# Patient Record
Sex: Female | Born: 1940 | Race: White | Hispanic: No | Smoking: Never smoker
Health system: Southern US, Community
[De-identification: ages and names within clinical notes are randomized; demographics above are authoritative.]

## PROBLEM LIST (undated history)

## (undated) DIAGNOSIS — K449 Diaphragmatic hernia without obstruction or gangrene: Secondary | ICD-10-CM

## (undated) DIAGNOSIS — I214 Non-ST elevation (NSTEMI) myocardial infarction: Secondary | ICD-10-CM

## (undated) DIAGNOSIS — M797 Fibromyalgia: Secondary | ICD-10-CM

## (undated) DIAGNOSIS — G4730 Sleep apnea, unspecified: Secondary | ICD-10-CM

## (undated) DIAGNOSIS — E669 Obesity, unspecified: Secondary | ICD-10-CM

## (undated) DIAGNOSIS — B029 Zoster without complications: Secondary | ICD-10-CM

## (undated) DIAGNOSIS — I509 Heart failure, unspecified: Secondary | ICD-10-CM

## (undated) DIAGNOSIS — D649 Anemia, unspecified: Secondary | ICD-10-CM

## (undated) DIAGNOSIS — J189 Pneumonia, unspecified organism: Secondary | ICD-10-CM

## (undated) DIAGNOSIS — I10 Essential (primary) hypertension: Secondary | ICD-10-CM

## (undated) DIAGNOSIS — I499 Cardiac arrhythmia, unspecified: Secondary | ICD-10-CM

## (undated) DIAGNOSIS — N289 Disorder of kidney and ureter, unspecified: Secondary | ICD-10-CM

## (undated) DIAGNOSIS — E785 Hyperlipidemia, unspecified: Secondary | ICD-10-CM

## (undated) DIAGNOSIS — F419 Anxiety disorder, unspecified: Secondary | ICD-10-CM

## (undated) DIAGNOSIS — K644 Residual hemorrhoidal skin tags: Secondary | ICD-10-CM

## (undated) DIAGNOSIS — G2581 Restless legs syndrome: Secondary | ICD-10-CM

## (undated) DIAGNOSIS — I2510 Atherosclerotic heart disease of native coronary artery without angina pectoris: Secondary | ICD-10-CM

## (undated) HISTORY — DX: Residual hemorrhoidal skin tags: K64.4

## (undated) HISTORY — PX: APPENDECTOMY: SHX54

## (undated) HISTORY — PX: FRACTURE SURGERY: SHX138

## (undated) HISTORY — DX: Cardiac arrhythmia, unspecified: I49.9

## (undated) HISTORY — PX: OTHER SURGICAL HISTORY: SHX169

---

## 1997-09-27 ENCOUNTER — Emergency Department (HOSPITAL_COMMUNITY): Admission: EM | Admit: 1997-09-27 | Discharge: 1997-09-27 | Payer: Self-pay | Admitting: Emergency Medicine

## 1998-04-28 ENCOUNTER — Emergency Department (HOSPITAL_COMMUNITY): Admission: EM | Admit: 1998-04-28 | Discharge: 1998-04-28 | Payer: Self-pay | Admitting: Emergency Medicine

## 1999-12-31 ENCOUNTER — Emergency Department (HOSPITAL_COMMUNITY): Admission: EM | Admit: 1999-12-31 | Discharge: 1999-12-31 | Payer: Self-pay | Admitting: Emergency Medicine

## 1999-12-31 ENCOUNTER — Encounter: Payer: Self-pay | Admitting: Emergency Medicine

## 2000-03-23 ENCOUNTER — Encounter: Payer: Self-pay | Admitting: Emergency Medicine

## 2000-03-23 ENCOUNTER — Emergency Department (HOSPITAL_COMMUNITY): Admission: EM | Admit: 2000-03-23 | Discharge: 2000-03-23 | Payer: Self-pay | Admitting: Emergency Medicine

## 2000-03-28 ENCOUNTER — Ambulatory Visit (HOSPITAL_BASED_OUTPATIENT_CLINIC_OR_DEPARTMENT_OTHER): Admission: RE | Admit: 2000-03-28 | Discharge: 2000-03-28 | Payer: Self-pay | Admitting: Orthopedic Surgery

## 2001-06-03 ENCOUNTER — Emergency Department (HOSPITAL_COMMUNITY): Admission: EM | Admit: 2001-06-03 | Discharge: 2001-06-03 | Payer: Self-pay | Admitting: Emergency Medicine

## 2002-02-05 ENCOUNTER — Inpatient Hospital Stay (HOSPITAL_COMMUNITY): Admission: EM | Admit: 2002-02-05 | Discharge: 2002-02-06 | Payer: Self-pay | Admitting: *Deleted

## 2002-02-06 ENCOUNTER — Encounter: Payer: Self-pay | Admitting: Cardiology

## 2002-04-27 ENCOUNTER — Emergency Department (HOSPITAL_COMMUNITY): Admission: EM | Admit: 2002-04-27 | Discharge: 2002-04-27 | Payer: Self-pay | Admitting: Emergency Medicine

## 2002-07-25 ENCOUNTER — Encounter: Payer: Self-pay | Admitting: Unknown Physician Specialty

## 2002-07-25 ENCOUNTER — Encounter: Payer: Self-pay | Admitting: Family Medicine

## 2002-07-25 ENCOUNTER — Ambulatory Visit (HOSPITAL_COMMUNITY): Admission: RE | Admit: 2002-07-25 | Discharge: 2002-07-25 | Payer: Self-pay | Admitting: Unknown Physician Specialty

## 2002-07-26 ENCOUNTER — Inpatient Hospital Stay (HOSPITAL_COMMUNITY): Admission: EM | Admit: 2002-07-26 | Discharge: 2002-07-28 | Payer: Self-pay | Admitting: Emergency Medicine

## 2002-07-26 ENCOUNTER — Encounter: Payer: Self-pay | Admitting: Emergency Medicine

## 2002-07-28 ENCOUNTER — Encounter: Payer: Self-pay | Admitting: Internal Medicine

## 2003-03-28 ENCOUNTER — Encounter: Payer: Self-pay | Admitting: Emergency Medicine

## 2003-03-28 ENCOUNTER — Emergency Department (HOSPITAL_COMMUNITY): Admission: EM | Admit: 2003-03-28 | Discharge: 2003-03-28 | Payer: Self-pay | Admitting: *Deleted

## 2004-05-03 ENCOUNTER — Ambulatory Visit: Payer: Self-pay | Admitting: Family Medicine

## 2004-06-01 ENCOUNTER — Ambulatory Visit: Payer: Self-pay | Admitting: Family Medicine

## 2004-07-12 ENCOUNTER — Ambulatory Visit: Payer: Self-pay | Admitting: Family Medicine

## 2004-08-04 ENCOUNTER — Ambulatory Visit: Payer: Self-pay | Admitting: Family Medicine

## 2004-10-17 ENCOUNTER — Ambulatory Visit: Payer: Self-pay | Admitting: Family Medicine

## 2005-01-19 ENCOUNTER — Ambulatory Visit: Payer: Self-pay | Admitting: Family Medicine

## 2005-03-28 ENCOUNTER — Ambulatory Visit: Payer: Self-pay | Admitting: Family Medicine

## 2005-04-11 ENCOUNTER — Ambulatory Visit: Payer: Self-pay | Admitting: Family Medicine

## 2005-06-08 ENCOUNTER — Ambulatory Visit: Payer: Self-pay | Admitting: Family Medicine

## 2005-07-04 ENCOUNTER — Ambulatory Visit: Payer: Self-pay | Admitting: Family Medicine

## 2005-08-23 ENCOUNTER — Ambulatory Visit: Payer: Self-pay | Admitting: Family Medicine

## 2005-09-18 ENCOUNTER — Emergency Department (HOSPITAL_COMMUNITY): Admission: EM | Admit: 2005-09-18 | Discharge: 2005-09-18 | Payer: Self-pay | Admitting: Emergency Medicine

## 2005-10-19 ENCOUNTER — Ambulatory Visit: Payer: Self-pay | Admitting: Family Medicine

## 2005-11-02 ENCOUNTER — Ambulatory Visit: Payer: Self-pay | Admitting: Family Medicine

## 2005-11-03 ENCOUNTER — Ambulatory Visit: Payer: Self-pay | Admitting: Family Medicine

## 2006-03-02 ENCOUNTER — Emergency Department (HOSPITAL_COMMUNITY): Admission: EM | Admit: 2006-03-02 | Discharge: 2006-03-02 | Payer: Self-pay | Admitting: Emergency Medicine

## 2006-03-05 ENCOUNTER — Ambulatory Visit: Payer: Self-pay | Admitting: Family Medicine

## 2006-04-04 ENCOUNTER — Ambulatory Visit: Payer: Self-pay | Admitting: Family Medicine

## 2006-04-17 ENCOUNTER — Ambulatory Visit: Payer: Self-pay | Admitting: Family Medicine

## 2006-07-04 ENCOUNTER — Ambulatory Visit: Payer: Self-pay | Admitting: Family Medicine

## 2006-07-10 ENCOUNTER — Ambulatory Visit: Payer: Self-pay | Admitting: Family Medicine

## 2006-10-08 ENCOUNTER — Ambulatory Visit: Payer: Self-pay | Admitting: Family Medicine

## 2006-10-16 ENCOUNTER — Ambulatory Visit: Payer: Self-pay | Admitting: Family Medicine

## 2007-09-09 ENCOUNTER — Ambulatory Visit: Payer: Self-pay | Admitting: Cardiology

## 2007-09-17 ENCOUNTER — Ambulatory Visit: Payer: Self-pay

## 2007-09-17 ENCOUNTER — Ambulatory Visit: Payer: Self-pay | Admitting: Cardiology

## 2007-09-19 ENCOUNTER — Ambulatory Visit: Payer: Self-pay

## 2007-10-23 ENCOUNTER — Ambulatory Visit: Payer: Self-pay | Admitting: Cardiology

## 2009-06-19 ENCOUNTER — Emergency Department (HOSPITAL_COMMUNITY): Admission: EM | Admit: 2009-06-19 | Discharge: 2009-06-19 | Payer: Self-pay | Admitting: Emergency Medicine

## 2010-08-21 LAB — GLUCOSE, CAPILLARY: Glucose-Capillary: 173 mg/dL — ABNORMAL HIGH (ref 70–99)

## 2010-10-18 NOTE — Assessment & Plan Note (Signed)
[facility]                          [facility] OFF[facility] NOTE   NAME:COBBShavy, Beachem                          MRN:          [id number]  DATE:09/09/2007                            DOB:          07-14-1940    PRIMARY CARE PHYSICIAN:  Dr. Ardeen Garland   REASON FOR CONSULTATION:  Evaluate patient with chest pain and an  abnormally EKG.   HISTORY OF PRESENT ILLNESS:  This patient is a pleasant 70 year old  white female without prior cardiac history.  She has significant  cardiovascular risk factors as described below.  She recently saw Dr.  Lysbeth Galas and was noted to have an EKG which was irregular.  There was  baseline artifact.  This makes it somewhat difficult to interpret.  The  computer interpretation was atrial fibrillation.  However, review of  this suggests that there may be some underlying P waves that are  somewhat difficult to see.   The patient says she feels palpitations occasionally, but she cannot be  very specific about this.  She does say that when she climbs up stairs  she will notice her heart racing.  This is not new.  She has no  presyncope or syncope.  She has had no past erythema history.   Of concern, the patient also describes chest and back discomfort.  She  says that this happens when she does activities such as vacuuming.  This  is the most exerting thing she does.  She will get severe discomfort in  her back.  This may last for an hour.  She has to go and sit down until  this goes away.  She may have some left shoulder discomfort.  She has  some fleeting sharp chest pain that is not reproducible with this  activity.  She gets short of breath with this activity.  She is morbidly  obese and has a relatively low activity tolerance.  She does not  describe any PND or orthopnea.  She does not have any associated nausea,  vomiting, or diaphoresis.   PAST MEDICAL HISTORY:  1. Non-insulin-dependent diabetes mellitus x20 years.  2.  Hypertension x20 years.  3. Hypothyroid.  4. Dyslipidemia.   PAST SURGICAL HISTORY:  Appendectomy.   ALLERGIES:  MOTRIN.   MEDICATIONS:  1. Glipizide 10 mg daily.  2. Amitriptyline 10 mg daily.  3. Levothyroxine 25 mcg daily.  4. Lasix 20 mg daily.  5. Januvia 100 mg daily.  6. Avandia 8 mg daily.  7. Lisinopril 20 mg daily.  8. Metformin 1000 mg q. a.m. and q. p.m.  9. Crestor 20 mg q. h.s.  10.Nasacort.  11.Aspirin 81 mg daily.   SOCIAL HISTORY:  The patient comes today with her son.  She has never  smoked cigarettes.  She does not drink alcohol.   FAMILY HISTORY:  Noncontributory for early coronary artery disease.  There is one sister with a skipping heartbeat.   REVIEW OF SYSTEMS:  As stated in the HPI and otherwise positive for  hiatal hernia.  Negative for all other review of systems.   PHYSICAL  EXAMINATION:  Patient is in no acute distress.  Blood pressure 144/82, heart rate 82 and regular, weight 243 pounds.  HEENT:  Eyes unremarkable.  Pupils are equal, round and reactive to  light.  Fundi not visualized.  Oral mucosa unremarkable.  NECK:  No jugular venous distention, 45 degrees.  Carotid upstroke brisk  and symmetrical.  No bruits, thyromegaly.  LYMPHATICS:  No cervical, axillary inguinal adenopathy.  LUNGS:  Clear to auscultation bilaterally.  BACK:  No costovertebral angle tenderness.  CHEST:  Unremarkable.  HEART:  PMI not displaced or sustained.  S1, S2 within normal limits.  No S3, no S4.  No clicks, no rubs, 2 out of 6 apical systolic murmur  slightly radiating at the aortic outflow tract and early peaking.  No  diastolic murmurs.  ABDOMEN:  Morbidly obese.  Positive bowel sounds.  Normal frequency,  pitch.  No bruits, no rebound, no guarding.  No midline pulsatile mass.  No hepatosplenomegaly, splenomegaly.  SKIN:  No rashes, no nodules.  EXTREMITIES:  Two plus pulses throughout.  No edema, no cyanosis, no  clubbing.  NEUROLOGIC:  Oriented to  person, place, time.  Cranial nerves II through  XII are grossly intact.  Motor grossly intact.   EKG:  Sinus rhythm with premature atrial contractions, right bundle-  branch block, no acute ST wave change.   ASSESSMENT AND PLAN:  1. Abnormal electrocardiogram.  The electrocardiogram sent by Dr.      Lysbeth Galas has an irregular rhythm.  However, I do believe there are P      waves and that this is not atrial fibrillation.  There is baseline      artifact which makes it a little bit to interpret.  It is clear on      today's electrocardiogram that she has irregular rhythm with      frequentpremature atrial contractions.  I am going to place a 48      hour Holter monitor to further evaluate for any dysrhythmia before      making any decision about further therapy such as rate control or      anticoagulation.  2. Back discomfort and arm discomfort.  This is quite concerning for      an anginal equivalent in this patient with longstanding      cardiovascular risk factors.  Therefore, she needs a stress test.      She would not be able to walk on a treadmill.  She will need an      adenosine Cardiolite.  Further evaluation based on these results.  3. Hypertension.  Blood pressure is well controlled.  She will      continue the medications as listed.  4. Obesity.  We will discuss further the need to lose weight with diet      and exercise.  5. Diabetes per Dr. Lysbeth Galas.  6. Dyslipidemia.  I think the goal should be an LDL less than 70 and      HDL greater than 50 in this female with significant cardiovascular      risk factors.  She was close to these goals at the last check by      Dr. Lysbeth Galas and is getting excellent primary prevention.  7. Followup.  I will see her back in my office in [place] to follow up      these results.     Rollene Rotunda, MD, [facility]  Electronically Signed    JH/MedQ  DD: 09/09/2007  DT: 09/09/2007  Job #: [id number]   cc:   Delaney Meigs, M.D.

## 2010-10-18 NOTE — Assessment & Plan Note (Signed)
[facility]                            CARDIOLOGY OFFICE NOTE   NAME:[name], [name]                          MRN:          [id number]  DATE:10/23/2007                            DOB:          24-Jun-1940    PRIMARY CARE PHYSICIAN:  Delaney Meigs, M.D.   REASON FOR PRESENTATION:  Evaluate patient with arrhythmia and  hypertension.   HISTORY OF PRESENT ILLNESS:  The patient is a 70 year old white female.  I saw her on September 09, 2007 for evaluation of chest discomfort and  palpitations.  She had a stress perfusion study on September 17, 2007 which  demonstrated no evidence of ischemia or scar with an EF of 76%.  She did  wear a Holter monitor.  This did demonstrate sinus rhythm predominately  with short paroxysms of atrial fibrillation.   The patient says she does still occasionally feel these palpitations,  though they are not sustained.  Of note on her monitor, she also has  frequent premature atrial ectopy and sinus tachycardia.  It is not  exactly clear what she is feeling.  She has had no presyncope or  syncope.  She continues to get this fleeting chest discomfort in her  left upper chest.  This happens sporadically.  It is not associated with  palpitations.  She also complains of some discomfort occasionally in her  left leg in her thigh above her knee.  This again is fleeting and not  associated with walking.  She does not describe any new shortness of  breath and has no PND or orthopnea.   PAST MEDICAL HISTORY:  1. Non-insulin-dependent diabetes mellitus x 20 years.  2. Hypertension x 20 years.  3. Hypothyroidism.  4. Dyslipidemia.  5. Appendectomy.   ALLERGIES/INTOLERANCES:  MOTRIN.   MEDICATIONS:  1. Glipizide 10 mg daily.  2. Amitriptyline 10 mg daily.  3. Levothyroxine 25 mcg daily.  4. Lasix 20 mg daily.  5. Januvia 100 mg daily.  6. Lisinopril 20 mg daily.  7. Metformin 1000 mg q.a.m. and q.p.m.  8. Crestor 20 mg q.h.s.  9.  Nasacort.  10.Aspirin 81 mg daily.  11.Avandia 3 mg daily.  12.Fiber therapy.   REVIEW OF SYSTEMS:  As stated in the HPI and otherwise positive for her  blood glucose dropping occasionally at night.  Negative for all other  systems.   PHYSICAL EXAMINATION:  GENERAL:  The patient is in no distress.  VITAL SIGNS:  Blood pressure 172/74, heart rate 80 and regular, weight  251 pounds, body mass index 45.  HEENT:  Eyelids are unremarkable.  Pupils are equal, round, reactive to  light.  Fundi not visualized.  Oral mucosa unremarkable, edentulous.  NECK:  No jugular venous distention at 45 degrees.  Carotid upstroke  brisk and symmetrical.  No bruits, no thyromegaly.  LYMPHATICS:  No cervical, axillary, inguinal adenopathy.  LUNGS:  Clear to auscultation bilaterally.  BACK:  No costovertebral angle tenderness.  CHEST:  Unremarkable.  HEART:  PMI not displaced or sustained, S1-S2 within normal limits.  No  S3-S4, no clicks, rubs,  2/6 apical systolic murmur slightly radiating  out the aortic outflow tract and early peaking, no diastolic murmurs.  ABDOMEN:  Morbidly obese, positive bowel sounds normal in frequency and  pitch, no bruits, rebound, guarding, no midline pulsatile mass.  No  hepatomegaly.  No splenomegaly.  SKIN:  No rashes, no nodules.  EXTREMITIES:  2+ pulses upper, 2+ femorals, 1+ dorsalis pedis and  posterior tibialis bilaterally, no cyanosis, no clubbing, no edema.  NEURO:  Oriented to person, place, time, cranial nerves II-XII are  grossly intact, motor grossly intact.   ASSESSMENT/PLAN:  1. Chest:  The patient's chest discomfort is somewhat atypical and she      has a negative stress perfusion study.  No further cardiovascular      testing is suggested at this point.  2. Arrhythmia:  The patient did have short runs of paroxysmal atrial      fibrillation.  Her [place] score is 2.  There would be no indication      for Coumadin at this point.  I have asked her to take a full  325 mg      aspirin.  3. Hypertension:  Her blood pressure is still not well controlled.      She needs weight loss and therapeutic lifestyle changes (TLC).  I      am going to increase her lisinopril to 40 mg daily.  She is given      written instructions to get a BMET in 2 weeks.  She does have      follow up with Dr. Lysbeth Galas for management of this.  4. Diabetes:  She is due to see him soon and will discuss the low      blood sugars at night.  5.  Dyslipidemia:  I think the goal should      be an LDL less than 100, HDL greater than 50.  I will defer      management to Dr. Lysbeth Galas.  5. Obesity:  The patient understands the need to lose weight with diet      and exercise.   FOLLOW UP:  I can see the patient back in 1 year or sooner if she has  any further dysrhythmias.  I will need to keep reassessing her for  Coumadin indications.     Rollene Rotunda, MD, [facility]  Electronically Signed    JH/MedQ  DD: 10/23/2007  DT: 10/23/2007  Job #: [id number]   cc:   Delaney Meigs, M.D.

## 2010-10-21 NOTE — Discharge Summary (Signed)
[name], [name]                             ACCOUNT NO.:  [id number]   MEDICAL RECORD NO.:  [id number]                   PATIENT TYPE:  INP   LOCATION:  5511                                 FACILITY:  [facility]   PHYSICIAN:  Margaretmary Bayley, M.D.                 DATE OF BIRTH:  24-Nov-1940   DATE OF ADMISSION:  07/26/2002  DATE OF DISCHARGE:  07/28/2002                                 DISCHARGE SUMMARY   DISCHARGE DIAGNOSES:  1. Insulin-requiring type 2 diabetes mellitus.  2. Hypoglycemia.   REASON FOR ADMISSION:  The patient is a 70 year old type 2 diabetic patient  of Dr. Dewaine Conger, who was seen in the emergency room after being transported by  ambulance because of several episodes of hypoglycemia within the last 24  hours.  The patient had been seen initially in the office for poorly-  controlled type 2 diabetes mellitus, and at that time Lantus insulin had  been added to her regimen of metformin and Glucotrol to improve her diabetic  control.  According to the staff in the emergency room, the patient despite  registering a blood sugar of less than 100 gave herself more Lantus insulin  in response to these findings, and it was felt that she needed to be  admitted for further observation and diabetic education.   PHYSICAL EXAMINATION:  VITAL SIGNS:  Her blood pressure was 142/64 with a  pulse rate of 76, respiratory rate of 20, and she was afebrile at 98.2.  HEENT:  Remarkable for the absence of any significant diabetic changes in  her retina.  The sclerae were anicteric.  No conjunctival pallor.  NECK:  Supple, no adenopathy or thyromegaly.  CHEST:  She had no splinting, tenderness, or deformities, and the lungs were  clear.  CARDIAC:  She did have a right carotid artery bruit.  EXTREMITIES:  There was an abrasion over the left knee anteriorly but no  joint effusions were noted.  NEUROLOGIC:  She was alert and oriented, cooperative, without any focal  deficits.   LABORATORY DATA:   Her EKG revealed a normal sinus rhythm, no deviation of  the electrical axis.  She had a right bundle branch block but no acute  changes.  Her admission chest x-ray revealed mild vascular calcifications in  the area of the coronary arteries and aorta, no focal infiltrates, and no  changes suggestive of pulmonary vascular congestion were noted.   White count was elevated at 10,800 with a hematocrit of 36.  Comprehensive  metabolic panel is normal except for a glucose that was low at 51.  Albumin  is slightly low at 3.1.  TSH is normal at 4.6.  Cortisol is normal at 10.1.  Urinalysis is completely unremarkable except for a glucose that was greater  than 1000 mg%.  A urine culture revealed no significant findings.   HOSPITAL COURSE:  The patient after  being treated with IV D50 en route and  after being seen in the emergency room, she was admitted to a telemetry bed  and started back on her outpatient medications, which included Ecotrin 325  mg per day, Monopril 10 mg per day, Protonix 40 mg per day.  She was not  started back on any oral agents, and CBGs were performed before meals and at  bedtime.  She had no further complications of hypoglycemia.  Her highest  blood sugar was 172 pre-lunch with a fasting of 99.  Because of an abnormal  abdominal ultrasound performed and requested by Dr. Dewaine Conger prior to her  admission, which revealed a question of dilated common ducts, a CT scan of  the abdomen was performed but revealed no abnormalities and specifically  there was no dilated common bile duct.  No masses were noted.  The patient  had no further problems or complications in the hospital and was discharged  after a two-day stay.   DISPOSITION:  She was discharged without any Lantus insulin.  She was  instructed on the use of Glucotrol XL 2.5 mg daily along with metformin 500  mg b.i.d.  She is scheduled to follow up with Dr. Dewaine Conger in one week, and  she is to be seen by me in two weeks.   She is instructed on the importance  of checking her fasting blood sugars daily and to notify either me or Dr.  Dewaine Conger if her blood sugars were less than 80 fasting.  She is discharged on  a 1500 calorie high-fiber complex carbohydrate diet with no added and no  concentrated sweets, and she is to be ambulatory as tolerated.   PROGNOSIS:  Considered to be fair.                                               Margaretmary Bayley, M.D.    PC/MEDQ  D:  09/04/2002  T:  09/05/2002  Job:  [id number]

## 2010-10-21 NOTE — Discharge Summary (Signed)
[name], [name] NO.:  [id number]   MEDICAL RECORD NO.:  [id number]                   PATIENT TYPE:  INP   LOCATION:  2008                                 FACILITY:  [facility]   PHYSICIAN:  [name], P.A. [facility]             DATE OF BIRTH:  [birth year]   DATE OF ADMISSION:  02/05/2002  DATE OF DISCHARGE:  02/06/2002                                 DISCHARGE SUMMARY   REASON. FOR ADMISSION:  Chest pain.   DISCHARGE DIAGNOSES:  1. Chest pain, etiology unclear.  Evaluated this admission with serial     enzymes which were negative and EKG which was not diagnostic for     ischemia.  Adenosine Cardiolite conducted; normal ejection fraction, no     ischemia seen.  2. Diabetes mellitus, marginal control, hemoglobin A1c is 8.9.  3. Cardiac risk factor modification, fasting lipids pending at time of     discharge.  4. Right bundle branch block on EKG for several years.  5. Thyroid dysfunction.  6. Hypertension.  7. Chronic lower back pain.   HISTORY OF PRESENT ILLNESS:  This delightful 70 year old lady was sent to  the [name] by Dr. Dewaine Weeks.  She had been having spells of  chest pain for more than a month.  The spells happen three times a day, are  unrelated to exertion, and also occur while driving.  The pain is felt in  her left precordial area with occasional radiation to the left subscapular  area and into the left shoulder.  Spells last 5-10 minutes and resolve  spontaneously without associated symptoms such as dyspnea, nausea, vomiting,  or diaphoresis.  According to her history, she saw Dr. 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 Dewaine Weeks for a routine  visit and mentioned the spells.  He obtained an EKG which showed inferior ST  changes and instructed her to proceed to the ED.  On arrival there, she was  pain free and  her first set of cardiac enzymes was negative.  Given her risk  factors, we admitted her for further evaluation.   Weeks COURSE:  The patient was admitted and continued on most of her home  medicines.  Aspirin and beta-blocker were added before MI was ruled out.  Her oral hypoglycemics were held in anticipation of a catheterization if  necessary.   The patient had a peaceful night and was bothered only by some fleeting  right shoulder pain.  The following morning, she was taken to adenosine  Cardiolite, which she tolerated very well.  Results of procedure noted  above.   Other labs were drawn and hemogram and chemistries were within normal  limits.  A chest x-ray showed no acute findings; however, the hemoglobin A1c  at 8.9  indicates marginal control of her daily blood sugars.  She does  provide a chart with several weeks worth of these and they do appear to wax  and wane but generally they remain high of target.  A D-dimer was obtained  and this was elevated at 0.93 but given this scenario we did not pursue  workup for pulmonary embolism.  On the day of discharge, Dr. Juanda Weeks saw the  patient and felt that she was appropriate for followup with Dr. Dewaine Weeks.  We  will keep her on aspirin and low-dose pressor.  The patient is also given a  prescription for a supply of Nystatin cream for what appears to be a rash  under bilateral breasts.   PHYSICAL EXAMINATION:  VITAL SIGNS:  On day of discharge, the patient  offered no complaints of chest pain, dyspnea, or palpitations.  Telemetry  reveals normal sinus rhythm; 130/50,59, 28, afebrile.  GENERAL:  NAD.  CARDIOVASCULAR:  Regular rate and rhythm.  S1, S2.  A 1/6 soft systolic  ejection murmur heard best at the right sternal border with some radiation  to the right neck.  LUNGS:  Clear to auscultation.  EXTREMITIES:  No cyanosis, clubbing, or edema.   LABORATORY DATA:  Discharge hemogram:  WBC 6.3, hemoglobin 10.9, hematocrit  32.4,  platelets 218.  Discharge chemistries:  Sodium 141, potassium 3.7, BUN  11, creatinine 0.8, glucose 76.  TSH was 1.7.  Hemoglobin A1c 8.9.  D-dimer  0.93.  Lipids pending at time of discharge.   Chest x-ray:  No acute findings.   DISPOSITION:  The patient is discharged to home in the care of her neighbor  on the following medications.   DISCHARGE MEDICATIONS:  1. Aspirin 325 one q.d.  2. Lopressor 25 mg 1/2 tablet b.i.d.  3. Avandamet 1/500 one q.d.  4. Monopril 10 mg 1 q.d.  5. Nexium 40 one q.d.  6. Unithroid 50 mcg 1 q.d.  7. Sliding scale insulin as before.  The patient is encouraged to consult     with Dr. Dewaine Weeks about better glycemic control.  8. Nystatin cream b.i.d. to rash under breasts.   ACTIVITY:  The patient is urged to exercise for 30 minutes every day.   DIET:  Diet recommended is a diabetic heart healthy diet.   FOLLOW UP:  The patient will follow up with Dr. Dewaine Weeks and with Dr. Tiburcio Weeks  within the next two weeks for her other health problems.  Given her strong  risk factors for coronary artery disease, especially poorly controlled  diabetes, the patient is at high risk for cardiac injury, though no evidence  is found during this admission.  The lipids remain pending and there may be  Weeks for a statin or fiber therapy and weight reduction would certainly be a  plus.  The patient is to call us with any problems, questions, or concerns  and not to hesitate to come back to the emergency department if there is a  recurrence of symptoms.                                               [name], P.A. [facility]    LK/MEDQ  D:  02/06/2002  T:  02/06/2002  Job:  [id number]   cc:   [name]   Dr. Tiburcio Weeks

## 2010-10-21 NOTE — Op Note (Signed)
[place]  Patient:    [name], [name]                            MRN: [id number] Proc. Date: 03/28/00 Adm. Date:  [id number] Attending:  Ronne Binning                           Operative Report  PREOPERATIVE DIAGNOSIS:  Fracture of triquetrum left wrist.  POSTOPERATIVE DIAGNOSIS:  Fracture of triquetrum left wrist.  PROCEDURE:  Arthroscopy with debridement.  Noted scapholunate tear on the carpal abutment.  Triquetral fracture with open repair triquetral fracture.  SURGEON:  Nicki Reaper, M.D.  ASSISTANT:  Joaquin Courts, R.N.  ANESTHESIA: Axillary block.  ANESTHESIOLOGIST:  Guadalupe Maple, M.D.  HISTORY:  The patient is a 70 year old female with a history of a fall, a fracture of her triquetrum.  DESCRIPTION OF PROCEDURE:  The patient was brought to the operating room where an axillary block was carried out without difficulty.  She was prepped and draped using Betadine scrub and solution with the left arm free.  The limb was placed in the arthroscopy tower, 10 pounds of traction applied.  Joint inflated through 3-4 portal.  The hemostat was used to deepen the port. Atransverse incision was made through the skin only.  Blunt trocar was used to enter the joint.  Joint was inspected.  A complete tear of the scapholunate ligament was identified.  This did not appear to be new.  The ulnar aspect of the wrist was inspected.  The triangular fibrocartilage showed a central tear. There was a small tear of the lunar triquetral joint with a large hematoma dorsally.  The volar ulnar and volar radial wrist ligaments were intact.  The articular surfaces looked intact.  The carpal joint was then inspected.  The area avulsion from the triquetrum repaired from the carpal 3-4 portal.  The complete scapholunate ligament tear was immediately apparent.  This was not addressed further.  The scope was introduced in the distal 4-5 portal after a 4-5  portal wasestablished ulnarly for inspection of the ulnar proximal joint.  In the lunate triquetral joint there appeared to be moderate stability to the lunar triquetral joint but instability from the mid carpal joint was noted.  The arthroscopy was aborted at this point in time.  The limb was exsanguinated with an Esmarch bandage.  The tourniquet placed high and the arm was inflated to 250 mmHg.  A hockey stick incision was made in the ulnar aspect and carried down through subcutaneous tissue.  The joint was entered through the 4-5 dorsal retinaculum with sharp dissection.  The area of avulsion of the triquetrum where the dorsal wrist ligament was identified.  The area was debrided.  The bone removed from the ligamentous attachment.  Two, 2-5 statak anchors were placed into the triquetrum and the dorsal radiocarpal ligament was then reinserted into the triquetrum firmly fixing it into position on the two anchors.  X-rays taken.  The AP lateral oblique direction revealed the anchors in good position.  The joint was stable.  The wound was irrigated. The retinaculum closed with figure-of-eight 4-0 Vicryl.  The subcutaneous tissue with 4-0 Vicryl and the skin with interrupted 5-0 nylon suture. Sterile compressive dressing and splint was applied.  The patient tolerated the procedure well and was taken to the recovery room for observation in satisfactory condition.  She  is discharged home to return to [facility] [place] in 1 week on Vicodin and Keflix. DD:  03/28/00 TD:  03/28/00 Job: [id number]

## 2010-10-21 NOTE — H&P (Signed)
NAMEKRESTA, TEMPLEMAN                             ACCOUNT NO.:  [id number]   MEDICAL RECORD NO.:  [id number]                   PATIENT TYPE:  INP   LOCATION:  1828                                 FACILITY:  [facility]   PHYSICIAN:  Everardo Beals. Juanda Chance, M.D. [facility]           DATE OF BIRTH:  1940-10-25   DATE OF ADMISSION:  02/05/2002  DATE OF DISCHARGE:                                HISTORY & PHYSICAL   CHIEF COMPLAINT:  Chest pain.   CLINICAL HISTORY:  The patient is a 70 year old insulin-dependent diabetic  with no prior history of known heart disease.  She has been having chest  pain off and on for the past month, which has become worse in the last three  days.  She describes the pain as substernal pain radiating to the back.  Much of the pain she has is sharp and very fleeting but some of the pain she  has is pressure-like and persistent.  The duller and most persistent pain is  sometimes related to activity.  She saw Dr. Colon Flattery yesterday and her  EKG showed a new right bundle branch block.  He was concerned about her  symptoms and arranged for her to come to the emergency room for evaluation.   She has had no shortness of breath, nausea or diaphoresis.   PAST MEDICAL HISTORY:  Her past medical history is significant for insulin-  dependent diabetes and hypertension.  She also has chronic back pain.   CURRENT MEDICATIONS:  1. Avandia/metformin 1/500 mg b.i.d.  2. Monopril 10 mg q.d.  3. Nexium 40 mg q.d.  4. Unithroid 50 mcg q.d.  5. Avandia, questionable dose.  6. Metamucil b.i.d.  7. Insulin.   SOCIAL HISTORY:  She is on disability for her back and lives in [place] and  has three children.  She does not smoke.   For details of social history, family history and review of systems, please  see Pennelope Bracken, P.A.'s complete note.   PHYSICAL EXAMINATION:  VITAL SIGNS: On examination, the blood pressure is  130/76 and the pulse 69 and regular.  NECK:  There was no venous  distention.  Carotid pulses were full.  There was  a right carotid bruit.  The thyroid was not enlarged.  CHEST:  The chest was clear without rales or rhonchi.  CARDIAC:  Cardiac rhythm was regular.  The first and second heart sounds  were normal.  There are no murmurs, no gallops.  ABDOMEN:  The abdomen was soft with normal bowel sounds.  There was no  hepatosplenomegaly.  EXTREMITIES:  The peripheral pulses were full and there was no peripheral  edema.  MUSCULOSKELETAL:  Musculoskeletal system showed no deformities.  NEUROLOGIC:  Examination showed no focal signs.  Pupils were equal and round  and extraocular movements were full.  Skin was warm and there were no  rashes.   LABORATORY AND ACCESSORY  CLINICAL DATA:  An EKG showed right bundle branch  block.   Her initial CKs and troponins were negative.   IMPRESSION:  1. Chest pain with some atypical and some more typical features of ischemia,     rule out acute coronary syndrome.  2. Insulin-dependent diabetes.  3. Hypertension.   RECOMMENDATIONS:  We will plan to admit the patient for monitoring and  further evaluation.  We will get serial enzymes and electrocardiograms.  If  her cardiograms are negative and she had no recurrent pain, then we will  plan to arrange a rest stress adenosine Cardiolite for tomorrow.  If her  enzymes are positive or if she has recurrent symptoms, we will proceed with  cardiac catheterization.                                                Bruce Elvera Lennox Juanda Chance, M.D. [facility]    BRB/MEDQ  D:  02/05/2002  T:  02/06/2002  Job:  [id number]   cc:   Altamese Dilling, [place]   Cardiopulmonary Lab

## 2011-02-09 ENCOUNTER — Emergency Department (HOSPITAL_COMMUNITY): Payer: Medicare Other

## 2011-02-09 ENCOUNTER — Emergency Department (HOSPITAL_COMMUNITY)
Admission: EM | Admit: 2011-02-09 | Discharge: 2011-02-09 | Disposition: A | Discharge status: Home / Self Care | Payer: Medicare Other | Attending: Emergency Medicine | Admitting: Emergency Medicine

## 2011-02-09 DIAGNOSIS — E039 Hypothyroidism, unspecified: Secondary | ICD-10-CM | POA: Insufficient documentation

## 2011-02-09 DIAGNOSIS — I10 Essential (primary) hypertension: Secondary | ICD-10-CM | POA: Insufficient documentation

## 2011-02-09 DIAGNOSIS — S8010XA Contusion of unspecified lower leg, initial encounter: Secondary | ICD-10-CM | POA: Insufficient documentation

## 2011-02-09 DIAGNOSIS — E119 Type 2 diabetes mellitus without complications: Secondary | ICD-10-CM | POA: Insufficient documentation

## 2011-02-09 DIAGNOSIS — W010XXA Fall on same level from slipping, tripping and stumbling without subsequent striking against object, initial encounter: Secondary | ICD-10-CM | POA: Insufficient documentation

## 2011-04-26 ENCOUNTER — Other Ambulatory Visit: Payer: Self-pay

## 2011-04-26 ENCOUNTER — Inpatient Hospital Stay (HOSPITAL_COMMUNITY)
Admission: EM | Admit: 2011-04-26 | Discharge: 2011-05-06 | DRG: 281 | Disposition: A | Discharge status: Home Health | Payer: Medicare Other | Attending: Cardiology | Admitting: Cardiology

## 2011-04-26 ENCOUNTER — Encounter: Payer: Self-pay | Admitting: Emergency Medicine

## 2011-04-26 ENCOUNTER — Emergency Department (HOSPITAL_COMMUNITY): Payer: Medicare Other

## 2011-04-26 DIAGNOSIS — N289 Disorder of kidney and ureter, unspecified: Secondary | ICD-10-CM

## 2011-04-26 DIAGNOSIS — E119 Type 2 diabetes mellitus without complications: Secondary | ICD-10-CM | POA: Diagnosis present

## 2011-04-26 DIAGNOSIS — R05 Cough: Secondary | ICD-10-CM | POA: Diagnosis present

## 2011-04-26 DIAGNOSIS — I1310 Hypertensive heart and chronic kidney disease without heart failure, with stage 1 through stage 4 chronic kidney disease, or unspecified chronic kidney disease: Secondary | ICD-10-CM | POA: Diagnosis present

## 2011-04-26 DIAGNOSIS — R079 Chest pain, unspecified: Secondary | ICD-10-CM

## 2011-04-26 DIAGNOSIS — I2510 Atherosclerotic heart disease of native coronary artery without angina pectoris: Secondary | ICD-10-CM

## 2011-04-26 DIAGNOSIS — E785 Hyperlipidemia, unspecified: Secondary | ICD-10-CM | POA: Diagnosis present

## 2011-04-26 DIAGNOSIS — I4510 Unspecified right bundle-branch block: Secondary | ICD-10-CM | POA: Diagnosis present

## 2011-04-26 DIAGNOSIS — I214 Non-ST elevation (NSTEMI) myocardial infarction: Secondary | ICD-10-CM | POA: Diagnosis present

## 2011-04-26 DIAGNOSIS — D649 Anemia, unspecified: Secondary | ICD-10-CM | POA: Diagnosis present

## 2011-04-26 DIAGNOSIS — D509 Iron deficiency anemia, unspecified: Secondary | ICD-10-CM | POA: Diagnosis present

## 2011-04-26 DIAGNOSIS — E871 Hypo-osmolality and hyponatremia: Secondary | ICD-10-CM | POA: Diagnosis not present

## 2011-04-26 DIAGNOSIS — I119 Hypertensive heart disease without heart failure: Secondary | ICD-10-CM | POA: Diagnosis present

## 2011-04-26 DIAGNOSIS — N183 Chronic kidney disease, stage 3 unspecified: Secondary | ICD-10-CM | POA: Diagnosis present

## 2011-04-26 HISTORY — DX: Non-ST elevation (NSTEMI) myocardial infarction: I21.4

## 2011-04-26 HISTORY — DX: Restless legs syndrome: G25.81

## 2011-04-26 HISTORY — DX: Fibromyalgia: M79.7

## 2011-04-26 HISTORY — DX: Diaphragmatic hernia without obstruction or gangrene: K44.9

## 2011-04-26 HISTORY — DX: Obesity, unspecified: E66.9

## 2011-04-26 HISTORY — DX: Anxiety disorder, unspecified: F41.9

## 2011-04-26 HISTORY — DX: Atherosclerotic heart disease of native coronary artery without angina pectoris: I25.10

## 2011-04-26 HISTORY — DX: Disorder of kidney and ureter, unspecified: N28.9

## 2011-04-26 HISTORY — DX: Sleep apnea, unspecified: G47.30

## 2011-04-26 HISTORY — DX: Anemia, unspecified: D64.9

## 2011-04-26 HISTORY — DX: Hyperlipidemia, unspecified: E78.5

## 2011-04-26 HISTORY — DX: Essential (primary) hypertension: I10

## 2011-04-26 LAB — HEPATIC FUNCTION PANEL
AST: 23 U/L (ref 0–37)
Albumin: 3.2 g/dL — ABNORMAL LOW (ref 3.5–5.2)
Alkaline Phosphatase: 56 U/L (ref 39–117)
Total Protein: 7.8 g/dL (ref 6.0–8.3)

## 2011-04-26 LAB — POCT I-STAT, CHEM 8
Calcium, Ion: 1.17 mmol/L (ref 1.12–1.32)
Creatinine, Ser: 1.1 mg/dL (ref 0.50–1.10)
Glucose, Bld: 338 mg/dL — ABNORMAL HIGH (ref 70–99)
Glucose, Bld: 326 mg/dL — ABNORMAL HIGH (ref 70–99)
HCT: 33 % — ABNORMAL LOW (ref 36.0–46.0)
HCT: 32 % — ABNORMAL LOW (ref 36.0–46.0)
Hemoglobin: 11.2 g/dL — ABNORMAL LOW (ref 12.0–15.0)
Hemoglobin: 10.9 g/dL — ABNORMAL LOW (ref 12.0–15.0)
TCO2: 22 mmol/L (ref 0–100)
TCO2: 22 mmol/L (ref 0–100)

## 2011-04-26 LAB — CBC
HCT: 30.1 % — ABNORMAL LOW (ref 36.0–46.0)
Hemoglobin: 9.5 g/dL — ABNORMAL LOW (ref 12.0–15.0)
MCV: 87.8 fL (ref 78.0–100.0)
RBC: 3.43 MIL/uL — ABNORMAL LOW (ref 3.87–5.11)
RDW: 14.1 % (ref 11.5–15.5)
WBC: 8.9 10*3/uL (ref 4.0–10.5)

## 2011-04-26 LAB — URINALYSIS, ROUTINE W REFLEX MICROSCOPIC
Glucose, UA: >1000 mg/dL — AB
Ketones, ur: 15 mg/dL — AB
Leukocytes, UA: NEGATIVE
pH: 5 (ref 5.0–8.0)

## 2011-04-26 LAB — URINE MICROSCOPIC-ADD ON

## 2011-04-26 MED ORDER — ACETAMINOPHEN 325 MG PO TABS
650.0000 mg | ORAL_TABLET | Freq: Once | ORAL | Status: AC
  Administered 2011-04-26: 650 mg via ORAL
  Filled 2011-04-26: qty 2

## 2011-04-26 MED ORDER — FENTANYL CITRATE 0.05 MG/ML IJ SOLN
50.0000 ug | Freq: Once | INTRAMUSCULAR | Status: AC
  Administered 2011-04-26: 50 ug via INTRAVENOUS
  Filled 2011-04-26: qty 2

## 2011-04-26 MED ORDER — ACETAMINOPHEN 500 MG PO TABS: 1000.0000 mg | ORAL_TABLET | Freq: Once | ORAL | Status: DC

## 2011-04-26 MED ORDER — ASPIRIN 81 MG PO CHEW: 324.0000 mg | CHEWABLE_TABLET | ORAL | Status: AC

## 2011-04-26 MED ORDER — GLIMEPIRIDE 4 MG PO TABS
4.0000 mg | ORAL_TABLET | Freq: Two times a day (BID) | ORAL | Status: DC
  Administered 2011-04-27 – 2011-05-06 (×17): 4 mg via ORAL
  Filled 2011-04-26 (×21): qty 1

## 2011-04-26 MED ORDER — ACETAMINOPHEN 325 MG PO TABS
ORAL_TABLET | ORAL | Status: AC
  Administered 2011-04-26: 975 mg
  Filled 2011-04-26: qty 3

## 2011-04-26 MED ORDER — ROSUVASTATIN CALCIUM 20 MG PO TABS
20.0000 mg | ORAL_TABLET | Freq: Every day | ORAL | Status: DC
  Administered 2011-04-27: 20 mg via ORAL
  Filled 2011-04-26 (×3): qty 1

## 2011-04-26 MED ORDER — ASPIRIN 300 MG RE SUPP: 300.0000 mg | RECTAL | Status: DC

## 2011-04-26 MED ORDER — INSULIN ASPART 100 UNIT/ML ~~LOC~~ SOLN
0.0000 [IU] | Freq: Every day | SUBCUTANEOUS | Status: DC
  Filled 2011-04-26 (×6): qty 3

## 2011-04-26 MED ORDER — ACETAMINOPHEN 325 MG PO TABS: 650.0000 mg | ORAL_TABLET | Freq: Four times a day (QID) | ORAL | Status: DC | PRN

## 2011-04-26 MED ORDER — SODIUM CHLORIDE 0.9 % IV SOLN
Freq: Once | INTRAVENOUS | Status: AC
  Administered 2011-04-26: 16:00:00 via INTRAVENOUS

## 2011-04-26 MED ORDER — INSULIN ASPART 100 UNIT/ML ~~LOC~~ SOLN
10.0000 [IU] | Freq: Once | SUBCUTANEOUS | Status: AC
  Administered 2011-04-26: 10 [IU] via SUBCUTANEOUS

## 2011-04-26 MED ORDER — PREGABALIN 50 MG PO CAPS
50.0000 mg | ORAL_CAPSULE | Freq: Every day | ORAL | Status: DC
  Administered 2011-04-26 – 2011-05-05 (×10): 50 mg via ORAL
  Filled 2011-04-26: qty 2
  Filled 2011-04-26 (×5): qty 1
  Filled 2011-04-26: qty 2
  Filled 2011-04-26 (×2): qty 1

## 2011-04-26 MED ORDER — ACETAMINOPHEN 325 MG PO TABS
650.0000 mg | ORAL_TABLET | ORAL | Status: DC | PRN
  Administered 2011-04-27 (×2): 650 mg via ORAL
  Administered 2011-04-27 – 2011-04-28 (×2): 325 mg via ORAL
  Administered 2011-04-28 – 2011-05-02 (×10): 650 mg via ORAL
  Filled 2011-04-26 (×13): qty 2

## 2011-04-26 MED ORDER — LINAGLIPTIN 5 MG PO TABS
5.0000 mg | ORAL_TABLET | Freq: Every day | ORAL | Status: DC
  Administered 2011-04-27 – 2011-05-06 (×9): 5 mg via ORAL
  Filled 2011-04-26 (×11): qty 1

## 2011-04-26 MED ORDER — LISINOPRIL 5 MG PO TABS
5.0000 mg | ORAL_TABLET | Freq: Every day | ORAL | Status: DC
  Administered 2011-04-27 – 2011-05-06 (×10): 5 mg via ORAL
  Filled 2011-04-26 (×11): qty 1

## 2011-04-26 MED ORDER — INSULIN ASPART 100 UNIT/ML ~~LOC~~ SOLN
0.0000 [IU] | Freq: Three times a day (TID) | SUBCUTANEOUS | Status: DC
  Administered 2011-04-27: 8 [IU] via SUBCUTANEOUS
  Administered 2011-04-27: 3 [IU] via SUBCUTANEOUS
  Administered 2011-04-27: 8 [IU] via SUBCUTANEOUS
  Administered 2011-04-28: 3 [IU] via SUBCUTANEOUS
  Administered 2011-04-29: 2 [IU] via SUBCUTANEOUS
  Administered 2011-04-30: 5 [IU] via SUBCUTANEOUS
  Administered 2011-05-01: 2 [IU] via SUBCUTANEOUS
  Administered 2011-05-02: 3 [IU] via SUBCUTANEOUS
  Administered 2011-05-02: 5 [IU] via SUBCUTANEOUS
  Administered 2011-05-03 (×2): 2 [IU] via SUBCUTANEOUS
  Administered 2011-05-04 (×2): 3 [IU] via SUBCUTANEOUS
  Administered 2011-05-04: 2 [IU] via SUBCUTANEOUS
  Administered 2011-05-05 – 2011-05-06 (×3): 3 [IU] via SUBCUTANEOUS
  Filled 2011-04-26 (×2): qty 3
  Filled 2011-04-26: qty 1
  Filled 2011-04-26 (×2): qty 3

## 2011-04-26 MED ORDER — ASPIRIN EC 81 MG PO TBEC
81.0000 mg | DELAYED_RELEASE_TABLET | Freq: Every day | ORAL | Status: DC
  Administered 2011-04-27: 81 mg via ORAL
  Filled 2011-04-26 (×2): qty 1

## 2011-04-26 MED ORDER — DEXTROMETHORPHAN-GUAIFENESIN 10-200 MG/5ML PO LIQD: 5.0000 mL | ORAL | Status: DC | PRN

## 2011-04-26 NOTE — H&P (Signed)
H&P  Patient ID: [name] MRN: [id number] DOB/AGE: 12-22-1940 70 y.o.  Admit date: 04/26/2011 Referring Physician   April Smitty Cords, MD Primary Physician   [facility]  Primary Cardiologist   [place] Reason for Consultation   Chest Pain  WUJ:WJXB D Toothaker is a 70 y.o. female with no history of CAD.   She has been having chest pain off and on for a long time. The pain is with and without exertion and each episode is of unknown duration. She has SOB with exertion and may have some with the chest pain. The pain goes through to her back and reaches a 5/10. There is no N&V or diaphoresis. Her family brought her in tonight for assessment. She has also been weak. She has multiple MS aches/pains. She has been constipated recently but not any more. She has been coughing, but it is non-productive. She has had no fevers/chills. She denies reflux or melena. She is unsure how many episodes she has had. She told her physicians about it over a month ago, so it has been going on a long time.     Past Medical History  Diagnosis Date  . Diabetes mellitus   . Hypertension   . Hyperlipemia   . Fibromyalgia   . Obesity     stress perfusion study on September 17, 2007 which   demonstrated no evidence of ischemia or scar with an EF of 76%.       Holter monitor.  This did demonstrate sinus rhythm predominately   with short paroxysms of atrial fibrillation.     Hypothyroid.      Surgical history: Appendectomy, Left wrist repair.   Allergies  Allergen Reactions  . Codeine Other (See Comments)    Childhood allergy   I have reviewed the patient's current medications. Prior to Admission:  Current facility-administered medications:0.9 %  sodium chloride infusion, , Intravenous, Once, April K Palumbo-Rasch, MD, Last Rate: 50 mL/hr at 04/26/11 1549;  acetaminophen (TYLENOL) 325 MG tablet, , , , , 975 mg at 04/26/11 1618;  fentaNYL (SUBLIMAZE) injection 50 mcg, 50 mcg, Intravenous, Once, April K Palumbo-Rasch, MD,  50 mcg at 04/26/11 1548;  DISCONTD: acetaminophen (TYLENOL) tablet 1,000 mg, 1,000 mg, Oral, Once, April K Palumbo-Rasch, MD  Current outpatient prescriptions: aspirin EC 81 MG tablet,   Dextromethorphan-Guaifenesin (DIABETIC TUSSIN MAX ST) 10-200 MG/5ML LIQD,  ;  furosemide (LASIX) 20 MG tablet, Take 20 mg by mouth daily.    glimepiride (AMARYL) 4 MG tablet, Take 4 mg by mouth 2 (two) times daily.   lisinopril (PRINIVIL,ZESTRIL) 5 MG tablet, Take 5 mg by mouth daily.    metFORMIN (GLUCOPHAGE) 1000 MG tablet, Take 1,000 mg by mouth 2 (two) times daily with a meal.  pregabalin (LYRICA) 50 MG capsule, Take 50 mg by mouth at bedtime.   promethazine-dextromethorphan (PROMETHAZINE-DM) 6.25-15 MG/5ML syrup, Take 10 mLs by mouth 4 (four) times daily as needed. For cough , rosuvastatin (CRESTOR) 20 MG tablet, Take 20 mg by mouth at bedtime.   sitaGLIPtin (JANUVIA) 100 MG tablet, Take 100 mg by mouth daily.    History   Social History  . Marital Status: Divorced    Spouse Name: N/A    Number of Children: N/A  . Years of Education: N/A   Occupational History  . disabled   Social History Main Topics  . Smoking status: Never Smoker   . Smokeless tobacco: Never Used  . Alcohol Use: No  . Drug Use: No  . Sexually Active:  Other Topics Concern  . Not on file   Social History Narrative  . No narrative on file    History reviewed. No pertinent family history.   Full 14 point review of systems complete and found to be negative unless listed  above  Physical Exam: Blood pressure 155/65, pulse 80, temperature 99.1 F (37.3 C), temperature source Oral, resp. rate 20, SpO2 99.00%.   General: Well developed, obese white female, in no acute distress Head: Eyes PERRLA, No xanthomas.   Normocephalic and atraumatic, oropharynx without edema or exudate, dentition is poor Lungs: Clear bilaterally to auscultation and percussion. Poor inspiratory effort because she says it hurts to breathe  deeply. Heart: HRRR S1 S2, without M/R/G.  Pulses are 2+  all 4 extrem, slightly decreased right radial.   Neck:  No carotid bruit. No JVD. No lymphadenopathy Abdomen: Bowel sounds present, abdomen soft and non-tender without masses or hernias noted. Msk:  No spine or cva tenderness. no joint deformities or effusions. Extremities: No clubbing, cyanosis or edema.  Neuro: Alert and oriented X 3. No focal deficits noted. Psych: responds appropriately, seems slightly anxious Skin :  No rashes or lesions noted.  Labs: Sodium  Date/Time Value Range Status  04/26/2011  5:11 PM 136  135-145 (mEq/L) Final     Potassium  Date/Time Value Range Status  04/26/2011  5:11 PM 4.1  3.5-5.1 (mEq/L) Final     Chloride  Date/Time Value Range Status  04/26/2011  5:11 PM 104  96-112 (mEq/L) Final     Glucose, Bld  Date/Time Value Range Status  04/26/2011  5:11 PM 326* 70-99 (mg/dL) Final     BUN  Date/Time Value Range Status  04/26/2011  5:11 PM 23  6-23 (mg/dL) Final     Creatinine, Ser  Date/Time Value Range Status  04/26/2011  5:11 PM 1.30* 0.50-1.10 (mg/dL) Final   AST  Date/Time Value Range Status  04/26/2011  3:15 PM 23  0-37 (U/L) Final     ALT  Date/Time Value Range Status  04/26/2011  3:15 PM 16  0-35 (U/L) Final     Alkaline Phosphatase  Date/Time Value Range Status  04/26/2011  3:15 PM 56  39-117 (U/L) Final     Total Bilirubin  Date/Time Value Range Status  04/26/2011  3:15 PM 0.3  0.3-1.2 (mg/dL) Final     Total Protein  Date/Time Value Range Status  04/26/2011  3:15 PM 7.8  6.0-8.3 (g/dL) Final     Albumin  Date/Time Value Range Status  04/26/2011  3:15 PM 3.2* 3.5-5.2 (g/dL) Final   Lipase  Date/Time Value Range Status  04/26/2011  3:15 PM 33  11-59 (U/L) Final   WBC  Date/Time Value Range Status  04/26/2011  3:15 PM 8.9  4.0-10.5 (K/uL) Final     Hemoglobin  Date/Time Value Range Status  04/26/2011  5:11 PM 10.9* 12.0-15.0 (g/dL) Final      HCT  Date/Time Value Range Status  04/26/2011  5:11 PM 32.0* 36.0-46.0 (%) Final     MCV  Date/Time Value Range Status  04/26/2011  3:15 PM 87.8  78.0-100.0 (fL) Final     Platelets  Date/Time Value Range Status  04/26/2011  3:15 PM 140* 150-400 (K/uL) Final   No results found for this basename: cktotal, CKMB, ckmbindex, troponini    No results found for this basename: tsh, t4total, freet3, t8free, thyroidab   No results found for this basename: VitaminB12, Folate, Ferritin, TIBC, Iron, reticctpct     Radiology:  2  view CXR IMPRESSION: Cardiomegaly and pulmonary vascular congestion.   EKG:  SR, RBBB is old  ASSESSMENT AND PLAN:   The patient was seen today by Brenda Flurry, MD, the patient evaluated and the data reviewed. She has atypical pain but multiple cardiac risk factors and an elevated troponin. We will admit, r/o MI, add heparin, continue home Rx but no metformin, check CRFs. Consider cath to r/o CAD as cause of Sx.  Signed: Theodore Weeks 04/26/2011, 7:36 PM Co-Sign MD  Attending Note: I examined patient and took her history.  Agree with assessment of [name].  Patient history is not classic for angina but she has multiple risk factors including poorly controlled diabetes, dyslipidemia and hypertension. She came to ER today because of left-sided chest pain with ? Radiation to her back, associated with marked weakness.  Initial POC troponin is slightly elevated.  Chest xray shows cardiomegaly and mild pulmonary vascular congestion. Examination reveals clear lungs without wheezes or rales.  Heart reveals no gallop. Our plan will be to admit to monitored bed, obtain serial enzymes and EKGs, continue statin and aspirin and add low dose coreg for beta blocker. IV heparin. Probable cath on Friday.  Metformin on hold.  Follow BMET.  Obtain anemia panel--patient denies prior knowledge of anemia.  [name]

## 2011-04-26 NOTE — ED Notes (Signed)
Pt presents from home. She lives alone. She reported to ems that she has been having abdominal pain with fever and generalized malaise. ems reported that she had a fever of 101 upon arrival. No meds pta. Alert and oriented.

## 2011-04-26 NOTE — ED Notes (Signed)
C/o lower abd pain few days. Denies n/v/d, last BM Sunday. Also c/o generalized malaise, weakness, fever, chills. Tender to palpation. NAD

## 2011-04-26 NOTE — ED Notes (Signed)
[facility] card called with tropinin and CBG results [facility]er for 10 units novolog sub q obtained heparin to be started on floor

## 2011-04-26 NOTE — ED Notes (Signed)
Assisted to bathroom via wheelchair for urine sample

## 2011-04-26 NOTE — ED Provider Notes (Signed)
History     CSN: [id number] Arrival date & time: 04/26/2011  1:51 PM   First MD Initiated Contact with Patient 04/26/11 1459      Chief Complaint  Patient presents with  . Abdominal Pain  . Fever    (Consider location/radiation/quality/duration/timing/severity/associated sxs/prior treatment) Patient is a 70 y.o. female presenting with abdominal pain and fever. The history is provided by the patient. No language interpreter was used.  Abdominal Pain The primary symptoms of the illness include abdominal pain and shortness of breath. The primary symptoms of the illness do not include nausea, vomiting, diarrhea, hematochezia or dysuria. The current episode started more than 2 days ago. The onset of the illness was gradual. The problem has not changed since onset. The abdominal pain is located in the periumbilical region. The abdominal pain does not radiate. The severity of the abdominal pain is 8/10. The abdominal pain is relieved by nothing. Exacerbated by: nothing.  The shortness of breath began more than 2 days ago. The shortness of breath developed gradually. The shortness of breath is moderate. The patient's medical history is significant for CHF.  Associated with: nothing. The patient has not had a change in bowel habit. Risk factors for an acute abdominal problem include being elderly. Symptoms associated with the illness do not include chills, anorexia, diaphoresis, heartburn, constipation, urgency, hematuria or frequency. Significant associated medical issues include diabetes. Significant associated medical issues do not include inflammatory bowel disease or HIV.  Fever Primary symptoms of the febrile illness include cough, wheezing, shortness of breath and abdominal pain. Primary symptoms do not include nausea, vomiting, diarrhea, dysuria or arthralgias.  The patient's medical history is significant for CHF.    Past Medical History  Diagnosis Date  . Diabetes mellitus   .  Hypertension   . Hyperlipemia   . Fibromyalgia   . Obesity     No past surgical history on file.  No family history on file.  History  Substance Use Topics  . Smoking status: Never Smoker   . Smokeless tobacco: Never Used  . Alcohol Use: No    OB History    Grav Para Term Preterm Abortions TAB SAB Ect Mult Living                  Review of Systems  Constitutional: Negative for chills and diaphoresis.  HENT: Negative for facial swelling.   Eyes: Negative for discharge.  Respiratory: Positive for cough, shortness of breath and wheezing.   Cardiovascular: Negative for chest pain and leg swelling.  Gastrointestinal: Positive for abdominal pain. Negative for heartburn, nausea, vomiting, diarrhea, constipation, hematochezia, abdominal distention and anorexia.  Genitourinary: Negative for dysuria, urgency, frequency and hematuria.  Musculoskeletal: Negative for arthralgias.  Skin: Negative.   Neurological: Negative for seizures, speech difficulty, light-headedness and numbness.  Hematological: Negative.   Psychiatric/Behavioral: Negative.     Allergies  Codeine  Home Medications   Current Outpatient Rx  Name Route Sig Dispense Refill  . ASPIRIN EC 81 MG PO TBEC Oral Take 81 mg by mouth daily.      Marland Kitchen DEXTROMETHORPHAN-GUAIFENESIN 10-200 MG/5ML PO LIQD Oral Take 5 mLs by mouth every 2 (two) hours as needed. For cough     . FUROSEMIDE 20 MG PO TABS Oral Take 20 mg by mouth daily.      Marland Kitchen GLIMEPIRIDE 4 MG PO TABS Oral Take 4 mg by mouth 2 (two) times daily.      Marland Kitchen LISINOPRIL 5 MG PO TABS Oral Take  5 mg by mouth daily.      Marland Kitchen METFORMIN HCL 1000 MG PO TABS Oral Take 1,000 mg by mouth 2 (two) times daily with a meal.      . PREGABALIN 50 MG PO CAPS Oral Take 50 mg by mouth at bedtime.      Marland Kitchen PROMETHAZINE-DM 6.25-15 MG/5ML PO SYRP Oral Take 10 mLs by mouth 4 (four) times daily as needed. For cough     . ROSUVASTATIN CALCIUM 20 MG PO TABS Oral Take 20 mg by mouth at bedtime.        Marland Kitchen SITAGLIPTIN PHOSPHATE 100 MG PO TABS Oral Take 100 mg by mouth daily.        BP 138/73  Pulse 92  Temp(Src) 97.5 F (36.4 C) (Oral)  Resp 24  SpO2 93%  Physical Exam  Constitutional: She is oriented to person, place, and time. She appears well-developed and well-nourished. No distress.  HENT:  Head: Normocephalic and atraumatic.  Right Ear: Tympanic membrane normal. Tympanic membrane is not injected.  Left Ear: Tympanic membrane normal. Tympanic membrane is not injected.  Mouth/Throat: Oropharynx is clear and moist.  Eyes: Conjunctivae are normal. Pupils are equal, round, and reactive to light.  Neck: Normal range of motion. Neck supple. No JVD present.  Cardiovascular: Normal rate.  Exam reveals no friction rub.   Pulmonary/Chest: She has wheezes. She has no rales.  Abdominal: Soft. Bowel sounds are normal. She exhibits no mass. There is no rebound and no guarding.  Musculoskeletal: Normal range of motion.  Lymphadenopathy:    She has no cervical adenopathy.  Neurological: She is alert and oriented to person, place, and time.  Skin: Skin is warm and dry.  Psychiatric: Thought content normal.    ED Course  Procedures (including critical care time)   Labs Reviewed  CBC  I-STAT TROPONIN I  I-STAT TROPONIN I  LIPASE, BLOOD  HEPATIC FUNCTION PANEL  URINALYSIS, ROUTINE W REFLEX MICROSCOPIC  URINE CULTURE   No results found.   No diagnosis found.    MDM   Date: 04/26/2011  Rate: 93  Rhythm: normal sinus rhythm  QRS Axis: normal  Intervals: normal  ST/T Wave abnormalities: normal  Conduction Disutrbances:right bundle branch block  Narrative Interpretation:   Old EKG Reviewed: none available          Khiana Camino K Damany Eastman-Rasch, MD 04/26/11 Ernestina Columbia

## 2011-04-26 NOTE — ED Notes (Signed)
Patient transported to X-ray 

## 2011-04-27 ENCOUNTER — Inpatient Hospital Stay (HOSPITAL_COMMUNITY): Payer: Medicare Other

## 2011-04-27 ENCOUNTER — Encounter (HOSPITAL_COMMUNITY): Payer: Self-pay

## 2011-04-27 ENCOUNTER — Other Ambulatory Visit: Payer: Self-pay

## 2011-04-27 DIAGNOSIS — R05 Cough: Secondary | ICD-10-CM | POA: Diagnosis present

## 2011-04-27 DIAGNOSIS — E785 Hyperlipidemia, unspecified: Secondary | ICD-10-CM | POA: Diagnosis present

## 2011-04-27 DIAGNOSIS — I214 Non-ST elevation (NSTEMI) myocardial infarction: Secondary | ICD-10-CM

## 2011-04-27 DIAGNOSIS — E119 Type 2 diabetes mellitus without complications: Secondary | ICD-10-CM | POA: Diagnosis present

## 2011-04-27 DIAGNOSIS — D649 Anemia, unspecified: Secondary | ICD-10-CM | POA: Diagnosis present

## 2011-04-27 DIAGNOSIS — I119 Hypertensive heart disease without heart failure: Secondary | ICD-10-CM | POA: Diagnosis present

## 2011-04-27 HISTORY — DX: Non-ST elevation (NSTEMI) myocardial infarction: I21.4

## 2011-04-27 HISTORY — DX: Anemia, unspecified: D64.9

## 2011-04-27 LAB — BASIC METABOLIC PANEL
BUN: 23 mg/dL (ref 6–23)
BUN: 26 mg/dL — ABNORMAL HIGH (ref 6–23)
CO2: 22 mEq/L (ref 19–32)
Calcium: 8.7 mg/dL (ref 8.4–10.5)
Chloride: 100 mEq/L (ref 96–112)
Chloride: 98 mEq/L (ref 96–112)
Creatinine, Ser: 1.14 mg/dL — ABNORMAL HIGH (ref 0.50–1.10)
Creatinine, Ser: 1.35 mg/dL — ABNORMAL HIGH (ref 0.50–1.10)
GFR calc Af Amer: 56 mL/min — ABNORMAL LOW (ref >90)
GFR calc non Af Amer: 48 mL/min — ABNORMAL LOW (ref >90)
Glucose, Bld: 193 mg/dL — ABNORMAL HIGH (ref 70–99)
Potassium: 3.9 mEq/L (ref 3.5–5.1)

## 2011-04-27 LAB — IRON AND TIBC: Iron: <10 ug/dL — ABNORMAL LOW (ref 42–135)

## 2011-04-27 LAB — URINE CULTURE
Colony Count: NO GROWTH
Culture  Setup Time: 201211212106
Culture: NO GROWTH

## 2011-04-27 LAB — CBC
HCT: 27.3 % — ABNORMAL LOW (ref 36.0–46.0)
MCH: 28 pg (ref 26.0–34.0)
MCV: 86.9 fL (ref 78.0–100.0)
RBC: 3.14 MIL/uL — ABNORMAL LOW (ref 3.87–5.11)
WBC: 8.1 10*3/uL (ref 4.0–10.5)

## 2011-04-27 LAB — MAGNESIUM: Magnesium: 1.8 mg/dL (ref 1.5–2.5)

## 2011-04-27 LAB — CARDIAC PANEL(CRET KIN+CKTOT+MB+TROPI)
CK, MB: 7.9 ng/mL (ref 0.3–4.0)
Relative Index: 0.6 (ref 0.0–2.5)
Relative Index: 0.3 (ref 0.0–2.5)
Total CK: 1284 U/L — ABNORMAL HIGH (ref 7–177)
Total CK: 1458 U/L — ABNORMAL HIGH (ref 7–177)

## 2011-04-27 LAB — HEMOGLOBIN A1C: Hgb A1c MFr Bld: 6.8 % — ABNORMAL HIGH (ref <5.7)

## 2011-04-27 LAB — GLUCOSE, CAPILLARY
Glucose-Capillary: 108 mg/dL — ABNORMAL HIGH (ref 70–99)
Glucose-Capillary: 193 mg/dL — ABNORMAL HIGH (ref 70–99)

## 2011-04-27 LAB — APTT: aPTT: 30 seconds (ref 24–37)

## 2011-04-27 LAB — FERRITIN: Ferritin: 144 ng/mL (ref 10–291)

## 2011-04-27 LAB — PROTIME-INR
INR: 1.26 (ref 0.00–1.49)
Prothrombin Time: 16.1 seconds — ABNORMAL HIGH (ref 11.6–15.2)

## 2011-04-27 LAB — HEPARIN LEVEL (UNFRACTIONATED): Heparin Unfractionated: 0.12 IU/mL — ABNORMAL LOW (ref 0.30–0.70)

## 2011-04-27 LAB — TSH: TSH: 0.892 u[IU]/mL (ref 0.350–4.500)

## 2011-04-27 LAB — LIPID PANEL
HDL: 62 mg/dL (ref >39)
Total CHOL/HDL Ratio: 1.8 RATIO

## 2011-04-27 MED ORDER — SODIUM CHLORIDE 0.9 % IJ SOLN
3.0000 mL | Freq: Two times a day (BID) | INTRAMUSCULAR | Status: DC
  Administered 2011-04-27 – 2011-04-28 (×3): 3 mL via INTRAVENOUS

## 2011-04-27 MED ORDER — BENZONATATE 100 MG PO CAPS
100.0000 mg | ORAL_CAPSULE | Freq: Once | ORAL | Status: AC
  Administered 2011-04-27: 100 mg via ORAL
  Filled 2011-04-27: qty 1

## 2011-04-27 MED ORDER — ACETAMINOPHEN 325 MG PO TABS: 650.0000 mg | ORAL_TABLET | ORAL | Status: DC | PRN

## 2011-04-27 MED ORDER — ASPIRIN EC 81 MG PO TBEC: 81.0000 mg | DELAYED_RELEASE_TABLET | Freq: Every day | ORAL | Status: DC

## 2011-04-27 MED ORDER — ZOLPIDEM TARTRATE 5 MG PO TABS
5.0000 mg | ORAL_TABLET | Freq: Every evening | ORAL | Status: DC | PRN
  Administered 2011-05-04: 5 mg via ORAL
  Filled 2011-04-27: qty 1

## 2011-04-27 MED ORDER — HEPARIN (PORCINE) IN NACL 100-0.45 UNIT/ML-% IJ SOLN
1400.0000 [IU]/h | INTRAMUSCULAR | Status: DC
  Administered 2011-04-27: 1300 [IU]/h via INTRAVENOUS
  Administered 2011-04-27: 1050 [IU]/h via INTRAVENOUS
  Administered 2011-04-27: 1300 [IU]/h via INTRAVENOUS
  Filled 2011-04-27 (×4): qty 250

## 2011-04-27 MED ORDER — SODIUM CHLORIDE 0.9 % IV SOLN
250.0000 mL | INTRAVENOUS | Status: DC
  Administered 2011-04-27: 250 mL via INTRAVENOUS

## 2011-04-27 MED ORDER — SODIUM CHLORIDE 0.9 % IJ SOLN: 3.0000 mL | INTRAMUSCULAR | Status: DC | PRN

## 2011-04-27 MED ORDER — HEPARIN BOLUS VIA INFUSION
2000.0000 [IU] | Freq: Once | INTRAVENOUS | Status: AC
  Administered 2011-04-27: 2000 [IU] via INTRAVENOUS
  Filled 2011-04-27: qty 2000

## 2011-04-27 MED ORDER — DEXTROMETHORPHAN-GUAIFENESIN 10-200 MG/5ML PO LIQD: 5.0000 mL | ORAL | Status: DC | PRN

## 2011-04-27 MED ORDER — EPTIFIBATIDE BOLUS VIA INFUSION
180.0000 ug/kg | Freq: Once | INTRAVENOUS | Status: AC
  Administered 2011-04-27: 19000 ug via INTRAVENOUS
  Filled 2011-04-27: qty 26

## 2011-04-27 MED ORDER — SODIUM CHLORIDE 0.9 % IV SOLN
INTRAVENOUS | Status: DC
  Administered 2011-04-28 (×2): via INTRAVENOUS

## 2011-04-27 MED ORDER — ONDANSETRON HCL 4 MG/2ML IJ SOLN
4.0000 mg | Freq: Four times a day (QID) | INTRAMUSCULAR | Status: DC | PRN
  Filled 2011-04-27: qty 2

## 2011-04-27 MED ORDER — AZITHROMYCIN 500 MG PO TABS
500.0000 mg | ORAL_TABLET | Freq: Every day | ORAL | Status: AC
  Administered 2011-04-27: 500 mg via ORAL
  Filled 2011-04-27: qty 1

## 2011-04-27 MED ORDER — GUAIFENESIN ER 600 MG PO TB12
600.0000 mg | ORAL_TABLET | Freq: Two times a day (BID) | ORAL | Status: DC
  Administered 2011-04-27 – 2011-05-06 (×19): 600 mg via ORAL
  Filled 2011-04-27 (×21): qty 1

## 2011-04-27 MED ORDER — DIAZEPAM 5 MG PO TABS: 5.0000 mg | ORAL_TABLET | ORAL | Status: AC

## 2011-04-27 MED ORDER — NITROGLYCERIN IN D5W 200-5 MCG/ML-% IV SOLN
2.0000 ug/min | INTRAVENOUS | Status: DC
  Administered 2011-04-27: 5 ug/min via INTRAVENOUS
  Filled 2011-04-27: qty 250

## 2011-04-27 MED ORDER — HEPARIN BOLUS VIA INFUSION
4000.0000 [IU] | Freq: Once | INTRAVENOUS | Status: AC
  Administered 2011-04-27: 4000 [IU] via INTRAVENOUS
  Filled 2011-04-27: qty 4000

## 2011-04-27 MED ORDER — EPTIFIBATIDE 75 MG/100ML IV SOLN
2.0000 ug/kg/min | INTRAVENOUS | Status: DC
  Administered 2011-04-27 – 2011-04-28 (×5): 2 ug/kg/min via INTRAVENOUS
  Filled 2011-04-27 (×10): qty 100

## 2011-04-27 MED ORDER — ASPIRIN EC 81 MG PO TBEC
81.0000 mg | DELAYED_RELEASE_TABLET | Freq: Every day | ORAL | Status: DC
  Administered 2011-04-29 – 2011-05-06 (×8): 81 mg via ORAL
  Filled 2011-04-27 (×8): qty 1

## 2011-04-27 MED ORDER — CARVEDILOL 3.125 MG PO TABS
3.1250 mg | ORAL_TABLET | Freq: Two times a day (BID) | ORAL | Status: DC
  Filled 2011-04-27 (×3): qty 1

## 2011-04-27 MED ORDER — CARVEDILOL 6.25 MG PO TABS
6.2500 mg | ORAL_TABLET | Freq: Two times a day (BID) | ORAL | Status: DC
  Administered 2011-04-27 – 2011-05-06 (×18): 6.25 mg via ORAL
  Filled 2011-04-27 (×22): qty 1

## 2011-04-27 MED ORDER — ASPIRIN 81 MG PO CHEW
324.0000 mg | CHEWABLE_TABLET | ORAL | Status: AC
  Administered 2011-04-28: 324 mg via ORAL
  Filled 2011-04-27: qty 1

## 2011-04-27 MED ORDER — ONDANSETRON HCL 4 MG/2ML IJ SOLN: 4.0000 mg | Freq: Four times a day (QID) | INTRAMUSCULAR | Status: DC | PRN

## 2011-04-27 MED ORDER — NITROGLYCERIN 0.4 MG SL SUBL
0.4000 mg | SUBLINGUAL_TABLET | SUBLINGUAL | Status: DC | PRN
  Filled 2011-04-27: qty 25

## 2011-04-27 MED ORDER — ALPRAZOLAM 0.25 MG PO TABS
0.2500 mg | ORAL_TABLET | Freq: Two times a day (BID) | ORAL | Status: DC | PRN
  Administered 2011-05-01 – 2011-05-04 (×2): 0.25 mg via ORAL
  Filled 2011-04-27 (×2): qty 1

## 2011-04-27 MED ORDER — AZITHROMYCIN 250 MG PO TABS
250.0000 mg | ORAL_TABLET | Freq: Every day | ORAL | Status: AC
  Administered 2011-04-28 – 2011-05-01 (×4): 250 mg via ORAL
  Filled 2011-04-27 (×4): qty 1

## 2011-04-27 MED ORDER — GUAIFENESIN-DM 100-10 MG/5ML PO SYRP
5.0000 mL | ORAL_SOLUTION | ORAL | Status: DC | PRN
  Administered 2011-04-27 – 2011-05-05 (×6): 5 mL via ORAL
  Filled 2011-04-27 (×7): qty 5

## 2011-04-27 NOTE — Progress Notes (Signed)
0515  VS 208/70 HR 82 96% 3L oxygen. Pt chest pain is now 8/10. Non radiating and patient is not diaphoretic.  CKMB 7.9 Trop 2.9.  2nd SL Ntg administered. Bradley Ferris Annette   0520 VS 175/69 HR 83 96% 3 L oxygen. Chest pain is now 5/10, non radiating.  Pt states that it's the same pain that she had upon admission.  3rd SL Ntg administered and MD on call paged.   Drake Leach   1191  Pt continues to have chest pain 2/10. VS 190/67 HR 89 98% 3L oxygen.  MD (Dr. Margo Aye) made aware of events and new orders received.   Bradley Ferris [place]

## 2011-04-27 NOTE — Progress Notes (Signed)
Subjective:  The patient had more chest pain last pm and was started on integrilin and IV NTG.  Not having any chest pain this am. Repeat EKG not yet done. Troponins show 2.99 and CKMB 7.9 c/w NSTEMI  Objective:  Vital Signs in the last 24 hours: Temp:  [97.1 F (36.2 C)-99.5 F (37.5 C)] 97.8 F (36.6 C) (11/22 0450) Pulse Rate:  [70-92] 86  (11/22 0525) Resp:  [17-24] 20  (11/22 0525) BP: (134-214)/(60-80) 190/67 mmHg (11/22 0525) SpO2:  [93 %-99 %] 95 % (11/22 0520) FiO2 (%):  [2 %] 2 % (11/21 1700) Weight:  [232 lb 11.2 oz (105.552 kg)] 232 lb 11.2 oz (105.552 kg) (11/21 2350)  Intake/Output from previous day:   Intake/Output from this shift:       . sodium chloride   Intravenous Once  . acetaminophen      . acetaminophen  650 mg Oral Once  . aspirin  324 mg Oral NOW  . aspirin EC  81 mg Oral Daily  . carvedilol  3.125 mg Oral BID WC  . eptifibatide  180 mcg/kg Intravenous Once  . fentaNYL  50 mcg Intravenous Once  . glimepiride  4 mg Oral BID  . heparin  4,000 Units Intravenous Once  . insulin aspart  0-15 Units Subcutaneous TID WC  . insulin aspart  0-5 Units Subcutaneous QHS  . insulin aspart  10 Units Subcutaneous Once  . linagliptin  5 mg Oral Daily  . lisinopril  5 mg Oral Daily  . pregabalin  50 mg Oral QHS  . rosuvastatin  20 mg Oral QHS  . sodium chloride  3 mL Intravenous Q12H  . DISCONTD: acetaminophen  1,000 mg Oral Once  . DISCONTD: aspirin EC  81 mg Oral Daily  . DISCONTD: aspirin  300 mg Rectal NOW      . sodium chloride 250 mL (04/27/11 0354)  . eptifibatide 2 mcg/kg/min (04/27/11 0656)  . heparin 1,050 Units/hr (04/27/11 0249)  . nitroGLYCERIN 5 mcg/min (04/27/11 0636)    Physical Exam: The patient appears to be in no distress.  Skin feels hot.  Head and neck exam reveals that the pupils are equal and reactive.  The extraocular movements are full.  There is no scleral icterus.  Mouth and pharynx are benign.  No lymphadenopathy.  No carotid  bruits.  The jugular venous pressure is normal.  Thyroid is not enlarged or tender.  Chest reveals coarse rhonchi bilat.  Heart reveals no abnormal lift or heave.  First and second heart sounds are normal.  There is no murmur gallop rub or click.  The abdomen is soft and nontender.  Bowel sounds are normoactive.  There is no hepatosplenomegaly or mass.  There are no abdominal bruits.  Extremities reveal no phlebitis or edema.  Pedal pulses are good.  There is no cyanosis or clubbing. Right radial pulse weaker than left.  Neurologic exam is normal strength and no lateralizing weakness.  No sensory deficits.  Integument reveals no rash  Lab Results:  Basename 04/26/11 1711 04/26/11 1555 04/26/11 1515  WBC -- -- 8.9  HGB 10.9* 11.2* --  PLT -- -- 140*    Basename 04/26/11 1711 04/26/11 1555  NA 136 136  K 4.1 4.2  CL 104 104  CO2 -- --  GLUCOSE 326* 338*  BUN 23 23  CREATININE 1.30* 1.10    Basename 04/27/11 0149 04/26/11 2045  TROPONINI 2.99* 2.11*   Hepatic Function Panel  Basename 04/26/11 1515  PROT 7.8  ALBUMIN 3.2*  AST 23  ALT 16  ALKPHOS 56  BILITOT 0.3  BILIDIR 0.1  IBILI 0.2*   No results found for this basename: CHOL in the last 72 hours No results found for this basename: PROTIME in the last 72 hours  Imaging: Imaging results have been reviewed  Cardiac Studies:  Assessment/Plan:  Patient Active Problem List  Diagnoses  . NSTEMI (non-ST elevated myocardial infarction)        Enzymes elevated.  EKG just done shows no acute changes.      Plan: Cardiac cath Friday am.Continue integrilin, heparin, IV NTG        . Diabetes mellitus      On SSI.  BS better.  A1C pending  . Anemia      Anemia panel pending  . Dyslipidemia      On Crestor   . Nonproductive cough      Get repeat xray. Add Mucinex and Zpak Essential hypertension      Increase carvedilol    LOS: 1 day    [name] 04/27/2011, 7:53 AM

## 2011-04-27 NOTE — Plan of Care (Signed)
Problem: Phase II Progression Outcomes Goal: Cath/PCI Day Path if indicated Outcome: Completed/Met Date Met:  04/27/11 Patient scheduled for cath 04/28/11

## 2011-04-27 NOTE — Progress Notes (Signed)
Increased Heparin to 13.0 ml/hr from 10.5 and gave 2000 unit Bolus per order. Bernita Raisin, RN

## 2011-04-27 NOTE — Progress Notes (Signed)
ANTICOAGULATION CONSULT NOTE - Initial Consult  Pharmacy Consult for Heparin with addition of integrilin  Indication: chest pain/ACS  Allergies  Allergen Reactions  . Codeine Other (See Comments)    Childhood allergy    Patient Measurements: Weight: 232 lb 11.2 oz (105.552 kg) Stated wt 62 inches Heparin dosing weight: 76 kg  Vital Signs: Temp: 97.8 F (36.6 C) (11/22 0450) Temp src: Oral (11/22 0450) BP: 214/66 mmHg (11/22 0450) Pulse Rate: 79  (11/22 0450)  Labs:  Basename 04/27/11 0150 04/27/11 0149 04/26/11 2045 04/26/11 1711 04/26/11 1555 04/26/11 1515  HGB -- -- -- 10.9* 11.2* --  HCT -- -- -- 32.0* 33.0* 30.1*  PLT -- -- -- -- -- 140*  APTT 30 -- -- -- -- --  LABPROT 16.1* -- -- -- -- --  INR 1.26 -- -- -- -- --  HEPARINUNFRC -- -- -- -- -- --  CREATININE -- -- -- 1.30* 1.10 --  CKTOTAL -- 1284* -- -- -- --  CKMB -- 7.9* -- -- -- --  TROPONINI -- 2.99* 2.11* -- -- --   Creat Clearance ~55 ml/min Medical History: Past Medical History  Diagnosis Date  . Diabetes mellitus   . Hypertension   . Hyperlipemia   . Fibromyalgia   . Obesity   . Angina   . Sleep apnea   . Anxiety   . Hiatal hernia     Medications:  Prescriptions prior to admission  Medication Sig Dispense Refill  . aspirin EC 81 MG tablet Take 81 mg by mouth daily.        Marland Kitchen Dextromethorphan-Guaifenesin (DIABETIC TUSSIN MAX ST) 10-200 MG/5ML LIQD Take 5 mLs by mouth every 2 (two) hours as needed. For cough       . furosemide (LASIX) 20 MG tablet Take 20 mg by mouth daily.        Marland Kitchen glimepiride (AMARYL) 4 MG tablet Take 4 mg by mouth 2 (two) times daily.        Marland Kitchen lisinopril (PRINIVIL,ZESTRIL) 5 MG tablet Take 5 mg by mouth daily.        . metFORMIN (GLUCOPHAGE) 1000 MG tablet Take 1,000 mg by mouth 2 (two) times daily with a meal.        . pregabalin (LYRICA) 50 MG capsule Take 50 mg by mouth at bedtime.        . promethazine-dextromethorphan (PROMETHAZINE-DM) 6.25-15 MG/5ML syrup Take 10 mLs  by mouth 4 (four) times daily as needed. For cough       . rosuvastatin (CRESTOR) 20 MG tablet Take 20 mg by mouth at bedtime.        . sitaGLIPtin (JANUVIA) 100 MG tablet Take 100 mg by mouth daily.          Assessment: 70 y.o. Female presents with chest pain with positive troponins. Heparin infusing at 1050/hr Plan for cath on Friday. Now to begin integrelin   Goal of Therapy:  Renally adjusted integrelin for nstemi   Decrease heparin goal to 0.3-0.5     Plan: 1) 174mcg/kg bolus followed by 57mcg/kg/min infusion.  2) 6 hour plt ct  3)  f/u srcr to insure crcl >72ml min if less decrease to 1mcg/kg/min  Janice Coffin 04/27/2011,5:48 AM

## 2011-04-27 NOTE — Progress Notes (Signed)
ANTICOAGULATION CONSULT NOTE - Initial Consult  Pharmacy Consult for Heparin Indication: chest pain/ACS  Allergies  Allergen Reactions  . Codeine Other (See Comments)    Childhood allergy    Patient Measurements: Weight: 232 lb 11.2 oz (105.552 kg) Stated wt 62 inches Heparin dosing weight: 76 kg  Vital Signs: Temp: 97.2 F (36.2 C) (11/21 2350) Temp src: Oral (11/21 2350) BP: 188/70 mmHg (11/21 2350) Pulse Rate: 70  (11/21 2350)  Labs:  Basename 04/26/11 2045 04/26/11 1711 04/26/11 1555 04/26/11 1515  HGB -- 10.9* 11.2* --  HCT -- 32.0* 33.0* 30.1*  PLT -- -- -- 140*  APTT -- -- -- --  LABPROT -- -- -- --  INR -- -- -- --  HEPARINUNFRC -- -- -- --  CREATININE -- 1.30* 1.10 --  CKTOTAL -- -- -- --  CKMB -- -- -- --  TROPONINI 2.11* -- -- --   Creat Clearance ~55 ml/min Medical History: Past Medical History  Diagnosis Date  . Diabetes mellitus   . Hypertension   . Hyperlipemia   . Fibromyalgia   . Obesity     Medications:  Prescriptions prior to admission  Medication Sig Dispense Refill  . aspirin EC 81 MG tablet Take 81 mg by mouth daily.        Marland Kitchen Dextromethorphan-Guaifenesin (DIABETIC TUSSIN MAX ST) 10-200 MG/5ML LIQD Take 5 mLs by mouth every 2 (two) hours as needed. For cough       . furosemide (LASIX) 20 MG tablet Take 20 mg by mouth daily.        Marland Kitchen glimepiride (AMARYL) 4 MG tablet Take 4 mg by mouth 2 (two) times daily.        Marland Kitchen lisinopril (PRINIVIL,ZESTRIL) 5 MG tablet Take 5 mg by mouth daily.        . metFORMIN (GLUCOPHAGE) 1000 MG tablet Take 1,000 mg by mouth 2 (two) times daily with a meal.        . pregabalin (LYRICA) 50 MG capsule Take 50 mg by mouth at bedtime.        . promethazine-dextromethorphan (PROMETHAZINE-DM) 6.25-15 MG/5ML syrup Take 10 mLs by mouth 4 (four) times daily as needed. For cough       . rosuvastatin (CRESTOR) 20 MG tablet Take 20 mg by mouth at bedtime.        . sitaGLIPtin (JANUVIA) 100 MG tablet Take 100 mg by mouth  daily.          Assessment: 70 y.o. Female presents with chest pain. Now with positive troponins. To begin heparin level. Plan for cath on Friday  Goal of Therapy:  Heparin level 0.3-0.7 units/ml   Plan:  1. Heparin bolus 4000 units IV  2. Heparin gtt at 1050 units/hr 3. Will check 6 hr heparin level and CBC 4. Daily heparin level and CBC beginning 11/22  Lavonia Dana 04/27/2011,1:46 AM

## 2011-04-27 NOTE — Progress Notes (Addendum)
ANTICOAGULATION CONSULT NOTE - Follow Up Consult  Pharmacy Consult for heparin and integrilin Indication: NSTEMI  Allergies  Allergen Reactions  . Codeine Other (See Comments)    Childhood allergy    Patient Measurements: Height: 5\' 2"  (157.5 cm) Weight: 237 lb 10.5 oz (107.8 kg) IBW/kg (Calculated) : 50.1    Vital Signs: Temp: 98.4 F (36.9 C) (11/22 1423) Temp src: Oral (11/22 1423) BP: 134/62 mmHg (11/22 1423) Pulse Rate: 87  (11/22 1423)  Labs:  Basename 04/27/11 1741 04/27/11 1248 04/27/11 0920 04/27/11 0842 04/27/11 0150 04/27/11 0149 04/26/11 1711 04/26/11 1555 04/26/11 1515  HGB 8.8* -- -- -- -- -- 10.9* -- --  HCT 27.3* -- -- -- -- -- 32.0* 33.0* --  PLT 131* -- -- -- -- -- -- -- 140*  APTT -- -- -- -- 30 -- -- -- --  LABPROT 16.4* -- -- -- 16.1* -- -- -- --  INR 1.30 -- -- -- 1.26 -- -- -- --  HEPARINUNFRC 0.24* -- 0.12* -- -- -- -- -- --  CREATININE 1.35* -- 1.14* -- -- -- 1.30* -- --  CKTOTAL -- 1458* -- QUESTIONABLE IDENTIFICATION / INCORRECTLY LABELED SPECIMEN -- 1284* -- -- --  CKMB -- 3.7 -- QUESTIONABLE IDENTIFICATION / INCORRECTLY LABELED SPECIMEN -- 7.9* -- -- --  TROPONINI -- 1.70* -- QUESTIONABLE IDENTIFICATION / INCORRECTLY LABELED SPECIMEN -- 2.99* -- -- --   Estimated Creatinine Clearance: 45.4 ml/min (by C-G formula based on Cr of 1.35).   Medications:  Scheduled:    . acetaminophen  650 mg Oral Once  . aspirin  324 mg Oral NOW  . aspirin  324 mg Oral Pre-Cath  . aspirin EC  81 mg Oral Daily  . azithromycin  500 mg Oral Daily   Followed by  . azithromycin  250 mg Oral Daily  . carvedilol  6.25 mg Oral BID WC  . diazepam  5 mg Oral On Call  . eptifibatide  180 mcg/kg Intravenous Once  . glimepiride  4 mg Oral BID  . guaiFENesin  600 mg Oral BID  . heparin  2,000 Units Intravenous Once  . heparin  4,000 Units Intravenous Once  . insulin aspart  0-15 Units Subcutaneous TID WC  . insulin aspart  0-5 Units Subcutaneous QHS  . insulin  aspart  10 Units Subcutaneous Once  . linagliptin  5 mg Oral Daily  . lisinopril  5 mg Oral Daily  . pregabalin  50 mg Oral QHS  . rosuvastatin  20 mg Oral QHS  . sodium chloride  3 mL Intravenous Q12H  . DISCONTD: aspirin EC  81 mg Oral Daily  . DISCONTD: aspirin EC  81 mg Oral Daily  . DISCONTD: aspirin  300 mg Rectal NOW  . DISCONTD: carvedilol  3.125 mg Oral BID WC    Assessment: 70 yo F with heparin level slightly less than goal.  Hgb decreased, platelets stable.  No complications noted.  Note cath planned for am.  Goal of Therapy:  Heparin level 0.3-0.5    Plan:  1.  Increase heparin to infusion rate 1400 units/hr. 2.  Check heparin level and CBC in 8 hours. 3.  Continue integrilin at 2 mcg/kg/min  Jill Side L. Illene Bolus, PharmD, BCPS Clinical Pharmacist Pager: [phone] 04/27/2011 7:25 PM

## 2011-04-27 NOTE — Progress Notes (Signed)
0510 Nsg.  Pt c/o chest pain 5/10. BP 198/80 HR 86.  Oxygen 97% on 3L nasal canula. EKG completed and 1 SL ntg given. Bradley Ferris [place]

## 2011-04-27 NOTE — Progress Notes (Signed)
ANTICOAGULATION CONSULT NOTE - Follow Up Consult  Pharmacy Consult for Heparin and Integrilin Indication: NSTEMI  Allergies  Allergen Reactions  . Codeine Other (See Comments)    Childhood allergy    Patient Measurements: Weight: 232 lb 11.2 oz (105.552 kg) Adjusted Body Weight: 72.3 kg  Vital Signs: Temp: 98.2 F (36.8 C) (11/22 0900) Temp src: Oral (11/22 0900) BP: 205/67 mmHg (11/22 0900) Pulse Rate: 100  (11/22 0900)  Labs:  Basename 04/27/11 0920 04/27/11 0150 04/27/11 0149 04/26/11 2045 04/26/11 1711 04/26/11 1555 04/26/11 1515  HGB -- -- -- -- 10.9* 11.2* --  HCT -- -- -- -- 32.0* 33.0* 30.1*  PLT -- -- -- -- -- -- 140*  APTT -- 30 -- -- -- -- --  LABPROT -- 16.1* -- -- -- -- --  INR -- 1.26 -- -- -- -- --  HEPARINUNFRC 0.12* -- -- -- -- -- --  CREATININE 1.14* -- -- -- 1.30* 1.10 --  CKTOTAL -- -- 1284* -- -- -- --  CKMB -- -- 7.9* -- -- -- --  TROPONINI -- -- 2.99* 2.11* -- -- --   CrCl is unknown because there is no height on file for the current visit.   Medications:  Scheduled:    . sodium chloride   Intravenous Once  . acetaminophen      . acetaminophen  650 mg Oral Once  . aspirin  324 mg Oral NOW  . aspirin EC  81 mg Oral Daily  . azithromycin  500 mg Oral Daily   Followed by  . azithromycin  250 mg Oral Daily  . carvedilol  6.25 mg Oral BID WC  . eptifibatide  180 mcg/kg Intravenous Once  . fentaNYL  50 mcg Intravenous Once  . glimepiride  4 mg Oral BID  . guaiFENesin  600 mg Oral BID  . heparin  2,000 Units Intravenous Once  . heparin  4,000 Units Intravenous Once  . insulin aspart  0-15 Units Subcutaneous TID WC  . insulin aspart  0-5 Units Subcutaneous QHS  . insulin aspart  10 Units Subcutaneous Once  . linagliptin  5 mg Oral Daily  . lisinopril  5 mg Oral Daily  . pregabalin  50 mg Oral QHS  . rosuvastatin  20 mg Oral QHS  . sodium chloride  3 mL Intravenous Q12H  . DISCONTD: acetaminophen  1,000 mg Oral Once  . DISCONTD: aspirin  EC  81 mg Oral Daily  . DISCONTD: aspirin  300 mg Rectal NOW  . DISCONTD: carvedilol  3.125 mg Oral BID WC    Assessment: 70 yo F on heparin and Integrilin for NSTEMI. Heparin level is below goal. No bleeding noted. Spoke with RN and no IV site issues. Plan for cath on Friday.  Goal of Therapy:  heparin level 0.3 -0.5   Plan:  1. Heparin 2000 units IV bolus then increase rate to 1300 units/hr 2. Continue Integrilin at 2 mcg/kg/min 2. Heparin level and CBC 6 hrs after rate change  Loura Back Danielle 04/27/2011,10:09 AM

## 2011-04-28 ENCOUNTER — Other Ambulatory Visit: Payer: Self-pay

## 2011-04-28 ENCOUNTER — Encounter (HOSPITAL_COMMUNITY)
Admission: EM | Disposition: A | Discharge status: Home Health | Payer: Self-pay | Source: Home / Self Care | Attending: Cardiology

## 2011-04-28 ENCOUNTER — Encounter (HOSPITAL_COMMUNITY): Payer: Self-pay | Admitting: Physician Assistant

## 2011-04-28 DIAGNOSIS — D509 Iron deficiency anemia, unspecified: Secondary | ICD-10-CM

## 2011-04-28 DIAGNOSIS — D689 Coagulation defect, unspecified: Secondary | ICD-10-CM

## 2011-04-28 DIAGNOSIS — K625 Hemorrhage of anus and rectum: Secondary | ICD-10-CM

## 2011-04-28 LAB — BASIC METABOLIC PANEL
CO2: 25 mEq/L (ref 19–32)
Chloride: 98 mEq/L (ref 96–112)
Creatinine, Ser: 1.65 mg/dL — ABNORMAL HIGH (ref 0.50–1.10)
GFR calc Af Amer: 36 mL/min — ABNORMAL LOW (ref >90)
Potassium: 3.6 mEq/L (ref 3.5–5.1)
Sodium: 131 mEq/L — ABNORMAL LOW (ref 135–145)

## 2011-04-28 LAB — CBC
HCT: 25.9 % — ABNORMAL LOW (ref 36.0–46.0)
Hemoglobin: 8.5 g/dL — ABNORMAL LOW (ref 12.0–15.0)
MCV: 87.2 fL (ref 78.0–100.0)
RBC: 2.97 MIL/uL — ABNORMAL LOW (ref 3.87–5.11)
RDW: 14.3 % (ref 11.5–15.5)
WBC: 6.2 10*3/uL (ref 4.0–10.5)

## 2011-04-28 LAB — TYPE AND SCREEN: Antibody Screen: NEGATIVE

## 2011-04-28 LAB — GLUCOSE, CAPILLARY
Glucose-Capillary: 54 mg/dL — ABNORMAL LOW (ref 70–99)
Glucose-Capillary: 75 mg/dL (ref 70–99)
Glucose-Capillary: 188 mg/dL — ABNORMAL HIGH (ref 70–99)
Glucose-Capillary: 158 mg/dL — ABNORMAL HIGH (ref 70–99)

## 2011-04-28 LAB — CARDIAC PANEL(CRET KIN+CKTOT+MB+TROPI): Troponin I: 1.01 ng/mL (ref <0.30)

## 2011-04-28 LAB — ABO/RH: ABO/RH(D): A NEG

## 2011-04-28 LAB — HEPARIN LEVEL (UNFRACTIONATED): Heparin Unfractionated: 0.35 IU/mL (ref 0.30–0.70)

## 2011-04-28 SURGERY — LEFT HEART CATHETERIZATION WITH CORONARY ANGIOGRAM
Anesthesia: LOCAL

## 2011-04-28 MED ORDER — PANTOPRAZOLE SODIUM 40 MG PO TBEC
40.0000 mg | DELAYED_RELEASE_TABLET | Freq: Every day | ORAL | Status: DC
  Administered 2011-04-28 – 2011-05-06 (×9): 40 mg via ORAL
  Filled 2011-04-28 (×9): qty 1

## 2011-04-28 MED ORDER — FUROSEMIDE 10 MG/ML IJ SOLN
20.0000 mg | Freq: Once | INTRAMUSCULAR | Status: AC
  Administered 2011-04-28: 20 mg via INTRAVENOUS

## 2011-04-28 MED ORDER — FUROSEMIDE 10 MG/ML IJ SOLN
INTRAMUSCULAR | Status: AC
  Administered 2011-04-28: 20 mg via INTRAVENOUS
  Filled 2011-04-28: qty 4

## 2011-04-28 MED ORDER — BENZONATATE 100 MG PO CAPS
200.0000 mg | ORAL_CAPSULE | Freq: Three times a day (TID) | ORAL | Status: DC | PRN
  Administered 2011-04-29 – 2011-05-02 (×3): 200 mg via ORAL
  Filled 2011-04-28 (×4): qty 2

## 2011-04-28 MED ORDER — GLUCOSE 40 % PO GEL
ORAL | Status: AC
  Filled 2011-04-28: qty 1

## 2011-04-28 NOTE — Consult Note (Signed)
[facility] Consult: 2:41 PM 04/28/2011   Referring Provider: Riley Kill  Primary Care Physician: [place]  Primary Gastroenterologist: none, unassigned  Reason for Consultation:  Anemia, FOB negative stools.  HPI: [name] is a morbidly obese, diabetic 70 y.o. white female. Admitted 2 days ago with c/o left sided chest pain, non exertional.  No nausea, diaphoresis. Long standing weakness with falls at home.  Long standing hx of episodic chest pain.  Ruled in for NSTEMI.  Cardiac cath cancelled because of worsening anemia. Hgb on day of admit varied from initial value: 9.5 up to 11.2. Day 2 it was 8.8, day 3 (today) it is 8.5.  Iron level is less than 10.  Treated with Integrillin, IV heparin drip, now discontinued.  She is FOB negative.  Gives hx of rare scant blood per rectum when she wipes.  Family saw a dark stool in ED 2 days ago. No prior colonoscopy.  Has known hiatal hernia based on EGD done more than 10 years ago.  Takes 81mg  ASA at home but no NSAIDS despite issues of chronic musc/skeletal pain. No PPI meds PTA or currently.  GI ROS positive for occasional solid dysphagia when she is unable to masticate food well (she is edentulous and unable to chew effectively).  It is a stable problem for many years.  Says her appetite is diminished generally. Weighed 236 # at MD office in early October 2012 (I called Dr Deitra Mayo office to verify this), currently tips the scales at 238# . Has daily brown BMs.   Past Medical History  Diagnosis Date  . Diabetes mellitus   . Hypertension   . Hyperlipemia   . Fibromyalgia   . Obesity   . Angina   . Sleep apnea   . Anxiety   . Hiatal hernia   . NSTEMI (non-ST elevated myocardial infarction) 04/27/2011  . Anemia 04/27/2011  . Restless leg syndrome     Past Surgical History  Procedure Date  . Appendectomy     Prior to Admission medications   Medication Sig Start Date End Date Taking? Authorizing  Provider  aspirin EC 81 MG tablet Take 81 mg by mouth daily.     Yes Historical Provider, MD  Dextromethorphan-Guaifenesin (DIABETIC TUSSIN MAX ST) 10-200 MG/5ML LIQD Take 5 mLs by mouth every 2 (two) hours as needed. For cough    Yes Historical Provider, MD  furosemide (LASIX) 20 MG tablet Take 20 mg by mouth daily.     Yes Historical Provider, MD  glimepiride (AMARYL) 4 MG tablet Take 4 mg by mouth 2 (two) times daily.     Yes Historical Provider, MD  lisinopril (PRINIVIL,ZESTRIL) 5 MG tablet Take 5 mg by mouth daily.     Yes Historical Provider, MD  metFORMIN (GLUCOPHAGE) 1000 MG tablet Take 1,000 mg by mouth 2 (two) times daily with a meal.     Yes Historical Provider, MD  pregabalin (LYRICA) 50 MG capsule Take 50 mg by mouth at bedtime.     Yes Historical Provider, MD  promethazine-dextromethorphan (PROMETHAZINE-DM) 6.25-15 MG/5ML syrup Take 10 mLs by mouth 4 (four) times daily as needed. For cough    Yes Historical Provider, MD  rosuvastatin (CRESTOR) 20 MG tablet Take 20 mg by mouth at bedtime.     Yes Historical Provider, MD  sitaGLIPtin (JANUVIA) 100 MG tablet Take 100 mg by mouth daily.     Yes Historical Provider, MD    Scheduled Meds:    . aspirin  324 mg Oral  NOW  . aspirin  324 mg Oral Pre-Cath  . aspirin EC  81 mg Oral Daily  . azithromycin  250 mg Oral Daily  . benzonatate  100 mg Oral Once  . carvedilol  6.25 mg Oral BID WC  . diazepam  5 mg Oral On Call  . glimepiride  4 mg Oral BID  . guaiFENesin  600 mg Oral BID  . insulin aspart  0-15 Units Subcutaneous TID WC  . insulin aspart  0-5 Units Subcutaneous QHS  . linagliptin  5 mg Oral Daily  . lisinopril  5 mg Oral Daily  . pregabalin  50 mg Oral QHS                     Infusions:    . sodium chloride 50 mL/hr at 04/28/11 0940  . nitroGLYCERIN 5 mcg/min (04/27/11 0636)   PRN Meds: acetaminophen, ALPRAZolam, guaiFENesin-dextromethorphan, nitroGLYCERIN, ondansetron (ZOFRAN) IV, sodium chloride, zolpidem,     Allergies as of 04/26/2011 - Review Complete 04/26/2011  Allergen Reaction Noted  . Codeine Other (See Comments) 04/26/2011    History reviewed. No pertinent family history.  History   Social History  . Marital Status: Divorced    Spouse Name: N/A    Number of Children: N/A  . Years of Education: N/A   Occupational History  . Not on file.   Social History Main Topics  . Smoking status: Never Smoker   . Smokeless tobacco: Never Used  . Alcohol Use: No  . Drug Use: No  . Sexually Active: No   Other Topics Concern  . Not on file   Social History Narrative  . No narrative on file    REVIEW OF SYSTEMS: Constitutional:  Generally weak with hx of falls ENT:  No epistaxis, no nasal discharge Pulm:  Chronic DOE, 2 pillow orthopnea.  No prior sleep studies.  Non productive cough. CV:  No palpitations.  Chronic LE edema GU:  Difficulty voiding at times, no hematuria. GI:  As above Heme:  Bruises easily.    Transfusions:  None ever Neuro:  Restless leg at night that causes LE pain Derm:  No itching.  Rash in skin folds. Endocrine:  Sugars into 300's at times Immunization:  Flu, zoster and pneumovax up to date.  PHYSICAL EXAM: Vital signs in last 24 hours: Temp:  [98.1 F (36.7 C)-98.7 F (37.1 C)] 98.7 F (37.1 C) (11/23 1416) Pulse Rate:  [59-74] 71  (11/23 1416) Resp:  [17-22] 19  (11/23 1416) BP: (106-140)/(63-74) 127/63 mmHg (11/23 1416) SpO2:  [92 %-99 %] 99 % (11/23 1416) Weight:  [106.8 kg (235 lb 7.2 oz)-108.1 kg (238 lb 5.1 oz)] 238 lb 5.1 oz (108.1 kg) (11/23 0522)  Last BM Date: 04/28/11  General: Morbidly obese w.f. Who looks old for age and chronically unwell Head:  Atraumatic  Eyes:  No conjunctival pallor.  EOMI. Ears:  Slightly HOH  Nose:  No discharge Mouth:  Edentulous.  Pink, moist MM. Neck:  Obese, no masses Lungs:  Clear B, but BS diminished. Heart: RRR, no MRG.  s1 s2 audible. Abdomen:  Soft, NT, no masses, no HSM.  Active BS.  No  bruits.   Rectal: No masses internally, brown FOB- stool.  Small external hemorrhoids not thrombosed or erythematous.   Musc/Skeltl: no gross joint deformities Extremities:  Non pitting edems of feet  Neurologic:  A & O x three.  Moves all 4's.  Grossly has no deficits Skin:  No rash  or lesions Tattoos:  none Heme: Purpura on UE   Psych:  Pleasant, seems depressed.    Intake/Output from previous day: 11/22 0701 - 11/23 0700 In: 120 [P.O.:120] Out: 650 [Urine:650] Intake/Output this shift: Total I/O In: -  Out: 1 [Stool:1]  LAB RESULTS:  Basename 04/28/11 0832 04/27/11 1741 04/26/11 1711 04/26/11 1515  WBC 6.2 8.1 -- 8.9  HGB 8.5* 8.8* 10.9* --  HCT 25.9* 27.3* 32.0* --  PLT 133* 131* -- 140*    Ref. Range 04/27/2011 09:20  Iron Latest Range: 42-135 ug/dL <40 (L)  UIBC Latest Range: 125-400 ug/dL 981  TIBC Latest Range: 250-470 ug/dL Not calculated due to Iron <10.  Saturation Ratios Latest Range: 20-55 % Not calculated due to Iron <10.  Ferritin Latest Range: 10-291 ng/mL 144  Folate No range found 16.9   Lab Results  Component Value Date   NA 131* 04/28/2011   NA 130* 04/27/2011   NA 132* 04/27/2011   K 3.6 04/28/2011   K 3.9 04/27/2011   K 4.2 04/27/2011   CL 98 04/28/2011   CL 98 04/27/2011   CL 100 04/27/2011   CO2 25 04/28/2011   CO2 22 04/27/2011   CO2 22 04/27/2011   GLUCOSE 70 04/28/2011   GLUCOSE 193* 04/27/2011   GLUCOSE 294* 04/27/2011   BUN 33* 04/28/2011   BUN 26* 04/27/2011   BUN 23 04/27/2011   CREATININE 1.65* 04/28/2011   CREATININE 1.35* 04/27/2011   CREATININE 1.14* 04/27/2011   CALCIUM 8.5 04/28/2011   CALCIUM 8.5 04/27/2011   CALCIUM 8.7 04/27/2011   Lab Results  Component Value Date   PROT 7.8 04/26/2011   ALBUMIN 3.2* 04/26/2011   AST 23 04/26/2011   ALT 16 04/26/2011   ALKPHOS 56 04/26/2011   BILITOT 0.3 04/26/2011   BILIDIR 0.1 04/26/2011   IBILI 0.2* 04/26/2011   PT/INR Lab Results  Component Value Date   INR 1.30  04/27/2011   INR 1.26 04/27/2011   TSH  0.892   Apr 27, 2011   RADIOLOGY STUDIES:  Dg Chest Port 1 View  04/27/2011  *RADIOLOGY REPORT*  Clinical Data: Cough and dyspnea  PORTABLE CHEST - 1 VIEW  Comparison: 04/26/2011  Findings: Mild cardiomegaly.  Vascular congestion has increased. Mild interstitial edema has developed.  No pneumothorax.  IMPRESSION: Mild CHF.  Original Report Authenticated By: Donavan Burnet, M.D.    ENDOSCOPIC STUDIES: None found but she recalls a remote EGD  IMPRESSION: 1.  Iron deficiency anemia.  FOB negative on 2 of 2 stools today.  This is chronic based on the low Iron level. 2.  NSTEMI.  Chest pain resolved. 3.  DM2, now on insulin 4.  CHF by chest xray.  No echo done yet. Note that Dr. Patty Sermons started Zithromax, started today. Does not clearly have PNA on her xray. 5.  Hiatal hernia by pt report. 6.  Renal insufficiency.       7.  Morbid obesity. 8. Cardiomegaly.  Given body habitus,  nocturnal breathing issues: any intentions of pursuing studies for sleep apnea?    PLAN: 1.  Add empiric protonix, I ordered. 2. Will eventually need colonoscopy/endoscopy.  Will need to work out timing of this in light of NSTEMI and pending cardiac cath.  LOS: 2 days   Jennye Moccasin  04/28/2011, 2:41 PM

## 2011-04-28 NOTE — Progress Notes (Signed)
Bedside guiac done and was negative (minimal sample). GI to see, called.

## 2011-04-28 NOTE — Progress Notes (Signed)
Utilization review completed. Emmabelle Fear, RN, BSN. 04/28/11 

## 2011-04-28 NOTE — Progress Notes (Signed)
Subjective:  She is doing well, but hemoglobin is dropping, and Cr elevated, so cath is cancelled.  Family says she had some blood in stool in ER  (dark).  No recurrent chest pain.  Objective:  Vital Signs in the last 24 hours: Temp:  [98.1 F (36.7 C)-98.4 F (36.9 C)] 98.1 F (36.7 C) (11/23 0450) Pulse Rate:  [59-87] 63  (11/23 0810) Resp:  [17-22] 22  (11/23 0450) BP: (106-140)/(62-74) 140/64 mmHg (11/23 1052) SpO2:  [92 %-98 %] 96 % (11/23 0450) Weight:  [106.8 kg (235 lb 7.2 oz)-108.1 kg (238 lb 5.1 oz)] 238 lb 5.1 oz (108.1 kg) (11/23 0522)  Intake/Output from previous day: 11/22 0701 - 11/23 0700 In: 120 [P.O.:120] Out: 650 [Urine:650]   Physical Exam: General: Well developed, well nourished, in no acute distress. Head:  Normocephalic and atraumatic. Lungs: Clear to auscultation and percussion. Heart: Normal S1 and S2.  No murmur, rubs or gallops. Abdomen is soft, rectal not yet done.  Pulses: Pulses normal in all 4 extremities. Extremities: No clubbing or cyanosis. No edema. Neurologic: Alert and oriented x 3.    Lab Results:  [facility] 04/28/11 0832 04/27/11 1741  WBC 6.2 8.1  HGB 8.5* 8.8*  PLT 133* 131*    Basename 04/28/11 0832 04/27/11 1741  NA 131* 130*  K 3.6 3.9  CL 98 98  CO2 25 22  GLUCOSE 70 193*  BUN 33* 26*  CREATININE 1.65* 1.35*    Basename 04/27/11 1248 04/27/11 0842  TROPONINI 1.70* QUESTIONABLE IDENTIFICATION / INCORRECTLY LABELED SPECIMEN   Hepatic Function Panel  Basename 04/26/11 1515  PROT 7.8  ALBUMIN 3.2*  AST 23  ALT 16  ALKPHOS 56  BILITOT 0.3  BILIDIR 0.1  IBILI 0.2*    Basename 04/27/11 0920  CHOL 113   No results found for this basename: PROTIME in the last 72 hours  Imaging: Dg Chest 2 View  04/26/2011  *RADIOLOGY REPORT*  Clinical Data: Wheezing, chest pain and shortness of breath.  CHEST - 2 VIEW  Comparison: Single view of the chest 06/19/2009.  Findings: There is cardiomegaly and vascular congestion.   No frank pulmonary edema.  No focal airspace disease or effusion.  No pneumothorax.  IMPRESSION: Cardiomegaly and pulmonary vascular congestion.  Original Report Authenticated By: Bernadene Bell. Maricela Curet, M.D.   Dg Chest Port 1 View  04/27/2011  *RADIOLOGY REPORT*  Clinical Data: Cough and dyspnea  PORTABLE CHEST - 1 VIEW  Comparison: 04/26/2011  Findings: Mild cardiomegaly.  Vascular congestion has increased. Mild interstitial edema has developed.  No pneumothorax.  IMPRESSION: Mild CHF.  Original Report Authenticated By: Donavan Burnet, M.D.    EKG:  NSR.  Left axis deviation  Cardiac Studies:  None  Assessment/Plan:  Patient Active Hospital Problem List: NSTEMI (non-ST elevated myocardial infarction) (04/27/2011)   Assessment: Trop and MB up, but total CK is high.  Not clear as to source.  However, dropping HGb and rising Cr so cath cancelled today as she is clearly not an interventional candidate   Plan: Recheck labs, recheck cardiac panel Diabetes mellitus (04/27/2011)   Assessment: See above   Plan: Monitor Anemia (04/27/2011)   Assessment: Worsening   Plan: Hemoccult and GI consult if pos.  Type and screen and dc iv heparin, and eptifibatide. Renal insufficiency:  Monitor and continue fluids.  Recheck in am.       Brenda Pons, MD, [facility], [facility] 04/28/2011, 11:06 AM

## 2011-04-28 NOTE — Consult Note (Signed)
I have reviewed pt's chart, examined the pt  And spoke to Dr Riley Kill about managements. The concern is GI bleeding causing  Significant blood count  drop . It appears from our rectal  exam today that she is heme negative today. Her blood count decrease  is likely multifactorial. Question of whether we ought to endoscope  pt ( at least upper endo) prior to Cardiac cath to make sure no actively bleeding lesion present prior to anticipated anticoagulation. I think  There is no physical evidence of acute GI blood loss  At the moment and I would advice to go ahead with cardiology evaluation. We will follow pt with You and check her stool for occult blood frequently to monitor for bleeding.Marland Kitchen

## 2011-04-28 NOTE — Progress Notes (Signed)
Patient ID: [name], female   DOB: 05/13/1941, 70 y.o.   MRN: [id number] Case reviewed with Dr. Juanda Chance.  Stools time 2 are negative for blood.  Her exam from this standpoint is negative.  Will continue to monitor both Hgb as well as Cr, before cath.  If Hbg is stable, will add back low dose enox in am.  Plan radial cath on Monday from LRA if she remains stable and Hgb remains stable.  Type and screen has been obtained.  She is slightly wheezy, with minimal crackles on exam.  Therefore, will change to saline lock, and give a very low dose of furosemide.  Recheck Cr in am.    Shawnie Pons 04/28/2011 5:58 PM

## 2011-04-29 ENCOUNTER — Other Ambulatory Visit: Payer: Self-pay

## 2011-04-29 DIAGNOSIS — D689 Coagulation defect, unspecified: Secondary | ICD-10-CM

## 2011-04-29 DIAGNOSIS — K625 Hemorrhage of anus and rectum: Secondary | ICD-10-CM

## 2011-04-29 DIAGNOSIS — D509 Iron deficiency anemia, unspecified: Secondary | ICD-10-CM

## 2011-04-29 DIAGNOSIS — I214 Non-ST elevation (NSTEMI) myocardial infarction: Secondary | ICD-10-CM

## 2011-04-29 LAB — BASIC METABOLIC PANEL
Calcium: 8.6 mg/dL (ref 8.4–10.5)
Creatinine, Ser: 1.44 mg/dL — ABNORMAL HIGH (ref 0.50–1.10)
GFR calc Af Amer: 42 mL/min — ABNORMAL LOW (ref >90)

## 2011-04-29 LAB — CBC
Platelets: 139 10*3/uL — ABNORMAL LOW (ref 150–400)
RDW: 14 % (ref 11.5–15.5)
WBC: 6.2 10*3/uL (ref 4.0–10.5)

## 2011-04-29 LAB — GLUCOSE, CAPILLARY: Glucose-Capillary: 148 mg/dL — ABNORMAL HIGH (ref 70–99)

## 2011-04-29 MED ORDER — FUROSEMIDE 20 MG PO TABS
20.0000 mg | ORAL_TABLET | Freq: Every day | ORAL | Status: DC
  Administered 2011-04-29 – 2011-04-30 (×2): 20 mg via ORAL
  Filled 2011-04-29 (×3): qty 1

## 2011-04-29 MED ORDER — FERROUS SULFATE 325 (65 FE) MG PO TABS
325.0000 mg | ORAL_TABLET | Freq: Two times a day (BID) | ORAL | Status: DC
  Administered 2011-04-29 – 2011-05-06 (×14): 325 mg via ORAL
  Filled 2011-04-29 (×18): qty 1

## 2011-04-29 MED ORDER — SODIUM CHLORIDE 0.9 % IV SOLN
INTRAVENOUS | Status: DC
  Administered 2011-04-29: 20 mL/h via INTRAVENOUS
  Administered 2011-05-02: 17:00:00 via INTRAVENOUS

## 2011-04-29 NOTE — Progress Notes (Signed)
     [facility] Daily Rounding Note 04/29/2011, 8:40 AM  SUBJECTIVE: 2 to 3 BMs yest, one today.  C/o thoracic region back pain (chronic issue). Breathing better after getting Lasix overnight.  No belly pain OBJECTIVE: Chronically ill looking.    Vital signs in last 24 hours: Temp:  [97.6 F (36.4 C)-98.7 F (37.1 C)] 97.6 F (36.4 C) (11/24 0522) Pulse Rate:  [57-71] 57  (11/24 0634) Resp:  [18-20] 20  (11/24 0522) BP: (127-166)/(63-80) 166/80 mmHg (11/24 0522) SpO2:  [99 %] 99 % (11/24 0522) Weight:  [110.632 kg (243 lb 14.4 oz)] 243 lb 14.4 oz (110.632 kg) (11/24 0537) Last BM Date: 04/29/11  Heart: RRR Chest: Fine rales at bases. Abdomen: Soft, NT, obese, Active BS  Extremities: no edema Neuro/Psych:  Pleasant, cooperative.  Intake/Output from previous day: 11/23 0701 - 11/24 0700 In: 480 [P.O.:480] Out: 1051 [Urine:1050; Stool:1]    Lab Results:  [facility] 04/29/11 0530 04/28/11 0832 04/27/11 1741  WBC 6.2 6.2 8.1  HGB 8.6* 8.5* 8.8*  HCT 25.9* 25.9* 27.3*  PLT 139* 133* 131*   BMET  Basename 04/29/11 0530 04/28/11 0832 04/27/11 1741  NA 131* 131* 130*  K 4.2 3.6 3.9  CL 98 98 98  CO2 24 25 22   GLUCOSE 105* 70 193*  BUN 36* 33* 26*  CREATININE 1.44* 1.65* 1.35*  CALCIUM 8.6 8.5 8.5   LFT  Basename 04/26/11 1515  PROT 7.8  ALBUMIN 3.2*  AST 23  ALT 16  ALKPHOS 56  BILITOT 0.3  BILIDIR 0.1  IBILI 0.2*   PT/INR  Basename 04/27/11 1741 04/27/11 0150  LABPROT 16.4* 16.1*  INR 1.30 1.26   ASSESMENT: 1. Iron deficiency anemia. FOB negative on 2 of 2 stools. This is chronic based on the low Iron level.  Hgb stable.     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  Off blood thinners. 2. NSTEMI. Chest pain resolved. Cardiac cath set for Monday  3. DM2, now on insulin  4. CHF by chest xray. No echo done yet. Note that Dr. Patty Sermons started Zithromax, started today. Does not clearly have     PNA on her xray. Breathing better after Lasix. 5.  Hiatal hernia by pt report.  6. Renal insufficiency.  7. Morbid obesity.  8. Cardiomegaly. Given body habitus, nocturnal breathing issues: any intentions of pursuing studies for sleep apnea?   PLAN: 1.  At present no plans for EGD.  Colonoscopy would need doing 6 weeks post MI at the earliest. 2.  Stool guaics ordered.   LOS: 3 days   Jennye Moccasin  04/29/2011, 8:40 AM Pager: [phone]  I have personally reviewed the above note and agree with the plan of care. Will be available if any bleeding after cardiac cath [name], MD

## 2011-04-29 NOTE — Progress Notes (Signed)
Subjective:  Patient is still having some mild chest pain.  On IV NTG.  EKG shows NSR with PACs.  No acute changes. Cough and dyspnea responding to Lasix.  Objective:  Vital Signs in the last 24 hours: Temp:  [97.6 F (36.4 C)-98.7 F (37.1 C)] 97.6 F (36.4 C) (11/24 0522) Pulse Rate:  [57-71] 57  (11/24 0634) Resp:  [18-20] 20  (11/24 0522) BP: (127-166)/(63-80) 166/80 mmHg (11/24 0522) SpO2:  [99 %] 99 % (11/24 0522) Weight:  [243 lb 14.4 oz (110.632 kg)] 243 lb 14.4 oz (110.632 kg) (11/24 0537)  Intake/Output from previous day: 11/23 0701 - 11/24 0700 In: 480 [P.O.:480] Out: 1051 [Urine:1050; Stool:1] Intake/Output from this shift: Total I/O In: 240 [P.O.:240] Out: -      . aspirin EC  81 mg Oral Daily  . azithromycin  250 mg Oral Daily  . carvedilol  6.25 mg Oral BID WC  . diazepam  5 mg Oral On Call  . furosemide  20 mg Intravenous Once  . glimepiride  4 mg Oral BID  . guaiFENesin  600 mg Oral BID  . insulin aspart  0-15 Units Subcutaneous TID WC  . insulin aspart  0-5 Units Subcutaneous QHS  . linagliptin  5 mg Oral Daily  . lisinopril  5 mg Oral Daily  . pantoprazole  40 mg Oral Q0600  . pregabalin  50 mg Oral QHS  . DISCONTD: rosuvastatin  20 mg Oral QHS  . DISCONTD: sodium chloride  3 mL Intravenous Q12H      . nitroGLYCERIN 5 mcg/min (04/27/11 0636)  . DISCONTD: sodium chloride 250 mL (04/27/11 0354)  . DISCONTD: sodium chloride 50 mL/hr at 04/28/11 0940  . DISCONTD: eptifibatide 2 mcg/kg/min (04/28/11 0830)  . DISCONTD: heparin 1,400 Units/hr (04/27/11 1938)    Physical Exam: The patient appears to be in no distress.  Head and neck exam reveals that the pupils are equal and reactive.  The extraocular movements are full.  There is no scleral icterus.  Mouth and pharynx are benign.  No lymphadenopathy.  No carotid bruits.  The jugular venous pressure is normal.  Thyroid is not enlarged or tender.  Chest is clear to percussion and auscultation. Mild  wheezes.  Expansion of the chest is symmetrical.  Heart reveals no abnormal lift or heave.  First and second heart sounds are normal.  There is no murmur gallop rub or click.  The abdomen is soft and nontender.  Bowel sounds are normoactive.  There is no hepatosplenomegaly or mass.  There are no abdominal bruits.  Extremities reveal no phlebitis or edema.  Pedal pulses are good.  There is no cyanosis or clubbing.  Neurologic exam is normal strength and no lateralizing weakness.  No sensory deficits.  Integument reveals no rash  Lab Results:  Basename 04/29/11 0530 04/28/11 0832  WBC 6.2 6.2  HGB 8.6* 8.5*  PLT 139* 133*    Basename 04/29/11 0530 04/28/11 0832  NA 131* 131*  K 4.2 3.6  CL 98 98  CO2 24 25  GLUCOSE 105* 70  BUN 36* 33*  CREATININE 1.44* 1.65*    Basename 04/28/11 1149 04/27/11 1248  TROPONINI 1.01* 1.70*   Hepatic Function Panel  Basename 04/26/11 1515  PROT 7.8  ALBUMIN 3.2*  AST 23  ALT 16  ALKPHOS 56  BILITOT 0.3  BILIDIR 0.1  IBILI 0.2*    Basename 04/27/11 0920  CHOL 113   No results found for this basename: PROTIME in the  last 72 hours  Imaging: Imaging results have been reviewed  Cardiac Studies:  Assessment/Plan:  Patient Active Problem List  Diagnoses  . NSTEMI (non-ST elevated myocardial infarction)       Plan cath Monday if renal function and Hgb stable  . Diabetes mellitus  . Anemia       Serum iron less than 10. Family reports hx of diverticulosis.       Start FeSo4    . Dyslipidemia  . Nonproductive cough      Continue low dose Lasix  . Benign hypertensive heart disease     LOS: 3 days    Cassell Clement 04/29/2011, 9:52 AM

## 2011-04-30 LAB — BASIC METABOLIC PANEL
CO2: 21 mEq/L (ref 19–32)
GFR calc non Af Amer: 42 mL/min — ABNORMAL LOW (ref >90)
Glucose, Bld: 200 mg/dL — ABNORMAL HIGH (ref 70–99)
Potassium: 4.3 mEq/L (ref 3.5–5.1)
Sodium: 126 mEq/L — ABNORMAL LOW (ref 135–145)

## 2011-04-30 LAB — GLUCOSE, CAPILLARY
Glucose-Capillary: 72 mg/dL (ref 70–99)
Glucose-Capillary: 101 mg/dL — ABNORMAL HIGH (ref 70–99)
Glucose-Capillary: 124 mg/dL — ABNORMAL HIGH (ref 70–99)
Glucose-Capillary: 124 mg/dL — ABNORMAL HIGH (ref 70–99)

## 2011-04-30 NOTE — Progress Notes (Signed)
Subjective:  Patient complains of feeling tired today. She continues to have some vague atypical chest pain more on the left side than the right. Complains of mild shortness of breath. Still has some mild diarrhea. No plans as yet for colonoscopy. Vital signs stable over the past 24 hours.  Objective:  Vital Signs in the last 24 hours: BP 152/46  Pulse 57  Temp(Src) 98.3 F (36.8 C) (Oral)  Resp 18  Ht 5\' 2"  (1.575 m)  Wt 110.269 kg (243 lb 1.6 oz)  BMI 44.46 kg/m2  SpO2 99%  Physical Exam: Morbidly obese, elderly female in no acute distress lying in bed Lungs:  Clear to A&P Cardiac: Irregular rhythm  normal S1 and S2, no S3 Extremities:  No edema present  Intake/Output from previous day: 11/24 0701 - 11/25 0700 In: 1027.1 [P.O.:480; I.V.:547.1] Out: 3002 [Urine:3000; Stool:2]  Lab Results: Basic Metabolic Panel:  Basename 04/30/11 0530 04/29/11 0530  NA 126* 131*  K 4.3 4.2  CL 95* 98  CO2 21 24  GLUCOSE 200* 105*  BUN 35* 36*  CREATININE 1.27* 1.44*   CBC:  Basename 04/29/11 0530 04/28/11 0832  WBC 6.2 6.2  NEUTROABS -- --  HGB 8.6* 8.5*  HCT 25.9* 25.9*  MCV 85.5 87.2  PLT 139* 133*   Cardiac Enzymes:  Basename 04/28/11 1149 04/27/11 1248   CKTOTAL 1425* 1458*   CKMB 4.7* 3.7   CKMBINDEX -- --   TROPONINI 1.01* 1.70*    Telemetry: Reviewed: Sinus rhythm with occasional PACs.   Assessment/Plan:  1. Atypical chest pain with minor nonspecific ST changes noted on EKG. Troponins are mildly abnormal 2. Anemia iron deficient with hemoglobin low but recently stable 3. Hyponatremia questionable cause 4. Morbid obesity 5. Chronic kidney disease stage III with some improvement  RECOMMENDATIONS:  Tentatively plan cardiac catheterization tomorrow. We'll keep n.p.o. after midnight area she continues to have fairly significant anemia and also has hyponatremia. Repeat lab in the morning with catheterization pending the results.  Darden Palmer.  MD  [facility] 04/30/2011, 7:56 AM

## 2011-05-01 ENCOUNTER — Encounter (HOSPITAL_COMMUNITY)
Admission: EM | Disposition: A | Discharge status: Home Health | Payer: Self-pay | Source: Home / Self Care | Attending: Cardiology

## 2011-05-01 DIAGNOSIS — I2510 Atherosclerotic heart disease of native coronary artery without angina pectoris: Secondary | ICD-10-CM

## 2011-05-01 HISTORY — PX: LEFT HEART CATHETERIZATION WITH CORONARY ANGIOGRAM: SHX5451

## 2011-05-01 LAB — CK ISOENZYMES: CK-MM: 100 % (ref 95–100)

## 2011-05-01 LAB — CBC
MCH: 28.1 pg (ref 26.0–34.0)
Platelets: 160 10*3/uL (ref 150–400)
RBC: 2.92 MIL/uL — ABNORMAL LOW (ref 3.87–5.11)
RDW: 14.2 % (ref 11.5–15.5)

## 2011-05-01 LAB — BASIC METABOLIC PANEL
Calcium: 8.6 mg/dL (ref 8.4–10.5)
Creatinine, Ser: 1.19 mg/dL — ABNORMAL HIGH (ref 0.50–1.10)
GFR calc non Af Amer: 45 mL/min — ABNORMAL LOW (ref >90)
Glucose, Bld: 104 mg/dL — ABNORMAL HIGH (ref 70–99)
Sodium: 136 mEq/L (ref 135–145)

## 2011-05-01 LAB — GLUCOSE, CAPILLARY
Glucose-Capillary: 96 mg/dL (ref 70–99)
Glucose-Capillary: 107 mg/dL — ABNORMAL HIGH (ref 70–99)
Glucose-Capillary: 79 mg/dL (ref 70–99)

## 2011-05-01 SURGERY — LEFT HEART CATHETERIZATION WITH CORONARY ANGIOGRAM
Anesthesia: LOCAL

## 2011-05-01 MED ORDER — VERAPAMIL HCL 2.5 MG/ML IV SOLN
INTRAVENOUS | Status: AC
  Filled 2011-05-01: qty 2

## 2011-05-01 MED ORDER — SODIUM CHLORIDE 0.9 % IJ SOLN: 3.0000 mL | INTRAMUSCULAR | Status: DC | PRN

## 2011-05-01 MED ORDER — TRAMADOL HCL 50 MG PO TABS
50.0000 mg | ORAL_TABLET | Freq: Four times a day (QID) | ORAL | Status: DC | PRN
  Administered 2011-05-02 – 2011-05-05 (×2): 50 mg via ORAL
  Filled 2011-05-01 (×2): qty 1

## 2011-05-01 MED ORDER — SODIUM CHLORIDE 0.9 % IV SOLN: INTRAVENOUS | Status: AC

## 2011-05-01 MED ORDER — HEPARIN (PORCINE) IN NACL 2-0.9 UNIT/ML-% IJ SOLN
INTRAMUSCULAR | Status: AC
  Filled 2011-05-01: qty 2000

## 2011-05-01 MED ORDER — MIDAZOLAM HCL 2 MG/2ML IJ SOLN
INTRAMUSCULAR | Status: AC
  Filled 2011-05-01: qty 2

## 2011-05-01 MED ORDER — SODIUM CHLORIDE 0.9 % IJ SOLN: 3.0000 mL | Freq: Two times a day (BID) | INTRAMUSCULAR | Status: DC

## 2011-05-01 MED ORDER — ASPIRIN 81 MG PO CHEW: 324.0000 mg | CHEWABLE_TABLET | ORAL | Status: DC

## 2011-05-01 MED ORDER — DIAZEPAM 5 MG PO TABS
5.0000 mg | ORAL_TABLET | ORAL | Status: AC
  Administered 2011-05-01: 5 mg via ORAL
  Filled 2011-05-01: qty 1

## 2011-05-01 MED ORDER — SODIUM CHLORIDE 0.9 % IV SOLN
250.0000 mL | INTRAVENOUS | Status: DC
  Administered 2011-05-01: 250 mL via INTRAVENOUS

## 2011-05-01 MED ORDER — HEPARIN SODIUM (PORCINE) 1000 UNIT/ML IJ SOLN
INTRAMUSCULAR | Status: AC
  Filled 2011-05-01: qty 1

## 2011-05-01 MED ORDER — LIDOCAINE HCL (PF) 1 % IJ SOLN
INTRAMUSCULAR | Status: AC
  Filled 2011-05-01: qty 30

## 2011-05-01 MED ORDER — NITROGLYCERIN 0.2 MG/ML ON CALL CATH LAB
INTRAVENOUS | Status: AC
  Filled 2011-05-01: qty 1

## 2011-05-01 NOTE — Op Note (Signed)
Cardiac Catheterization Operative Report  [name] [id number] 11/26/20123:06 PM No primary provider on file.  Procedure Performed:  1. Left Heart Catheterization 2. Selective Coronary Angiography  Operator: Verne Carrow, MD  Indication: NSTEMI with iron deficiency anemia, no evidence of active bleeding. Atypical upper back pain. Diagnostic cath cancelled last week per Dr. Riley Kill for GI workup. Stools have been heme negative.                                       Procedure Details: The risks, benefits, complications, treatment options, and expected outcomes were discussed with the patient. The patient and/or family concurred with the proposed plan, giving informed consent. She has stable iron deficiency anemia with no source of bleeding. GI consult recommended EGD and colonoscopy in 6 weeks and to proceed with cardiac cath now.  The patient was brought to the cath lab after IV hydration was begun and oral premedication was given. The patient was further sedated with Versed. An Allens test was performed on the left wrist and was positive. The left wrist was prepped and draped in the usual manner. 1% Lidocaine was used for local anesthesia. Using the modified Seldinger access technique, a 5 French sheath was placed in the left radial artery. Verapamil 3 mg was given IV x 1 through the sheath. 4500 units IV heparin was given through the peripheral IV. Standard diagnostic catheters were used to perform selective coronary angiography. The JL 3.5 catheter selectively engaged the Circumflex. I then used a TIG 4.0 catheter to better image the LAD. A JR 4 catheter was used to engage and inject the RCA.  A pigtail catheter was used to cross the aortic valve. No left ventricular angiogram was performed. There were no immediate complications. The sheath was removed from the left radial artery and a Terumo hemostasis band was applied over the arteriotomy site. The patient was taken to the recovery area  in stable condition.   Coronary Interventions: None  Hemodynamic Findings: Central aortic pressure:183/52 Left ventricular pressure: 183/11/19  Angiographic Findings:  Left main:  Short segment, no disease.   Left Anterior Descending Artery: Diffuse calcification proximally with mild obstructive disease. The vessel quickly tapers to a small caliber vessel in the mid segment beyond the small diagonal branch. There is a 70% stenosis in the mid vessel just beyond the diagonal branch. The vessel is approximately 1.75 mm in the mid segment and tapers even further in the distal vessel.   Circumflex Artery: Large dominant vessel with 30% proximal stenosis. Large trifurcating marginal with mild plaque disease.  Right Coronary Artery: Small, non-dominant vessel with diffuse 95% stenosis in the mid segment. (1.25 mm vessel).   Left Ventricular Angiogram:  Not performed.    Impression: 1. Moderately severe stenosis in the small caliber mid LAD, too small for PCI. 2. Severe stenosis in small caliber non-dominant RCA, likely culprit. Too small for PCI.  3. Mild plaque Circumflex.    Recommendations:  Medical management at this time. Any intervention on the LAD would require a very small stent and possibly a rotablator for atherectomy. A drug eluting stent would be preferable but in this patient with anemia, a drug eluting stent would not be the best option. I am hopeful that we can control her symptoms with medications alone. Her RCA is too small for PCI.        Complications:  None; patient tolerated the  procedure well.

## 2011-05-01 NOTE — Progress Notes (Signed)
   SUBJECTIVE:  Pain behind the left shoulder.  No chest pain   PHYSICAL EXAM Filed Vitals:   04/30/11 0431 04/30/11 1429 04/30/11 2045 05/01/11 0437  BP: 152/46 95/53 90/49  161/57  Pulse: 57 90 60 56  Temp: 98.3 F (36.8 C) 97.2 F (36.2 C) 97.6 F (36.4 C) 97.8 F (36.6 C)  TempSrc: Oral Oral Oral Oral  Resp: 18 20 19 18   Height:      Weight: 243 lb 1.6 oz (110.269 kg)   240 lb 1.6 oz (108.909 kg)  SpO2: 99% 98% 99% 99%   General:  No distress Lungs:  Few wheezes Heart:  RRR, no murmur Abdomen:  Positive bowel sounds, no rebound no guarding Extremities:  No edema Neuro:  Intact HEENT:  PERRL  LABS: Lab Results  Component Value Date   CKTOTAL 1425* 04/28/2011   CKMB 4.7* 04/28/2011   TROPONINI 1.01* 04/28/2011   Results for orders placed during the hospital encounter of 04/26/11 (from the past 24 hour(s))  GLUCOSE, CAPILLARY     Status: Abnormal   Collection Time   04/30/11 11:38 AM      Component Value Range   Glucose-Capillary 101 (*) 70 - 99 (mg/dL)   Comment 1 Documented in Chart     Comment 2 Notify RN    GLUCOSE, CAPILLARY     Status: Abnormal   Collection Time   04/30/11  4:43 PM      Component Value Range   Glucose-Capillary 124 (*) 70 - 99 (mg/dL)   Comment 1 Documented in Chart     Comment 2 Notify RN    GLUCOSE, CAPILLARY     Status: Abnormal   Collection Time   04/30/11  9:31 PM      Component Value Range   Glucose-Capillary 124 (*) 70 - 99 (mg/dL)   Comment 1 Documented in Chart     Comment 2 Notify RN    GLUCOSE, CAPILLARY     Status: Normal   Collection Time   05/01/11  6:10 AM      Component Value Range   Glucose-Capillary 96  70 - 99 (mg/dL)   Comment 1 Documented in Chart     Comment 2 Notify RN      Intake/Output Summary (Last 24 hours) at 05/01/11 0800 Last data filed at 05/01/11 0740  Gross per 24 hour  Intake 1377.5 ml  Output   2400 ml  Net -1022.5 ml   ASSESSMENT AND PLAN:  Principal Problem:  *NSTEMI (non-ST  elevated myocardial infarction):  Cath today pending the BMET.   Active Problems:  Diabetes mellitus:  Continue current meds, holding Metformin  Anemia:  Follow post procedure CBC  Dyslipidemia:  LDL was 38 this admit.  No change in therapy.  Nonproductive cough:  Completed Zithromax.   Benign hypertensive heart disease without heart failure  Hyponatremia:  Hold Lasix.  BMET pending    Rollene Rotunda 05/01/2011 8:00 AM

## 2011-05-01 NOTE — H&P (View-Only) (Signed)
   SUBJECTIVE:  Pain behind the left shoulder.  No chest pain   PHYSICAL EXAM Filed Vitals:   04/30/11 0431 04/30/11 1429 04/30/11 2045 05/01/11 0437  BP: 152/46 95/53 90/49 161/57  Pulse: 57 90 60 56  Temp: 98.3 F (36.8 C) 97.2 F (36.2 C) 97.6 F (36.4 C) 97.8 F (36.6 C)  TempSrc: Oral Oral Oral Oral  Resp: 18 20 19 18  Height:      Weight: 243 lb 1.6 oz (110.269 kg)   240 lb 1.6 oz (108.909 kg)  SpO2: 99% 98% 99% 99%   General:  No distress Lungs:  Few wheezes Heart:  RRR, no murmur Abdomen:  Positive bowel sounds, no rebound no guarding Extremities:  No edema Neuro:  Intact HEENT:  PERRL  LABS: Lab Results  Component Value Date   CKTOTAL 1425* 04/28/2011   CKMB 4.7* 04/28/2011   TROPONINI 1.01* 04/28/2011   Results for orders placed during the hospital encounter of 04/26/11 (from the past 24 hour(s))  GLUCOSE, CAPILLARY     Status: Abnormal   Collection Time   04/30/11 11:38 AM      Component Value Range   Glucose-Capillary 101 (*) 70 - 99 (mg/dL)   Comment 1 Documented in Chart     Comment 2 Notify RN    GLUCOSE, CAPILLARY     Status: Abnormal   Collection Time   04/30/11  4:43 PM      Component Value Range   Glucose-Capillary 124 (*) 70 - 99 (mg/dL)   Comment 1 Documented in Chart     Comment 2 Notify RN    GLUCOSE, CAPILLARY     Status: Abnormal   Collection Time   04/30/11  9:31 PM      Component Value Range   Glucose-Capillary 124 (*) 70 - 99 (mg/dL)   Comment 1 Documented in Chart     Comment 2 Notify RN    GLUCOSE, CAPILLARY     Status: Normal   Collection Time   05/01/11  6:10 AM      Component Value Range   Glucose-Capillary 96  70 - 99 (mg/dL)   Comment 1 Documented in Chart     Comment 2 Notify RN      Intake/Output Summary (Last 24 hours) at 05/01/11 0800 Last data filed at 05/01/11 0740  Gross per 24 hour  Intake 1377.5 ml  Output   2400 ml  Net -1022.5 ml   ASSESSMENT AND PLAN:  Principal Problem:  *NSTEMI (non-ST  elevated myocardial infarction):  Cath today pending the BMET.   Active Problems:  Diabetes mellitus:  Continue current meds, holding Metformin  Anemia:  Follow post procedure CBC  Dyslipidemia:  LDL was 38 this admit.  No change in therapy.  Nonproductive cough:  Completed Zithromax.   Benign hypertensive heart disease without heart failure  Hyponatremia:  Hold Lasix.  BMET pending    [name] 05/01/2011 8:00 AM   

## 2011-05-01 NOTE — Interval H&P Note (Signed)
History and Physical Interval Note:   05/01/2011   2:06 PM   The patient is here today for a left heart catheterization with possible percutaneous coronary intervention. I have personally reviewed the labs and examined the patient. The History and Physical has been reviewed and there are no changes. The risks and benefits of the procedure have been reviewed with the patient. Informed consent has been signed by the patient and is present in the chart. The patient agrees to proceed with the procedure. I spent 15 minutes discussing the case with the patient and her daughter. She is anemic. Her Hgb has been stable at 8.5 to 8.2 over the last three days. She was evaluated this am by Dr. Antoine Poche and the decision was made to proceed with the cardiac cath. I have discussed potential for bleeding during the cath, especially if anticoagulation and antiplatelet therapy is required. She wishes to proceed. She has been seen by GI for her iron deficiency anemia and cardiology has been told to proceed with the cath and PCI if necessary.   [name] 2:10 PM   Brenda Mcglinn  MD

## 2011-05-01 NOTE — Clinical Documentation Improvement (Signed)
CHF DOCUMENTATION CLARIFICATION QUERY  Please update your documentation within the medical record to reflect your response to this query.                                                                                     05/01/11  Dear Dr.Shawnita K[facility]/ [facility],  In a better effort to capture your patient's severity of illness, reflect appropriate length of stay and utilization of resources, a review of the patient medical record has revealed the following indicators the diagnosis of Heart Failure.    Based on your clinical judgment, please clarify and document in a progress note and/or discharge summary the clinical condition associated with the following supporting information:  In responding to this query please exercise your independent judgment.  The fact that a query is asked, does not imply that any particular answer is desired or expected.  Possible Clinical Conditions?  Chronic Systolic Congestive Heart Failure Chronic Diastolic Congestive Heart Failure Chronic Systolic & Diastolic Congestive Heart Failure Acute Systolic Congestive Heart Failure Acute Diastolic Congestive Heart Failure Acute Systolic & Diastolic Congestive Heart Failure Acute on Chronic Systolic Congestive Heart Failure Acute on Chronic Diastolic Congestive Heart Failure Acute on Chronic Systolic & Diastolic  Congestive Heart Failure Other Condition________________________________________ Cannot Clinically Determine  Supporting Information:  Risk Factors:  Mild CHF, mild interstitial edema noted per 11/23 progress note. CXR results 11/22: vascular congestion has increased, increased interstitial edema.    Reviewed:  no additional documentation provided  Thank You,  Sincerely, Marciano Sequin,  Clinical Documentation Specialist: Pager: [phone]  Health Information Management [employer]  Unable to determine at this point.  Need results of cardiac catheterization or echo to determine  left ventricular ejection fraction

## 2011-05-02 ENCOUNTER — Encounter (HOSPITAL_COMMUNITY): Payer: Self-pay | Admitting: General Practice

## 2011-05-02 ENCOUNTER — Encounter (HOSPITAL_COMMUNITY): Payer: Self-pay

## 2011-05-02 LAB — CBC
HCT: 27.1 % — ABNORMAL LOW (ref 36.0–46.0)
Hemoglobin: 8.8 g/dL — ABNORMAL LOW (ref 12.0–15.0)
MCH: 28.4 pg (ref 26.0–34.0)
MCHC: 32.5 g/dL (ref 30.0–36.0)
MCV: 87.4 fL (ref 78.0–100.0)

## 2011-05-02 LAB — BASIC METABOLIC PANEL
BUN: 30 mg/dL — ABNORMAL HIGH (ref 6–23)
Chloride: 107 mEq/L (ref 96–112)
GFR calc non Af Amer: 50 mL/min — ABNORMAL LOW (ref >90)
Glucose, Bld: 101 mg/dL — ABNORMAL HIGH (ref 70–99)
Potassium: 4.7 mEq/L (ref 3.5–5.1)

## 2011-05-02 LAB — GLUCOSE, CAPILLARY
Glucose-Capillary: 106 mg/dL — ABNORMAL HIGH (ref 70–99)
Glucose-Capillary: 250 mg/dL — ABNORMAL HIGH (ref 70–99)
Glucose-Capillary: 154 mg/dL — ABNORMAL HIGH (ref 70–99)
Glucose-Capillary: 187 mg/dL — ABNORMAL HIGH (ref 70–99)

## 2011-05-02 NOTE — Progress Notes (Signed)
Clinical Social Worker completed a psychosocial assessment, which can be found in the shadow chart. Patient appeared to have hesitations about receiving therapy at a ST-SNF due to her concerns about paying for the rehab. 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 I don't want them to take my money." CSW answered patient's questions and encouraged the patient to try calling her insurance to inquire about her financial responsibilities at a ST-SNF. Patient attempted to call multiple times and became emotional after she went through the automated responses, because it "kept hanging up on me." CSW provided emotional support. CSW encouraged the patient to try one more time with the CSW dialing for her and CSW helped the patient connect to a live representative. Patient gave permission for CSW to begin a SNF search and CSW will follow up with patient to discuss bed offers as they become available.  Rozetta Nunnery, MSW, [employer] [phone]

## 2011-05-02 NOTE — Plan of Care (Signed)
Problem: Discharge Progression Outcomes Goal: Discharge plan in place and appropriate Outcome: Progressing OT recommending ST-SNF at D/C and will follow acutely. Will follow acutely. Thanks-  Cassandria Anger, OTR/L Pager: [phone] 05/02/2011 .

## 2011-05-02 NOTE — Progress Notes (Signed)
Physical Therapy Evaluation Patient Details Name: [name] MRN: [id number] DOB: Jul 02, 1940 Today's Date: 05/02/2011  Problem List:  Patient Active Problem List  Diagnoses  . NSTEMI (non-ST elevated myocardial infarction)  . Diabetes mellitus  . Anemia  . Dyslipidemia  . Nonproductive cough  . Benign hypertensive heart disease without heart failure    Past Medical History:  Past Medical History  Diagnosis Date  . Diabetes mellitus   . Hypertension   . Hyperlipemia   . Fibromyalgia   . Obesity   . Angina   . Sleep apnea   . Anxiety   . Hiatal hernia   . NSTEMI (non-ST elevated myocardial infarction) 04/27/2011  . Anemia 04/27/2011  . Restless leg syndrome    Past Surgical History:  Past Surgical History  Procedure Date  . Appendectomy     PT Assessment/Plan/Recommendation PT Assessment Clinical Impression Statement: pt is a 70 y/o female adm with weakness and SOB, determined to have suffered a NSTEMI and suggested medical management per CATH. pt can benefit from acute Pt and Snf with rehab to address weakness decr balance and decr activity tolerance PT Recommendation/Assessment: Patient will need skilled PT in the acute care venue PT Problem List: Decreased strength;Decreased activity tolerance;Decreased balance;Decreased mobility;Decreased knowledge of use of DME;Cardiopulmonary status limiting activity Barriers to Discharge: Decreased caregiver support PT Therapy Diagnosis : Generalized weakness;Difficulty walking PT Plan PT Frequency: Min 3X/week PT Treatment/Interventions: Gait training;Stair training;Therapeutic exercise;Balance training;Patient/family education;Functional mobility training PT Recommendation Follow Up Recommendations: Skilled nursing facility Equipment Recommended: Defer to next venue PT Goals  Acute Rehab PT Goals PT Goal Formulation: With patient Time For Goal Achievement: 7 days;2 weeks Pt will go Supine/Side to Sit: with  supervision PT Goal: Supine/Side to Sit - Progress: Other (comment) Pt will Transfer Sit to Stand/Stand to Sit: with supervision PT Transfer Goal: Sit to Stand/Stand to Sit - Progress: Other (comment) Pt will Transfer Bed to Chair/Chair to Bed: with supervision PT Transfer Goal: Bed to Chair/Chair to Bed - Progress: Other (comment) Pt will Ambulate: >150 feet;with supervision;with least restrictive assistive device PT Goal: Ambulate - Progress: Other (comment) Pt will Go Up / Down Stairs: 1-2 stairs;with supervision;with rail(s) PT Goal: Up/Down Stairs - Progress: Other (comment)  PT Evaluation Precautions/Restrictions  Precautions Precautions: Fall Restrictions Weight Bearing Restrictions: No Prior Functioning  Home Living Lives With: Other (Comment);Son (three boys) Receives Help From: Family Type of Home: House Home Layout: One level Home Access: Stairs to enter Entrance Stairs-Rails: Can reach both Entrance Stairs-Number of Steps: 2 Bathroom Shower/Tub: Forensic scientist: Standard Bathroom Accessibility: Yes How Accessible: Accessible via walker Home Adaptive Equipment: Crutches Prior Function Level of Independence: Independent with basic ADLs;Independent with homemaking with ambulation;Independent with gait;Independent with transfers Able to Take Stairs?: Yes Driving: Yes Vocation: Retired Producer, television/film/video: Awake/alert Overall Cognitive Status: Appears within functional limits for tasks assessed Orientation Level: Oriented X4 Sensation/Coordination Coordination Gross Motor Movements are Fluid and Coordinated: Yes Extremity Assessment RUE Assessment RUE Assessment: Within Functional Limits LUE Assessment LUE Assessment: Within Functional Limits RLE Assessment RLE Assessment: Within Functional Limits LLE Assessment LLE Assessment: Within Functional Limits (LLE was weaker than R; both were weak at 3+to 4-/5) Mobility  (including Balance) Bed Mobility Bed Mobility: No Transfers Transfers: Yes Sit to Stand: 4: Min assist;With armrests Sit to Stand Details (indicate cue type and reason): vc's for hand placement Stand to Sit: 4: Min assist;To chair/3-in-1;With armrests Stand to Sit Details: vc's for hand placement Ambulation/Gait Ambulation/Gait: Yes Ambulation/Gait  Assistance: Other (comment) (min guard A) Ambulation Distance (Feet): 80 Feet Assistive device: Rolling walker Gait Pattern: Within Functional Limits Stairs: No  Balance Balance Assessed:  (generally unsteady with RW) Exercise    End of Session PT - End of Session Activity Tolerance: Patient limited by fatigue Patient left: in chair Nurse Communication: Mobility status for ambulation General Behavior During Session: [facility] for tasks performed Cognition: [facility] for tasks performed  Kekai Geter, Eliseo Gum 05/02/2011, 12:54 PM  05/02/2011  Fulda Bing, PT [phone] (pager)

## 2011-05-02 NOTE — Progress Notes (Signed)
SUBJECTIVE:  She denies any ongoing chest pain.  She reports that the pain in her left shoulder is gone.  She denies any SOB or weakness.   PHYSICAL EXAM Filed Vitals:   05/02/11 1125 05/02/11 1150 05/02/11 1200 05/02/11 1626  BP: 120/88 100/46  95/44  Pulse:   48 54  Temp:    97.9 F (36.6 C)  TempSrc:    Oral  Resp:    20  Height:      Weight:      SpO2:    99%   General:  No distress Lungs:  Clear Heart:  RRR, no murmur Abdomen:  Obese Extremities:  No edema.  Left wrist without hematoma or echymosis  LABS: Lab Results  Component Value Date   CKTOTAL 1425* 04/28/2011   CKMB 4.7* 04/28/2011   TROPONINI 1.01* 04/28/2011   Results for orders placed during the hospital encounter of 04/26/11 (from the past 24 hour(s))  GLUCOSE, CAPILLARY     Status: Abnormal   Collection Time   05/01/11  5:31 PM      Component Value Range   Glucose-Capillary 150 (*) 70 - 99 (mg/dL)  GLUCOSE, CAPILLARY     Status: Abnormal   Collection Time   05/01/11  9:44 PM      Component Value Range   Glucose-Capillary 109 (*) 70 - 99 (mg/dL)   Comment 1 Documented in Chart     Comment 2 Notify RN    BASIC METABOLIC PANEL     Status: Abnormal   Collection Time   05/02/11  5:15 AM      Component Value Range   Sodium 138  135 - 145 (mEq/L)   Potassium 4.7  3.5 - 5.1 (mEq/L)   Chloride 107  96 - 112 (mEq/L)   CO2 23  19 - 32 (mEq/L)   Glucose, Bld 101 (*) 70 - 99 (mg/dL)   BUN 30 (*) 6 - 23 (mg/dL)   Creatinine, Ser 1.61  0.50 - 1.10 (mg/dL)   Calcium 8.7  8.4 - 09.6 (mg/dL)   GFR calc non Af Amer 50 (*) >90 (mL/min)   GFR calc Af Amer 58 (*) >90 (mL/min)  CBC     Status: Abnormal   Collection Time   05/02/11  5:15 AM      Component Value Range   WBC 6.1  4.0 - 10.5 (K/uL)   RBC 3.10 (*) 3.87 - 5.11 (MIL/uL)   Hemoglobin 8.8 (*) 12.0 - 15.0 (g/dL)   HCT 04.5 (*) 40.9 - 46.0 (%)   MCV 87.4  78.0 - 100.0 (fL)   MCH 28.4  26.0 - 34.0 (pg)   MCHC 32.5  30.0 - 36.0 (g/dL)   RDW 81.1   91.4 - 78.2 (%)   Platelets 182  150 - 400 (K/uL)  GLUCOSE, CAPILLARY     Status: Abnormal   Collection Time   05/02/11  7:56 AM      Component Value Range   Glucose-Capillary 106 (*) 70 - 99 (mg/dL)  GLUCOSE, CAPILLARY     Status: Abnormal   Collection Time   05/02/11  3:03 PM      Component Value Range   Glucose-Capillary 250 (*) 70 - 99 (mg/dL)    Intake/Output Summary (Last 24 hours) at 05/02/11 1641 Last data filed at 05/02/11 1500  Gross per 24 hour  Intake   1178 ml  Output   2125 ml  Net   -947 ml    ASSESSMENT AND  PLAN:  Principal Problem:  *NSTEMI (non-ST elevated myocardial infarction):   Medical management as per the cath note Active Problems:  Diabetes mellitus:  Labile blood sugars.  Continue current therapy  Anemia:  Repeat labs in AM, check stool guaiac.  I will consult GI in the AM.    Dyslipidemia  Nonproductive cough  Benign hypertensive heart disease without heart failure  Hyponatremia:  Repeat BMET in am.  Free water restrict.  Back pain.  Unclear etiology.  Resolved.   Fayrene Fearing [facility] 05/02/2011 4:41 PM

## 2011-05-02 NOTE — Progress Notes (Signed)
CARDIAC REHAB PHASE I   PRE:  Rate/Rhythm: 56 SB with PACs    BP: lying 116/73   SaO2: 100 RA  MODE:  Ambulation: 100 ft   POST:  Rate/Rhythm: 64 SR    BP: sitting 97/60     SaO2: 100  RA  Wrote order for MI. Tolerated fair, assist x1 with RW.  Pt DOE with x1 rest stop. Very deconditioned. Also had one episode of dizziness that resolved quickly. Denied CP or extreme SOB. Tired after walk, to recliner.  BP decreased after walk. MI ed completed. Pt needs RW for home. PT to see later. 0850-1000  Harriet Masson CES, ACSM

## 2011-05-02 NOTE — Progress Notes (Signed)
NTG drip stopped d/t low BP. Stopped at 2015.

## 2011-05-02 NOTE — Progress Notes (Signed)
Pt admitted with sob and cp-nstemi. Pt post cath. She has family support of sisters and sons. CM spoke to pt and sister and they are agreeable to SNF for short term rehab.CM will consult CSW. CM will continue to f/u for additional d/c needs. CM did place a referral with leslie Judkins CSW. CM spoke to Pt and sister and they are both agreeable to facility. Trying to decide if pt will go to [place] or [place] area. Gala Lewandowsky, RN,BSN, [place] 9147 05-02-11

## 2011-05-02 NOTE — Progress Notes (Signed)
Occupational Therapy Evaluation Patient Details Name: FREDRICK DRAY MRN: [id number] DOB: 1940/12/30 Today's Date: 05/02/2011  Problem List:  Patient Active Problem List  Diagnoses  . NSTEMI (non-ST elevated myocardial infarction)  . Diabetes mellitus  . Anemia  . Dyslipidemia  . Nonproductive cough  . Benign hypertensive heart disease without heart failure    Past Medical History:  Past Medical History  Diagnosis Date  . Diabetes mellitus   . Hypertension   . Hyperlipemia   . Fibromyalgia   . Obesity   . Angina   . Sleep apnea   . Anxiety   . Hiatal hernia   . NSTEMI (non-ST elevated myocardial infarction) 04/27/2011  . Anemia 04/27/2011  . Restless leg syndrome    Past Surgical History:  Past Surgical History  Procedure Date  . Appendectomy     OT Assessment/Plan/Recommendation OT Assessment Clinical Impression Statement: Pt. will benefit from OT to increase independence with ADLs and decrease burden of care at next venue of care OT Recommendation/Assessment: Patient will need skilled OT in the acute care venue OT Problem List: Decreased strength;Decreased activity tolerance;Impaired balance (sitting and/or standing);Decreased safety awareness Barriers to Discharge: Decreased caregiver support OT Therapy Diagnosis : Generalized weakness OT Plan OT Frequency: Min 1X/week OT Treatment/Interventions: Self-care/ADL training;DME and/or AE instruction;Therapeutic activities;Patient/family education;Balance training OT Recommendation Follow Up Recommendations: Skilled nursing facility Equipment Recommended: Defer to next venue Individuals Consulted Consulted and Agree with Results and Recommendations: Patient OT Goals Acute Rehab OT Goals OT Goal Formulation: With patient Time For Goal Achievement: 2 weeks ADL Goals Pt Will Perform Grooming: with set-up;with supervision;Standing at sink ADL Goal: Grooming - Progress: Progressing toward goals Pt Will Perform Upper  Body Bathing: with set-up;Sitting, edge of bed ADL Goal: Upper Body Bathing - Progress: Progressing toward goals Pt Will Perform Upper Body Dressing: with set-up;with supervision;Sitting, bed ADL Goal: Upper Body Dressing - Progress: Progressing toward goals Pt Will Perform Lower Body Dressing: with set-up;with supervision;with adaptive equipment;Sit to stand from bed ADL Goal: Lower Body Dressing - Progress: Progressing toward goals Pt Will Transfer to Toilet: with set-up;with supervision;Stand pivot transfer ADL Goal: Toilet Transfer - Progress: Progressing toward goals Pt Will Perform Toileting - Hygiene: with set-up;with adaptive equipment;Sit to stand from 3-in-1/toilet ADL Goal: Toileting - Hygiene - Progress: Not addressed  OT Evaluation Precautions/Restrictions  Precautions Precautions: Fall Restrictions Weight Bearing Restrictions: No Prior Functioning Home Living Lives With: Other (Comment);Son (three boys) Receives Help From: Family Type of Home: House Home Layout: One level Home Access: Stairs to enter Entrance Stairs-Rails: Can reach both Entrance Stairs-Number of Steps: 2 Bathroom Shower/Tub: Forensic scientist: Standard Bathroom Accessibility: Yes How Accessible: Accessible via walker Home Adaptive Equipment: Crutches Prior Function Level of Independence: Independent with basic ADLs;Independent with homemaking with ambulation;Independent with gait;Independent with transfers Able to Take Stairs?: Yes Driving: Yes Vocation: Retired ADL ADL Eating/Feeding: Simulated;Set up Where Assessed - Eating/Feeding: Chair Grooming: Simulated;Wash/dry hands;Set up;Minimal assistance Grooming Details (indicate cue type and reason): Pt. simulated standing at the sink Where Assessed - Grooming: Standing at sink Upper Body Bathing: Not assessed Lower Body Bathing: Not assessed Upper Body Dressing: Performed;Set up Upper Body Dressing Details (indicate  cue type and reason): Donning gown Where Assessed - Upper Body Dressing: Sitting, chair Lower Body Dressing: Not assessed Toilet Transfer: Performed;Minimal assistance Toilet Transfer Details (indicate cue type and reason): Min verbal cues for hand placement on arm rests Toilet Transfer Method: Stand pivot Toilet Transfer Equipment: Bedside commode Toileting - Clothing Manipulation: Simulated;Minimal  assistance Where Assessed - Toileting Clothing Manipulation: Sit to stand from 3-in-1 or toilet Toileting - Hygiene: Simulated;+1 Total assistance Where Assessed - Toileting Hygiene: Sit to stand from 3-in-1 or toilet Tub/Shower Transfer: Not assessed Tub/Shower Transfer Method: Not assessed Equipment Used: Rolling walker ADL Comments: Pt. completed ambulation in hallway and room with min assist with min verbal cues for manuvering RW Vision/Perception  Vision - History Baseline Vision: Wears glasses all the time Patient Visual Report: No change from baseline Vision - Assessment Eye Alignment: Within Functional Limits Vision Assessment: Vision not tested Cognition Cognition Arousal/Alertness: Awake/alert Overall Cognitive Status: Appears within functional limits for tasks assessed Orientation Level: Oriented X4 Sensation/Coordination Coordination Gross Motor Movements are Fluid and Coordinated: Yes Extremity Assessment RUE Assessment RUE Assessment: Within Functional Limits LUE Assessment LUE Assessment: Within Functional Limits Mobility  Bed Mobility Bed Mobility: No Transfers Transfers: Yes Sit to Stand: 4: Min assist;With armrests Sit to Stand Details (indicate cue type and reason): vc's for hand placement Stand to Sit: 4: Min assist;To chair/3-in-1;With armrests Stand to Sit Details: vc's for hand placement    End of Session OT - End of Session Equipment Utilized During Treatment: Gait belt Activity Tolerance: Patient tolerated treatment well Patient left: in  chair;with call bell in reach Nurse Communication: Mobility status for transfers General Behavior During Session: [facility] for tasks performed Cognition: [facility] for tasks performed  Co-evaluation with Flora Lipps, OTR/L Pager [phone] 05/02/2011, 12:49 PM

## 2011-05-03 ENCOUNTER — Inpatient Hospital Stay (HOSPITAL_COMMUNITY): Payer: Medicare Other

## 2011-05-03 DIAGNOSIS — I2510 Atherosclerotic heart disease of native coronary artery without angina pectoris: Secondary | ICD-10-CM

## 2011-05-03 DIAGNOSIS — R1310 Dysphagia, unspecified: Secondary | ICD-10-CM

## 2011-05-03 DIAGNOSIS — D509 Iron deficiency anemia, unspecified: Secondary | ICD-10-CM

## 2011-05-03 LAB — BASIC METABOLIC PANEL
CO2: 23 mEq/L (ref 19–32)
GFR calc non Af Amer: 47 mL/min — ABNORMAL LOW (ref >90)
Glucose, Bld: 138 mg/dL — ABNORMAL HIGH (ref 70–99)
Potassium: 4.8 mEq/L (ref 3.5–5.1)
Sodium: 133 mEq/L — ABNORMAL LOW (ref 135–145)

## 2011-05-03 LAB — CBC
Hemoglobin: 8.3 g/dL — ABNORMAL LOW (ref 12.0–15.0)
RBC: 3 MIL/uL — ABNORMAL LOW (ref 3.87–5.11)

## 2011-05-03 LAB — GLUCOSE, CAPILLARY
Glucose-Capillary: 130 mg/dL — ABNORMAL HIGH (ref 70–99)
Glucose-Capillary: 143 mg/dL — ABNORMAL HIGH (ref 70–99)
Glucose-Capillary: 154 mg/dL — ABNORMAL HIGH (ref 70–99)

## 2011-05-03 MED ORDER — ISOSORBIDE MONONITRATE ER 30 MG PO TB24
30.0000 mg | ORAL_TABLET | Freq: Every day | ORAL | Status: DC
  Administered 2011-05-03 – 2011-05-06 (×4): 30 mg via ORAL
  Filled 2011-05-03 (×4): qty 1

## 2011-05-03 NOTE — Progress Notes (Signed)
CARDIAC REHAB PHASE I   PRE:  Rate/Rhythm: 57SBPACs  BP:  Supine:   Sitting: 118/29  Standing:    SaO2: 95%RA  MODE:  Ambulation: 88 ft   POST:  Rate/Rhythem: 78SR  BP:  Supine:   Sitting: 113/62  Standing:    SaO2: 100%RA  Pt walked 45ft on RA with rolling walker and asst x l. Tired easily and had to rest frequently with standing rest breaks. C/o back of legs weak.  Pt would  Like a rolling walker for home use. To chair after walk. [id number]  [name]

## 2011-05-03 NOTE — Progress Notes (Signed)
SUBJECTIVE:  Some back pain.  Mild non productive cough   PHYSICAL EXAM Filed Vitals:   05/03/11 0040 05/03/11 0130 05/03/11 0230 05/03/11 0500  BP: 88/44 94/41 135/67   Pulse: 87 78 42   Temp:      TempSrc:      Resp:      Height:      Weight:    107.8 kg (237 lb 10.5 oz)  SpO2: 100% 96% 97%    General:  No distress Lungs:  Clear Heart:  RRR Abdomen:  Positive bowel sounds, no rebound no guarding Extremities:  No edema Neuro:  Non focal HEENT:  PERRL  LABS: Lab Results  Component Value Date   CKTOTAL 1425* 04/28/2011   CKMB 4.7* 04/28/2011   TROPONINI 1.01* 04/28/2011   Results for orders placed during the hospital encounter of 04/26/11 (from the past 24 hour(s))  GLUCOSE, CAPILLARY     Status: Abnormal   Collection Time   05/02/11  7:56 AM      Component Value Range   Glucose-Capillary 106 (*) 70 - 99 (mg/dL)  GLUCOSE, CAPILLARY     Status: Abnormal   Collection Time   05/02/11  3:03 PM      Component Value Range   Glucose-Capillary 250 (*) 70 - 99 (mg/dL)  GLUCOSE, CAPILLARY     Status: Abnormal   Collection Time   05/02/11  5:39 PM      Component Value Range   Glucose-Capillary 154 (*) 70 - 99 (mg/dL)  GLUCOSE, CAPILLARY     Status: Abnormal   Collection Time   05/02/11  9:51 PM      Component Value Range   Glucose-Capillary 187 (*) 70 - 99 (mg/dL)   Comment 1 Documented in Chart     Comment 2 Notify RN    CBC     Status: Abnormal   Collection Time   05/03/11  4:35 AM      Component Value Range   WBC 7.0  4.0 - 10.5 (K/uL)   RBC 3.00 (*) 3.87 - 5.11 (MIL/uL)   Hemoglobin 8.3 (*) 12.0 - 15.0 (g/dL)   HCT 14.7 (*) 82.9 - 46.0 (%)   MCV 87.0  78.0 - 100.0 (fL)   MCH 27.7  26.0 - 34.0 (pg)   MCHC 31.8  30.0 - 36.0 (g/dL)   RDW 56.2  13.0 - 86.5 (%)   Platelets 185  150 - 400 (K/uL)  BASIC METABOLIC PANEL     Status: Abnormal   Collection Time   05/03/11  4:35 AM      Component Value Range   Sodium 133 (*) 135 - 145 (mEq/L)   Potassium 4.8   3.5 - 5.1 (mEq/L)   Chloride 103  96 - 112 (mEq/L)   CO2 23  19 - 32 (mEq/L)   Glucose, Bld 138 (*) 70 - 99 (mg/dL)   BUN 30 (*) 6 - 23 (mg/dL)   Creatinine, Ser 7.84 (*) 0.50 - 1.10 (mg/dL)   Calcium 8.8  8.4 - 69.6 (mg/dL)   GFR calc non Af Amer 47 (*) >90 (mL/min)   GFR calc Af Amer 55 (*) >90 (mL/min)    Intake/Output Summary (Last 24 hours) at 05/03/11 2952 Last data filed at 05/03/11 0646  Gross per 24 hour  Intake 1124.67 ml  Output   2375 ml  Net -1250.33 ml    ASSESSMENT AND PLAN:  Principal Problem: 1)  *NSTEMI (non-ST elevated myocardial infarction):  Mildly elevated Troponin.  Cath as reported.  Medical management.  Stop IV nitro and start Imdur.  Continue other meds as listed. Active Problems: 2) Diabetes mellitus:  Glucose has been somewhat labile but A1c was reasonable on admission.  This will be followed by her primary doctor. 3) Anemia:  Iron deficiency.  Hgb has fallen during admission.  With this and the pain I will consult GI.  Guaiac has been ordered.   4) Dyslipidemia:  Continue current therapy. 5) Back pain:  Returned slightly.  Evaluate as above. 6) Hyponatremia:  Na improved 7) Cough:  Chest CXR  Rollene Rotunda 05/03/2011 7:30 AM

## 2011-05-03 NOTE — Progress Notes (Addendum)
Clinical Social Worker completed the [facility] and placement note, which can be found in the shadow chart. MD will need to sign the FL2. CSW will update patient with bed offers when they become available for the ST-SNF.   CSW updated patient with the bed offers received so far in [facility]. and patient gave CSW permission to expand the search to [employer].  Rozetta Nunnery, MSW, [employer] [phone]

## 2011-05-03 NOTE — Progress Notes (Signed)
NTG drip turned back on at 0400 for BP of 161/33.  Set for 34mcg/min.

## 2011-05-03 NOTE — Clinical Documentation Improvement (Signed)
CHF DOCUMENTATION CLARIFICATION QUERY  THIS DOCUMENT IS NOT A PERMANENT PART OF THE MEDICAL RECORD  Please update your documentation within the medical record to reflect your response to this query. Instructions for responding to this Query are found below. Thank you.                                                                                     05/03/11  Dear Dr.Hochrein / Associates,  In a better effort to capture your patient's severity of illness, reflect appropriate length of stay and utilization of resources, a review of the patient medical record has revealed the following indicators the diagnosis of Heart Failure.    In responding to this query please exercise your independent judgment.  The fact that a query is asked, does not imply that any particular answer is desired or expected.  Possible Clinical Conditions?  Acute Systolic Congestive Heart Failure Acute Diastolic Congestive Heart Failure Acute Systolic & Diastolic Congestive Heart Failure Acute on Chronic Systolic Congestive Heart Failure Acute on Chronic Diastolic Congestive Heart Failure Acute on Chronic Systolic & Diastolic  Congestive Heart Failure Other Condition________________________________________ Cannot Clinically Determine  Supporting Information:  Risk Factors: Mild CHF, mild interstitial edema noted per 11/23 progress note. CXR results 11/22: vascular congestion has increased, increased interstitial edema, 11/28 Mild pulmonary congestion.   Reviewed:  No specificity regarding CHF documented.   Thank You,  Sincerely, Beverley Fiedler RN   Clinical Documentation Specialist: Cell:  [phone]  Health Information Management [place]  TO RESPOND TO THE THIS QUERY, FOLLOW THE INSTRUCTIONS BELOW:  1. If needed, update documentation for the patient's encounter via the notes activity.  2. Access this query again and click edit on the [employer].  3. After updating, or not, click F2 to  complete all highlighted (required) fields concerning your review. Select "additional documentation in the medical record" OR "no additional documentation provided".  4. Click Sign note button.  5. The deficiency will fall out of your InBasket *Please let us know if you are not able to compete this workflow by phone or e-mail (listed below).

## 2011-05-03 NOTE — Progress Notes (Signed)
[facility] Daily Rounding Note 05/03/2011, 9:52 AM  SUBJECTIVE: Dr Antoine Poche asking GI to resee this pt with heme negative, iron deficiency anemia.  Followed by Dr. Juanda Chance 11/23 - 11/24. Suffered NSTEMI, Cardiac cath on 11/26: stenosis at mid LAD and RCA both too small for PCI.  Medical management in place including 81mg  ASA. No repeat stool guaics but was heme negative twice last week, prior to cath, in setting of Integrillin and Heparin therapies. At home has infrequent scant blood per rectum when she wipes.  No melena, hematochezia.  Family reported a dark stool around 11/21.  Also has occasional solid dysphagia at least partially related to edentulous state.  No belly pain or significant reflux sxs.  We did start empiric Protonix last week Iron level less than 10.0.   Hgb fluctuates by up to 0.5 grams but essentially stable at about 8.5 for last 5 days. Has not received blood transfusion. Is receiving twice daily PO Iron. Dr. Juanda Chance had chosen not to pursue EGD, Colon until after cardiac cath and wished to delay colonoscopy until 6 weeks post MI, which represents early January. She is having daily BMs. Recalls remote EGD but no colonoscopy. She lives in [place], [place].    OBJECTIVE : Obese, moderately well appearing elderly w.f.  Comfortable, NAD  Vital signs in last 24 hours: Temp:  [97.5 F (36.4 C)-97.9 F (36.6 C)] 97.9 F (36.6 C) (11/28 0822) Pulse Rate:  [42-114] 53  (11/28 0822) Resp:  [18-20] 18  (11/28 0822) BP: (77-135)/(39-88) 113/62 mmHg (11/28 0822) SpO2:  [84 %-100 %] 84 % (11/28 0822) Weight:  [107.8 kg (237 lb 10.5 oz)] 237 lb 10.5 oz (107.8 kg) (11/28 0500) Last BM Date: 05/02/11  Heart: Irreg, PVCs on monitor.  Rate controlled. Chest: Clear, BS decreased. Abdomen: Obese, soft, NT, ND.  Active BS Extremities: Pedal edema, non-pitting Neuro/Psych:  Pleasant, cooperative, not confused   Current Meds: 05/03/11 0716   isosorbide mononitrate (IMDUR) 24 hr  tablet 30 mg 30 mg, PO, Daily   05/03/11 1000 -- 04/29/11 1004   ferrous sulfate tablet 325 mg 325 mg, PO, 2 times daily with meals   04/29/11 1630 -- 04/27/11 1546   aspirin EC tablet 81 mg 81 mg, PO, Daily   04/29/11 1000 -- 04/28/11 1531   pantoprazole (PROTONIX) EC tablet 40 mg 40 mg, PO, Daily   04/28/11 1545 -- 04/27/11 1541   diazepam (VALIUM) tablet 5 mg 5 mg, PO, On call   04/28/11 0000 04/29/11 0000 04/27/11 0810   carvedilol (COREG) tablet 6.25 mg 6.25 mg, PO, 2 times daily with meals   04/27/11 1630 -- 04/26/11 2046   glimepiride (AMARYL) tablet 4 mg 4 mg, PO, 2 times daily   04/27/11 1000 -- 04/27/11 0807   guaiFENesin (MUCINEX) 12 hr tablet 600 mg 600 mg, PO, 2 times daily   04/27/11 1000 -- 04/26/11 2047   insulin aspart (novoLOG) injection 0-15 Units 0-15 Units, [place], 3 times daily with meals   04/27/11 0800 -- 04/26/11 2046   linagliptin (TRADJENTA) tablet 5 mg 5 mg, PO, Daily   04/27/11 0030 -- 04/26/11 2046   pregabalin (LYRICA) capsule 50 mg 50 mg, PO, Daily at bedtime   04/26/11 2200 -- 04/26/11 2012  insulin aspart (novoLOG) injection 0-5 Units 0-5 Units, [place], Daily at bedtime   04/26/11 2200 -- 04/26/11 2046   lisinopril (PRINIVIL,ZESTRIL) tablet 5 mg 5 mg, PO, Daily   04/26/11 2100 -- 04/26/11 2049  Basename 05/03/11 0435 05/02/11 0515 05/01/11 0716  WBC 7.0 6.1 4.7  HGB 8.3* 8.8* 8.2*  HCT 26.1* 27.1* 25.1*  PLT 185 182 160   BMET  Basename 05/03/11 0435 05/02/11 0515 05/01/11 0716  NA 133* 138 136  K 4.8 4.7 4.6  CL 103 107 105  CO2 23 23 25   GLUCOSE 138* 101* 104*  BUN 30* 30* 34*  CREATININE 1.15* 1.10 1.19*  CALCIUM 8.8 8.7 8.6   TSH:  0.892   04/27/2011  Studies/Results: Dg Chest 2 View  05/03/2011  *RADIOLOGY REPORT*  Clinical Data: CHF and dyspnea  CHEST - 2 VIEW  Comparison: Chest radiograph 04/27/2011, 04/26/2011 and 06/19/2009  Findings: Stable cardiomegaly.  Mild central pulmonary vascular congestion is present.  No definite  pulmonary edema.  Negative for pleural effusion or focal airspace disease.  No acute bony abnormality.  IMPRESSION: Mild cardiomegaly and pulmonary vascular congestion.  Original Report Authenticated By: Britta Mccreedy, M.D.    ASSESMENT: 1.  Iron deficiency anemia.  Thus far FOB- stools.  Does have hx intermittent scant BPR.  Will need future colonoscopy.  In addition has solid dysphagia which may be consequence of dysfunctional mastication as she has no dentures, but can not rule out dysmotility or esophageal stricture. Has begun PO Iron therapy and empiric Protonix. 2.  NSTEMI.  2 vessel stenosis on cardiac cath, not amenable to PCI/stent.  Medical mgt in place. Not having CP. 3.  NIDDM. 4.  Mild renal insufficiency. 5.  Mild hyponatremia. 6.  Cardiomegaly, mild pulmonary congestion.  Do not see an Echo report nor EF score in cath op note. PLAN: 1.  Per Dr Marina Goodell.   LOS: 7 days   Jennye Moccasin  05/03/2011, 9:52 AM Pager: [phone]    GI ATTENDING RE-CONSULT FOR CHRONIC NORMOCYTIC ANEMIA WITH NORMAL FERRITIN AND LOW IRON (?FE DEFICIENCY). STOOLS HEME NEGATIVE. Review of pertinent gastrointestinal problems IS NEGATIVE EXCEPT FOR MILD DYSPHAGIA. SIGNIFICANT CARDIAC HX AS WELL DOCUMENTED. ON PPI, IRON, AND ASA. AGREE WITH ASSESSMENT AND PLAN, PER DR BRODIE, FOR OUT PATIENT GI EVALUATIONS. WE WILL ARRANGE HER APPOINTMENT. DISCUSSED WITH PATIENT. Roxy Cedar, MD

## 2011-05-04 ENCOUNTER — Other Ambulatory Visit: Payer: Self-pay

## 2011-05-04 DIAGNOSIS — I517 Cardiomegaly: Secondary | ICD-10-CM

## 2011-05-04 LAB — GLUCOSE, CAPILLARY
Glucose-Capillary: 132 mg/dL — ABNORMAL HIGH (ref 70–99)
Glucose-Capillary: 156 mg/dL — ABNORMAL HIGH (ref 70–99)
Glucose-Capillary: 128 mg/dL — ABNORMAL HIGH (ref 70–99)

## 2011-05-04 MED ORDER — FUROSEMIDE 10 MG/ML IJ SOLN
40.0000 mg | Freq: Once | INTRAMUSCULAR | Status: AC
  Administered 2011-05-04: 40 mg via INTRAVENOUS
  Filled 2011-05-04: qty 4

## 2011-05-04 NOTE — Progress Notes (Signed)
SUBJECTIVE: Back pain is better. No chest pain. Still has mild cough.   BP 138/55  Pulse 57  Temp(Src) 97.3 F (36.3 C) (Oral)  Resp 19  Ht 5\' 2"  (1.575 m)  Wt 233 lb 14.5 oz (106.1 kg)  BMI 42.78 kg/m2  SpO2 93%  Intake/Output Summary (Last 24 hours) at 05/04/11 0725 Last data filed at 05/04/11 0600  Gross per 24 hour  Intake    240 ml  Output   2751 ml  Net  -2511 ml    PHYSICAL EXAM General: Well developed, well nourished, in no acute distress. Alert and oriented x 3.  Psych:  Good affect, responds appropriately Neck: No JVD. No masses noted.  Lungs: Mild crackles. No wheezes or rhonci noted.  Heart: RRR with no murmurs noted. Abdomen: Bowel sounds are present. Soft, non-tender.  Extremities: No lower extremity edema.   LABS: Basic Metabolic Panel:  Basename 05/03/11 0435 05/02/11 0515  NA 133* 138  K 4.8 4.7  CL 103 107  CO2 23 23  GLUCOSE 138* 101*  BUN 30* 30*  CREATININE 1.15* 1.10  CALCIUM 8.8 8.7  MG -- --  PHOS -- --   CBC:  Basename 05/03/11 0435 05/02/11 0515  WBC 7.0 6.1  NEUTROABS -- --  HGB 8.3* 8.8*  HCT 26.1* 27.1*  MCV 87.0 87.4  PLT 185 182   Current Meds:    . aspirin EC  81 mg Oral Daily  . carvedilol  6.25 mg Oral BID WC  . ferrous sulfate  325 mg Oral BID WC  . glimepiride  4 mg Oral BID  . guaiFENesin  600 mg Oral BID  . insulin aspart  0-15 Units Subcutaneous TID WC  . insulin aspart  0-5 Units Subcutaneous QHS  . isosorbide mononitrate  30 mg Oral Daily  . linagliptin  5 mg Oral Daily  . lisinopril  5 mg Oral Daily  . pantoprazole  40 mg Oral Q0600  . pregabalin  50 mg Oral QHS     ASSESSMENT AND PLAN:  1) NSTEMI (non-ST elevated myocardial infarction):  Mildly elevated Troponin. 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 No good options for PCI.  Medical management. She is now on Imdur. Continue ASA, beta blocker, Ace-inhibitor. No chest pain.   2) Diabetes mellitus: Glucose has been  somewhat labile but A1c was reasonable on admission. This will be followed by her primary doctor.   3) Anemia: Iron deficiency She has been seen earlier in the hospitalization by GI and was seen again yesterday by GI. Plans for outpatient GI workup only. CBC today is pending.    4) Dyslipidemia: Continue current therapy.   5) Cough: Chest x-ray yesterday with mild vascular congestion. Will give one dose IV Lasix today. Will get echo today to assess LV function. ? If this could be an Ace-inhibitor related cough. If no resolution with mild diuresis, will consider stopping Lisinopril.    MCALHANY,CHRISTOPHER  11/29/20127:25 AM

## 2011-05-04 NOTE — Progress Notes (Signed)
CARDIAC REHAB PHASE I   PRE:  Rate/Rhythm: 54 sb with pac  BP:  Supine:   Sitting: 128/57  Standing:    SaO2:  99% RA   MODE:  Ambulation: 200 ft   POST:  Rate/Rhythem: 57  BP:  Supine:   Sitting: 149/59  Standing:    SaO2: 98% RA  0953 - 1025 Pt ambulated with walker fairly steady. Stopped x 2 to rest. Tolerated well. Back to chair with feet up and call bell in reach.  Rosalie Doctor

## 2011-05-04 NOTE — Progress Notes (Signed)
Physical Therapy Treatment Patient Details Name: [name] MRN: [id number] DOB: Oct 19, 1940 Today's Date: 05/04/2011  PT Assessment/Plan  PT - Assessment/Plan Comments on Treatment Session: Pt doing much better with mobility today.  Pt feels she can return home but sister present and states pt has fallen multiple times at home.  I suggested looking in to [facility] when pt returns home. PT Plan: Discharge plan remains appropriate Follow Up Recommendations: Skilled nursing facility Equipment Recommended: Defer to next venue PT Goals  Acute Rehab PT Goals PT Transfer Goal: Sit to Stand/Stand to Sit - Progress: Met PT Transfer Goal: Bed to Chair/Chair to Bed - Progress: Met Pt will Ambulate: with modified independence;with least restrictive assistive device;51 - 150 feet PT Goal: Ambulate - Progress: Revised (modified due to lack of progress/goal met)  PT Treatment Precautions/Restrictions  Precautions Precautions: Fall Restrictions Weight Bearing Restrictions: No Mobility (including Balance) Bed Mobility Bed Mobility: Yes Sit to Supine - Right: 7: Independent Transfers Sit to Stand: 6: Modified independent (Device/Increase time);With upper extremity assist;With armrests;From chair/3-in-1 Sit to Stand Details (indicate cue type and reason): No cues needed. Stand to Sit: 6: Modified independent (Device/Increase time);With upper extremity assist;With armrests;To chair/3-in-1;To bed Stand to Sit Details: No cues needed. Ambulation/Gait Ambulation/Gait Assistance: 5: Supervision Ambulation Distance (Feet): 150 Feet Assistive device: Rolling walker Gait Pattern: Within Functional Limits  Static Standing Balance Static Standing - Level of Assistance: 7: Independent Exercise    End of Session PT - End of Session Activity Tolerance: Patient limited by fatigue (pt had already amb twice) Patient left: in bed;with call bell in reach;with family/visitor present Nurse Communication:  Mobility status for ambulation General Behavior During Session: [facility] for tasks performed Cognition: [facility] for tasks performed  [facility] 05/04/2011, 2:12 PM [employer] PT [phone]

## 2011-05-04 NOTE — Progress Notes (Signed)
*  PRELIMINARY RESULTS* Echocardiogram 2D Echocardiogram has been performed.  Clide Deutscher RDCS 05/04/2011, 2:51 PM

## 2011-05-04 NOTE — Progress Notes (Signed)
Clinical Social Worker updated patient with the recent bed offers within [facility]. Patient prefers [facility] to receive short term rehab. CSW will follow up with the SNF.   Rozetta Nunnery MSW, [employer] [phone]

## 2011-05-05 LAB — CBC
HCT: 26.7 % — ABNORMAL LOW (ref 36.0–46.0)
Hemoglobin: 8.6 g/dL — ABNORMAL LOW (ref 12.0–15.0)
MCH: 28.3 pg (ref 26.0–34.0)
MCHC: 32.2 g/dL (ref 30.0–36.0)

## 2011-05-05 LAB — BASIC METABOLIC PANEL
BUN: 27 mg/dL — ABNORMAL HIGH (ref 6–23)
Creatinine, Ser: 1.23 mg/dL — ABNORMAL HIGH (ref 0.50–1.10)
GFR calc non Af Amer: 44 mL/min — ABNORMAL LOW (ref >90)
Glucose, Bld: 101 mg/dL — ABNORMAL HIGH (ref 70–99)
Potassium: 4.5 mEq/L (ref 3.5–5.1)

## 2011-05-05 LAB — GLUCOSE, CAPILLARY
Glucose-Capillary: 106 mg/dL — ABNORMAL HIGH (ref 70–99)
Glucose-Capillary: 173 mg/dL — ABNORMAL HIGH (ref 70–99)

## 2011-05-05 NOTE — Progress Notes (Signed)
   CARE MANAGEMENT NOTE 05/05/2011  Patient:  [name], [name]   Account Number:  [id number]  Date Initiated:  05/02/2011  Documentation initiated by:  GRAVES-BIGELOW,BRENDA  Subjective/Objective Assessment:   Pt admitted with sob and cp-nstemi. Pt post cath. She has family support of sisters and sons.     Action/Plan:   PT NOW REFUSING SNF; WANTS TO GO HOME WITH HOME HEALTH CARE.   Anticipated DC Date:  05/06/2011   Anticipated DC Plan:  HOME W HOME HEALTH SERVICES  In-house referral  Clinical Social Worker      DC Planning Services  CM consult      [facility] Choice  HOME HEALTH   Choice offered to / List presented to:  C-1 Patient   DME arranged  [facility]  WALKER - ROLLING  BEDSIDE COMMODE      DME agency  [facility]     HH arranged  HH-1 RN  HH-2 PT  HH-3 OT  HH-4 NURSE'S AIDE      HH agency  [facility]   Status of service:  Completed, signed off Medicare Important Message given?   (If response is "NO", the following Medicare IM given date fields will be blank) Date Medicare IM given:   Date Additional Medicare IM given:    Discharge Disposition:  HOME W HOME HEALTH SERVICES  Per UR Regulation:    Comments:  05/05/11 Custer Pimenta,RN,BSN 1120 PT NOW REFUSING SNF PLACEMENT; WANTS TO GO HOME WITH HOME HEALTH CARE.  PTA, PT LIVES WITH HER 2 SONS, WHO WORK DURING THE DAY,  BUT ARE THERE AT NIGHT.  SHE STATES SHE WILL HAVE THE HELP OF A FRIEND'S DAUGHTER, WHO WILL STAY WITH HER SOME DURING THE DAY.  WILL [facility] SERVICES, AND DME AS NEEDED.  REFERRAL TO [facility] WITH [facility] FOR HH/DME FOLLOW UP.  LIKELY DC IN AM.  START OF CARE 24-48H POST DC DATE. Phone #[phone]  05-02-11 [address] [place] [phone] CM will continue to f/u for additional d/c needs. CM did place a referral with leslie Judkins CSW. CM spoke to Pt and sister and they are both agreeable to facility. Trying to decide ifpt will go to  [place]  or [place] area.

## 2011-05-05 NOTE — Progress Notes (Signed)
SUBJECTIVE: cough better. No chest pain. No SOB. Feeling stronger.   BP 166/60  Pulse 54  Temp(Src) 98.7 F (37.1 C) (Oral)  Resp 20  Ht 5\' 2"  (1.575 m)  Wt 234 lb 9.1 oz (106.4 kg)  BMI 42.90 kg/m2  SpO2 98%  Intake/Output Summary (Last 24 hours) at 05/05/11 0720 Last data filed at 05/04/11 1200  Gross per 24 hour  Intake    960 ml  Output   2152 ml  Net  -1192 ml    PHYSICAL EXAM General: Well developed, well nourished, in no acute distress. Alert and oriented x 3.  Psych:  Good affect, responds appropriately Neck: No JVD. No masses noted.  Lungs: Clear bilaterally with no wheezes or rhonci noted.  Heart: RRR with no murmurs noted. Abdomen: Bowel sounds are present. Soft, non-tender.  Extremities: No lower extremity edema.   LABS: Basic Metabolic Panel:  Basename 05/05/11 0520 05/03/11 0435  NA 140 133*  K 4.5 4.8  CL 105 103  CO2 23 23  GLUCOSE 101* 138*  BUN 27* 30*  CREATININE 1.23* 1.15*  CALCIUM 8.8 8.8  MG -- --  PHOS -- --   CBC:  Basename 05/05/11 0520 05/03/11 0435  WBC 6.4 7.0  NEUTROABS -- --  HGB 8.6* 8.3*  HCT 26.7* 26.1*  MCV 87.8 87.0  PLT 222 185    Current Meds:    . aspirin EC  81 mg Oral Daily  . carvedilol  6.25 mg Oral BID WC  . ferrous sulfate  325 mg Oral BID WC  . furosemide  40 mg Intravenous Once  . glimepiride  4 mg Oral BID  . guaiFENesin  600 mg Oral BID  . insulin aspart  0-15 Units Subcutaneous TID WC  . insulin aspart  0-5 Units Subcutaneous QHS  . isosorbide mononitrate  30 mg Oral Daily  . linagliptin  5 mg Oral Daily  . lisinopril  5 mg Oral Daily  . pantoprazole  40 mg Oral Q0600  . pregabalin  50 mg Oral QHS    Echo: 05/04/11:  Left ventricle: The cavity size was normal. Wall thickness was increased in a pattern of mild LVH. Systolic function was normal. The estimated ejection fraction was in the range of 60% to 65%. Wall motion was normal; there were no regional wall motion abnormalities. Features  are consistent with a pseudonormal left ventricular filling pattern, with concomitant abnormal relaxation and increased filling pressure (grade 2 diastolic dysfunction). E/medial e' > 15 suggests LV end diastolic pressure at least 20 mmHg. - Aortic valve: There was no stenosis. - Mitral valve: Moderately calcified annulus. - Left atrium: The atrium was mildly dilated. - Right ventricle: The cavity size was normal. Systolic function was normal. - Tricuspid valve: Peak RV-RA gradient: 23mm Hg (S). - Pulmonary arteries: PA systoilc pressure 34-38 mmHg. - Impressions: IVC measured 2.4 cm with < 50% respirophasic variation, suggesting RA pressure 11-15 mmHg. Impressions:  - Normal LV size with mild LV hypertrophy. EF 60-65%. Moderate diastolic dysfunction with evidence for elevated LV filling pressure. Normal RV size and systolic function. Dilated IVC suggestive of elevated RV filling pressure. IVC measured 2.4 cm with < 50% respirophasic variation, suggesting RA pressure 11-15 mmHg.    ASSESSMENT AND PLAN:  1) NSTEMI (non-ST elevated myocardial infarction): Mildly elevated Troponin. 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 No good options for PCI. Medical management. She is now on Imdur. Continue ASA, beta blocker, Ace-inhibitor. No chest pain  over last three days.   2) Diabetes mellitus: Glucose has been somewhat labile but A1c was reasonable on admission. This will be followed by her primary doctor.   3) Anemia: Iron deficiency She has been seen earlier in the hospitalization by GI and was seen again  by GI on 05/03/11. Plans for outpatient GI workup only. H/H stable.   4) Dyslipidemia: Continue current therapy.   5) Cough: Much better. Chest x-ray mild vascular congestion. One dose IV Lasix yesterday. Good diuresis. Echo with normal LV function as above.    6) Disposition: The patient is nearly ready for d/c. Still with some weakness.She does not  want placement for rehab. I spoke to her daughter on the phone this am. 24 more hours hospitalization with walking. If ok in am, d/c home. Follow up with Dr. Antoine Poche in 2 weeks.       [name]  11/30/20127:20 AM

## 2011-05-05 NOTE — Progress Notes (Signed)
Clinical Social Worker reported that she does not want to continue with SNF option for rehab and would like to go home to continue therapy. CSW transferred discharge planning to CM, who will set patient up with Home Health Services for continuation of care. CSW will sign off.   Rozetta Nunnery MSW, [employer] [phone]

## 2011-05-05 NOTE — Progress Notes (Signed)
Utilization review completed. Orvile Corona, RN, BSN. 05/05/11  

## 2011-05-06 ENCOUNTER — Other Ambulatory Visit: Payer: Self-pay

## 2011-05-06 ENCOUNTER — Encounter (HOSPITAL_COMMUNITY): Payer: Self-pay | Admitting: Physician Assistant

## 2011-05-06 DIAGNOSIS — I2510 Atherosclerotic heart disease of native coronary artery without angina pectoris: Secondary | ICD-10-CM

## 2011-05-06 DIAGNOSIS — N289 Disorder of kidney and ureter, unspecified: Secondary | ICD-10-CM

## 2011-05-06 HISTORY — DX: Disorder of kidney and ureter, unspecified: N28.9

## 2011-05-06 LAB — CBC
HCT: 28.5 % — ABNORMAL LOW (ref 36.0–46.0)
MCHC: 31.9 g/dL (ref 30.0–36.0)
MCV: 88.8 fL (ref 78.0–100.0)
RDW: 14.7 % (ref 11.5–15.5)

## 2011-05-06 LAB — GLUCOSE, CAPILLARY: Glucose-Capillary: 182 mg/dL — ABNORMAL HIGH (ref 70–99)

## 2011-05-06 LAB — BASIC METABOLIC PANEL
BUN: 35 mg/dL — ABNORMAL HIGH (ref 6–23)
CO2: 25 mEq/L (ref 19–32)
Chloride: 104 mEq/L (ref 96–112)
Creatinine, Ser: 1.31 mg/dL — ABNORMAL HIGH (ref 0.50–1.10)

## 2011-05-06 MED ORDER — NITROGLYCERIN 0.4 MG SL SUBL: 0.4000 mg | SUBLINGUAL_TABLET | SUBLINGUAL | Status: DC | PRN

## 2011-05-06 MED ORDER — FERROUS SULFATE 325 (65 FE) MG PO TABS: 325.0000 mg | ORAL_TABLET | Freq: Two times a day (BID) | ORAL | Status: DC

## 2011-05-06 MED ORDER — ISOSORBIDE MONONITRATE ER 30 MG PO TB24: 30.0000 mg | ORAL_TABLET | Freq: Every day | ORAL | Status: DC

## 2011-05-06 MED ORDER — PANTOPRAZOLE SODIUM 40 MG PO TBEC: 40.0000 mg | DELAYED_RELEASE_TABLET | Freq: Every day | ORAL | Status: DC

## 2011-05-06 MED ORDER — CARVEDILOL 6.25 MG PO TABS: 6.2500 mg | ORAL_TABLET | Freq: Two times a day (BID) | ORAL | Status: DC

## 2011-05-06 NOTE — Progress Notes (Signed)
Pt given discharge instructions, follow-up appts and scripts.  Pt and son verbalizes understanding of meds and care.  Pt verbalizes understanding of s/sx of infection and knows when to call 911.  Thomas Hoff

## 2011-05-06 NOTE — Discharge Planning (Signed)
Discharge Summary   Patient ID: [name],  MRN: [id number], DOB/AGE: [birth year] 70 y.o.  Admit date: 04/26/2011 Discharge date: 05/06/2011  Discharge Diagnoses Principal Problem:  *NSTEMI (non-ST elevated myocardial infarction) Active Problems:  Diabetes mellitus  Anemia  Dyslipidemia  Nonproductive cough  Benign hypertensive heart disease without heart failure   Allergies Allergies  Allergen Reactions  . Codeine Other (See Comments)    Childhood allergy    Procedures  2D echocardiogram without contrast  Study Conclusions  - Left ventricle: The cavity size was normal. Wall thickness was increased in a pattern of mild LVH. Systolic function was normal. The estimated ejection fraction was in the range of 60% to 65%. Wall motion was normal; there were no regional wall motion abnormalities. Features are consistent with a pseudonormal left ventricular filling pattern, with concomitant abnormal relaxation and increased filling pressure (grade 2 diastolic dysfunction). E/medial e' > 15 suggests LV end diastolic pressure at least 20 mmHg. - Aortic valve: There was no stenosis. - Mitral valve: Moderately calcified annulus. - Left atrium: The atrium was mildly dilated. - Right ventricle: The cavity size was normal. Systolic function was normal. - Tricuspid valve: Peak RV-RA gradient: 23mm Hg (S). - Pulmonary arteries: PA systoilc pressure 34-38 mmHg. - Impressions: IVC measured 2.4 cm with < 50% respirophasic variation, suggesting RA pressure 11-15 mmHg.  Impressions:  - Normal LV size with mild LV hypertrophy. EF 60-65%. Moderate diastolic dysfunction with evidence for elevated LV filling pressure. Normal RV size and systolic function. Dilated IVC suggestive of elevated RV filling pressure. IVC measured 2.4 cm with < 50% respirophasic variation, suggesting RA pressure 11-15 mmHg.  Left heart catheterization, selective coronary angiography Coronary Interventions: None    Hemodynamic Findings:  Central aortic pressure:183/52  Left ventricular pressure: 183/11/19  Angiographic Findings:  Left main: Short segment, no disease.  Left Anterior Descending Artery: Diffuse calcification proximally with mild obstructive disease. The vessel quickly tapers to a small caliber vessel in the mid segment beyond the small diagonal branch. There is a 70% stenosis in the mid vessel just beyond the diagonal branch. The vessel is approximately 1.75 mm in the mid segment and tapers even further in the distal vessel.  Circumflex Artery: Large dominant vessel with 30% proximal stenosis. Large trifurcating marginal with mild plaque disease.  Right Coronary Artery: Small, non-dominant vessel with diffuse 95% stenosis in the mid segment. (1.25 mm vessel).  Left Ventricular Angiogram: Not performed.  Impression:  1. Moderately severe stenosis in the small caliber mid LAD, too small for PCI.  2. Severe stenosis in small caliber non-dominant RCA, likely culprit. Too small for PCI.  3. Mild plaque Circumflex.   History of Present Illness  Ms. Lamke is a 70yo Caucasian female with PMHx significant for poorly controlled type 2 DM, obestiy, HL, HTN and anemia admitted to [facility] on 11/21 with NSTEMI.  Hospital Course   Upon presentation to the ED, initial POC TnI was mildly elevated. CXR revealed cardiomegaly and mild pulmonary vascular congestion complaining of a nonproductive cough. She was admitted with a diagnosis of NSTEMI, started on integrilin, heparin and IV NTG and scheduled for cardiac cath 11/23. She was also given Mucinex and azithromycin for cough and suspected bronchitis on CXR.    She improved symptomatically; however, Hgb decreased and Cr increased with family stating she had dark stools in the ED. Cath was deferred, hemoccult ordered was neg., T&S ordered, heparin and integrilin discontinued, and GI consult diagnosed her with iron deficiency  anemia. Renal function was  reassessed and because of wheezing and rales on exam, given saline lock with low-dose Lasix. This improved her breathing. GI deferred plans for EGD, recommended colonoscopy 6 weeks post-cath at the earliest and cleared for cath.  Lasix was held and BMET ordered just prior to the procedure was stable. She agreed and consented. Catheterization done by Dr. Clifton James revealed moderately severe stenosis mid-LAD and severe stenosis in RCA, both too small for PCI, in addition to mild plaque in LCx.  A recommendation of medical management was made based on the lesions being too small for PCI (consistent with diabetic vasculopathy) and optimal treatment with DES not supported due to her history of anemia. She tolerated the procedure well.   She recovered well during the remainder of the hospital stay. Her symptoms improved and she underwent cardiac rehab. Developed a mild hyponatremia which resolved with free water restriction. IV NTG was d/ced due to hypotension, replaced with Imdur. Gastroenterology consult post-cath recommended Iron PO and Protonix with aforementioned recommendation for colonoscopy.   She is currently stable, in good condition and ready for discharge. OT/PT recommendation for SNF at discharge was made. Pt expressed preference of discharge to home with home health. This was made by case management.   Discharge Vitals:  Blood pressure 142/54, pulse 52, temperature 97.7 F (36.5 C), temperature source Oral, resp. rate 20, height 5\' 2"  (1.575 m), weight 107.276 kg (236 lb 8 oz), SpO2 95.00%.   Weight change: 0.876 kg (1 lb 14.9 oz) I/O: -11.299.7  Labs:   Lab Results  Component Value Date   WBC 7.2 05/06/2011   HGB 9.1* 05/06/2011   HCT 28.5* 05/06/2011   MCV 88.8 05/06/2011   PLT 242 05/06/2011    Lab 05/06/11 0500  NA 137  K 5.2*  CL 104  CO2 25  BUN 35*  CREATININE 1.31*  CALCIUM 8.9  PROT --  BILITOT --  ALKPHOS --  ALT --  AST --  GLUCOSE 112*    Results for Brenda, Weeks (MRN [id number]) as of 05/06/2011 11:16  Ref. Range 04/26/2011 20:45 04/27/2011 01:49 04/27/2011 08:42 04/27/2011 12:48 04/28/2011 11:49  CK-BB Latest Range: 0 % 0.0      CK-MB Latest Range: <5 % 0.0      CK, MB Latest Range: 0.3-4.0 ng/mL  7.9 (HH) QUESTIONABLE IDENTIFICATION / INCORRECTLY LABELED SPECIMEN 3.7 4.7 (H)  CK-MM Latest Range: 95-100 % 100.0      CK Total Latest Range: 7-177 U/L 745 (H) 1284 (H) QUESTIONABLE IDENTIFICATION / INCORRECTLY LABELED SPECIMEN 1458 (H) 1425 (H)  Troponin I Latest Range: <0.30 ng/mL 2.11 (HH) 2.99 (HH) QUESTIONABLE IDENTIFICATION / INCORRECTLY LABELED SPECIMEN 1.70 (HH) 1.01 (HH)    Lab Results  Component Value Date   CHOL 113 04/27/2011   Lab Results  Component Value Date   HDL 62 04/27/2011   Lab Results  Component Value Date   LDLCALC 38 04/27/2011   Lab Results  Component Value Date   TRIG 64 04/27/2011   Lab Results  Component Value Date   CHOLHDL 1.8 04/27/2011    Disposition:  Discharge Orders    Future Appointments: Provider: Department: Dept Phone: Center:   05/31/2011 11:00 AM Hart Carwin, MD [facility] Office [phone] [facility]     Follow-up Information    Follow up with [name], MD on 05/31/2011. (11 AM)    Contact information:   [address] [place] [address] [place]  [address] [phone]       Follow up with [facility]. (Will call Ms. Sidell with appointment date/time. To see Dr. Antoine Poche for this hospitalization. )    Contact information:   [address] [place] [address]          Discharge Medications:  Current Discharge Medication List    START taking these medications   Details  carvedilol (COREG) 6.25 MG tablet Take 1 tablet (6.25 mg total) by mouth 2 (two) times daily with a meal. Qty: 60 tablet, Refills: 3    ferrous sulfate 325 (65 FE) MG tablet Take 1 tablet (325 mg total) by mouth 2 (two) times daily with a meal. Qty: 60  tablet, Refills: 3   Comments: Iron can be constipating. Please take a daily stool softener with this.    isosorbide mononitrate (IMDUR) 30 MG 24 hr tablet Take 1 tablet (30 mg total) by mouth daily. Qty: 30 tablet, Refills: 3    nitroGLYCERIN (NITROSTAT) 0.4 MG SL tablet Place 1 tablet (0.4 mg total) under the tongue every 5 (five) minutes as needed for chest pain (Take as needed for chest pain not relieved on regularly scheduled Imdur. ). Qty: 25 tablet, Refills: 3   Comments: Take 1 tablet, wait 5 minutes, if pain unrelieved, take 1 more tablet, if pain persists, call 911.    pantoprazole (PROTONIX) 40 MG tablet Take 1 tablet (40 mg total) by mouth daily at 6 (six) AM. Qty: 30 tablet, Refills: 3      CONTINUE these medications which have NOT CHANGED   Details  aspirin EC 81 MG tablet Take 81 mg by mouth daily.     Dextromethorphan-Guaifenesin (DIABETIC TUSSIN MAX ST) 10-200 MG/5ML LIQD Take 5 mLs by mouth every 2 (two) hours as needed. For cough     furosemide (LASIX) 20 MG tablet Take 20 mg by mouth daily.      glimepiride (AMARYL) 4 MG tablet Take 4 mg by mouth 2 (two) times daily.     lisinopril (PRINIVIL,ZESTRIL) 5 MG tablet Take 5 mg by mouth daily.      metFORMIN (GLUCOPHAGE) 1000 MG tablet Take 1,000 mg by mouth 2 (two) times daily with a meal.      pregabalin (LYRICA) 50 MG capsule Take 50 mg by mouth at bedtime.      promethazine-dextromethorphan (PROMETHAZINE-DM) 6.25-15 MG/5ML syrup Take 10 mLs by mouth 4 (four) times daily as needed. For cough     rosuvastatin (CRESTOR) 20 MG tablet Take 20 mg by mouth at bedtime.      sitaGLIPtin (JANUVIA) 100 MG tablet Take 100 mg by mouth daily.           Outstanding Labs/Studies: Potassium elevated at 5.2 today, upon calling the lab, they report mild hemolysis to the sample. Pt's BUN/Cr mildly elevated today. Will schedule BMET soon after discharge.   Duration of Discharge Encounter: 40 minutes including physician  time.  Signed, Hurman Horn, PA-C 05/06/2011, 11:40 AM

## 2011-05-06 NOTE — Progress Notes (Signed)
Patient ID: [name], female   DOB: [birth year], 70 y.o.   MRN: [id number] SUBJECTIVE:   Patient is feeling better.  She is stable and ready to go home today.  Creatinine is slightly elevated and potassium is slightly increased.  She is not receiving any potassium.  She has been on an ACE inhibitor before.  Filed Vitals:   05/05/11 1717 05/05/11 2128 05/06/11 0215 05/06/11 0300  BP: 123/60 143/59  142/54  Pulse:  57  52  Temp:  97 F (36.1 C)  97.7 F (36.5 C)  TempSrc:  Oral  Oral  Resp:  20  20  Height:      Weight:   236 lb 8 oz (107.276 kg)   SpO2:  95%  95%    Intake/Output Summary (Last 24 hours) at 05/06/11 1158 Last data filed at 05/06/11 0900  Gross per 24 hour  Intake    700 ml  Output   1901 ml  Net  -1201 ml    LABS: Basic Metabolic Panel:  Basename 05/06/11 0500 05/05/11 0520  NA 137 140  K 5.2* 4.5  CL 104 105  CO2 25 23  GLUCOSE 112* 101*  BUN 35* 27*  CREATININE 1.31* 1.23*  CALCIUM 8.9 8.8  MG -- --  PHOS -- --   Liver Function Tests: No results found for this basename: AST:2,ALT:2,ALKPHOS:2,BILITOT:2,PROT:2,ALBUMIN:2 in the last 72 hours No results found for this basename: LIPASE:2,AMYLASE:2 in the last 72 hours CBC:  Basename 05/06/11 0500 05/05/11 0520  WBC 7.2 6.4  NEUTROABS -- --  HGB 9.1* 8.6*  HCT 28.5* 26.7*  MCV 88.8 87.8  PLT 242 222   Cardiac Enzymes: No results found for this basename: CKTOTAL:3,CKMB:3,CKMBINDEX:3,TROPONINI:3 in the last 72 hours BNP: No results found for this basename: POCBNP:3 in the last 72 hours D-Dimer: No results found for this basename: DDIMER:2 in the last 72 hours Hemoglobin A1C: No results found for this basename: HGBA1C in the last 72 hours Fasting Lipid Panel: No results found for this basename: CHOL,HDL,LDLCALC,TRIG,CHOLHDL,LDLDIRECT in the last 72 hours Thyroid Function Tests: No results found for this basename: TSH,T4TOTAL,FREET3,T3FREE,THYROIDAB in the last 72 hours  RADIOLOGY: Dg Chest 2  View  05/03/2011  *RADIOLOGY REPORT*  Clinical Data: CHF and dyspnea  CHEST - 2 VIEW  Comparison: Chest radiograph 04/27/2011, 04/26/2011 and 06/19/2009  Findings: Stable cardiomegaly.  Mild central pulmonary vascular congestion is present.  No definite pulmonary edema.  Negative for pleural effusion or focal airspace disease.  No acute bony abnormality.  IMPRESSION: Mild cardiomegaly and pulmonary vascular congestion.  Original Report Authenticated By: Britta Mccreedy, M.D.   Dg Chest 2 View  04/26/2011  *RADIOLOGY REPORT*  Clinical Data: Wheezing, chest pain and shortness of breath.  CHEST - 2 VIEW  Comparison: Single view of the chest 06/19/2009.  Findings: There is cardiomegaly and vascular congestion.  No frank pulmonary edema.  No focal airspace disease or effusion.  No pneumothorax.  IMPRESSION: Cardiomegaly and pulmonary vascular congestion.  Original Report Authenticated By: Bernadene Bell. Maricela Curet, M.D.   Dg Chest Port 1 View  04/27/2011  *RADIOLOGY REPORT*  Clinical Data: Cough and dyspnea  PORTABLE CHEST - 1 VIEW  Comparison: 04/26/2011  Findings: Mild cardiomegaly.  Vascular congestion has increased. Mild interstitial edema has developed.  No pneumothorax.  IMPRESSION: Mild CHF.  Original Report Authenticated By: Donavan Burnet, M.D.    PHYSICAL EXAM    Patient is stable.  She is oriented to person time and place.  Affect  is normal.  Lungs reveal some scattered rhonchi.  Cardiac exam reveals an S1-S2.  There no clicks or significant murmurs.  TELEMETRY:        I have personally reviewed to telemetry.  There is normal sinus rhythm.  ASSESSMENT AND PLAN:  Principal Problem:  *NSTEMI (non-ST elevated myocardial infarction)   Patient is stable.  She is ready to go home. Active Problems:  Diabetes mellitus  Anemia  Dyslipidemia  Nonproductive cough  Benign hypertensive heart disease without heart failure  CAD (coronary artery disease)   Renal insufficiency     There is slight rise  in her creatinine today.  She is stable.  She will go home on her prior dose of diuretic.  She needs chemistry checked in one week.  See the note from Dr. Clifton James about other post hospital plans.  He wrote the note yesterday.   [name] 05/06/2011 11:58 AM

## 2011-05-08 ENCOUNTER — Telehealth: Payer: Self-pay | Admitting: *Deleted

## 2011-05-09 ENCOUNTER — Ambulatory Visit (INDEPENDENT_AMBULATORY_CARE_PROVIDER_SITE_OTHER): Payer: Medicare Other | Admitting: *Deleted

## 2011-05-09 DIAGNOSIS — I2510 Atherosclerotic heart disease of native coronary artery without angina pectoris: Secondary | ICD-10-CM

## 2011-05-09 LAB — BASIC METABOLIC PANEL WITH GFR
BUN: 30 mg/dL — ABNORMAL HIGH (ref 6–23)
CO2: 25 meq/L (ref 19–32)
Calcium: 8.9 mg/dL (ref 8.4–10.5)
Chloride: 109 meq/L (ref 96–112)
Creatinine, Ser: 1.3 mg/dL — ABNORMAL HIGH (ref 0.4–1.2)
GFR: 42.71 mL/min — ABNORMAL LOW
Glucose, Bld: 129 mg/dL — ABNORMAL HIGH (ref 70–99)
Potassium: 5.1 meq/L (ref 3.5–5.1)
Sodium: 141 meq/L (ref 135–145)

## 2011-05-17 ENCOUNTER — Encounter: Payer: Self-pay | Admitting: Cardiology

## 2011-05-17 ENCOUNTER — Ambulatory Visit (INDEPENDENT_AMBULATORY_CARE_PROVIDER_SITE_OTHER): Payer: Medicare Other | Admitting: Cardiology

## 2011-05-17 DIAGNOSIS — E785 Hyperlipidemia, unspecified: Secondary | ICD-10-CM

## 2011-05-17 DIAGNOSIS — I2510 Atherosclerotic heart disease of native coronary artery without angina pectoris: Secondary | ICD-10-CM

## 2011-05-17 DIAGNOSIS — N289 Disorder of kidney and ureter, unspecified: Secondary | ICD-10-CM

## 2011-05-17 DIAGNOSIS — E119 Type 2 diabetes mellitus without complications: Secondary | ICD-10-CM

## 2011-05-17 NOTE — Assessment & Plan Note (Signed)
Lab Results  Component Value Date   HGBA1C 6.8* 04/26/2011   This will continue to be followed by Josue Hector, MD

## 2011-05-17 NOTE — Progress Notes (Signed)
The patient presents for followup after recent hospitalization for chest pain and non-Q-wave microinfarction. She was found to have small dominant right coronary artery with diffuse 95% stenosis. The LAD had 70% stenosis in the mid vessel beyond the diagonal branch but was a small vessel. Circumflex had proximal 30% stenosis. She was managed medically. She did have some renal insufficiency that was mild and followed.  She also had back pain and anemia.  She seen by GI and follow up is scheduled with a probable colonoscopy on 05/21/11.  I reviewed this with the patient.  Since discharge she has had no new complaints. She continues to be limited by joint pains. She has intermittent back discomfort. She has no chest pressure though. She's not having any new shortness of breath, PND or orthopnea. She's had no palpitations, presyncope or syncope. She's had no dark black stools or red blood.  Allergies  Allergen Reactions  . Codeine Other (See Comments)    Childhood allergy    Current Outpatient Prescriptions  Medication Sig Dispense Refill  . aspirin EC 81 MG tablet Take 81 mg by mouth daily.       . carvedilol (COREG) 6.25 MG tablet Take 1 tablet (6.25 mg total) by mouth 2 (two) times daily with a meal.  60 tablet  3  . Dextromethorphan-Guaifenesin (DIABETIC TUSSIN MAX ST) 10-200 MG/5ML LIQD Take 5 mLs by mouth every 2 (two) hours as needed. For cough       . ferrous sulfate 325 (65 FE) MG tablet Take 1 tablet (325 mg total) by mouth 2 (two) times daily with a meal.  60 tablet  3  . furosemide (LASIX) 20 MG tablet Take 20 mg by mouth daily.        Marland Kitchen glimepiride (AMARYL) 4 MG tablet Take 4 mg by mouth 2 (two) times daily.       . isosorbide mononitrate (IMDUR) 30 MG 24 hr tablet Take 1 tablet (30 mg total) by mouth daily.  30 tablet  3  . lisinopril (PRINIVIL,ZESTRIL) 5 MG tablet Take 5 mg by mouth daily.        . metFORMIN (GLUCOPHAGE) 1000 MG tablet Take 1,000 mg by mouth 2 (two) times daily  with a meal.        . nitroGLYCERIN (NITROSTAT) 0.4 MG SL tablet Place 1 tablet (0.4 mg total) under the tongue every 5 (five) minutes as needed for chest pain (Take as needed for chest pain not relieved on regularly scheduled Imdur. ).  25 tablet  3  . pantoprazole (PROTONIX) 40 MG tablet Take 1 tablet (40 mg total) by mouth daily at 6 (six) AM.  30 tablet  3  . pregabalin (LYRICA) 50 MG capsule Take 50 mg by mouth at bedtime.        . promethazine-dextromethorphan (PROMETHAZINE-DM) 6.25-15 MG/5ML syrup Take 10 mLs by mouth 4 (four) times daily as needed. For cough       . rosuvastatin (CRESTOR) 20 MG tablet Take 20 mg by mouth at bedtime.        . sitaGLIPtin (JANUVIA) 100 MG tablet Take 100 mg by mouth daily.          Past Medical History  Diagnosis Date  . Diabetes mellitus   . Hypertension   . Hyperlipemia   . Fibromyalgia   . Obesity   . Angina   . Sleep apnea   . Anxiety   . Hiatal hernia   . NSTEMI (non-ST elevated myocardial  infarction) 04/27/2011  . Anemia 04/27/2011  . Restless leg syndrome   . CAD (coronary artery disease)   . Renal insufficiency 05/06/2011    Past Surgical History  Procedure Date  . Appendectomy   . Fracture surgery     left humerous    ROS:  As stated in the HPI and negative for all other systems.  PHYSICAL EXAM BP 126/89  Pulse 60  Resp 16  Ht 5\' 1"  (1.549 m)  Wt 228 lb (103.42 kg)  BMI 43.08 kg/m2 GENERAL:  Chronically ill appearing HEENT:  Pupils equal round and reactive, fundi not visualized, oral mucosa unremarkable, edentulous NECK:  No jugular venous distention, waveform within normal limits, carotid upstroke brisk and symmetric, no bruits, no thyromegaly LYMPHATICS:  No cervical, inguinal adenopathy LUNGS:  Clear to auscultation bilaterally BACK:  No CVA tenderness CHEST:  Unremarkable HEART:  PMI not displaced or sustained,S1 and S2 within normal limits, no S3, no S4, no clicks, no rubs, no murmurs ABD:  Flat, positive bowel  sounds normal in frequency in pitch, no bruits, no rebound, no guarding, no midline pulsatile mass, no hepatomegaly, no splenomegaly EXT:  2 plus pulses throughout, trace edema, no cyanosis no clubbing SKIN:  No rashes no nodules NEURO:  Cranial nerves II through XII grossly intact, motor grossly intact throughout PSYCH:  Cognitively intact, oriented to person place and time  ASSESSMENT AND PLAN

## 2011-05-17 NOTE — Assessment & Plan Note (Signed)
Lab Results  Component Value Date   CHOL 113 04/27/2011   HDL 62 04/27/2011   LDLCALC 38 04/27/2011   TRIG 64 04/27/2011   CHOLHDL 1.8 04/27/2011    She will remain on the meds as listed

## 2011-05-17 NOTE — Assessment & Plan Note (Signed)
Her anatomy is as described. She will continue with risk adduction. No change in therapy is indicated.

## 2011-05-17 NOTE — Patient Instructions (Signed)
The current medical regimen is effective;  continue present plan and medications.  Follow up with Dr Hochrein in 4 months. 

## 2011-05-17 NOTE — Assessment & Plan Note (Signed)
She had followup labs in our office on the fourth. Her creatinine was stable. Her potassium was upper limits of normal. No change in therapy is indicated.

## 2011-05-23 ENCOUNTER — Encounter: Payer: Self-pay | Admitting: *Deleted

## 2011-05-26 ENCOUNTER — Ambulatory Visit (INDEPENDENT_AMBULATORY_CARE_PROVIDER_SITE_OTHER): Payer: Medicare Other | Admitting: Cardiology

## 2011-05-26 DIAGNOSIS — R0989 Other specified symptoms and signs involving the circulatory and respiratory systems: Secondary | ICD-10-CM

## 2011-05-31 ENCOUNTER — Telehealth: Payer: Self-pay | Admitting: Internal Medicine

## 2011-05-31 ENCOUNTER — Ambulatory Visit: Payer: Medicare Other | Admitting: Internal Medicine

## 2011-05-31 NOTE — Telephone Encounter (Signed)
OK, don't charge

## 2011-05-31 NOTE — Telephone Encounter (Signed)
Dr Juanda Chance- Patient called at 8:30 am stating "I had already taken a bath and everything and my sister was on her way to pick me up for my appointment but the belt came off her car on her way to get me." Her appointment was at 11 am today. Do you want to charge late cancellation fee?

## 2011-05-31 NOTE — Telephone Encounter (Signed)
No charge. 

## 2011-06-15 ENCOUNTER — Encounter: Payer: Self-pay | Admitting: *Deleted

## 2011-06-20 NOTE — Progress Notes (Signed)
  Subjective:    Patient ID: [name], female    DOB: 06/27/1940, 71 y.o.   MRN: [id number]  HPI    Review of Systems     Objective:   Physical Exam        Assessment & Plan:

## 2011-06-23 ENCOUNTER — Ambulatory Visit: Payer: Medicare Other | Admitting: Internal Medicine

## 2011-07-18 ENCOUNTER — Encounter: Payer: Self-pay | Admitting: Internal Medicine

## 2011-07-18 ENCOUNTER — Ambulatory Visit (INDEPENDENT_AMBULATORY_CARE_PROVIDER_SITE_OTHER): Payer: Medicare Other | Admitting: Internal Medicine

## 2011-07-18 VITALS — BP 120/90 | HR 84 | Ht 63.0 in | Wt 241.8 lb

## 2011-07-18 DIAGNOSIS — K222 Esophageal obstruction: Secondary | ICD-10-CM

## 2011-07-18 DIAGNOSIS — D509 Iron deficiency anemia, unspecified: Secondary | ICD-10-CM

## 2011-07-18 DIAGNOSIS — R195 Other fecal abnormalities: Secondary | ICD-10-CM

## 2011-07-18 MED ORDER — PEG-KCL-NACL-NASULF-NA ASC-C 100 G PO SOLR: 1.0000 | Freq: Once | ORAL | Status: DC

## 2011-07-18 NOTE — Progress Notes (Signed)
Addended by: Richardson Chiquito on: 07/18/2011 04:54 PM   Modules accepted: Orders

## 2011-07-18 NOTE — Progress Notes (Signed)
[name] 1941-04-10 MRN [id number]  History of Present Illness:  This is a 71 year old white female who is status post hospitalization for a non-Q wave myocardial infarction 3 months ago. She was found to be anemic with a hemoglobin of 8.2. It was 9.1 on discharge with an MCV of 88. She has renal insufficiency. Her MI has been managed medically. She canceled her appointment on December 20th 2012. She has a positive family history of colon cancer in her mother at the age of 93. We have seen her in the past for esophageal stricture requiring endoscopy and dilatation in 1984 and most recently in 1993. She is complaining of solid food dysphagia and regurgitation. She has been on Protonix 40 mg a day. She says that she had a colonoscopy in the past but our records do not indicate it.   Past Medical History  Diagnosis Date  . Diabetes mellitus   . Hypertension   . Hyperlipemia   . Fibromyalgia   . Obesity   . Angina   . Sleep apnea   . Anxiety   . Hiatal hernia   . NSTEMI (non-ST elevated myocardial infarction) 04/27/2011  . Anemia 04/27/2011  . Restless leg syndrome   . CAD (coronary artery disease)   . Renal insufficiency 05/06/2011  . External hemorrhoids    Past Surgical History  Procedure Date  . Appendectomy   . Fracture surgery     left humerous  . Ganglion cyst removal     reports that she has never smoked. She has never used smokeless tobacco. She reports that she does not drink alcohol or use illicit drugs. family history includes Heart disease in her maternal grandfather and maternal grandmother and Stomach cancer in her mother. Allergies  Allergen Reactions  . Codeine Other (See Comments)    Childhood allergy        Review of Systems: Positive. Rectal dysphagia. Positive for shortness of breath. Occasional chest pain relieved with nitroglycerin. Regular bowel habits with mild constipation  The remainder of the 10 point ROS is negative except as outlined in  H&P   Physical Exam: General appearance  Well developed, in no distress. Eyes- non icteric. HEENT nontraumatic, normocephalic. Mouth no lesions, tongue papillated, no cheilosis. Neck supple without adenopathy, thyroid not enlarged, no carotid bruits, no JVD. Lungs Clear to auscultation bilaterally. Cor normal S1, normal S2, regular rhythm, no murmur,  quiet precordium. Abdomen: Obese soft nontender with normoactive bowel sounds. No fluid wave.  Rectal: Soft trace Hemoccult-positive stool Extremities no pedal edema. Skin no lesions. Neurological alert and oriented x 3. Psychological normal mood and affect.  Assessment and Plan:  Problem #1 Anemia and heme positive stool in the setting of an esophageal stricture. We need to rule out Barrett's esophagus, colon polyps or colon cancer. She does not have any specific symptoms pertaining to her colon but she is having symptoms consistent with a recurrent esophageal stricture. We will proceed with an upper endoscopy and dilatation as well as a screening colonoscopy. Because of the family history of colon cancer in her mother at the age of 48, she is a good candidate for a screening colonoscopy. Her procedures will be scheduled with Moviprep. Her blood sugars and diabetic medications will be adjusted prior to the procedure.   07/18/2011 [name]

## 2011-07-18 NOTE — Patient Instructions (Signed)
You have been scheduled for an endoscopy and colonoscopy with propofol. Please follow the written instructions given to you at your visit today. Please pick up yourprep at the pharmacy within the next 2-3 days. CC: Dr Lysbeth Galas

## 2011-07-19 ENCOUNTER — Ambulatory Visit (AMBULATORY_SURGERY_CENTER): Payer: Medicare Other | Admitting: Internal Medicine

## 2011-07-19 ENCOUNTER — Encounter: Payer: Self-pay | Admitting: Internal Medicine

## 2011-07-19 DIAGNOSIS — I119 Hypertensive heart disease without heart failure: Secondary | ICD-10-CM

## 2011-07-19 DIAGNOSIS — D649 Anemia, unspecified: Secondary | ICD-10-CM

## 2011-07-19 DIAGNOSIS — D509 Iron deficiency anemia, unspecified: Secondary | ICD-10-CM

## 2011-07-19 DIAGNOSIS — D126 Benign neoplasm of colon, unspecified: Secondary | ICD-10-CM

## 2011-07-19 DIAGNOSIS — R195 Other fecal abnormalities: Secondary | ICD-10-CM

## 2011-07-19 DIAGNOSIS — R1319 Other dysphagia: Secondary | ICD-10-CM

## 2011-07-19 DIAGNOSIS — D129 Benign neoplasm of anus and anal canal: Secondary | ICD-10-CM

## 2011-07-19 DIAGNOSIS — D128 Benign neoplasm of rectum: Secondary | ICD-10-CM

## 2011-07-19 DIAGNOSIS — K222 Esophageal obstruction: Secondary | ICD-10-CM

## 2011-07-19 MED ORDER — SODIUM CHLORIDE 0.9 % IV SOLN: 500.0000 mL | INTRAVENOUS | Status: DC

## 2011-07-19 MED ORDER — HYDROCORTISONE ACETATE 25 MG RE SUPP: 25.0000 mg | Freq: Two times a day (BID) | RECTAL | Status: DC

## 2011-07-19 NOTE — Op Note (Signed)
[facility] [address] [place], [place]  [address]  ENDOSCOPY PROCEDURE REPORT  PATIENT:  [name], [name]  MR#:  [id number] BIRTHDATE:  1941/05/10, 70 yrs. old  GENDER:  female  ENDOSCOPIST:  Hedwig Morton. Juanda Chance, MD Referred by:  Joette Catching, M.D.  PROCEDURE DATE:  07/19/2011 PROCEDURE:  EGD with biopsy, 43239, EGD with Savory dilation over a guidewire ASA CLASS:  Class III INDICATIONS:  dysphagia hx of es. dilation in 1984 and in 1993, recurrent symptoms  MEDICATIONS:   MAC sedation, administered by CRNA, propofol (Diprivan) 100 mg TOPICAL ANESTHETIC:  none  DESCRIPTION OF PROCEDURE:   After the risks benefits and alternatives of the procedure were thoroughly explained, informed consent was obtained.  The LB [id number] [id number] endoscope was introduced through the mouth and advanced to the second portion of the duodenum, without limitations.  The instrument was slowly withdrawn as the mucosa was fully examined. <<PROCEDUREIMAGES>>  The upper, middle, and distal third of the esophagus were carefully inspected and no abnormalities were noted. The z-line was well seen at the GEJ. The endoscope was pushed into the fundus which was normal including a retroflexed view. The antrum,gastric body, first and second part of the duodenum were unremarkable. no endoscopically significant stricture or HH, With standard forceps, a biopsy was obtained and sent to pathology (see image1, image2, and image3). Savary dilation over a guidewire 17 mm dilator passed without difficulty small bowl biopsy to r/o sprue    Retroflexed views revealed no abnormalities.    The scope was then withdrawn from the patient and the procedure completed.  COMPLICATIONS:  None  ENDOSCOPIC IMPRESSION: 1) Normal EGD s/a small bowl biopsy and passage of 17 mm dilator, no apparent stricture RECOMMENDATIONS: 1) Anti-reflux regimen to be follow colonoscopy, nothing to explain Hgb 8.2  REPEAT EXAM:  In 0  year(s) for.  ______________________________ Hedwig Morton. Juanda Chance, MD  CC:  n. eSIGNED:   Hedwig Morton. Mattisyn Cardona at 07/19/2011 01:56 PM  Adrienne Mocha, [phone]

## 2011-07-19 NOTE — Op Note (Signed)
[facility] [address] [place], [place]  [address]  COLONOSCOPY PROCEDURE REPORT  PATIENT:  Brenda, Weeks  MR#:  [id number] BIRTHDATE:  1940-10-24, 70 yrs. old  GENDER:  female ENDOSCOPIST:  Hedwig Morton. Juanda Chance, MD REF. BY:  Joette Catching, M.D. PROCEDURE DATE:  07/19/2011 PROCEDURE:  Colonoscopy with biopsy ASA CLASS:  Class III INDICATIONS:  anemia. Hgb 8.2, heme positive stool, negative EGD, no anticoagulants MEDICATIONS:   MAC sedation, administered by CRNA, propofol (Diprivan) 30 mg  DESCRIPTION OF PROCEDURE:   After the risks and benefits and of the procedure were explained, informed consent was obtained. Digital rectal exam was performed and revealed no rectal masses. The LB 180AL [id number] endoscope was introduced through the anus and advanced to the cecum, which was identified by both the appendix and ileocecal valve.  The quality of the prep was good, using MoviPrep.  The instrument was then slowly withdrawn as the colon was fully examined. <<PROCEDUREIMAGES>>  FINDINGS:  Scattered diverticula were found (see image4 and image3).  A diminutive polyp was found. 1-2 mm polyp at 80 cm The polyp was removed using cold biopsy forceps (see image2).  This was otherwise a normal examination of the colon (see image8, image7, image1, and image2).  Internal Hemorrhoids were found (see image9 and image10).   Retroflexed views in the rectum revealed no abnormalities.    The scope was then withdrawn from the patient and the procedure completed.  COMPLICATIONS:  None ENDOSCOPIC IMPRESSION: 1) Diverticula, scattered 2) Diminutive polyp 3) Otherwise normal examination 4) Internal hemorrhoids first degree hemorrhoids, likely source of heme positive stool RECOMMENDATIONS: anusol HC supp, 1 hs,#12  REPEAT EXAM:  In 10 year(s) for.  ______________________________ Hedwig Morton. Juanda Chance, MD  CC:  n. eSIGNED:   Hedwig Morton. Jalesa Thien at 07/19/2011 02:02 PM  Adrienne Mocha, [phone]

## 2011-07-19 NOTE — Progress Notes (Signed)
Patient did not experience any of the following events: a burn prior to discharge; a fall within the facility; wrong site/side/patient/procedure/implant event; or a hospital transfer or hospital admission upon discharge from the facility. (G8907) Patient did not have preoperative order for IV antibiotic SSI prophylaxis. (G8918)  

## 2011-07-19 NOTE — Patient Instructions (Signed)
Please review discharge instructions (blue and green sheets)  FOLLOW DILATION DIET TODAY!!!  Resume your normal medications  Await pathology

## 2011-07-20 ENCOUNTER — Telehealth: Payer: Self-pay

## 2011-07-20 NOTE — Telephone Encounter (Signed)
  Follow up Call-  Call back number 07/19/2011  Post procedure Call Back phone  # [phone]  Permission to leave phone message Yes     Patient questions:  Do you have a fever, pain , or abdominal swelling? no Pain Score  0 *  Have you tolerated food without any problems? yes  Have you been able to return to your normal activities? yes  Do you have any questions about your discharge instructions: Diet   no Medications  no Follow up visit  no  Do you have questions or concerns about your Care? no  Actions: * If pain score is 4 or above: No action needed, pain <4.  Per the pt she did fine. Maw

## 2011-07-24 ENCOUNTER — Encounter: Payer: Self-pay | Admitting: Internal Medicine

## 2011-08-30 ENCOUNTER — Ambulatory Visit: Payer: Medicare Other | Admitting: Cardiology

## 2011-09-20 ENCOUNTER — Other Ambulatory Visit: Payer: Self-pay | Admitting: Physician Assistant

## 2011-10-11 ENCOUNTER — Encounter: Payer: Self-pay | Admitting: Cardiology

## 2011-10-11 ENCOUNTER — Ambulatory Visit (INDEPENDENT_AMBULATORY_CARE_PROVIDER_SITE_OTHER): Payer: Medicare Other | Admitting: Cardiology

## 2011-10-11 VITALS — BP 160/80 | HR 60 | Ht 62.0 in | Wt 241.0 lb

## 2011-10-11 DIAGNOSIS — R002 Palpitations: Secondary | ICD-10-CM

## 2011-10-11 DIAGNOSIS — I2510 Atherosclerotic heart disease of native coronary artery without angina pectoris: Secondary | ICD-10-CM

## 2011-10-11 DIAGNOSIS — I499 Cardiac arrhythmia, unspecified: Secondary | ICD-10-CM

## 2011-10-11 HISTORY — DX: Cardiac arrhythmia, unspecified: I49.9

## 2011-10-11 NOTE — Progress Notes (Signed)
The patient presents for followup if CAD  Since I last saw her she has had no new complaints. She continues to be limited by joint pains.  She broke a toe and had a fall injuring her left arm.   She has intermittent shooting chest discomfort. She has no chest pressure though. She's not having any new shortness of breath, PND or orthopnea. She's had no presyncope or syncope. She does feel occasional palpitations.  Allergies  Allergen Reactions  . Codeine Other (See Comments)    Childhood allergy    Current Outpatient Prescriptions  Medication Sig Dispense Refill  . aspirin EC 81 MG tablet Take 81 mg by mouth daily.       . carvedilol (COREG) 6.25 MG tablet TAKE ONE TABLET BY MOUTH TWICE DAILY WITH  A  MEAL  60 tablet  4  . docusate sodium (COLACE) 100 MG capsule Take 100 mg by mouth daily.      . ferrous sulfate 325 (65 FE) MG tablet Take 1 tablet (325 mg total) by mouth 2 (two) times daily with a meal.  60 tablet  3  . fluocinonide cream (LIDEX) 0.05 % Apply 1 application topically 2 (two) times daily.      . Furosemide (LASIX PO) Take 20 mg by mouth daily.      Marland Kitchen glimepiride (AMARYL) 4 MG tablet Take 4 mg by mouth 2 (two) times daily. 1/2 tab am and 1/2 tab pm      . isosorbide mononitrate (IMDUR) 30 MG 24 hr tablet TAKE ONE TABLET BY MOUTH EVERY DAY  30 tablet  4  . lisinopril (PRINIVIL,ZESTRIL) 5 MG tablet Take 5 mg by mouth daily.        . metFORMIN (GLUCOPHAGE) 1000 MG tablet Take 1,000 mg by mouth 2 (two) times daily with a meal.        . nitroGLYCERIN (NITROSTAT) 0.4 MG SL tablet Place 1 tablet (0.4 mg total) under the tongue every 5 (five) minutes as needed for chest pain (Take as needed for chest pain not relieved on regularly scheduled Imdur. ).  25 tablet  3  . pantoprazole (PROTONIX) 40 MG tablet TAKE ONE TABLET BY MOUTH EVERY DAY AT  6  AM  30 tablet  4  . pregabalin (LYRICA) 50 MG capsule Take 50 mg by mouth at bedtime.        . rosuvastatin (CRESTOR) 20 MG tablet Take 20 mg  by mouth at bedtime.        . sitaGLIPtin (JANUVIA) 100 MG tablet Take 100 mg by mouth daily.          Past Medical History  Diagnosis Date  . Diabetes mellitus   . Hypertension   . Hyperlipemia   . Fibromyalgia   . Obesity   . Sleep apnea     No CPAP  . Anxiety   . Hiatal hernia   . NSTEMI (non-ST elevated myocardial infarction) 04/27/2011  . Anemia 04/27/2011  . Restless leg syndrome   . CAD (coronary artery disease)     small dominant right coronary artery with diffuse 95% stenosis. The LAD had 70% stenosis in the mid vessel beyond the diagonal branch but was a small vessel. Circumflex had proximal 30% stenosis.   . Renal insufficiency 05/06/2011  . External hemorrhoids     Past Surgical History  Procedure Date  . Appendectomy   . Fracture surgery     left humerous  . Ganglion cyst removal  ROS:  As stated in the HPI and negative for all other systems.  PHYSICAL EXAM BP 160/80  Pulse 60  Ht 5\' 2"  (1.575 m)  Wt 241 lb (109.317 kg)  BMI 44.08 kg/m2 GENERAL:  Chronically ill appearing HEENT:  Pupils equal round and reactive, fundi not visualized, oral mucosa unremarkable, edentulous NECK:  No jugular venous distention, waveform within normal limits, carotid upstroke brisk and symmetric, no bruits, no thyromegaly LYMPHATICS:  No cervical, inguinal adenopathy LUNGS:  Clear to auscultation bilaterally BACK:  No CVA tenderness CHEST:  Unremarkable HEART:  PMI not displaced or sustained,S1 and S2 within normal limits, no S3, no S4, no clicks, no rubs, no murmurs ABD:  Flat, positive bowel sounds normal in frequency in pitch, no bruits, no rebound, no guarding, no midline pulsatile mass, no hepatomegaly, no splenomegaly, obese EXT:  2 plus pulses throughout, trace edema, no cyanosis no clubbing SKIN:  No rashes no nodules NEURO:  Cranial nerves II through XII grossly intact, motor grossly intact throughout PSYCH:  Cognitively intact, oriented to person place and  time  EKG:  Regular rhythm with premature atrial contractions, possible junctional, right bundle branch block. 10/11/2011   ASSESSMENT AND PLAN is to have a half

## 2011-10-11 NOTE — Patient Instructions (Signed)
The current medical regimen is effective;  continue present plan and medications.  Your physician has recommended that you wear a holter monitor. Holter monitors are medical devices that record the heart's electrical activity. Doctors most often use these monitors to diagnose arrhythmias. Arrhythmias are problems with the speed or rhythm of the heartbeat. The monitor is a small, portable device. You can wear one while you do your normal daily activities. This is usually used to diagnose what is causing palpitations/syncope (passing out).  Follow up in 2 months with Dr Antoine Poche

## 2011-10-11 NOTE — Assessment & Plan Note (Signed)
She is a regular rhythm with no discernible P waves. I will place a 24-hour monitor.

## 2011-10-11 NOTE — Assessment & Plan Note (Signed)
She has no new symptoms and we will follow this clinically.

## 2011-10-20 ENCOUNTER — Encounter (INDEPENDENT_AMBULATORY_CARE_PROVIDER_SITE_OTHER): Payer: Medicare Other

## 2011-10-20 DIAGNOSIS — R002 Palpitations: Secondary | ICD-10-CM

## 2011-11-02 ENCOUNTER — Telehealth: Payer: Self-pay | Admitting: Cardiology

## 2011-11-02 NOTE — Telephone Encounter (Signed)
Reviewed results of monitor with pt

## 2011-11-02 NOTE — Telephone Encounter (Signed)
New msg Pt wants to know monitor results please call

## 2011-11-03 ENCOUNTER — Other Ambulatory Visit (HOSPITAL_COMMUNITY): Payer: Self-pay | Admitting: Family Medicine

## 2011-11-03 ENCOUNTER — Ambulatory Visit (HOSPITAL_COMMUNITY)
Admission: RE | Admit: 2011-11-03 | Discharge: 2011-11-03 | Disposition: A | Discharge status: Home / Self Care | Payer: Medicare Other | Source: Ambulatory Visit | Attending: Family Medicine | Admitting: Family Medicine

## 2011-11-03 DIAGNOSIS — R52 Pain, unspecified: Secondary | ICD-10-CM

## 2011-11-03 DIAGNOSIS — Z9181 History of falling: Secondary | ICD-10-CM | POA: Insufficient documentation

## 2011-11-03 DIAGNOSIS — M19019 Primary osteoarthritis, unspecified shoulder: Secondary | ICD-10-CM | POA: Insufficient documentation

## 2011-11-03 DIAGNOSIS — M25519 Pain in unspecified shoulder: Secondary | ICD-10-CM | POA: Insufficient documentation

## 2011-12-20 ENCOUNTER — Ambulatory Visit: Payer: Medicare Other | Admitting: Cardiology

## 2012-01-11 ENCOUNTER — Emergency Department (HOSPITAL_COMMUNITY): Payer: Medicare Other

## 2012-01-11 ENCOUNTER — Encounter (HOSPITAL_COMMUNITY): Payer: Self-pay

## 2012-01-11 ENCOUNTER — Emergency Department (HOSPITAL_COMMUNITY)
Admission: EM | Admit: 2012-01-11 | Discharge: 2012-01-11 | Disposition: A | Discharge status: Home / Self Care | Payer: Medicare Other | Attending: Emergency Medicine | Admitting: Emergency Medicine

## 2012-01-11 DIAGNOSIS — Z79899 Other long term (current) drug therapy: Secondary | ICD-10-CM | POA: Insufficient documentation

## 2012-01-11 DIAGNOSIS — I252 Old myocardial infarction: Secondary | ICD-10-CM | POA: Insufficient documentation

## 2012-01-11 DIAGNOSIS — I2510 Atherosclerotic heart disease of native coronary artery without angina pectoris: Secondary | ICD-10-CM | POA: Insufficient documentation

## 2012-01-11 DIAGNOSIS — T07XXXA Unspecified multiple injuries, initial encounter: Secondary | ICD-10-CM | POA: Insufficient documentation

## 2012-01-11 DIAGNOSIS — R079 Chest pain, unspecified: Secondary | ICD-10-CM | POA: Insufficient documentation

## 2012-01-11 DIAGNOSIS — R0602 Shortness of breath: Secondary | ICD-10-CM | POA: Insufficient documentation

## 2012-01-11 DIAGNOSIS — R5381 Other malaise: Secondary | ICD-10-CM | POA: Insufficient documentation

## 2012-01-11 DIAGNOSIS — I10 Essential (primary) hypertension: Secondary | ICD-10-CM | POA: Insufficient documentation

## 2012-01-11 DIAGNOSIS — W19XXXA Unspecified fall, initial encounter: Secondary | ICD-10-CM | POA: Insufficient documentation

## 2012-01-11 DIAGNOSIS — E119 Type 2 diabetes mellitus without complications: Secondary | ICD-10-CM | POA: Insufficient documentation

## 2012-01-11 DIAGNOSIS — R531 Weakness: Secondary | ICD-10-CM

## 2012-01-11 DIAGNOSIS — E785 Hyperlipidemia, unspecified: Secondary | ICD-10-CM | POA: Insufficient documentation

## 2012-01-11 DIAGNOSIS — R269 Unspecified abnormalities of gait and mobility: Secondary | ICD-10-CM

## 2012-01-11 LAB — CBC WITH DIFFERENTIAL/PLATELET
Basophils Absolute: 0 10*3/uL (ref 0.0–0.1)
Basophils Relative: 0 % (ref 0–1)
Basophils Relative: 1 % (ref 0–1)
Eosinophils Absolute: 0.3 10*3/uL (ref 0.0–0.7)
Eosinophils Relative: 4 % (ref 0–5)
HCT: 33 % — ABNORMAL LOW (ref 36.0–46.0)
Hemoglobin: 11.2 g/dL — ABNORMAL LOW (ref 12.0–15.0)
MCH: 29.1 pg (ref 26.0–34.0)
MCHC: 32.7 g/dL (ref 30.0–36.0)
MCHC: 33.9 g/dL (ref 30.0–36.0)
MCV: 88.8 fL (ref 78.0–100.0)
Monocytes Absolute: 0.7 10*3/uL (ref 0.1–1.0)
Monocytes Relative: 8 % (ref 3–12)
Neutro Abs: 3.2 10*3/uL (ref 1.7–7.7)
Platelets: 206 10*3/uL (ref 150–400)
RDW: 15.8 % — ABNORMAL HIGH (ref 11.5–15.5)

## 2012-01-11 LAB — COMPREHENSIVE METABOLIC PANEL
ALT: 17 U/L (ref 0–35)
AST: 30 U/L (ref 0–37)
Albumin: 3.5 g/dL (ref 3.5–5.2)
Calcium: 9.9 mg/dL (ref 8.4–10.5)
GFR calc Af Amer: 46 mL/min — ABNORMAL LOW (ref >90)
Glucose, Bld: 101 mg/dL — ABNORMAL HIGH (ref 70–99)
Sodium: 139 mEq/L (ref 135–145)
Total Protein: 7.4 g/dL (ref 6.0–8.3)

## 2012-01-11 LAB — URINALYSIS, ROUTINE W REFLEX MICROSCOPIC
Bilirubin Urine: NEGATIVE
Glucose, UA: NEGATIVE mg/dL
Ketones, ur: NEGATIVE mg/dL
pH: 5.5 (ref 5.0–8.0)

## 2012-01-11 LAB — BASIC METABOLIC PANEL
BUN: 28 mg/dL — ABNORMAL HIGH (ref 6–23)
CO2: 20 mEq/L (ref 19–32)
Chloride: 108 mEq/L (ref 96–112)
Creatinine, Ser: 1.29 mg/dL — ABNORMAL HIGH (ref 0.50–1.10)

## 2012-01-11 LAB — URINE MICROSCOPIC-ADD ON

## 2012-01-11 MED ORDER — SODIUM CHLORIDE 0.9 % IV SOLN
INTRAVENOUS | Status: DC
  Administered 2012-01-11: 125 mL/h via INTRAVENOUS

## 2012-01-11 NOTE — ED Notes (Signed)
Reports she was taking a bath and fell this am while getting out of the tub, pt reports she was unable to get her self up, family assisted pt to a standing position. Pt denies hitting her head or LOC. Pt reports she is having all over body pain. Pt reports she was unable to get her knee up and fell over into the floor.

## 2012-01-11 NOTE — ED Notes (Signed)
Patient was able to ambulate with her walker with stand by assist. Patient also began to feel extremely dizzy and winded.

## 2012-01-11 NOTE — ED Notes (Signed)
Pt reports (L) side chest pain on Monday and took nitro x1 w/relief, pt denies active chest pain, ekg ordered and completed

## 2012-01-11 NOTE — ED Provider Notes (Cosign Needed Addendum)
History     CSN: [id number]  Arrival date & time 01/11/12  5621   First MD Initiated Contact with Patient 01/11/12 1341      Chief Complaint  Patient presents with  . Fall    (Consider location/radiation/quality/duration/timing/severity/associated sxs/prior treatment) HPI Comments: [name] is a 71 y.o. female who has fallen several times this week, injuring her knees each time. Since last fall, yesterday she's had more trouble walking, than usual.  She feels overall weak. She denies head injury, neck pain, back pain, nausea, vomiting, chest pain, cough, change in bowel or urinary habits. No other aggravating symptoms. There are no palliative factors.  Patient is a 71 y.o. female presenting with fall. The history is provided by the patient.  Fall    Past Medical History  Diagnosis Date  . Diabetes mellitus   . Hypertension   . Hyperlipemia   . Fibromyalgia   . Obesity   . Sleep apnea     No CPAP  . Anxiety   . Hiatal hernia   . NSTEMI (non-ST elevated myocardial infarction) 04/27/2011  . Anemia 04/27/2011  . Restless leg syndrome   . CAD (coronary artery disease)     small dominant right coronary artery with diffuse 95% stenosis. The LAD had 70% stenosis in the mid vessel beyond the diagonal branch but was a small vessel. Circumflex had proximal 30% stenosis.   . Renal insufficiency 05/06/2011  . External hemorrhoids   . Arrhythmia 10/11/2011    Past Surgical History  Procedure Date  . Appendectomy   . Fracture surgery     left humerous  . Ganglion cyst removal     Family History  Problem Relation Age of Onset  . Stomach cancer Mother   . Heart disease Maternal Grandmother   . Heart disease Maternal Grandfather     History  Substance Use Topics  . Smoking status: Never Smoker   . Smokeless tobacco: Never Used  . Alcohol Use: No    OB History    Grav Para Term Preterm Abortions TAB SAB Ect Mult Living                  Review of Systems  All other  systems reviewed and are negative.    Allergies  Codeine  Home Medications   Current Outpatient Rx  Name Route Sig Dispense Refill  . ACETAMINOPHEN ER 650 MG PO TBCR Oral Take 1,300 mg by mouth 3 (three) times daily.    . ASPIRIN EC 81 MG PO TBEC Oral Take 81 mg by mouth daily as needed. For pain    . CARVEDILOL 6.25 MG PO TABS Oral Take 6.25 mg by mouth 2 (two) times daily with a meal.    . FERROUS SULFATE 325 (65 FE) MG PO TABS Oral Take 325 mg by mouth daily with breakfast.    . LASIX PO Oral Take 20 mg by mouth daily.    Marland Kitchen GLIMEPIRIDE 4 MG PO TABS Oral Take 4 mg by mouth 2 (two) times daily.     . ISOSORBIDE MONONITRATE ER 30 MG PO TB24 Oral Take 30 mg by mouth daily.    Marland Kitchen LISINOPRIL 5 MG PO TABS Oral Take 5 mg by mouth daily.      Marland Kitchen METFORMIN HCL 1000 MG PO TABS Oral Take 2,000 mg by mouth 2 (two) times daily with a meal.     . NITROGLYCERIN 0.4 MG SL SUBL Sublingual Place 1 tablet (0.4 mg total) under  the tongue every 5 (five) minutes as needed for chest pain (Take as needed for chest pain not relieved on regularly scheduled Imdur. ). 25 tablet 3    Take 1 tablet, wait 5 minutes, if pain unrelieved, ...  . PANTOPRAZOLE SODIUM 40 MG PO TBEC Oral Take 40 mg by mouth daily.    Marland Kitchen PREGABALIN 50 MG PO CAPS Oral Take 50 mg by mouth at bedtime.      Marland Kitchen ROSUVASTATIN CALCIUM 20 MG PO TABS Oral Take 20 mg by mouth at bedtime.      Marland Kitchen SITAGLIPTIN PHOSPHATE 100 MG PO TABS Oral Take 100 mg by mouth daily.      Marland Kitchen VITAMIN B-12 1000 MCG PO TABS Oral Take 1,000 mcg by mouth daily.      BP 137/87  Pulse 79  Temp 97.5 F (36.4 C) (Oral)  Resp 20  SpO2 100%  Physical Exam  Nursing note and vitals reviewed. Constitutional: She is oriented to person, place, and time. She appears well-developed and well-nourished.       She is obese  HENT:  Head: Normocephalic and atraumatic.  Eyes: Conjunctivae and EOM are normal. Pupils are equal, round, and reactive to light.  Neck: Normal range of motion  and phonation normal. Neck supple.  Cardiovascular: Normal rate, regular rhythm and intact distal pulses.   Pulmonary/Chest: Effort normal and breath sounds normal. She exhibits no tenderness.  Abdominal: Soft. She exhibits no distension. There is no tenderness. There is no guarding.  Musculoskeletal: Normal range of motion.       Mild bruising. Both anterior knees, no deformity or instability. She has good active range of motion of the legs while supine in bed.  Neurological: She is alert and oriented to person, place, and time. She has normal strength. No cranial nerve deficit. She exhibits normal muscle tone. Coordination normal.  Skin: Skin is warm and dry.  Psychiatric: She has a normal mood and affect. Her behavior is normal. Judgment and thought content normal.    ED Course  Procedures (including critical care time)      Date: 02/19/2012  Rate: 77  Rhythm: normal sinus rhythm  QRS Axis: left  Intervals: normal  ST/T Wave abnormalities: normal  Conduction Disutrbances:right bundle branch block  Narrative Interpretation: P waves more visible now.  Old EKG Reviewed: changes noted     Labs Reviewed  CBC WITH DIFFERENTIAL - Abnormal; Notable for the following:    RBC 3.75 (*)     Hemoglobin 10.9 (*)     HCT 33.3 (*)     RDW 15.8 (*)     All other components within normal limits  COMPREHENSIVE METABOLIC PANEL - Abnormal; Notable for the following:    Glucose, Bld 101 (*)     BUN 29 (*)     Creatinine, Ser 1.32 (*)     GFR calc non Af Amer 40 (*)     GFR calc Af Amer 46 (*)     All other components within normal limits  CBC WITH DIFFERENTIAL - Abnormal; Notable for the following:    RBC 3.70 (*)     Hemoglobin 11.2 (*)     HCT 33.0 (*)     RDW 15.9 (*)     Neutrophils Relative 39 (*)     Lymphocytes Relative 48 (*)     All other components within normal limits  BASIC METABOLIC PANEL - Abnormal; Notable for the following:    Glucose, Bld 107 (*)  BUN 28 (*)       Creatinine, Ser 1.29 (*)     GFR calc non Af Amer 41 (*)     GFR calc Af Amer 47 (*)     All other components within normal limits  URINALYSIS, ROUTINE W REFLEX MICROSCOPIC - Abnormal; Notable for the following:    APPearance CLOUDY (*)     Hgb urine dipstick MODERATE (*)     Protein, ur >300 (*)     All other components within normal limits  URINE MICROSCOPIC-ADD ON - Abnormal; Notable for the following:    Casts GRANULAR CAST (*)  HYALINE CASTS   All other components within normal limits  URINE CULTURE   Dg Chest 2 View  01/11/2012  *RADIOLOGY REPORT*  Clinical Data: Chest pain, shortness of breath.  CHEST - 2 VIEW  Comparison: 05/03/2011  Findings: Mild cardiomegaly.  Lungs are clear.  No effusions or edema.  No acute bony abnormality.  Mild degenerative changes in the lower thoracic spine.  IMPRESSION: Cardiomegaly.  No active disease.  Original Report Authenticated By: Cyndie Chime, M.D.   Ct Head Wo Contrast  01/11/2012  *RADIOLOGY REPORT*  Clinical Data: Fall.  CT HEAD WITHOUT CONTRAST  Technique:  Contiguous axial images were obtained from the base of the skull through the vertex without contrast.  Comparison: None.  Findings: Moderate chronic small vessel disease changes throughout the deep white matter. No acute intracranial abnormality. Specifically, no hemorrhage, hydrocephalus, mass lesion, acute infarction, or significant intracranial injury.  No acute calvarial abnormality. Visualized paranasal sinuses and mastoids clear. Orbital soft tissues unremarkable.  IMPRESSION: Chronic small vessel disease. No acute intracranial abnormality.  Original Report Authenticated By: Cyndie Chime, M.D.   Dg Knee Complete 4 Views Left  01/11/2012  *RADIOLOGY REPORT*  Clinical Data: Fall, knee pain.  LEFT KNEE - COMPLETE 4+ VIEW  Comparison: Right knee performed today.  Findings: Mild joint space narrowing within the medial compartment. Chondrocalcinosis present. No acute bony abnormality.  Specifically, no fracture, subluxation, or dislocation.  Soft tissues are intact.  No joint effusion.  IMPRESSION: No acute bony abnormality.  Original Report Authenticated By: Cyndie Chime, M.D.   Dg Knee Complete 4 Views Right  01/11/2012  *RADIOLOGY REPORT*  Clinical Data: Fall, knee pain.  RIGHT KNEE - COMPLETE 4+ VIEW  Comparison: 02/09/2011  Findings: Mild joint space narrowing within the medial compartment. Chondrocalcinosis present. No acute bony abnormality. Specifically, no fracture, subluxation, or dislocation.  Soft tissues are intact.  No joint effusion.  IMPRESSION: No acute bony abnormality.  Original Report Authenticated By: Cyndie Chime, M.D.     1. Contusion, multiple sites   2. Weakness   3. Gait disorder       MDM  Multiple falls, with nonspecific weakness, no serious injuries. Patient is ambulatory, and can be discharged. Home health evaluation for physical therapy is arranged.Doubt metabolic instability, serious bacterial infection or impending vascular collapse; the patient is stable for discharge.   Plan: Home Medications- usual; Home Treatments- rest, fluids; Recommended follow up- PCP 1 week        Flint Melter, MD 01/12/12 1914  Flint Melter, MD 02/19/12 [phone]

## 2012-01-12 LAB — URINE CULTURE: Colony Count: NO GROWTH

## 2012-01-31 ENCOUNTER — Encounter: Payer: Self-pay | Admitting: Cardiology

## 2012-01-31 ENCOUNTER — Ambulatory Visit (INDEPENDENT_AMBULATORY_CARE_PROVIDER_SITE_OTHER): Payer: Medicare Other | Admitting: Cardiology

## 2012-01-31 VITALS — BP 190/70 | HR 71 | Ht 62.0 in | Wt 236.0 lb

## 2012-01-31 DIAGNOSIS — I119 Hypertensive heart disease without heart failure: Secondary | ICD-10-CM

## 2012-01-31 DIAGNOSIS — I2510 Atherosclerotic heart disease of native coronary artery without angina pectoris: Secondary | ICD-10-CM

## 2012-01-31 DIAGNOSIS — I499 Cardiac arrhythmia, unspecified: Secondary | ICD-10-CM

## 2012-01-31 DIAGNOSIS — I214 Non-ST elevation (NSTEMI) myocardial infarction: Secondary | ICD-10-CM

## 2012-01-31 MED ORDER — LISINOPRIL 10 MG PO TABS: 10.0000 mg | ORAL_TABLET | Freq: Every day | ORAL | Status: DC

## 2012-01-31 NOTE — Patient Instructions (Addendum)
Please increase your Lisinopril to 10 mg a day  Continue all other medications as listed.  Please keep a blood pressure dairy.  Follow up with Dr Lysbeth Galas for low blood sugars.  Follow up with Dr Antoine Poche in 4 months

## 2012-01-31 NOTE — Progress Notes (Signed)
The patient presents for followup if CAD.  Since I last saw her she has done well.  The patient denies any new symptoms such as chest discomfort, neck or arm discomfort. There has been no new shortness of breath, PND or orthopnea. There have been no reported palpitations, presyncope or syncope.  She did have a fall getting out of the bathtub. She's not supposed to use the bathtub. She was seen in the emergency room and I did review these records. There was no syncope.  She's also had problems with low blood sugars at times with one sugar reported as 38. I reviewed these records and she does get symptomatic with this. At the last visit she had a possible junctional rhythm. I did put a Holter on which demonst sinus with atrial tachycardia but no sustained dysrhythmias or bradycardia arrhythmias.  She's had no symptomatic tachypalpitations since that time.   She's had no new chest pressure, neck or arm discomfort. She's had no new shortness of breath, PND or orthopnea.   Allergies  Allergen Reactions  . Codeine Other (See Comments)    Childhood allergy    Current Outpatient Prescriptions  Medication Sig Dispense Refill  . acetaminophen (TYLENOL) 650 MG CR tablet Take 1,300 mg by mouth 3 (three) times daily.      Marland Kitchen aspirin EC 81 MG tablet Take 81 mg by mouth daily as needed. For pain      . carvedilol (COREG) 6.25 MG tablet Take 6.25 mg by mouth 2 (two) times daily with a meal.      . ferrous sulfate 325 (65 FE) MG tablet Take 325 mg by mouth daily with breakfast.      . Furosemide (LASIX PO) Take 20 mg by mouth daily.      Marland Kitchen glimepiride (AMARYL) 4 MG tablet Take 4 mg by mouth 2 (two) times daily.       . isosorbide mononitrate (IMDUR) 30 MG 24 hr tablet Take 30 mg by mouth daily.      Marland Kitchen lisinopril (PRINIVIL,ZESTRIL) 5 MG tablet Take 5 mg by mouth daily.        . metFORMIN (GLUCOPHAGE) 1000 MG tablet Take 2,000 mg by mouth 2 (two) times daily with a meal.       . nitroGLYCERIN (NITROSTAT) 0.4 MG  SL tablet Place 1 tablet (0.4 mg total) under the tongue every 5 (five) minutes as needed for chest pain (Take as needed for chest pain not relieved on regularly scheduled Imdur. ).  25 tablet  3  . pantoprazole (PROTONIX) 40 MG tablet Take 40 mg by mouth daily.      . pregabalin (LYRICA) 50 MG capsule Take 50 mg by mouth at bedtime.        . rosuvastatin (CRESTOR) 20 MG tablet Take 20 mg by mouth at bedtime.        . sitaGLIPtin (JANUVIA) 100 MG tablet Take 100 mg by mouth daily.        . vitamin B-12 (CYANOCOBALAMIN) 1000 MCG tablet Take 1,000 mcg by mouth daily.        Past Medical History  Diagnosis Date  . Diabetes mellitus   . Hypertension   . Hyperlipemia   . Fibromyalgia   . Obesity   . Sleep apnea     No CPAP  . Anxiety   . Hiatal hernia   . NSTEMI (non-ST elevated myocardial infarction) 04/27/2011  . Anemia 04/27/2011  . Restless leg syndrome   . CAD (coronary artery  disease)     small dominant right coronary artery with diffuse 95% stenosis. The LAD had 70% stenosis in the mid vessel beyond the diagonal branch but was a small vessel. Circumflex had proximal 30% stenosis.   . Renal insufficiency 05/06/2011  . External hemorrhoids   . Arrhythmia 10/11/2011    Past Surgical History  Procedure Date  . Appendectomy   . Fracture surgery     left humerous  . Ganglion cyst removal     ROS:  As stated in the HPI and negative for all other systems.  PHYSICAL EXAM BP 190/70  Pulse 71  Ht 5\' 2"  (1.575 m)  Wt 236 lb (107.049 kg)  BMI 43.17 kg/m2 GENERAL:  Chronically ill appearing HEENT:  Pupils equal round and reactive, fundi not visualized, oral mucosa unremarkable, edentulous NECK:  No jugular venous distention, waveform within normal limits, carotid upstroke brisk and symmetric, no bruits, no thyromegaly LYMPHATICS:  No cervical, inguinal adenopathy LUNGS:  Clear to auscultation bilaterally BACK:  No CVA tenderness CHEST:  Unremarkable HEART:  PMI not displaced or  sustained,S1 and S2 within normal limits, no S3, no S4, no clicks, no rubs, no murmurs ABD:  Flat, positive bowel sounds normal in frequency in pitch, no bruits, no rebound, no guarding, no midline pulsatile mass, no hepatomegaly, no splenomegaly, obese EXT:  2 plus pulses throughout, trace edema, no cyanosis no clubbing SKIN:  No rashes no nodules NEURO:  Cranial nerves II through XII grossly intact, motor grossly intact throughout PSYCH:  Cognitively intact, oriented to person place and time   ASSESSMENT AND PLAN   CAD (coronary artery disease) -  She has no new symptoms and we will follow this clinically.  No further cardiovascular testing is suggested.   Arrhythmia -  She doesn't have evidence of atrial fibrillation, sustained symptomatic tachyarrhythmias or bradycardia arrhythmias. No change in therapy is suggested.   Diabetes -  She has had some recent hypoglycemic episodes and I told her she needs to drop this list all for Josue Hector, MD.  She should schedule followup in that office.    Hypertension -  I increased her lisinopril to 10 mg daily. I reviewed recent blood pressures and they have been typically been this elevated. She's going to keep a blood pressure diary at home.

## 2012-04-06 ENCOUNTER — Other Ambulatory Visit: Payer: Self-pay | Admitting: Cardiology

## 2012-04-08 ENCOUNTER — Other Ambulatory Visit: Payer: Self-pay | Admitting: Cardiology

## 2012-04-08 NOTE — Telephone Encounter (Signed)
..   Requested Prescriptions   Pending Prescriptions Disp Refills  . pantoprazole (PROTONIX) 40 MG tablet [Pharmacy Med Name: PANTOPRAZOLE 40MG    TAB] 30 tablet 3    Sig: TAKE ONE TABLET BY MOUTH EVERY DAY AT  6  AM  . isosorbide mononitrate (IMDUR) 30 MG 24 hr tablet [Pharmacy Med Name: ISOSORB MONO 30MG  ERTAB] 30 tablet 6    Sig: TAKE ONE TABLET BY MOUTH EVERY DAY

## 2012-04-17 ENCOUNTER — Other Ambulatory Visit: Payer: Self-pay | Admitting: Cardiology

## 2012-05-08 ENCOUNTER — Ambulatory Visit (INDEPENDENT_AMBULATORY_CARE_PROVIDER_SITE_OTHER): Payer: Medicare Other | Admitting: Cardiology

## 2012-05-08 DIAGNOSIS — R0989 Other specified symptoms and signs involving the circulatory and respiratory systems: Secondary | ICD-10-CM

## 2012-05-14 ENCOUNTER — Encounter: Payer: Self-pay | Admitting: Cardiology

## 2012-06-24 ENCOUNTER — Encounter (HOSPITAL_COMMUNITY): Payer: Self-pay | Admitting: Family Medicine

## 2012-06-24 ENCOUNTER — Emergency Department (HOSPITAL_COMMUNITY): Payer: Medicare Other

## 2012-06-24 ENCOUNTER — Other Ambulatory Visit: Payer: Self-pay

## 2012-06-24 ENCOUNTER — Inpatient Hospital Stay (HOSPITAL_COMMUNITY)
Admission: EM | Admit: 2012-06-24 | Discharge: 2012-06-29 | DRG: 194 | Disposition: A | Discharge status: Home / Self Care | Payer: Medicare Other | Attending: Internal Medicine | Admitting: Internal Medicine

## 2012-06-24 DIAGNOSIS — D649 Anemia, unspecified: Secondary | ICD-10-CM

## 2012-06-24 DIAGNOSIS — N189 Chronic kidney disease, unspecified: Secondary | ICD-10-CM | POA: Diagnosis present

## 2012-06-24 DIAGNOSIS — I129 Hypertensive chronic kidney disease with stage 1 through stage 4 chronic kidney disease, or unspecified chronic kidney disease: Secondary | ICD-10-CM | POA: Diagnosis present

## 2012-06-24 DIAGNOSIS — E119 Type 2 diabetes mellitus without complications: Secondary | ICD-10-CM | POA: Diagnosis present

## 2012-06-24 DIAGNOSIS — J189 Pneumonia, unspecified organism: Principal | ICD-10-CM

## 2012-06-24 DIAGNOSIS — I2510 Atherosclerotic heart disease of native coronary artery without angina pectoris: Secondary | ICD-10-CM | POA: Diagnosis present

## 2012-06-24 DIAGNOSIS — N179 Acute kidney failure, unspecified: Secondary | ICD-10-CM | POA: Diagnosis present

## 2012-06-24 DIAGNOSIS — D631 Anemia in chronic kidney disease: Secondary | ICD-10-CM | POA: Diagnosis present

## 2012-06-24 DIAGNOSIS — G2581 Restless legs syndrome: Secondary | ICD-10-CM | POA: Diagnosis present

## 2012-06-24 DIAGNOSIS — I252 Old myocardial infarction: Secondary | ICD-10-CM

## 2012-06-24 DIAGNOSIS — E1142 Type 2 diabetes mellitus with diabetic polyneuropathy: Secondary | ICD-10-CM | POA: Diagnosis present

## 2012-06-24 DIAGNOSIS — F411 Generalized anxiety disorder: Secondary | ICD-10-CM | POA: Diagnosis present

## 2012-06-24 DIAGNOSIS — K449 Diaphragmatic hernia without obstruction or gangrene: Secondary | ICD-10-CM | POA: Diagnosis present

## 2012-06-24 DIAGNOSIS — IMO0001 Reserved for inherently not codable concepts without codable children: Secondary | ICD-10-CM | POA: Diagnosis present

## 2012-06-24 DIAGNOSIS — E669 Obesity, unspecified: Secondary | ICD-10-CM | POA: Diagnosis present

## 2012-06-24 DIAGNOSIS — R197 Diarrhea, unspecified: Secondary | ICD-10-CM | POA: Diagnosis present

## 2012-06-24 DIAGNOSIS — K644 Residual hemorrhoidal skin tags: Secondary | ICD-10-CM | POA: Diagnosis present

## 2012-06-24 DIAGNOSIS — E785 Hyperlipidemia, unspecified: Secondary | ICD-10-CM | POA: Diagnosis present

## 2012-06-24 HISTORY — DX: Pneumonia, unspecified organism: J18.9

## 2012-06-24 LAB — CBC
MCH: 29.4 pg (ref 26.0–34.0)
MCV: 90.6 fL (ref 78.0–100.0)
Platelets: 137 10*3/uL — ABNORMAL LOW (ref 150–400)
RBC: 3.2 MIL/uL — ABNORMAL LOW (ref 3.87–5.11)
RDW: 13.4 % (ref 11.5–15.5)
WBC: 8.4 10*3/uL (ref 4.0–10.5)

## 2012-06-24 LAB — CBC WITH DIFFERENTIAL/PLATELET
Basophils Absolute: 0 10*3/uL (ref 0.0–0.1)
Basophils Relative: 0 % (ref 0–1)
Eosinophils Absolute: 0 10*3/uL (ref 0.0–0.7)
Eosinophils Relative: 0 % (ref 0–5)
HCT: 29.7 % — ABNORMAL LOW (ref 36.0–46.0)
Hemoglobin: 9.6 g/dL — ABNORMAL LOW (ref 12.0–15.0)
Lymphocytes Relative: 19 % (ref 12–46)
Lymphs Abs: 1.6 10*3/uL (ref 0.7–4.0)
MCH: 29.3 pg (ref 26.0–34.0)
MCHC: 32.3 g/dL (ref 30.0–36.0)
MCV: 90.5 fL (ref 78.0–100.0)
Monocytes Absolute: 0.5 10*3/uL (ref 0.1–1.0)
Monocytes Relative: 6 % (ref 3–12)
Neutro Abs: 6.6 10*3/uL (ref 1.7–7.7)
Neutrophils Relative %: 75 % (ref 43–77)
Platelets: 145 10*3/uL — ABNORMAL LOW (ref 150–400)
RBC: 3.28 MIL/uL — ABNORMAL LOW (ref 3.87–5.11)
RDW: 13.4 % (ref 11.5–15.5)
WBC: 8.8 10*3/uL (ref 4.0–10.5)

## 2012-06-24 LAB — POCT I-STAT TROPONIN I: Troponin i, poc: 0.03 ng/mL (ref 0.00–0.08)

## 2012-06-24 LAB — COMPREHENSIVE METABOLIC PANEL
AST: 14 U/L (ref 0–37)
Albumin: 2.8 g/dL — ABNORMAL LOW (ref 3.5–5.2)
Alkaline Phosphatase: 64 U/L (ref 39–117)
BUN: 28 mg/dL — ABNORMAL HIGH (ref 6–23)
Chloride: 95 mEq/L — ABNORMAL LOW (ref 96–112)
Potassium: 3.7 mEq/L (ref 3.5–5.1)
Total Bilirubin: 0.2 mg/dL — ABNORMAL LOW (ref 0.3–1.2)
Total Protein: 6.6 g/dL (ref 6.0–8.3)

## 2012-06-24 LAB — CREATININE, SERUM: Creatinine, Ser: 1.59 mg/dL — ABNORMAL HIGH (ref 0.50–1.10)

## 2012-06-24 LAB — GLUCOSE, CAPILLARY

## 2012-06-24 LAB — PRO B NATRIURETIC PEPTIDE: Pro B Natriuretic peptide (BNP): 4638 pg/mL — ABNORMAL HIGH (ref 0–125)

## 2012-06-24 MED ORDER — FERROUS SULFATE 325 (65 FE) MG PO TABS
325.0000 mg | ORAL_TABLET | Freq: Every day | ORAL | Status: DC
  Administered 2012-06-25 – 2012-06-29 (×5): 325 mg via ORAL
  Filled 2012-06-24 (×6): qty 1

## 2012-06-24 MED ORDER — ONDANSETRON HCL 4 MG/2ML IJ SOLN: 4.0000 mg | Freq: Four times a day (QID) | INTRAMUSCULAR | Status: DC | PRN

## 2012-06-24 MED ORDER — DEXTROSE 5 % IV SOLN
500.0000 mg | INTRAVENOUS | Status: DC
  Administered 2012-06-24 – 2012-06-25 (×2): 500 mg via INTRAVENOUS
  Filled 2012-06-24 (×3): qty 500

## 2012-06-24 MED ORDER — LINAGLIPTIN 5 MG PO TABS
5.0000 mg | ORAL_TABLET | Freq: Every day | ORAL | Status: DC
  Administered 2012-06-25 – 2012-06-28 (×4): 5 mg via ORAL
  Filled 2012-06-24 (×5): qty 1

## 2012-06-24 MED ORDER — NITROGLYCERIN 0.4 MG SL SUBL: 0.4000 mg | SUBLINGUAL_TABLET | SUBLINGUAL | Status: DC | PRN

## 2012-06-24 MED ORDER — ACETAMINOPHEN 325 MG PO TABS
650.0000 mg | ORAL_TABLET | Freq: Once | ORAL | Status: AC
  Administered 2012-06-24: 650 mg via ORAL
  Filled 2012-06-24: qty 2

## 2012-06-24 MED ORDER — ISOSORBIDE MONONITRATE ER 30 MG PO TB24
30.0000 mg | ORAL_TABLET | Freq: Every day | ORAL | Status: DC
  Administered 2012-06-25 – 2012-06-29 (×5): 30 mg via ORAL
  Filled 2012-06-24 (×6): qty 1

## 2012-06-24 MED ORDER — ACETAMINOPHEN 325 MG PO TABS
650.0000 mg | ORAL_TABLET | Freq: Four times a day (QID) | ORAL | Status: DC | PRN
  Administered 2012-06-25 – 2012-06-29 (×5): 650 mg via ORAL
  Filled 2012-06-24 (×5): qty 2

## 2012-06-24 MED ORDER — ENOXAPARIN SODIUM 40 MG/0.4ML ~~LOC~~ SOLN
40.0000 mg | SUBCUTANEOUS | Status: DC
  Administered 2012-06-24 – 2012-06-28 (×5): 40 mg via SUBCUTANEOUS
  Filled 2012-06-24 (×6): qty 0.4

## 2012-06-24 MED ORDER — DOCUSATE SODIUM 100 MG PO CAPS
100.0000 mg | ORAL_CAPSULE | Freq: Every day | ORAL | Status: DC
  Administered 2012-06-25 – 2012-06-29 (×5): 100 mg via ORAL
  Filled 2012-06-24 (×5): qty 1

## 2012-06-24 MED ORDER — DEXTROSE 5 % IV SOLN: 1.0000 g | INTRAVENOUS | Status: DC

## 2012-06-24 MED ORDER — INSULIN ASPART 100 UNIT/ML ~~LOC~~ SOLN
0.0000 [IU] | Freq: Three times a day (TID) | SUBCUTANEOUS | Status: DC
  Administered 2012-06-25: 3 [IU] via SUBCUTANEOUS
  Administered 2012-06-25: 2 [IU] via SUBCUTANEOUS
  Administered 2012-06-25: 3 [IU] via SUBCUTANEOUS
  Administered 2012-06-26: 2 [IU] via SUBCUTANEOUS
  Administered 2012-06-26: 5 [IU] via SUBCUTANEOUS
  Administered 2012-06-26: 3 [IU] via SUBCUTANEOUS
  Administered 2012-06-27 – 2012-06-28 (×4): 2 [IU] via SUBCUTANEOUS
  Administered 2012-06-28: 1 [IU] via SUBCUTANEOUS
  Administered 2012-06-28 – 2012-06-29 (×2): 2 [IU] via SUBCUTANEOUS

## 2012-06-24 MED ORDER — SODIUM CHLORIDE 0.9 % IJ SOLN
3.0000 mL | Freq: Two times a day (BID) | INTRAMUSCULAR | Status: DC
  Administered 2012-06-24 – 2012-06-27 (×4): 3 mL via INTRAVENOUS

## 2012-06-24 MED ORDER — ONDANSETRON HCL 4 MG PO TABS: 4.0000 mg | ORAL_TABLET | Freq: Four times a day (QID) | ORAL | Status: DC | PRN

## 2012-06-24 MED ORDER — PANTOPRAZOLE SODIUM 40 MG PO TBEC
40.0000 mg | DELAYED_RELEASE_TABLET | Freq: Every day | ORAL | Status: DC
  Administered 2012-06-25 – 2012-06-29 (×5): 40 mg via ORAL
  Filled 2012-06-24 (×4): qty 1

## 2012-06-24 MED ORDER — SODIUM CHLORIDE 0.9 % IV BOLUS (SEPSIS)
1000.0000 mL | Freq: Once | INTRAVENOUS | Status: AC
  Administered 2012-06-24: 1000 mL via INTRAVENOUS

## 2012-06-24 MED ORDER — DEXTROSE 5 % IV SOLN: 500.0000 mg | INTRAVENOUS | Status: DC

## 2012-06-24 MED ORDER — ACETAMINOPHEN 650 MG RE SUPP: 650.0000 mg | Freq: Four times a day (QID) | RECTAL | Status: DC | PRN

## 2012-06-24 MED ORDER — ASPIRIN EC 81 MG PO TBEC
81.0000 mg | DELAYED_RELEASE_TABLET | Freq: Every day | ORAL | Status: DC
  Administered 2012-06-25 – 2012-06-29 (×5): 81 mg via ORAL
  Filled 2012-06-24 (×5): qty 1

## 2012-06-24 MED ORDER — CARVEDILOL 6.25 MG PO TABS
6.2500 mg | ORAL_TABLET | Freq: Two times a day (BID) | ORAL | Status: DC
  Administered 2012-06-25 – 2012-06-29 (×9): 6.25 mg via ORAL
  Filled 2012-06-24 (×11): qty 1

## 2012-06-24 MED ORDER — SODIUM CHLORIDE 0.9 % IJ SOLN
3.0000 mL | Freq: Two times a day (BID) | INTRAMUSCULAR | Status: DC
  Administered 2012-06-25 – 2012-06-27 (×2): 3 mL via INTRAVENOUS

## 2012-06-24 MED ORDER — VITAMIN B-12 1000 MCG PO TABS
1000.0000 ug | ORAL_TABLET | Freq: Every day | ORAL | Status: DC
  Administered 2012-06-25 – 2012-06-29 (×5): 1000 ug via ORAL
  Filled 2012-06-24 (×5): qty 1

## 2012-06-24 MED ORDER — LISINOPRIL 20 MG PO TABS
20.0000 mg | ORAL_TABLET | Freq: Every morning | ORAL | Status: DC
  Filled 2012-06-24: qty 1

## 2012-06-24 MED ORDER — DEXTROSE 5 % IV SOLN
1.0000 g | INTRAVENOUS | Status: DC
  Administered 2012-06-24 – 2012-06-28 (×5): 1 g via INTRAVENOUS
  Filled 2012-06-24 (×6): qty 10

## 2012-06-24 MED ORDER — ATORVASTATIN CALCIUM 40 MG PO TABS
40.0000 mg | ORAL_TABLET | Freq: Every day | ORAL | Status: DC
  Administered 2012-06-25 – 2012-06-28 (×4): 40 mg via ORAL
  Filled 2012-06-24 (×5): qty 1

## 2012-06-24 MED ORDER — PREGABALIN 50 MG PO CAPS
50.0000 mg | ORAL_CAPSULE | Freq: Two times a day (BID) | ORAL | Status: DC
  Administered 2012-06-24 – 2012-06-29 (×10): 50 mg via ORAL
  Filled 2012-06-24 (×10): qty 1

## 2012-06-24 MED ORDER — GLIMEPIRIDE 4 MG PO TABS
4.0000 mg | ORAL_TABLET | Freq: Two times a day (BID) | ORAL | Status: DC
  Filled 2012-06-24 (×3): qty 1

## 2012-06-24 NOTE — ED Notes (Addendum)
Per pt having hyperglycemia, and chest cold. sts hurts when she breathes. sts her mouth is dry. sts she is weak.

## 2012-06-24 NOTE — ED Provider Notes (Signed)
History     CSN: [id number]  Arrival date & time 06/24/12  1505   First MD Initiated Contact with Patient 06/24/12 1712      Chief Complaint  Patient presents with  . Hyperglycemia    (Consider location/radiation/quality/duration/timing/severity/associated sxs/prior treatment) Patient is a 72 y.o. female presenting with general illness. The history is provided by the patient and a relative.  Illness  The current episode started today. The problem occurs frequently. The problem has been gradually worsening. The problem is mild. Nothing relieves the symptoms. The symptoms are aggravated by activity. Associated symptoms include a fever, congestion and cough. Pertinent negatives include no photophobia, no abdominal pain, no diarrhea, no nausea, no vomiting, no headaches, no rhinorrhea, no sore throat, no muscle aches, no wheezing and no rash. She has been less active. She has been drinking less than usual. The last void occurred less than 6 hours ago. There were no sick contacts. She has received no recent medical care.    Past Medical History  Diagnosis Date  . Diabetes mellitus   . Hypertension   . Hyperlipemia   . Fibromyalgia   . Obesity   . Sleep apnea     No CPAP  . Anxiety   . Hiatal hernia   . NSTEMI (non-ST elevated myocardial infarction) 04/27/2011  . Anemia 04/27/2011  . Restless leg syndrome   . CAD (coronary artery disease)     small dominant right coronary artery with diffuse 95% stenosis. The LAD had 70% stenosis in the mid vessel beyond the diagonal branch but was a small vessel. Circumflex had proximal 30% stenosis.   . Renal insufficiency 05/06/2011  . External hemorrhoids   . Arrhythmia 10/11/2011  . Community acquired pneumonia 06/24/2012    Past Surgical History  Procedure Date  . Appendectomy   . Fracture surgery     left humerous  . Ganglion cyst removal     Family History  Problem Relation Age of Onset  . Stomach cancer Mother   . Heart disease  Maternal Grandmother   . Heart disease Maternal Grandfather     History  Substance Use Topics  . Smoking status: Never Smoker   . Smokeless tobacco: Never Used  . Alcohol Use: No    OB History    Grav Para Term Preterm Abortions TAB SAB Ect Mult Living                  Review of Systems  Constitutional: Positive for fever. Negative for fatigue.  HENT: Positive for congestion. Negative for sore throat, rhinorrhea and postnasal drip.   Eyes: Negative for photophobia and visual disturbance.  Respiratory: Positive for cough. Negative for chest tightness, shortness of breath and wheezing.   Cardiovascular: Negative for chest pain, palpitations and leg swelling.  Gastrointestinal: Negative for nausea, vomiting, abdominal pain and diarrhea.  Genitourinary: Negative for urgency, frequency and difficulty urinating.  Musculoskeletal: Negative for back pain and arthralgias.  Skin: Negative for rash and wound.  Neurological: Negative for weakness and headaches.  Psychiatric/Behavioral: Negative for confusion and agitation.    Allergies  Codeine  Home Medications   No current outpatient prescriptions on file.  BP 139/70  Pulse 95  Temp 97.5 F (36.4 C) (Oral)  Resp 25  Ht 5\' 3"  (1.6 m)  Wt 232 lb 2.3 oz (105.3 kg)  BMI 41.12 kg/m2  SpO2 93%  Physical Exam  Nursing note and vitals reviewed. Constitutional: She is oriented to person, place, and time. She appears  well-developed and well-nourished. No distress.  HENT:  Head: Normocephalic and atraumatic.  Mouth/Throat: Oropharynx is clear and moist.  Eyes: EOM are normal. Pupils are equal, round, and reactive to light.  Neck: Normal range of motion. Neck supple.  Cardiovascular: Normal rate, regular rhythm, normal heart sounds and intact distal pulses.   Pulmonary/Chest: Effort normal. She has no wheezes. She has rales.       Rales left base  Abdominal: Soft. Bowel sounds are normal. She exhibits no distension. There is no  tenderness. There is no rebound and no guarding.  Musculoskeletal: Normal range of motion. She exhibits no edema and no tenderness.  Lymphadenopathy:    She has no cervical adenopathy.  Neurological: She is alert and oriented to person, place, and time. She displays normal reflexes. No cranial nerve deficit. She exhibits normal muscle tone. Coordination normal.  Skin: Skin is warm and dry. No rash noted.  Psychiatric: She has a normal mood and affect. Her behavior is normal.    ED Course  Procedures (including critical care time)   Date: 06/25/2012  Rate: 90  Rhythm: atrial fibrillation  QRS Axis: normal  Intervals: QRS widnened 126 ms  ST/T Wave abnormalities: nonspecific ST changes  Conduction Disutrbances:right bundle branch block  Narrative Interpretation:   Old EKG Reviewed: unchanged from January 11, 2012    Labs Reviewed  GLUCOSE, CAPILLARY - Abnormal; Notable for the following:    Glucose-Capillary 265 (*)     All other components within normal limits  CBC WITH DIFFERENTIAL - Abnormal; Notable for the following:    RBC 3.28 (*)     Hemoglobin 9.6 (*)     HCT 29.7 (*)     Platelets 145 (*)     All other components within normal limits  COMPREHENSIVE METABOLIC PANEL - Abnormal; Notable for the following:    Sodium 132 (*)     Chloride 95 (*)     Glucose, Bld 283 (*)     BUN 28 (*)     Creatinine, Ser 1.63 (*)     Albumin 2.8 (*)     Total Bilirubin 0.2 (*)     GFR calc non Af Amer 31 (*)     GFR calc Af Amer 36 (*)     All other components within normal limits  PRO B NATRIURETIC PEPTIDE - Abnormal; Notable for the following:    Pro B Natriuretic peptide (BNP) 4638.0 (*)     All other components within normal limits  CBC - Abnormal; Notable for the following:    RBC 3.20 (*)     Hemoglobin 9.4 (*)     HCT 29.0 (*)     Platelets 137 (*)     All other components within normal limits  CREATININE, SERUM - Abnormal; Notable for the following:    Creatinine, Ser  1.59 (*)     GFR calc non Af Amer 32 (*)     GFR calc Af Amer 37 (*)     All other components within normal limits  GLUCOSE, CAPILLARY - Abnormal; Notable for the following:    Glucose-Capillary 354 (*)     All other components within normal limits  POCT I-STAT TROPONIN I  INFLUENZA PANEL BY PCR  BASIC METABOLIC PANEL  CBC  LEGIONELLA ANTIGEN, URINE  STREP PNEUMONIAE URINARY ANTIGEN  CLOSTRIDIUM DIFFICILE BY PCR   Dg Chest 2 View  06/24/2012  *RADIOLOGY REPORT*  Clinical Data: Chest pain and shortness of breath.  CHEST - 2  VIEW  Comparison: PA and lateral chest 01/11/2012 and 05/03/2011.  Findings: There is mild cardiomegaly.  The patient has focal left lower lobe airspace disease which is new since the prior examinations.  No pneumothorax or pleural fluid. Thoracolumbar scoliosis is identified.  IMPRESSION:  1.  Focal left lower lobe airspace disease worrisome for pneumonia. Recommend follow-up plain films to clearing. 2.  Cardiomegaly.   Original Report Authenticated By: Holley Dexter, M.D.      1. CAP (community acquired pneumonia)   2. Acute renal failure   3. Anemia   4. Diabetes mellitus       MDM  35F with pmhx of DM, HTN, HLD, CAD here with cough, congestion, fevers, and generalized weakness x 1 day. CXR concerning for pneumonia LLL. Labs reveal acute renal failure. Vitals are stable. Rectal temp 102.9. Given Tylenol and IVF. Remains normotensive. 02 sat >95% while in ED. Mildly tachypneic but no distress. Given Rocephin and Azithromycin for CAP. Admitted to hospitalist for further management.        Johnnette Gourd, MD 06/25/12 [id number]

## 2012-06-24 NOTE — H&P (Signed)
[name] is an 72 y.o. female. Patient was seen and examined on June 24, 2012. PCP - Dr. Lysbeth Galas.   Chief Complaint: Productive cough with fever and chills. HPI: 72 year-old female with history of diabetes mellitus type 2, hypertension, chronic kidney disease, chronic anemia and history of CAD managed medically presents with complaints of having shortness of breath with productive cough over the last 2-3 days. patient has been having in addition subjective feeling of fever chills. At her home others were also sick with symptoms of upper respiratory tract infection. Chest x-ray in the ER shows features consistent with pneumonia. Patient also was found to be febrile. At this time patient has been started on empiric antibiotics for community-acquired pneumonia. Influenza panel is pending. In addition patient has experienced some diarrhea last 2 days. Denies having used any recent antibiotics. Denies any abdominal pain nausea vomiting.  Past Medical History  Diagnosis Date  . Diabetes mellitus   . Hypertension   . Hyperlipemia   . Fibromyalgia   . Obesity   . Sleep apnea     No CPAP  . Anxiety   . Hiatal hernia   . NSTEMI (non-ST elevated myocardial infarction) 04/27/2011  . Anemia 04/27/2011  . Restless leg syndrome   . CAD (coronary artery disease)     small dominant right coronary artery with diffuse 95% stenosis. The LAD had 70% stenosis in the mid vessel beyond the diagonal branch but was a small vessel. Circumflex had proximal 30% stenosis.   . Renal insufficiency 05/06/2011  . External hemorrhoids   . Arrhythmia 10/11/2011  . Community acquired pneumonia 06/24/2012    Past Surgical History  Procedure Date  . Appendectomy   . Fracture surgery     left humerous  . Ganglion cyst removal     Family History  Problem Relation Age of Onset  . Stomach cancer Mother   . Heart disease Maternal Grandmother   . Heart disease Maternal Grandfather    Social History:  reports that she  has never smoked. She has never used smokeless tobacco. She reports that she does not drink alcohol or use illicit drugs.  Allergies:  Allergies  Allergen Reactions  . Codeine Other (See Comments)    Childhood allergy     (Not in a hospital admission)  Results for orders placed during the hospital encounter of 06/24/12 (from the past 48 hour(s))  GLUCOSE, CAPILLARY     Status: Abnormal   Collection Time   06/24/12  3:19 PM      Component Value Range Comment   Glucose-Capillary 265 (*) 70 - 99 mg/dL    Comment 1 Documented in Chart      Comment 2 Notify RN     CBC WITH DIFFERENTIAL     Status: Abnormal   Collection Time   06/24/12  4:45 PM      Component Value Range Comment   WBC 8.8  4.0 - 10.5 K/uL    RBC 3.28 (*) 3.87 - 5.11 MIL/uL    Hemoglobin 9.6 (*) 12.0 - 15.0 g/dL    HCT 16.1 (*) 09.6 - 46.0 %    MCV 90.5  78.0 - 100.0 fL    MCH 29.3  26.0 - 34.0 pg    MCHC 32.3  30.0 - 36.0 g/dL    RDW 04.5  40.9 - 81.1 %    Platelets 145 (*) 150 - 400 K/uL    Neutrophils Relative 75  43 - 77 %  Neutro Abs 6.6  1.7 - 7.7 K/uL    Lymphocytes Relative 19  12 - 46 %    Lymphs Abs 1.6  0.7 - 4.0 K/uL    Monocytes Relative 6  3 - 12 %    Monocytes Absolute 0.5  0.1 - 1.0 K/uL    Eosinophils Relative 0  0 - 5 %    Eosinophils Absolute 0.0  0.0 - 0.7 K/uL    Basophils Relative 0  0 - 1 %    Basophils Absolute 0.0  0.0 - 0.1 K/uL   COMPREHENSIVE METABOLIC PANEL     Status: Abnormal   Collection Time   06/24/12  4:45 PM      Component Value Range Comment   Sodium 132 (*) 135 - 145 mEq/L    Potassium 3.7  3.5 - 5.1 mEq/L    Chloride 95 (*) 96 - 112 mEq/L    CO2 25  19 - 32 mEq/L    Glucose, Bld 283 (*) 70 - 99 mg/dL    BUN 28 (*) 6 - 23 mg/dL    Creatinine, Ser 1.19 (*) 0.50 - 1.10 mg/dL    Calcium 8.6  8.4 - 14.7 mg/dL    Total Protein 6.6  6.0 - 8.3 g/dL    Albumin 2.8 (*) 3.5 - 5.2 g/dL    AST 14  0 - 37 U/L    ALT 10  0 - 35 U/L    Alkaline Phosphatase 64  39 - 117 U/L     Total Bilirubin 0.2 (*) 0.3 - 1.2 mg/dL    GFR calc non Af Amer 31 (*) >90 mL/min    GFR calc Af Amer 36 (*) >90 mL/min   PRO B NATRIURETIC PEPTIDE     Status: Abnormal   Collection Time   06/24/12  5:40 PM      Component Value Range Comment   Pro B Natriuretic peptide (BNP) 4638.0 (*) 0 - 125 pg/mL   POCT I-STAT TROPONIN I     Status: Normal   Collection Time   06/24/12  6:16 PM      Component Value Range Comment   Troponin i, poc 0.03  0.00 - 0.08 ng/mL    Comment 3             Dg Chest 2 View  06/24/2012  *RADIOLOGY REPORT*  Clinical Data: Chest pain and shortness of breath.  CHEST - 2 VIEW  Comparison: PA and lateral chest 01/11/2012 and 05/03/2011.  Findings: There is mild cardiomegaly.  The patient has focal left lower lobe airspace disease which is new since the prior examinations.  No pneumothorax or pleural fluid. Thoracolumbar scoliosis is identified.  IMPRESSION:  1.  Focal left lower lobe airspace disease worrisome for pneumonia. Recommend follow-up plain films to clearing. 2.  Cardiomegaly.   Original Report Authenticated By: Holley Dexter, M.D.     Review of Systems  Constitutional: Positive for fever and chills.  HENT: Negative.   Eyes: Negative.   Respiratory: Positive for cough and sputum production.   Cardiovascular: Negative.   Gastrointestinal: Negative.   Genitourinary: Negative.   Musculoskeletal: Negative.   Skin: Negative.   Neurological: Negative.   Endo/Heme/Allergies: Negative.   Psychiatric/Behavioral: Negative.     Blood pressure 114/77, pulse 88, temperature 102.9 F (39.4 C), temperature source Rectal, resp. rate 22, SpO2 94.00%. Physical Exam  Constitutional: She is oriented to person, place, and time. She appears well-developed and well-nourished. No distress.  HENT:  Head: Normocephalic and atraumatic.  Right Ear: External ear normal.  Left Ear: External ear normal.  Nose: Nose normal.  Mouth/Throat: Oropharynx is clear and moist. No  oropharyngeal exudate.  Eyes: Conjunctivae normal are normal. Pupils are equal, round, and reactive to light. Right eye exhibits no discharge. Left eye exhibits no discharge. No scleral icterus.  Neck: Normal range of motion. Neck supple.  Cardiovascular: Normal rate and regular rhythm.   Respiratory: Effort normal and breath sounds normal. No respiratory distress. She has no wheezes. She has no rales.  GI: Soft. Bowel sounds are normal. She exhibits no distension. There is no tenderness. There is no rebound.  Musculoskeletal: She exhibits no edema and no tenderness.  Neurological: She is alert and oriented to person, place, and time.       Moves all extremities.  Skin: Skin is warm and dry. She is not diaphoretic.     Assessment/Plan #1. Community acquired pneumonia - continue with IV antibiotics. Influenza panel is pending. Since patient has diarrhea which could be from the pneumonic process we'll check urine for Legionella. Also check for C. difficile colitis which could be less likely. #2. Acute renal failure on chronic kidney disease - check UA. Closely follow intake output and metabolic panel. Any further worsening of creatinine may have to hold off lisinopril. Patient did receive some fluids in the ER and we will hold off further IV fluids given the patient's BNP is elevated. #3. Diabetes mellitus type 2 - continue with home medications except for metformin. Sliding-scale coverage. #4. Chronic anemia probably from chronic kidney disease - closely follow CBC anemia appears at baseline. #5. CAD medically managed - denies any chest pain at this time. Troponins were negative.  CODE STATUS - full code.  Jamar Weatherall N. 06/24/2012, 9:30 PM

## 2012-06-25 LAB — GLUCOSE, CAPILLARY
Glucose-Capillary: 207 mg/dL — ABNORMAL HIGH (ref 70–99)
Glucose-Capillary: 189 mg/dL — ABNORMAL HIGH (ref 70–99)
Glucose-Capillary: 230 mg/dL — ABNORMAL HIGH (ref 70–99)
Glucose-Capillary: 206 mg/dL — ABNORMAL HIGH (ref 70–99)

## 2012-06-25 LAB — CBC
HCT: 29.2 % — ABNORMAL LOW (ref 36.0–46.0)
Hemoglobin: 9.5 g/dL — ABNORMAL LOW (ref 12.0–15.0)
MCH: 29.1 pg (ref 26.0–34.0)
MCHC: 32.5 g/dL (ref 30.0–36.0)
RDW: 13.3 % (ref 11.5–15.5)

## 2012-06-25 LAB — INFLUENZA PANEL BY PCR (TYPE A & B)
H1N1 flu by pcr: NOT DETECTED
Influenza A By PCR: NEGATIVE
Influenza B By PCR: NEGATIVE

## 2012-06-25 LAB — LEGIONELLA ANTIGEN, URINE

## 2012-06-25 LAB — BASIC METABOLIC PANEL
BUN: 27 mg/dL — ABNORMAL HIGH (ref 6–23)
Creatinine, Ser: 1.72 mg/dL — ABNORMAL HIGH (ref 0.50–1.10)
GFR calc Af Amer: 34 mL/min — ABNORMAL LOW (ref >90)
GFR calc non Af Amer: 29 mL/min — ABNORMAL LOW (ref >90)
Glucose, Bld: 262 mg/dL — ABNORMAL HIGH (ref 70–99)

## 2012-06-25 LAB — STREP PNEUMONIAE URINARY ANTIGEN: Strep Pneumo Urinary Antigen: NEGATIVE

## 2012-06-25 MED ORDER — GUAIFENESIN-DM 100-10 MG/5ML PO SYRP
5.0000 mL | ORAL_SOLUTION | ORAL | Status: DC | PRN
  Administered 2012-06-26: 5 mL via ORAL
  Filled 2012-06-25 (×3): qty 5

## 2012-06-25 NOTE — Progress Notes (Signed)
INITIAL NUTRITION ASSESSMENT  DOCUMENTATION CODES Per approved criteria  -Morbid Obesity   INTERVENTION: 1. Recommend Glucerna Shake daily once no longer NPO, each supplement provides 220 kcal and 10 grams of protein. 2. RD to continue to follow nutrition care plan  NUTRITION DIAGNOSIS: Increased nutrient needs related to PNA as evidenced by estimated needs.   Goal: Diet advancement to meet >/= 90% of their estimated nutrition needs.  Monitor:  weight trends, lab trends, I/O's, supplement tolerance, diet advancement  Reason for Assessment: Malnutrition Screening  72 y.o. female  Admitting Dx: Community acquired pneumonia  ASSESSMENT: Admitted with diarrhea, cough, fever and chills x 2-3 days. Pt currently afebrile. Work-up reveals PNA. Influenza panel pending. Also with acute on chronic kidney disease. From home with family.  Pt is currently NPO 2/2 elevated blood sugars per RN. Pt states that she is hungry and ready to eat. Confirms slight weight loss, pt is down approximately 4 lb x 5 months, this is not significant.   Pt states that she does not follow any diet restrictions PTA. She notes that she cannot afford to eat healthy.  Height: Ht Readings from Last 1 Encounters:  06/24/12 5\' 3"  (1.6 m)    Weight: Wt Readings from Last 1 Encounters:  06/24/12 232 lb 2.3 oz (105.3 kg)    Ideal Body Weight: 115 lb  % Ideal Body Weight: 202%  Wt Readings from Last 10 Encounters:  06/24/12 232 lb 2.3 oz (105.3 kg)  01/31/12 236 lb (107.049 kg)  10/11/11 241 lb (109.317 kg)  07/19/11 241 lb (109.317 kg)  07/18/11 241 lb 12.8 oz (109.68 kg)  05/17/11 228 lb (103.42 kg)  05/06/11 236 lb 8 oz (107.276 kg)  05/06/11 236 lb 8 oz (107.276 kg)  05/06/11 236 lb 8 oz (107.276 kg)    Usual Body Weight: 236 - 241 lb  % Usual Body Weight: 97%  BMI:  Body mass index is 41.12 kg/(m^2). Obese Class III  Estimated Nutritional Needs: Kcal: 1650 - 1750 kcal Protein: 75 - 85  grams Fluid: 1.6 - 1.8 liters daily  Skin: skin intact  Diet Order: NPO  EDUCATION NEEDS: -No education needs identified at this time   Intake/Output Summary (Last 24 hours) at 06/25/12 0955 Last data filed at 06/25/12 0523  Gross per 24 hour  Intake      0 ml  Output    500 ml  Net   -500 ml    Last BM: 1/19  Labs:   Lab 06/25/12 0525 06/24/12 2224 06/24/12 1645  NA 134* -- 132*  K 3.4* -- 3.7  CL 96 -- 95*  CO2 28 -- 25  BUN 27* -- 28*  CREATININE 1.72* 1.59* 1.63*  CALCIUM 8.6 -- 8.6  MG -- -- --  PHOS -- -- --  GLUCOSE 262* -- 283*    CBG (last 3)   Basename 06/25/12 0729 06/24/12 2207 06/24/12 1519  GLUCAP 207* 354* 265*    Scheduled Meds:   . aspirin EC  81 mg Oral Daily  . atorvastatin  40 mg Oral q1800  . azithromycin  500 mg Intravenous Q24H  . carvedilol  6.25 mg Oral BID WC  . cefTRIAXone (ROCEPHIN)  IV  1 g Intravenous Q24H  . docusate sodium  100 mg Oral Daily  . enoxaparin (LOVENOX) injection  40 mg Subcutaneous Q24H  . ferrous sulfate  325 mg Oral Q breakfast  . glimepiride  4 mg Oral BID  . insulin aspart  0-9 Units  Subcutaneous TID WC  . isosorbide mononitrate  30 mg Oral Daily  . linagliptin  5 mg Oral Daily  . lisinopril  20 mg Oral q morning - 10a  . pantoprazole  40 mg Oral Daily  . pregabalin  50 mg Oral BID  . sodium chloride  3 mL Intravenous Q12H  . sodium chloride  3 mL Intravenous Q12H  . vitamin B-12  1,000 mcg Oral Daily    Continuous Infusions:   Past Medical History  Diagnosis Date  . Diabetes mellitus   . Hypertension   . Hyperlipemia   . Fibromyalgia   . Obesity   . Sleep apnea     No CPAP  . Anxiety   . Hiatal hernia   . NSTEMI (non-ST elevated myocardial infarction) 04/27/2011  . Anemia 04/27/2011  . Restless leg syndrome   . CAD (coronary artery disease)     small dominant right coronary artery with diffuse 95% stenosis. The LAD had 70% stenosis in the mid vessel beyond the diagonal branch but was a  small vessel. Circumflex had proximal 30% stenosis.   . Renal insufficiency 05/06/2011  . External hemorrhoids   . Arrhythmia 10/11/2011  . Community acquired pneumonia 06/24/2012    Past Surgical History  Procedure Date  . Appendectomy   . Fracture surgery     left humerous  . Ganglion cyst removal     Jarold Motto MS, RD, LDN Pager: [phone] After-hours pager: [phone]

## 2012-06-25 NOTE — ED Provider Notes (Signed)
I saw and evaluated the patient, reviewed the resident's note and I agree with the findings and plan. I have reviewed EKG and agree with the resident interpretation.  you Patient with a history of fever, productive cough and general myalgias. Labs show persistent unchanged anemia and a chest x-ray with a new lobar pneumonia. Patient treated for community-acquired pneumonia and admitted  Gwyneth Sprout, MD 06/25/12 2328

## 2012-06-25 NOTE — Progress Notes (Signed)
Utilization review completed.  

## 2012-06-25 NOTE — Progress Notes (Signed)
Pt transferred from the ED around 230 on 06/24/12, admitted to Rm 6734. Pt comes from home with family. She is alert and oriented. Ambulatory with standby assist and walker, states she fell at home a few weeks ago. No skin breakdown noted. Placed on telemetry, running Afib. Oriented to room, instructed to call for assistance before getting out of bed. Resting comfortably at this time with call bell in reach. Bed alarm on, will continue to monitor

## 2012-06-25 NOTE — Progress Notes (Signed)
[facility] PROGRESS NOTE  [name] NWG:[id number] DOB: 05-06-1941 DOA: 06/24/2012 PCP: Brenda Hector, MD  Assessment/Plan: #1. Community acquired pneumonia - patient started on IV antibiotics on admission. Influenza panel negative and patient is afebrile.  Since patient has diarrhea which could be from the pneumonic process we'll check urine for Legionella. Also check for C. difficile colitis which is less likely.  #2. Acute renal failure on chronic kidney disease -  Closely follow intake output and metabolic panel. Stopped lisinopril on 06/25/12.  #3. Diabetes mellitus type 2 - continue with home medications except for metformin. Sliding-scale coverage.  #4. Chronic anemia probably from chronic kidney disease - closely follow CBC anemia appears at baseline.  #5. CAD medically managed - denies any chest pain at this time. Troponins were negative.      Code Status: full Family Communication: patient  Disposition Plan: ?    Consultants:  none  Procedures:  none  Antibiotics: Rocephin  Zithromax  HPI/Subjective: Still with cough and dyspnea   Objective: Filed Vitals:   06/24/12 2100 06/24/12 2115 06/24/12 2214 06/25/12 0511  BP: 114/77 105/49 139/70 123/52  Pulse: 88 87 95 79  Temp:   97.5 F (36.4 C) 98.5 F (36.9 C)  TempSrc:   Oral Oral  Resp: 22 25 25 22   Height:   5\' 3"  (1.6 m)   Weight:   105.3 kg (232 lb 2.3 oz)   SpO2: 94% 94% 93% 95%    Intake/Output Summary (Last 24 hours) at 06/25/12 1005 Last data filed at 06/25/12 0523  Gross per 24 hour  Intake      0 ml  Output    500 ml  Net   -500 ml   Filed Weights   06/24/12 2214  Weight: 105.3 kg (232 lb 2.3 oz)    Exam:   General:  axox3  Cardiovascular: rrr  Respiratory: ctab, minimal rhonchi  Abdomen: soft, nt   Data Reviewed: Basic Metabolic Panel:  Lab 06/25/12 5784 06/24/12 2224 06/24/12 1645  NA 134* -- 132*  K 3.4* -- 3.7  CL 96 -- 95*  CO2 28 -- 25  GLUCOSE 262*  -- 283*  BUN 27* -- 28*  CREATININE 1.72* 1.59* 1.63*  CALCIUM 8.6 -- 8.6  MG -- -- --  PHOS -- -- --   Liver Function Tests:  Lab 06/24/12 1645  AST 14  ALT 10  ALKPHOS 64  BILITOT 0.2*  PROT 6.6  ALBUMIN 2.8*   No results found for this basename: LIPASE:5,AMYLASE:5 in the last 168 hours No results found for this basename: AMMONIA:5 in the last 168 hours CBC:  Lab 06/25/12 0525 06/24/12 2224 06/24/12 1645  WBC 7.3 8.4 8.8  NEUTROABS -- -- 6.6  HGB 9.5* 9.4* 9.6*  HCT 29.2* 29.0* 29.7*  MCV 89.6 90.6 90.5  PLT 136* 137* 145*   Cardiac Enzymes: No results found for this basename: CKTOTAL:5,CKMB:5,CKMBINDEX:5,TROPONINI:5 in the last 168 hours BNP (last 3 results)  Basename 06/24/12 1740  PROBNP 4638.0*   CBG:  Lab 06/25/12 0729 06/24/12 2207 06/24/12 1519  GLUCAP 207* 354* 265*    No results found for this or any previous visit (from the past 240 hour(s)).   Studies: Dg Chest 2 View  06/24/2012  *RADIOLOGY REPORT*  Clinical Data: Chest pain and shortness of breath.  CHEST - 2 VIEW  Comparison: PA and lateral chest 01/11/2012 and 05/03/2011.  Findings: There is mild cardiomegaly.  The patient has focal left lower lobe airspace disease  which is new since the prior examinations.  No pneumothorax or pleural fluid. Thoracolumbar scoliosis is identified.  IMPRESSION:  1.  Focal left lower lobe airspace disease worrisome for pneumonia. Recommend follow-up plain films to clearing. 2.  Cardiomegaly.   Original Report Authenticated By: [name], M.D.     Scheduled Meds:   . aspirin EC  81 mg Oral Daily  . atorvastatin  40 mg Oral q1800  . azithromycin  500 mg Intravenous Q24H  . carvedilol  6.25 mg Oral BID WC  . cefTRIAXone (ROCEPHIN)  IV  1 g Intravenous Q24H  . docusate sodium  100 mg Oral Daily  . enoxaparin (LOVENOX) injection  40 mg Subcutaneous Q24H  . ferrous sulfate  325 mg Oral Q breakfast  . glimepiride  4 mg Oral BID  . insulin aspart  0-9 Units  Subcutaneous TID WC  . isosorbide mononitrate  30 mg Oral Daily  . linagliptin  5 mg Oral Daily  . lisinopril  20 mg Oral q morning - 10a  . pantoprazole  40 mg Oral Daily  . pregabalin  50 mg Oral BID  . sodium chloride  3 mL Intravenous Q12H  . sodium chloride  3 mL Intravenous Q12H  . vitamin B-12  1,000 mcg Oral Daily   Continuous Infusions:   Principal Problem:  *Community acquired pneumonia Active Problems:  Diabetes mellitus  Anemia  ARF (acute renal failure)     [name]  [facility] Pager [phone]. If 8PM-8AM, please contact night-coverage at [email or web address], password [facility] 06/25/2012, 10:05 AM  LOS: 1 day

## 2012-06-26 LAB — COMPREHENSIVE METABOLIC PANEL
AST: 18 U/L (ref 0–37)
CO2: 24 mEq/L (ref 19–32)
Calcium: 8.5 mg/dL (ref 8.4–10.5)
Creatinine, Ser: 1.95 mg/dL — ABNORMAL HIGH (ref 0.50–1.10)
GFR calc non Af Amer: 25 mL/min — ABNORMAL LOW (ref >90)

## 2012-06-26 LAB — GLUCOSE, CAPILLARY
Glucose-Capillary: 217 mg/dL — ABNORMAL HIGH (ref 70–99)
Glucose-Capillary: 178 mg/dL — ABNORMAL HIGH (ref 70–99)

## 2012-06-26 MED ORDER — AZITHROMYCIN 500 MG PO TABS
500.0000 mg | ORAL_TABLET | Freq: Every day | ORAL | Status: DC
  Administered 2012-06-26 – 2012-06-29 (×4): 500 mg via ORAL
  Filled 2012-06-26 (×4): qty 1

## 2012-06-26 MED ORDER — SODIUM CHLORIDE 0.9 % IV SOLN
INTRAVENOUS | Status: DC
  Administered 2012-06-26 – 2012-06-28 (×2): via INTRAVENOUS
  Administered 2012-06-28: 100 mL/h via INTRAVENOUS
  Administered 2012-06-29: 11:00:00 via INTRAVENOUS

## 2012-06-26 MED ORDER — POTASSIUM CHLORIDE IN NACL 20-0.9 MEQ/L-% IV SOLN
INTRAVENOUS | Status: DC
  Administered 2012-06-26: 09:00:00 via INTRAVENOUS
  Filled 2012-06-26 (×2): qty 1000

## 2012-06-26 NOTE — Progress Notes (Addendum)
[facility] PROGRESS NOTE  [name] WJX:[id number] DOB: 26-May-1941 DOA: 06/24/2012 PCP: Josue Hector, MD  Assessment/Plan: Principal Problem:  *Community acquired pneumonia Active Problems:  Diabetes mellitus  Anemia  ARF (acute renal failure)    #1. Community acquired pneumonia - patient started on IV antibiotics on admission. Influenza panel negative and patient is afebrile. Since patient has diarrhea which could be from the pneumonic process we'll check urine for Legionella. Also check for C. difficile colitis which is less likely.  #2. Acute renal failure on chronic kidney disease - Closely follow intake output and metabolic panel. Stopped lisinopril on 06/25/12. Started on normal saline his creatinine trending up #3. Diabetes mellitus type 2 - continue with home medications except for metformin. Sliding-scale coverage.  #4. Chronic anemia probably from chronic kidney disease - closely follow CBC anemia appears at baseline.  #5. CAD medically managed - denies any chest pain at this time. Troponins were negative.       Code Status: full Family Communication: family updated about patient's clinical progress and discussed with the patient's daughter [name] [phone], [phone] Disposition Plan:  As above   \  Consultants:  none Procedures:  none Antibiotics:  Rocephin  Zithromax  HPI/Subjective:  Still with cough and dyspnea , diarrhea improved  Objective: Filed Vitals:   06/25/12 1439 06/25/12 1713 06/25/12 2137 06/26/12 0545  BP: 118/63 113/61 104/53 134/71  Pulse: 78 50 61 79  Temp: 99 F (37.2 C) 98.8 F (37.1 C) 98.6 F (37 C) 98.7 F (37.1 C)  TempSrc:   Oral Oral  Resp: 19 17 17 17   Height:      Weight:   105 kg (231 lb 7.7 oz)   SpO2: 94% 94% 93% 92%    Intake/Output Summary (Last 24 hours) at 06/26/12 0757 Last data filed at 06/26/12 0546  Gross per 24 hour  Intake    240 ml  Output    650 ml  Net   -410 ml    Exam:  HENT:    Head: Atraumatic.  Nose: Nose normal.  Mouth/Throat: Oropharynx is clear and moist.  Eyes: Conjunctivae are normal. Pupils are equal, round, and reactive to light. No scleral icterus.  Neck: Neck supple. No tracheal deviation present.  Cardiovascular: Normal rate, regular rhythm, normal heart sounds and intact distal pulses.  Pulmonary/Chest: Effort normal and breath sounds normal. No respiratory distress.  Abdominal: Soft. Normal appearance and bowel sounds are normal. She exhibits no distension. There is no tenderness.  Musculoskeletal: She exhibits no edema and no tenderness.  Neurological: She is alert. No cranial nerve deficit.    Data Reviewed: Basic Metabolic Panel:  Lab 06/25/12 9528 06/24/12 2224 06/24/12 1645  NA 134* -- 132*  K 3.4* -- 3.7  CL 96 -- 95*  CO2 28 -- 25  GLUCOSE 262* -- 283*  BUN 27* -- 28*  CREATININE 1.72* 1.59* 1.63*  CALCIUM 8.6 -- 8.6  MG -- -- --  PHOS -- -- --    Liver Function Tests:  Lab 06/24/12 1645  AST 14  ALT 10  ALKPHOS 64  BILITOT 0.2*  PROT 6.6  ALBUMIN 2.8*   No results found for this basename: LIPASE:5,AMYLASE:5 in the last 168 hours No results found for this basename: AMMONIA:5 in the last 168 hours  CBC:  Lab 06/25/12 0525 06/24/12 2224 06/24/12 1645  WBC 7.3 8.4 8.8  NEUTROABS -- -- 6.6  HGB 9.5* 9.4* 9.6*  HCT 29.2* 29.0* 29.7*  MCV 89.6 90.6  90.5  PLT 136* 137* 145*    Cardiac Enzymes: No results found for this basename: CKTOTAL:5,CKMB:5,CKMBINDEX:5,TROPONINI:5 in the last 168 hours BNP (last 3 results)  Basename 06/24/12 1740  PROBNP 4638.0*     CBG:  Lab 06/26/12 0720 06/25/12 2121 06/25/12 1711 06/25/12 1154 06/25/12 0729  GLUCAP 217* 206* 230* 189* 207*    No results found for this or any previous visit (from the past 240 hour(s)).   Studies: Dg Chest 2 View  06/24/2012  *RADIOLOGY REPORT*  Clinical Data: Chest pain and shortness of breath.  CHEST - 2 VIEW  Comparison: PA and lateral chest  01/11/2012 and 05/03/2011.  Findings: There is mild cardiomegaly.  The patient has focal left lower lobe airspace disease which is new since the prior examinations.  No pneumothorax or pleural fluid. Thoracolumbar scoliosis is identified.  IMPRESSION:  1.  Focal left lower lobe airspace disease worrisome for pneumonia. Recommend follow-up plain films to clearing. 2.  Cardiomegaly.   Original Report Authenticated By: Holley Dexter, M.D.     Scheduled Meds:   . aspirin EC  81 mg Oral Daily  . atorvastatin  40 mg Oral q1800  . azithromycin  500 mg Intravenous Q24H  . carvedilol  6.25 mg Oral BID WC  . cefTRIAXone (ROCEPHIN)  IV  1 g Intravenous Q24H  . docusate sodium  100 mg Oral Daily  . enoxaparin (LOVENOX) injection  40 mg Subcutaneous Q24H  . ferrous sulfate  325 mg Oral Q breakfast  . insulin aspart  0-9 Units Subcutaneous TID WC  . isosorbide mononitrate  30 mg Oral Daily  . linagliptin  5 mg Oral Daily  . pantoprazole  40 mg Oral Daily  . pregabalin  50 mg Oral BID  . sodium chloride  3 mL Intravenous Q12H  . sodium chloride  3 mL Intravenous Q12H  . vitamin B-12  1,000 mcg Oral Daily   Continuous Infusions:   . 0.9 % NaCl with KCl 20 mEq / L      Principal Problem:  *Community acquired pneumonia Active Problems:  Diabetes mellitus  Anemia  ARF (acute renal failure)    Time spent: 40 minutes   [facility]  [facility] Pager [phone]. If 8PM-8AM, please contact night-coverage at [email or web address], password [facility] 06/26/2012, 7:57 AM  LOS: 2 days

## 2012-06-26 NOTE — Progress Notes (Signed)
Physical Therapy Note  See also full PT eval  SATURATION QUALIFICATIONS: (This note is used to comply with regulatory documentation for home oxygen)  Patient Saturations on Room Air at Rest = 97-99%  Patient Saturations on Room Air while Ambulating = 85% (lowest observed) Episodes of pt's O2 sats dropping were in conjunction with pt talking during activity  Patient Saturations on Room Air with seated rest post Ambulating = 92%  Please briefly explain why patient needs home oxygen: Pt's O2 sats are tending to decr to unacceptable levels with activity on Room Air  Hopefully as pt's medical status improves, so will her ability to keep O2 sats up with activity on room air;  Will continue to monitor  Thanks, Van Clines, [place] [phone]

## 2012-06-26 NOTE — Progress Notes (Signed)
PHARMACIST - PHYSICIAN COMMUNICATION DR:   Susie Cassette CONCERNING: Antibiotic IV to Oral Route Change Policy  RECOMMENDATION: This patient is receiving Azithromycin by the intravenous route.  Based on criteria approved by the Pharmacy and Therapeutics Committee, the antibiotic(s) is/are being converted to the equivalent oral dose form(s).   DESCRIPTION: These criteria include:  Patient being treated for a respiratory tract infection, urinary tract infection, or cellulitis  The patient is not neutropenic and does not exhibit a GI malabsorption state  The patient is eating (either orally or via tube) and/or has been taking other orally administered medications for a least 24 hours  The patient is improving clinically and has a Tmax < 100.5  If you have questions about this conversion, please contact the Pharmacy Department  []   [phone] )  [facility] [x]   [phone] )  [facility]  []   [phone] )  [facility] []   [phone] )  [facility]     Georgina Pillion, PharmD, BCPS Clinical Pharmacist 06/26/2012 3:20 PM

## 2012-06-26 NOTE — Progress Notes (Signed)
Physical Therapy Evaluation Patient Details Name: CALLIE FACEY MRN: [id number] DOB: [birth year] Today's Date: 06/26/2012 Time: 4098-1191 PT Time Calculation (min): 30 min  PT Assessment / Plan / Recommendation Clinical Impression  72 yo female admitted with SOB, CAP; Presents to PT with decr mobility, decr activity tolerance, tendency to desat with activity on Room Air; Will benefit from PT to maximize activity tolerance, and enable safe dc home    PT Assessment  Patient needs continued PT services    Follow Up Recommendations  Home health PT    Does the patient have the potential to tolerate intense rehabilitation      Barriers to Discharge Decreased caregiver support must be mod I as sons work During the day    Engineer, agricultural with 5" wheels (Pt may already have one)    Recommendations for Other Services OT consult   Frequency Min 3X/week    Precautions / Restrictions Precautions Precautions: Other (comment);Fall (watch O2 sats with amb) Precaution Comments: Fall risk is greatly minimized with use of RW   Pertinent Vitals/Pain Desatted to as low as the  mid 80s% with activity on Room Air;  Back to 92% with seated rest      Mobility  Bed Mobility Bed Mobility: Supine to Sit;Sitting - Scoot to Edge of Bed Supine to Sit: 4: Min assist;HOB flat Sitting - Scoot to [place] of Bed: 4: Min guard Details for Bed Mobility Assistance: Assist to elevate trunk OOB.  Once sitting up, pt frequently rocking herself back into supine position while stating "I'm fine, I'm fine I'm fine"  Transfers Transfers: Sit to Stand;Stand to Sit Sit to Stand: 4: Min guard;From bed Stand to Sit: 4: Min guard;To chair/3-in-1 Details for Transfer Assistance: VC for safe hand placement. Pt attempting to stand with bil UE supported on RW.  Close guarding for safety. Ambulation/Gait Ambulation/Gait Assistance: 4: Min assist;4: Min Government social research officer (Feet): 80  Feet Assistive device: Rolling walker Ambulation/Gait Assistance Details: Occasional cues to keep RW close; Noted pt with episodic SOB during amb, typically concommitant with her talking, and noted O2 sats decr with talking as well; Cued pt for focused breathing Gait Pattern: Decreased stride length Gait velocity: slowed; Pt reports this is her typical gait speed    Shoulder Instructions     Exercises     PT Diagnosis: Difficulty walking  PT Problem List: Decreased activity tolerance;Decreased balance;Cardiopulmonary status limiting activity;Decreased safety awareness PT Treatment Interventions: DME instruction;Gait training;Stair training;Functional mobility training;Therapeutic activities;Therapeutic exercise;Patient/family education   PT Goals Acute Rehab PT Goals PT Goal Formulation: With patient Time For Goal Achievement: 06/26/12 Potential to Achieve Goals: Good Pt will go Supine/Side to Sit: Independently PT Goal: Supine/Side to Sit - Progress: Goal set today Pt will go Sit to Supine/Side: Independently PT Goal: Sit to Supine/Side - Progress: Goal set today Pt will go Sit to Stand: Independently PT Goal: Sit to Stand - Progress: Goal set today Pt will go Stand to Sit: Independently PT Goal: Stand to Sit - Progress: Goal set today Pt will Ambulate: >150 feet;with modified independence;with least restrictive assistive device PT Goal: Ambulate - Progress: Goal set today Pt will Go Up / Down Stairs: 1-2 stairs;with modified independence;with least restrictive assistive device PT Goal: Up/Down Stairs - Progress: Goal set today  Visit Information  Last PT Received On: 06/26/12 Assistance Needed: +1 PT/OT Co-Evaluation/Treatment: Yes    Subjective Data  Subjective: Reports feels better Patient Stated Goal: Home   Prior Functioning  Home Living Lives With: Son Available Help at Discharge: Family;Available PRN/intermittently Type of Home: House Home Access: Stairs to  enter [employer] of Steps: 2 Entrance Stairs-Rails: None Home Layout: One level Bathroom Shower/Tub: Health visitor: Standard Home Adaptive Equipment: Bedside commode/3-in-1;Walker - rolling Prior Function Level of Independence: Needs assistance Needs Assistance:  (occasionally) Driving: Yes Vocation: Retired Comments: Recent fall down stairs at home; Pt and sons share housekeeping responsibilities. Pt reports she drives her sons to work Musician: No difficulties    Cognition  Overall Cognitive Status: Appears within functional limits for tasks assessed/performed Arousal/Alertness: Awake/alert Orientation Level: Appears intact for tasks assessed Behavior During Session: [facility] for tasks performed    Extremity/Trunk Assessment Right Upper Extremity Assessment RUE ROM/Strength/Tone: [facility] for tasks assessed Left Upper Extremity Assessment LUE ROM/Strength/Tone: WFL for tasks assessed Right Lower Extremity Assessment RLE ROM/Strength/Tone: [facility] for tasks assessed (for simple mobility tasks) Left Lower Extremity Assessment LLE ROM/Strength/Tone: WFL for tasks assessed (for simple mobility tasks)   Balance Balance Balance Assessed: Yes Static Sitting Balance Static Sitting - Balance Support: Feet supported Static Sitting - Level of Assistance: 4: Min assist;5: Stand by assistance Static Sitting - Comment/# of Minutes: Cues to scoot fully to EOB as pt tended to lay back (almost perpendicular in bed) when she was sitting up, but not at EOB -- reported "I'm fine" when she was rocking back to semi-supine Static Standing Balance Static Standing - Balance Support: Bilateral upper extremity supported Static Standing - Level of Assistance: 5: Stand by assistance;4: Min assist Static Standing - Comment/# of Minutes: Close guard  End of Session PT - End of Session Equipment Utilized During Treatment: Gait belt Activity Tolerance: Patient tolerated  treatment well Patient left: in chair;with call bell/phone within reach Nurse Communication: Mobility status  GP     Olen Pel [place], [place] [phone]  06/26/2012, 1:32 PM

## 2012-06-26 NOTE — Evaluation (Signed)
Occupational Therapy Evaluation Patient Details Name: [name] MRN: [id number] DOB: [birth year] Today's Date: 06/26/2012 Time: 1478-2956 OT Time Calculation (min): 30 min  OT Assessment / Plan / Recommendation Clinical Impression  Pt admitted with CAP and demonstrating with productive cough and SOB.  Pt will benefit from continued OT services to address below problem list. Recommending HHOT to continue maximizing safety and independence with ADLs at home.  Pt lives with family (3 sons) but will need to be at mod I level to return home since family is at work during the day.     OT Assessment  Patient needs continued OT Services    Follow Up Recommendations  Home health OT    Barriers to Discharge Decreased caregiver support Pt is alone during day while sons are at work.  Equipment Recommendations  Tub/shower seat    Recommendations for Other Services    Frequency  Min 2X/week    Precautions / Restrictions Restrictions Weight Bearing Restrictions: No   Pertinent Vitals/Pain O2 sats on RA 98% at rest.  With activity on RA, O2 sats 85-93%.  RN aware.    ADL  Eating/Feeding: Performed;Independent Where Assessed - Eating/Feeding: Chair Upper Body Dressing: Performed;Set up Where Assessed - Upper Body Dressing: Unsupported sitting Lower Body Dressing: Performed;Supervision/safety Where Assessed - Lower Body Dressing: Unsupported sitting Toilet Transfer: Simulated;Min guard Toilet Transfer Method: Sit to Barista: Other (comment) (bed to ambulate in hallway then retrun to chair in room) Equipment Used: Gait belt;Rolling walker Transfers/Ambulation Related to ADLs: min guard with RW during ambulation in room and hallway. No LOB during ambulation but required several standing rest breaks due to decline in O2 sat reading (see vitals). ADL Comments: Pt able to don/doff bil socks with ankles crossed over knees while sitting EOB, however with some difficulty.   Requires increased time to complete tasks.      OT Diagnosis: Generalized weakness  OT Problem List: Decreased activity tolerance;Cardiopulmonary status limiting activity;Decreased knowledge of use of DME or AE;Obesity OT Treatment Interventions: Self-care/ADL training;DME and/or AE instruction;Energy conservation;Therapeutic activities;Patient/family education   OT Goals Acute Rehab OT Goals OT Goal Formulation: With patient Time For Goal Achievement: 07/03/12 Potential to Achieve Goals: Good ADL Goals Pt Will Perform Grooming: with modified independence;Standing at sink ADL Goal: Grooming - Progress: Goal set today Pt Will Perform Lower Body Bathing: with modified independence;Sit to stand from chair;Sit to stand from bed;with adaptive equipment ADL Goal: Lower Body Bathing - Progress: Goal set today Pt Will Perform Lower Body Dressing: with modified independence;Sit to stand from chair;Sit to stand from bed;with adaptive equipment ADL Goal: Lower Body Dressing - Progress: Goal set today Pt Will Transfer to Toilet: with modified independence;Ambulation;with DME;Comfort height toilet ADL Goal: Toilet Transfer - Progress: Goal set today Pt Will Perform Toileting - Clothing Manipulation: with modified independence;Standing ADL Goal: Toileting - Clothing Manipulation - Progress: Goal set today Pt Will Perform Toileting - Hygiene: with modified independence;Sit to stand from 3-in-1/toilet ADL Goal: Toileting - Hygiene - Progress: Goal set today Pt Will Perform Tub/Shower Transfer: Shower transfer;with modified independence;Ambulation;with DME;Shower seat with back ADL Goal: Web designer - Progress: Goal set today Miscellaneous OT Goals Miscellaneous OT Goal #1: Pt will perform bed mobility at mod I level with HOB flat as precursor for EOB ADLs. OT Goal: Miscellaneous Goal #1 - Progress: Goal set today Miscellaneous OT Goal #2: Pt will independently verbalize 3 energy conservation  techniques to be used during ADLs. OT Goal: Miscellaneous Goal #2 -  Progress: Goal set today  Visit Information  Last OT Received On: 06/26/12 Assistance Needed: +1 PT/OT Co-Evaluation/Treatment: Yes    Subjective Data      Prior Functioning     Home Living Lives With: Son Available Help at Discharge: Family;Available PRN/intermittently Type of Home: House Home Access: Stairs to enter [employer] of Steps: 2 Entrance Stairs-Rails: None Home Layout: One level Bathroom Shower/Tub: Health visitor: Standard Home Adaptive Equipment: Bedside commode/3-in-1;Walker - rolling (reports she had a shower chair but it recently broke) Prior Function Level of Independence: Needs assistance (needs occasional assist) Driving: Yes Vocation: Retired Comments: Recent fall down front steps within past month, but no other falls reported. Pt and sons share housekeeping responsibilities.  Pt reports she drives her sons to work.  Communication Communication: No difficulties         Vision/Perception     Cognition  Overall Cognitive Status: Appears within functional limits for tasks assessed/performed Arousal/Alertness: Awake/alert Orientation Level: Appears intact for tasks assessed Behavior During Session: [facility] for tasks performed    Extremity/Trunk Assessment Right Upper Extremity Assessment RUE ROM/Strength/Tone: [facility] for tasks assessed Left Upper Extremity Assessment LUE ROM/Strength/Tone: [facility] for tasks assessed     Mobility Bed Mobility Bed Mobility: Supine to Sit;Sitting - Scoot to Edge of Bed Supine to Sit: 4: Min assist;HOB flat Sitting - Scoot to [employer] of Bed: 4: Min guard Details for Bed Mobility Assistance: Assist to elevate trunk OOB.  Once sitting up, pt frequently rocking herself back into supine position while stating "I'm fine, I'm fine I'm fine"  Transfers Transfers: Sit to Stand;Stand to Sit Sit to Stand: 4: Min guard;From bed Stand to  Sit: 4: Min guard;To chair/3-in-1 Details for Transfer Assistance: VC for safe hand placement. Pt attempting to stand with bil UE supported on RW.  Close guarding for safety.     Shoulder Instructions     Exercise     Balance Balance Balance Assessed: Yes Static Sitting Balance Static Sitting - Balance Support: Feet supported Static Sitting - Level of Assistance: 4: Min assist;5: Stand by assistance Static Sitting - Comment/# of Minutes: Initially upon sitting up, pt continiously rocked onto her back returning into supine position but reporting "I'm fine".  Once balanced on EOB, pt only required stand by assist. Static Standing Balance Static Standing - Balance Support: Bilateral upper extremity supported Static Standing - Level of Assistance: 5: Stand by assistance;4: Min assist Static Standing - Comment/# of Minutes: Close guarding while standing with bil UE supported on RW.  No LOB noted.   End of Session OT - End of Session Equipment Utilized During Treatment: Gait belt Activity Tolerance: Patient tolerated treatment well Patient left: in chair;with call bell/phone within reach Nurse Communication: Other (comment);Mobility status (O2 readings on RA)  GO    06/26/2012 Cipriano Mile OTR/L Pager [phone] Office [phone]  Cipriano Mile 06/26/2012, 11:39 AM

## 2012-06-26 NOTE — Progress Notes (Signed)
Inpatient Diabetes Program Recommendations  AACE/ADA: New Consensus Statement on Inpatient Glycemic Control (2013)  Target Ranges:  Prepandial:   less than 140 mg/dL      Peak postprandial:   less than 180 mg/dL (1-2 hours)      Critically ill patients:  140 - 180 mg/dL    Results for [name], [name] (MRN [id number]) as of 06/26/2012 10:12  Ref. Range 06/25/2012 07:29 06/25/2012 11:54 06/25/2012 17:11 06/25/2012 21:21  Glucose-Capillary Latest Range: 70-99 mg/dL 846 (H) 962 (H) 952 (H) 206 (H)   Results for [name], [name] (MRN [id number]) as of 06/26/2012 10:12  Ref. Range 06/26/2012 07:20  Glucose-Capillary Latest Range: 70-99 mg/dL 027 (H)   Admitted with PNA.  CBGs elevated.    Inpatient Diabetes Program Recommendations Insulin - Basal: Please consider adding low dose Lantus while in hospital- Lantus 10 units QHS. HgbA1C: Please check current A1c level.  Note: Will follow. Ambrose Finland RN, MSN, CDE Diabetes Coordinator Inpatient Diabetes Program [phone]

## 2012-06-27 ENCOUNTER — Inpatient Hospital Stay (HOSPITAL_COMMUNITY): Payer: Medicare Other

## 2012-06-27 LAB — BASIC METABOLIC PANEL
GFR calc Af Amer: 27 mL/min — ABNORMAL LOW (ref >90)
GFR calc non Af Amer: 23 mL/min — ABNORMAL LOW (ref >90)
Glucose, Bld: 184 mg/dL — ABNORMAL HIGH (ref 70–99)
Potassium: 4.1 mEq/L (ref 3.5–5.1)
Sodium: 131 mEq/L — ABNORMAL LOW (ref 135–145)

## 2012-06-27 LAB — GLUCOSE, CAPILLARY
Glucose-Capillary: 157 mg/dL — ABNORMAL HIGH (ref 70–99)
Glucose-Capillary: 175 mg/dL — ABNORMAL HIGH (ref 70–99)
Glucose-Capillary: 161 mg/dL — ABNORMAL HIGH (ref 70–99)
Glucose-Capillary: 205 mg/dL — ABNORMAL HIGH (ref 70–99)

## 2012-06-27 NOTE — Progress Notes (Signed)
Occupational Therapy Treatment Patient Details Name: [name] MRN: [id number] DOB: 06-18-1940 Today's Date: 06/27/2012 Time: 1040-1107 OT Time Calculation (min): 27 min  OT Assessment / Plan / Recommendation Comments on Treatment Session This 72 yo female admitted with CAP and SOB presents to acute OT making progress since yesterday. Will continue to benefit from acute OT with follow up HHOT.    Follow Up Recommendations  Home health OT       Equipment Recommendations  Tub/shower seat (she will get a new one on her own)       Frequency Min 2X/week   Plan Discharge plan remains appropriate    Precautions / Restrictions Precautions Precaution Comments: No O2 on her or on wall when I entered the room, checked O2 sats (RA at rest 98%, HR 55); after doff/don socks and ambulating 50 feet pt sounded very dyspnic (RA O2 sats 97% and HR 54)--thus sounded alot worse than vitals showed Restrictions Weight Bearing Restrictions: No   Pertinent Vitals/Pain RA at rest 98% and HR 54; RA immediately post sitting down 97% and HR 54    ADL  Lower Body Dressing: Performed;Modified independent (increased time due to DOE) Where Assessed - Lower Body Dressing: Unsupported sit to stand Toilet Transfer: Simulated;Modified independent (with increased time due to DOE) Statistician Method: Sit to Barista:  (Bed, down hallway back to chair then over to bed) Equipment Used: Rolling walker Transfers/Ambulation Related to ADLs: Mod I with all with use of RW ADL Comments: Spoke with pt about energy conservation techniques and issued her a handout as well on this.  Recommended to her that she may not want to be driving her sons to work right now to give herself a chance to get better. She also says that she just has to take her broken tub seat to the place in [place] that she got it and they will give her a new one.      OT Goals ADL Goals ADL Goal: Lower Body Dressing -  Progress: Met ADL Goal: Toilet Transfer - Progress: Met Miscellaneous OT Goals OT Goal: Miscellaneous Goal #2 - Progress: Progressing toward goals  Visit Information  Last OT Received On: 06/27/12 Assistance Needed: +1          Cognition  Overall Cognitive Status: Appears within functional limits for tasks assessed/performed Arousal/Alertness: Awake/alert Orientation Level: Appears intact for tasks assessed Behavior During Session: [facility] for tasks performed    Mobility   Bed Mobility Details for Bed Mobility Assistance: Pt sitting on EOB upon arrival. Transfers Transfers: Sit to Stand;Stand to Sit Sit to Stand: 6: Modified independent (Device/Increase time);With upper extremity assist;From bed Stand to Sit: 6: Modified independent (Device/Increase time);Without upper extremity assist;To chair/3-in-1       Exercises  Other Exercises Other Exercises: Educated pt on purse lipped breating      End of Session OT - End of Session Equipment Utilized During Treatment:  (RW) Activity Tolerance:  (limited by DOE) Patient left: with call bell/phone within reach;with family/visitor present (sitting EOB, sister)       Evette Georges [phone] 06/27/2012, 11:27 AM

## 2012-06-27 NOTE — Progress Notes (Signed)
Physical Therapy Treatment Patient Details Name: [name] MRN: [id number] DOB: 09-25-1940 Today's Date: 06/27/2012 Time: 7829-5621 PT Time Calculation (min): 23 min  PT Assessment / Plan / Recommendation Comments on Treatment Session  Pt admitted with PNA, SOB and hyperglycemia and is progressing well with mobility. Pt able to maintain sats 92-94% on RA throughout ambulation with one momentary drop to 89% which immediately elevated with cueing for breathing technique. With HEP and in room activities sats 95% on RA HR 73. Pt encouraged to continue ambulation throughout day and maintain HEP to increase strength and ease mobility. Pt able to demonstrate pursed lip breathing end of session and discuss energy conservation.     Follow Up Recommendations        Does the patient have the potential to tolerate intense rehabilitation     Barriers to Discharge        Equipment Recommendations       Recommendations for Other Services    Frequency     Plan Discharge plan remains appropriate;Frequency remains appropriate    Precautions / Restrictions Precautions Precautions: Fall Precaution Comments: No O2 on her or on wall when I entered the room, checked O2 sats (RA at rest 98%, HR 55); after doff/don socks and ambulating 50 feet pt sounded very dyspnic (RA O2 sats 97% and HR 54)--thus sounded alot worse than vitals showed Restrictions Weight Bearing Restrictions: No   Pertinent Vitals/Pain No pain just fatigue    Mobility  Bed Mobility Bed Mobility: Not assessed Details for Bed Mobility Assistance: Pt EOB on arrival Transfers Sit to Stand: 6: Modified independent (Device/Increase time);From bed Stand to Sit: 6: Modified independent (Device/Increase time);To chair/3-in-1;With armrests Ambulation/Gait Ambulation/Gait Assistance: 5: Supervision Ambulation Distance (Feet): 350 Feet Assistive device: Rolling walker Ambulation/Gait Assistance Details: cueing for position on RW and for  breathing technique as well as rest breaks as needed. Pt with 2 standing rests during ambulation  Gait Pattern: Step-through pattern;Decreased stride length Gait velocity: decreased 72ft/58sec=.38 ft/sec Stairs: Yes Stairs Assistance: 6: Modified independent (Device/Increase time) Stair Management Technique: One rail Right;Sideways Number of Stairs: 2     Exercises General Exercises - Lower Extremity Long Arc Quad: AROM;Both;20 reps;Seated Hip ABduction/ADduction: AROM;Both;20 reps;Seated Hip Flexion/Marching: AROM;Both;20 reps;Seated Toe Raises: AROM;Both;20 reps;Seated Heel Raises: AROM;Both;20 reps;Seated Other Exercises Other Exercises: Educated pt on purse lipped breating   PT Diagnosis:    PT Problem List:   PT Treatment Interventions:     PT Goals Acute Rehab PT Goals PT Goal: Sit to Stand - Progress: Progressing toward goal PT Goal: Stand to Sit - Progress: Progressing toward goal PT Goal: Ambulate - Progress: Progressing toward goal PT Goal: Up/Down Stairs - Progress: Met  Visit Information  Last PT Received On: 06/27/12 Assistance Needed: +1    Subjective Data  Subjective: I"m just a little tired   Cognition  Overall Cognitive Status: Appears within functional limits for tasks assessed/performed Arousal/Alertness: Awake/alert Orientation Level: Appears intact for tasks assessed Behavior During Session: [facility] for tasks performed    Balance     End of Session PT - End of Session Activity Tolerance: Patient tolerated treatment well Patient left: in chair;with call bell/phone within reach;with family/visitor present Nurse Communication: Mobility status   GP     Delorse Lek 06/27/2012, 11:45 AM Delaney Meigs, PT [phone]

## 2012-06-27 NOTE — Progress Notes (Signed)
[facility] PROGRESS NOTE  [name] AVW:[id number] DOB: 1941-03-16 DOA: 06/24/2012 PCP: Josue Hector, MD  Assessment/Plan: Principal Problem:  *Community acquired pneumonia Active Problems:  Diabetes mellitus  Anemia  ARF (acute renal failure)     #1. Community acquired pneumonia - patient started on IV antibiotics on admission. Influenza panel negative and patient is afebrile. Since patient has diarrhea which could be from the pneumonic process we'll check urine for Legionella. Also check for C. difficile colitis which is less likely.  #2. Acute renal failure on chronic kidney disease, likely prerenal ATN  - Closely follow intake output and metabolic panel. Stopped lisinopril on 06/25/12. Started on normal saline his creatinine trending up ,renal USG #3. Diabetes mellitus type 2 - continue with home medications except for metformin. Sliding-scale coverage.  #4. Chronic anemia probably from chronic kidney disease - closely follow CBC anemia appears at baseline.  #5. CAD medically managed - denies any chest pain at this time. Troponins were negative.     Code Status: full  Family Communication: family updated about patient's clinical progress and discussed with the patient's daughter [name] [phone], [phone]  Disposition Plan: As above   Consultants:  none Procedures:  none Antibiotics:  Rocephin  Zithromax   HPI/Subjective:  Still with cough and dyspnea , diarrhea improved   Objective: Filed Vitals:   06/26/12 1621 06/26/12 2115 06/27/12 0540 06/27/12 0921  BP: 119/62 111/70 141/62 108/47  Pulse: 75 64 65 66  Temp: 97.5 F (36.4 C) 97.5 F (36.4 C)  97.4 F (36.3 C)  TempSrc:  Oral  Oral  Resp: 19  17 18   Height:      Weight:  107.9 kg (237 lb 14 oz)    SpO2: 100% 94% 98% 99%    Intake/Output Summary (Last 24 hours) at 06/27/12 1057 Last data filed at 06/27/12 0734  Gross per 24 hour  Intake    240 ml  Output   1025 ml  Net   -785 ml     Exam:  HENT:  Head: Atraumatic.  Nose: Nose normal.  Mouth/Throat: Oropharynx is clear and moist.  Eyes: Conjunctivae are normal. Pupils are equal, round, and reactive to light. No scleral icterus.  Neck: Neck supple. No tracheal deviation present.  Cardiovascular: Normal rate, regular rhythm, normal heart sounds and intact distal pulses.  Pulmonary/Chest: Effort normal and breath sounds normal. No respiratory distress.  Abdominal: Soft. Normal appearance and bowel sounds are normal. She exhibits no distension. There is no tenderness.  Musculoskeletal: She exhibits no edema and no tenderness.  Neurological: She is alert. No cranial nerve deficit.    Data Reviewed: Basic Metabolic Panel:  Lab 06/27/12 4696 06/26/12 0830 06/25/12 0525 06/24/12 2224 06/24/12 1645  NA 131* 133* 134* -- 132*  K 4.1 4.0 3.4* -- 3.7  CL 95* 96 96 -- 95*  CO2 25 24 28  -- 25  GLUCOSE 184* 328* 262* -- 283*  BUN 42* 32* 27* -- 28*  CREATININE 2.06* 1.95* 1.72* 1.59* 1.63*  CALCIUM 8.3* 8.5 8.6 -- 8.6  MG -- -- -- -- --  PHOS -- -- -- -- --    Liver Function Tests:  Lab 06/26/12 0830 06/24/12 1645  AST 18 14  ALT 10 10  ALKPHOS 56 64  BILITOT 0.2* 0.2*  PROT 6.6 6.6  ALBUMIN 2.6* 2.8*   No results found for this basename: LIPASE:5,AMYLASE:5 in the last 168 hours No results found for this basename: AMMONIA:5 in the last 168 hours  CBC:  Lab 06/25/12 0525 06/24/12 2224 06/24/12 1645  WBC 7.3 8.4 8.8  NEUTROABS -- -- 6.6  HGB 9.5* 9.4* 9.6*  HCT 29.2* 29.0* 29.7*  MCV 89.6 90.6 90.5  PLT 136* 137* 145*    Cardiac Enzymes: No results found for this basename: CKTOTAL:5,CKMB:5,CKMBINDEX:5,TROPONINI:5 in the last 168 hours BNP (last 3 results)  Basename 06/24/12 1740  PROBNP 4638.0*     CBG:  Lab 06/27/12 0732 06/26/12 2105 06/26/12 1623 06/26/12 1116 06/26/12 0720  GLUCAP 157* 178* 193* 274* 217*    Recent Results (from the past 240 hour(s))  CLOSTRIDIUM DIFFICILE BY PCR      Status: Normal   Collection Time   06/26/12  3:17 AM      Component Value Range Status Comment   C difficile by pcr NEGATIVE  NEGATIVE Final      Studies: Dg Chest 2 View  06/24/2012  *RADIOLOGY REPORT*  Clinical Data: Chest pain and shortness of breath.  CHEST - 2 VIEW  Comparison: PA and lateral chest 01/11/2012 and 05/03/2011.  Findings: There is mild cardiomegaly.  The patient has focal left lower lobe airspace disease which is new since the prior examinations.  No pneumothorax or pleural fluid. Thoracolumbar scoliosis is identified.  IMPRESSION:  1.  Focal left lower lobe airspace disease worrisome for pneumonia. Recommend follow-up plain films to clearing. 2.  Cardiomegaly.   Original Report Authenticated By: Holley Dexter, M.D.     Scheduled Meds:   . aspirin EC  81 mg Oral Daily  . atorvastatin  40 mg Oral q1800  . azithromycin  500 mg Oral Daily  . carvedilol  6.25 mg Oral BID WC  . cefTRIAXone (ROCEPHIN)  IV  1 g Intravenous Q24H  . docusate sodium  100 mg Oral Daily  . enoxaparin (LOVENOX) injection  40 mg Subcutaneous Q24H  . ferrous sulfate  325 mg Oral Q breakfast  . insulin aspart  0-9 Units Subcutaneous TID WC  . isosorbide mononitrate  30 mg Oral Daily  . linagliptin  5 mg Oral Daily  . pantoprazole  40 mg Oral Daily  . pregabalin  50 mg Oral BID  . sodium chloride  3 mL Intravenous Q12H  . sodium chloride  3 mL Intravenous Q12H  . vitamin B-12  1,000 mcg Oral Daily   Continuous Infusions:   . sodium chloride 100 mL/hr at 06/26/12 1101    Principal Problem:  *Community acquired pneumonia Active Problems:  Diabetes mellitus  Anemia  ARF (acute renal failure)    Time spent: 40 minutes   [facility]  [facility] Pager [phone]. If 8PM-8AM, please contact night-coverage at [email or web address], password [facility] 06/27/2012, 10:57 AM  LOS: 3 days

## 2012-06-28 LAB — BASIC METABOLIC PANEL
CO2: 24 mEq/L (ref 19–32)
Chloride: 99 mEq/L (ref 96–112)
GFR calc Af Amer: 30 mL/min — ABNORMAL LOW (ref >90)
Potassium: 4 mEq/L (ref 3.5–5.1)
Sodium: 132 mEq/L — ABNORMAL LOW (ref 135–145)

## 2012-06-28 LAB — CBC
MCV: 87.9 fL (ref 78.0–100.0)
Platelets: 150 10*3/uL (ref 150–400)
RBC: 3.06 MIL/uL — ABNORMAL LOW (ref 3.87–5.11)
WBC: 5.6 10*3/uL (ref 4.0–10.5)

## 2012-06-28 LAB — GLUCOSE, CAPILLARY
Glucose-Capillary: 175 mg/dL — ABNORMAL HIGH (ref 70–99)
Glucose-Capillary: 183 mg/dL — ABNORMAL HIGH (ref 70–99)
Glucose-Capillary: 133 mg/dL — ABNORMAL HIGH (ref 70–99)
Glucose-Capillary: 265 mg/dL — ABNORMAL HIGH (ref 70–99)

## 2012-06-28 MED ORDER — GLUCERNA SHAKE PO LIQD: 237.0000 mL | Freq: Every day | ORAL | Status: DC | PRN

## 2012-06-28 MED ORDER — HYDRALAZINE HCL 20 MG/ML IJ SOLN
10.0000 mg | Freq: Four times a day (QID) | INTRAMUSCULAR | Status: DC | PRN
  Filled 2012-06-28: qty 0.5

## 2012-06-28 NOTE — Progress Notes (Signed)
NUTRITION FOLLOW UP  Intervention:   1. Add Glucerna Shake po prn, each supplement provides 220 kcal and 10 grams of protein. 2. RD to continue to follow nutrition care plan  Nutrition Dx:   Increased nutrient needs related to PNA as evidenced by estimated needs. Ongoing.  Goal:   Diet advancement to meet >/= 90% of their estimated nutrition needs. Met.  Monitor:   weight trends, lab trends, I/O's, supplement tolerance, diet advancement  Assessment:   Admitted with diarrhea, cough, fever and chills x 2-3 days. Pt currently afebrile. Work-up reveals PNA. Influenza panel pending. Also with acute on chronic kidney disease. From home with family.  Advanced to Carbohydrate Modified Medium diet on 1/21. Pt is currently eating 100% of her meals. Diarrhea improving. Suspect pt is likely meeting her estimated needs with current po intake.  Height: Ht Readings from Last 1 Encounters:  06/24/12 5\' 3"  (1.6 m)    Weight Status:   Wt Readings from Last 1 Encounters:  06/27/12 236 lb 11.2 oz (107.366 kg)    Re-estimated needs:  Kcal: 1650 - 1750 kcal Protein: 75 - 85 grams protein Fluid: 1.6 - 1.8 liters daily  Skin: intact  Diet Order: Carb Control Medium (1600 - 2000)   Intake/Output Summary (Last 24 hours) at 06/28/12 0943 Last data filed at 06/28/12 0844  Gross per 24 hour  Intake 5698.33 ml  Output   2650 ml  Net 3048.33 ml    Last BM: 1/23   Labs:   Lab 06/28/12 0530 06/27/12 0515 06/26/12 0830  NA 132* 131* 133*  K 4.0 4.1 4.0  CL 99 95* 96  CO2 24 25 24   BUN 39* 42* 32*  CREATININE 1.91* 2.06* 1.95*  CALCIUM 8.4 8.3* 8.5  MG -- -- --  PHOS -- -- --  GLUCOSE 177* 184* 328*    CBG (last 3)   Basename 06/28/12 0737 06/27/12 2046 06/27/12 1655  GLUCAP 175* 205* 161*    Scheduled Meds:   . aspirin EC  81 mg Oral Daily  . atorvastatin  40 mg Oral q1800  . azithromycin  500 mg Oral Daily  . carvedilol  6.25 mg Oral BID WC  . cefTRIAXone (ROCEPHIN)  IV   1 g Intravenous Q24H  . docusate sodium  100 mg Oral Daily  . enoxaparin (LOVENOX) injection  40 mg Subcutaneous Q24H  . ferrous sulfate  325 mg Oral Q breakfast  . insulin aspart  0-9 Units Subcutaneous TID WC  . isosorbide mononitrate  30 mg Oral Daily  . linagliptin  5 mg Oral Daily  . pantoprazole  40 mg Oral Daily  . pregabalin  50 mg Oral BID  . sodium chloride  3 mL Intravenous Q12H  . sodium chloride  3 mL Intravenous Q12H  . vitamin B-12  1,000 mcg Oral Daily    Continuous Infusions:   . sodium chloride 100 mL/hr at 06/26/12 [address] [place], [place], [place] Pager: [phone] After-hours pager: [phone]

## 2012-06-28 NOTE — Progress Notes (Signed)
Utilization review completed.  

## 2012-06-28 NOTE — Progress Notes (Signed)
BP 185/62, pulse 68. Pt asymptomatic at this time, sitting in the chair. Pt reports being slightly lightheaded with activity, but that it goes away when she sits and rests. Dr Susie Cassette notified. New orders received. Will continue to monitor. [place], Rosanna Randy

## 2012-06-28 NOTE — Progress Notes (Signed)
[facility] PROGRESS NOTE  [name]:[id number] DOB: [birth year] DOA: 06/24/2012 PCP: Josue Hector, MD  Assessment/Plan: Principal Problem:  *Community acquired pneumonia Active Problems:  Diabetes mellitus  Anemia  ARF (acute renal failure)    1. Community acquired pneumonia - patient started on IV antibiotics on admission. Influenza panel negative and patient is afebrile. Since patient has diarrhea which could be from the pneumonic process we'll check urine for Legionella. Also check for C. difficile colitis which is less likely.  #2. Acute renal failure on chronic kidney disease, likely prerenal ATN - Closely follow intake output and metabolic panel. Stopped lisinopril on 06/25/12. Started on normal saline his creatinine trending up ,renal USG negative, creatinine improved, if continues to decline, anticipate discharge home tomorrow  #3. Diabetes mellitus type 2 - continue with home medications except for metformin. Sliding-scale coverage.  #4. Chronic anemia probably from chronic kidney disease - closely follow CBC anemia appears at baseline.  #5. CAD medically managed - denies any chest pain at this time. Troponins were negative.     Code Status: full  Family Communication: family updated about patient's clinical progress and discussed with the patient's daughter [name] [phone]  Disposition Plan: As above    Consultants:  none Procedures:  none Antibiotics:  Rocephin  Zithromax  HPI/Subjective:  Still with cough and dyspnea , diarrhea improved      Objective: Filed Vitals:   06/27/12 2100 06/28/12 0500 06/28/12 0853 06/28/12 1100  BP: 141/56 182/51 185/62 158/62  Pulse: 91 58 68 58  Temp: 97.5 F (36.4 C) 97.6 F (36.4 C) 97.5 F (36.4 C)   TempSrc:   Oral   Resp: 18 18 18    Height:      Weight: 107.366 kg (236 lb 11.2 oz)     SpO2: 97% 99% 100%     Intake/Output Summary (Last 24 hours) at 06/28/12 1240 Last data filed at  06/28/12 0844  Gross per 24 hour  Intake 5698.33 ml  Output   2650 ml  Net 3048.33 ml    Exam:  HENT:  Head: Atraumatic.  Nose: Nose normal.  Mouth/Throat: Oropharynx is clear and moist.  Eyes: Conjunctivae are normal. Pupils are equal, round, and reactive to light. No scleral icterus.  Neck: Neck supple. No tracheal deviation present.  Cardiovascular: Normal rate, regular rhythm, normal heart sounds and intact distal pulses.  Pulmonary/Chest: Effort normal and breath sounds normal. No respiratory distress.  Abdominal: Soft. Normal appearance and bowel sounds are normal. She exhibits no distension. There is no tenderness.  Musculoskeletal: She exhibits no edema and no tenderness.  Neurological: She is alert. No cranial nerve deficit.    Data Reviewed: Basic Metabolic Panel:  Lab 06/28/12 3086 06/27/12 0515 06/26/12 0830 06/25/12 0525 06/24/12 2224 06/24/12 1645  NA 132* 131* 133* 134* -- 132*  K 4.0 4.1 4.0 3.4* -- 3.7  CL 99 95* 96 96 -- 95*  CO2 24 25 24 28  -- 25  GLUCOSE 177* 184* 328* 262* -- 283*  BUN 39* 42* 32* 27* -- 28*  CREATININE 1.91* 2.06* 1.95* 1.72* 1.59* --  CALCIUM 8.4 8.3* 8.5 8.6 -- 8.6  MG -- -- -- -- -- --  PHOS -- -- -- -- -- --    Liver Function Tests:  Lab 06/26/12 0830 06/24/12 1645  AST 18 14  ALT 10 10  ALKPHOS 56 64  BILITOT 0.2* 0.2*  PROT 6.6 6.6  ALBUMIN 2.6* 2.8*   No results found for this  basename: LIPASE:5,AMYLASE:5 in the last 168 hours No results found for this basename: AMMONIA:5 in the last 168 hours  CBC:  Lab 06/28/12 0530 06/25/12 0525 06/24/12 2224 06/24/12 1645  WBC 5.6 7.3 8.4 8.8  NEUTROABS -- -- -- 6.6  HGB 8.8* 9.5* 9.4* 9.6*  HCT 26.9* 29.2* 29.0* 29.7*  MCV 87.9 89.6 90.6 90.5  PLT 150 136* 137* 145*    Cardiac Enzymes: No results found for this basename: CKTOTAL:5,CKMB:5,CKMBINDEX:5,TROPONINI:5 in the last 168 hours BNP (last 3 results)  Basename 06/24/12 1740  PROBNP 4638.0*     CBG:  Lab  06/28/12 1141 06/28/12 0737 06/27/12 2046 06/27/12 1655 06/27/12 1135  GLUCAP 183* 175* 205* 161* 175*    Recent Results (from the past 240 hour(s))  CLOSTRIDIUM DIFFICILE BY PCR     Status: Normal   Collection Time   06/26/12  3:17 AM      Component Value Range Status Comment   C difficile by pcr NEGATIVE  NEGATIVE Final      Studies: Dg Chest 2 View  06/24/2012  *RADIOLOGY REPORT*  Clinical Data: Chest pain and shortness of breath.  CHEST - 2 VIEW  Comparison: PA and lateral chest 01/11/2012 and 05/03/2011.  Findings: There is mild cardiomegaly.  The patient has focal left lower lobe airspace disease which is new since the prior examinations.  No pneumothorax or pleural fluid. Thoracolumbar scoliosis is identified.  IMPRESSION:  1.  Focal left lower lobe airspace disease worrisome for pneumonia. Recommend follow-up plain films to clearing. 2.  Cardiomegaly.   Original Report Authenticated By: Holley Dexter, M.D.    US Renal  06/27/2012  *RADIOLOGY REPORT*  Clinical Data: Renal failure.  RENAL/URINARY TRACT ULTRASOUND COMPLETE  Comparison:  Abdominal CT 03/12/2006  Findings:  Right Kidney:  The right kidney measures 11.0 cm in length.  There is no evidence for hydronephrosis.  The right kidney echotexture is within normal limits.  Left Kidney:  Left kidney measures 11.2 cm in length with normal renal echotexture.  No evidence for hydronephrosis.  Bladder:  Nondistended.  IMPRESSION: Normal renal ultrasound.   Original Report Authenticated By: Richarda Overlie, M.D.     Scheduled Meds:   . aspirin EC  81 mg Oral Daily  . atorvastatin  40 mg Oral q1800  . azithromycin  500 mg Oral Daily  . carvedilol  6.25 mg Oral BID WC  . cefTRIAXone (ROCEPHIN)  IV  1 g Intravenous Q24H  . docusate sodium  100 mg Oral Daily  . enoxaparin (LOVENOX) injection  40 mg Subcutaneous Q24H  . ferrous sulfate  325 mg Oral Q breakfast  . insulin aspart  0-9 Units Subcutaneous TID WC  . isosorbide mononitrate  30 mg  Oral Daily  . linagliptin  5 mg Oral Daily  . pantoprazole  40 mg Oral Daily  . pregabalin  50 mg Oral BID  . sodium chloride  3 mL Intravenous Q12H  . sodium chloride  3 mL Intravenous Q12H  . vitamin B-12  1,000 mcg Oral Daily   Continuous Infusions:   . sodium chloride 100 mL/hr at 06/26/12 1101    Principal Problem:  *Community acquired pneumonia Active Problems:  Diabetes mellitus  Anemia  ARF (acute renal failure)    Time spent: 40 minutes   [facility]  [facility] Pager [phone]. If 8PM-8AM, please contact night-coverage at [email or web address], password [email or web address] 06/28/2012, 12:40 PM  LOS: 4 days

## 2012-06-28 NOTE — Care Management Note (Addendum)
   CARE MANAGEMENT NOTE 06/28/2012  Patient:  [name], [name]   Account Number:  [id number]  Date Initiated:  06/25/2012  Documentation initiated by:  Darlyne Russian  Subjective/Objective Assessment:   Patient admitted with pneumonia, ARF/CRF     Action/Plan:   Progression of care and discharge planning  06/28/2012 Pt for d/c to home with HHPT and HHOT.   Anticipated DC Date:  06/28/2012   Anticipated DC Plan:  HOME W HOME HEALTH SERVICES         [facility] Choice  HOME HEALTH   Choice offered to / List presented to:  C-1 Patient        HH arranged  HH-2 PT  HH-3 OT      [facility] agency  [facility]   Status of service:  Completed, signed off Medicare Important Message given?   (If response is "NO", the following Medicare IM given date fields will be blank) Date Medicare IM given:   Date Additional Medicare IM given:    Discharge Disposition:  HOME W HOME HEALTH SERVICES  Per UR Regulation:  Reviewed for med. necessity/level of care/duration of stay  If discussed at Long Length of Stay Meetings, dates discussed:    Comments:  06/28/2012 Pt active with [facility] , they will resume services and start PT/OT. Johny Shock RN MPH   [facility] active with pt , will resume [facility] services. CRoyal RN MPH

## 2012-06-29 LAB — BASIC METABOLIC PANEL
Calcium: 8.2 mg/dL — ABNORMAL LOW (ref 8.4–10.5)
Creatinine, Ser: 1.74 mg/dL — ABNORMAL HIGH (ref 0.50–1.10)
GFR calc non Af Amer: 29 mL/min — ABNORMAL LOW (ref >90)
Sodium: 136 mEq/L (ref 135–145)

## 2012-06-29 MED ORDER — GUAIFENESIN-DM 100-10 MG/5ML PO SYRP: 5.0000 mL | ORAL_SOLUTION | ORAL | Status: DC | PRN

## 2012-06-29 MED ORDER — LEVOFLOXACIN 250 MG PO TABS: 250.0000 mg | ORAL_TABLET | Freq: Every day | ORAL | Status: DC

## 2012-06-29 MED ORDER — LEVOFLOXACIN 250 MG PO TABS: 250.0000 mg | ORAL_TABLET | Freq: Every day | ORAL | Status: AC

## 2012-06-29 NOTE — Discharge Summary (Signed)
.      Physician Discharge Summary  [name] MRN: [id number] DOB/AGE: 08/11/1940 72 y.o.  PCP: Josue Hector, MD   Admit date: 06/24/2012 Discharge date: 06/29/2012  Discharge Diagnoses:     *Community acquired pneumonia Active Problems:  Diabetes mellitus  Anemia  ARF (acute renal failure)     Medication List     As of 06/29/2012  9:20 AM    STOP taking these medications         furosemide 20 MG tablet   Commonly known as: LASIX      lisinopril 20 MG tablet   Commonly known as: PRINIVIL,ZESTRIL      metFORMIN 1000 MG tablet   Commonly known as: GLUCOPHAGE      TAKE these medications         acetaminophen 650 MG CR tablet   Commonly known as: TYLENOL   Take 1,300 mg by mouth 3 (three) times daily.      aspirin EC 81 MG tablet   Take 81 mg by mouth daily as needed. For pain      carvedilol 6.25 MG tablet   Commonly known as: COREG   Take 6.25 mg by mouth 2 (two) times daily with a meal.      docusate sodium 100 MG capsule   Commonly known as: COLACE   Take 100 mg by mouth daily.      ferrous sulfate 325 (65 FE) MG tablet   Take 325 mg by mouth daily with breakfast.      glimepiride 4 MG tablet   Commonly known as: AMARYL   Take 4 mg by mouth 2 (two) times daily.      guaiFENesin-dextromethorphan 100-10 MG/5ML syrup   Commonly known as: ROBITUSSIN DM   Take 5 mLs by mouth every 4 (four) hours as needed for cough.      isosorbide mononitrate 30 MG 24 hr tablet   Commonly known as: IMDUR   Take 30 mg by mouth daily.      levofloxacin 250 MG tablet   Commonly known as: LEVAQUIN   Take 1 tablet (250 mg total) by mouth daily.      nitroGLYCERIN 0.4 MG SL tablet   Commonly known as: NITROSTAT   Place 0.4 mg under the tongue every 5 (five) minutes as needed. For chest pain      pantoprazole 40 MG tablet   Commonly known as: PROTONIX   Take 40 mg by mouth daily.      pregabalin 50 MG capsule   Commonly known as: LYRICA   Take 50 mg by  mouth 2 (two) times daily.      rosuvastatin 20 MG tablet   Commonly known as: CRESTOR   Take 20 mg by mouth at bedtime.      sitaGLIPtin 100 MG tablet   Commonly known as: JANUVIA   Take 100 mg by mouth daily.      vitamin B-12 1000 MCG tablet   Commonly known as: CYANOCOBALAMIN   Take 1,000 mcg by mouth daily.        Discharge Condition: Stable  Disposition: 01-Home or Self Care   Consults: None  Significant Diagnostic Studies: Dg Chest 2 View  06/24/2012  *RADIOLOGY REPORT*  Clinical Data: Chest pain and shortness of breath.  CHEST - 2 VIEW  Comparison: PA and lateral chest 01/11/2012 and 05/03/2011.  Findings: There is mild cardiomegaly.  The patient has focal left lower lobe airspace disease which is new since the  prior examinations.  No pneumothorax or pleural fluid. Thoracolumbar scoliosis is identified.  IMPRESSION:  1.  Focal left lower lobe airspace disease worrisome for pneumonia. Recommend follow-up plain films to clearing. 2.  Cardiomegaly.   Original Report Authenticated By: Holley Dexter, M.D.    US Renal  06/27/2012  *RADIOLOGY REPORT*  Clinical Data: Renal failure.  RENAL/URINARY TRACT ULTRASOUND COMPLETE  Comparison:  Abdominal CT 03/12/2006  Findings:  Right Kidney:  The right kidney measures 11.0 cm in length.  There is no evidence for hydronephrosis.  The right kidney echotexture is within normal limits.  Left Kidney:  Left kidney measures 11.2 cm in length with normal renal echotexture.  No evidence for hydronephrosis.  Bladder:  Nondistended.  IMPRESSION: Normal renal ultrasound.   Original Report Authenticated By: [name], M.D.        Microbiology: Recent Results (from the past 240 hour(s))  CLOSTRIDIUM DIFFICILE BY PCR     Status: Normal   Collection Time   06/26/12  3:17 AM      Component Value Range Status Comment   C difficile by pcr NEGATIVE  NEGATIVE Final      Labs: Results for orders placed during the hospital encounter of 06/24/12  (from the past 48 hour(s))  GLUCOSE, CAPILLARY     Status: Abnormal   Collection Time   06/27/12 11:35 AM      Component Value Range Comment   Glucose-Capillary 175 (*) 70 - 99 mg/dL   GLUCOSE, CAPILLARY     Status: Abnormal   Collection Time   06/27/12  4:55 PM      Component Value Range Comment   Glucose-Capillary 161 (*) 70 - 99 mg/dL   GLUCOSE, CAPILLARY     Status: Abnormal   Collection Time   06/27/12  8:46 PM      Component Value Range Comment   Glucose-Capillary 205 (*) 70 - 99 mg/dL   CBC     Status: Abnormal   Collection Time   06/28/12  5:30 AM      Component Value Range Comment   WBC 5.6  4.0 - 10.5 K/uL    RBC 3.06 (*) 3.87 - 5.11 MIL/uL    Hemoglobin 8.8 (*) 12.0 - 15.0 g/dL    HCT 40.9 (*) 81.1 - 46.0 %    MCV 87.9  78.0 - 100.0 fL    MCH 28.8  26.0 - 34.0 pg    MCHC 32.7  30.0 - 36.0 g/dL    RDW 91.4  78.2 - 95.6 %    Platelets 150  150 - 400 K/uL   BASIC METABOLIC PANEL     Status: Abnormal   Collection Time   06/28/12  5:30 AM      Component Value Range Comment   Sodium 132 (*) 135 - 145 mEq/L    Potassium 4.0  3.5 - 5.1 mEq/L    Chloride 99  96 - 112 mEq/L    CO2 24  19 - 32 mEq/L    Glucose, Bld 177 (*) 70 - 99 mg/dL    BUN 39 (*) 6 - 23 mg/dL    Creatinine, Ser 2.13 (*) 0.50 - 1.10 mg/dL    Calcium 8.4  8.4 - 08.6 mg/dL    GFR calc non Af Amer 25 (*) >90 mL/min    GFR calc Af Amer 30 (*) >90 mL/min   GLUCOSE, CAPILLARY     Status: Abnormal   Collection Time   06/28/12  7:37 AM  Component Value Range Comment   Glucose-Capillary 175 (*) 70 - 99 mg/dL   GLUCOSE, CAPILLARY     Status: Abnormal   Collection Time   06/28/12 11:41 AM      Component Value Range Comment   Glucose-Capillary 183 (*) 70 - 99 mg/dL   GLUCOSE, CAPILLARY     Status: Abnormal   Collection Time   06/28/12  5:16 PM      Component Value Range Comment   Glucose-Capillary 133 (*) 70 - 99 mg/dL   GLUCOSE, CAPILLARY     Status: Abnormal   Collection Time   06/28/12 10:13 PM       Component Value Range Comment   Glucose-Capillary 265 (*) 70 - 99 mg/dL   BASIC METABOLIC PANEL     Status: Abnormal   Collection Time   06/29/12  6:25 AM      Component Value Range Comment   Sodium 136  135 - 145 mEq/L    Potassium 4.2  3.5 - 5.1 mEq/L    Chloride 104  96 - 112 mEq/L    CO2 21  19 - 32 mEq/L    Glucose, Bld 215 (*) 70 - 99 mg/dL    BUN 34 (*) 6 - 23 mg/dL    Creatinine, Ser 8.29 (*) 0.50 - 1.10 mg/dL    Calcium 8.2 (*) 8.4 - 10.5 mg/dL    GFR calc non Af Amer 29 (*) >90 mL/min    GFR calc Af Amer 33 (*) >90 mL/min   GLUCOSE, CAPILLARY     Status: Abnormal   Collection Time   06/29/12  7:26 AM      Component Value Range Comment   Glucose-Capillary 181 (*) 70 - 99 mg/dL      HPI : 72 year-old female with history of diabetes mellitus type 2, hypertension, chronic kidney disease, chronic anemia and history of CAD managed medically presents with complaints of having shortness of breath with productive cough over the last 2-3 days. patient has been having in addition subjective feeling of fever chills. At her home others were also sick with symptoms of upper respiratory tract infection. Chest x-ray in the ER shows features consistent with pneumonia. Patient also was found to be febrile. At this time patient has been started on empiric antibiotics for community-acquired pneumonia. Influenza panel was negative. In addition patient has experienced some diarrhea last 2 days. C. difficile PCR was negative. Admitted for further evaluation   HOSPITAL COURSE:    1. Community acquired pneumonia - patient started on IV Rocephin and azithromycin on admission. Influenza panel negative and patient is afebrile. Urine Legionella and streptococcal new antigen was negative. She has been transitioned to levofloxacin renally dosed for 3 more days  #2. Acute renal failure on chronic kidney disease, likely prerenal ATN in the setting of dehydration/diarrhea -creatinine increase from 1.59-2.06,  trending down with IV fluids will need a BMP in one week. We Stopped lisinopril /Lasix/metformin on 06/25/12.  renal USG negative, creatinine improved, if continues to decline, outpatient BMP in one week  #3. Diabetes mellitus type 2 - continue with home medications holding metformin given renal insufficiency  #4. Chronic anemia probably from chronic kidney disease - closely follow CBC anemia appears at baseline.  #5. CAD medically managed - denies any chest pain at this time. Troponins were negative   #6 diarrhea resolved self-limiting  Discharge Exam:   Blood pressure 156/69, pulse 52, temperature 97.7 F (36.5 C), temperature source Oral, resp. rate 18,  height 5\' 3"  (1.6 m), weight 108.954 kg (240 lb 3.2 oz), SpO2 97.00%.  Right Ear: External ear normal.  Left Ear: External ear normal.  Nose: Nose normal.  Mouth/Throat: Oropharynx is clear and moist. No oropharyngeal exudate.  Eyes: Conjunctivae normal are normal. Pupils are equal, round, and reactive to light. Right eye exhibits no discharge. Left eye exhibits no discharge. No scleral icterus.  Neck: Normal range of motion. Neck supple.  Cardiovascular: Normal rate and regular rhythm.  Respiratory: Effort normal and breath sounds normal. No respiratory distress. She has no wheezes. She has no rales.  GI: Soft. Bowel sounds are normal. She exhibits no distension. There is no tenderness. There is no rebound.  Musculoskeletal: She exhibits no edema and no tenderness.  Neurological: She is alert and oriented to person, place, and time.  Moves all extremities.  Skin: Skin is warm and dry. She is not diaphoretic.           Follow-up Information    Follow up with Josue Hector, MD. Schedule an appointment as soon as possible for a visit in 1 week. (BMP in one week)    Contact information:   [address] [facility] [address] [phone]          Signed: Richarda Weeks 06/29/2012, 9:20 AM

## 2012-08-19 ENCOUNTER — Other Ambulatory Visit: Payer: Self-pay | Admitting: Cardiology

## 2012-08-20 NOTE — Telephone Encounter (Signed)
..   Requested Prescriptions   Pending Prescriptions Disp Refills  . carvedilol (COREG) 6.25 MG tablet [Pharmacy Med Name: CARVEDILOL 6.25MG    TAB] 60 tablet 0    Sig: TAKE ONE TABLET BY MOUTH TWICE DAILY WITH  A  MEAL  .Marland KitchenPatient needs to contact office to schedule  Appointment  for future refills.Ph:[phone]. Thank you.

## 2012-08-21 ENCOUNTER — Other Ambulatory Visit (HOSPITAL_COMMUNITY): Payer: Self-pay | Admitting: Nephrology

## 2012-08-21 DIAGNOSIS — N289 Disorder of kidney and ureter, unspecified: Secondary | ICD-10-CM

## 2012-09-11 ENCOUNTER — Other Ambulatory Visit: Payer: Self-pay | Admitting: Cardiology

## 2012-09-12 ENCOUNTER — Ambulatory Visit (HOSPITAL_COMMUNITY): Payer: Medicare Other | Attending: Nephrology

## 2012-10-06 ENCOUNTER — Emergency Department (HOSPITAL_COMMUNITY): Payer: Medicare Other

## 2012-10-06 ENCOUNTER — Inpatient Hospital Stay (HOSPITAL_COMMUNITY)
Admission: EM | Admit: 2012-10-06 | Discharge: 2012-10-10 | DRG: 683 | Disposition: A | Discharge status: Home / Self Care | Payer: Medicare Other | Attending: Internal Medicine | Admitting: Internal Medicine

## 2012-10-06 ENCOUNTER — Encounter (HOSPITAL_COMMUNITY): Payer: Self-pay

## 2012-10-06 DIAGNOSIS — E1140 Type 2 diabetes mellitus with diabetic neuropathy, unspecified: Secondary | ICD-10-CM

## 2012-10-06 DIAGNOSIS — E876 Hypokalemia: Secondary | ICD-10-CM | POA: Diagnosis present

## 2012-10-06 DIAGNOSIS — E1142 Type 2 diabetes mellitus with diabetic polyneuropathy: Secondary | ICD-10-CM | POA: Diagnosis present

## 2012-10-06 DIAGNOSIS — Z9089 Acquired absence of other organs: Secondary | ICD-10-CM

## 2012-10-06 DIAGNOSIS — E785 Hyperlipidemia, unspecified: Secondary | ICD-10-CM | POA: Diagnosis present

## 2012-10-06 DIAGNOSIS — Z79899 Other long term (current) drug therapy: Secondary | ICD-10-CM

## 2012-10-06 DIAGNOSIS — I2510 Atherosclerotic heart disease of native coronary artery without angina pectoris: Secondary | ICD-10-CM

## 2012-10-06 DIAGNOSIS — R079 Chest pain, unspecified: Secondary | ICD-10-CM

## 2012-10-06 DIAGNOSIS — G2581 Restless legs syndrome: Secondary | ICD-10-CM | POA: Diagnosis present

## 2012-10-06 DIAGNOSIS — E8729 Other acidosis: Secondary | ICD-10-CM

## 2012-10-06 DIAGNOSIS — Z800 Family history of malignant neoplasm of digestive organs: Secondary | ICD-10-CM

## 2012-10-06 DIAGNOSIS — R809 Proteinuria, unspecified: Secondary | ICD-10-CM | POA: Diagnosis present

## 2012-10-06 DIAGNOSIS — D696 Thrombocytopenia, unspecified: Secondary | ICD-10-CM | POA: Diagnosis present

## 2012-10-06 DIAGNOSIS — E119 Type 2 diabetes mellitus without complications: Secondary | ICD-10-CM

## 2012-10-06 DIAGNOSIS — E860 Dehydration: Secondary | ICD-10-CM

## 2012-10-06 DIAGNOSIS — I509 Heart failure, unspecified: Secondary | ICD-10-CM | POA: Diagnosis present

## 2012-10-06 DIAGNOSIS — Z8249 Family history of ischemic heart disease and other diseases of the circulatory system: Secondary | ICD-10-CM

## 2012-10-06 DIAGNOSIS — N2581 Secondary hyperparathyroidism of renal origin: Secondary | ICD-10-CM | POA: Diagnosis present

## 2012-10-06 DIAGNOSIS — N189 Chronic kidney disease, unspecified: Secondary | ICD-10-CM

## 2012-10-06 DIAGNOSIS — IMO0002 Reserved for concepts with insufficient information to code with codable children: Secondary | ICD-10-CM

## 2012-10-06 DIAGNOSIS — F411 Generalized anxiety disorder: Secondary | ICD-10-CM | POA: Diagnosis present

## 2012-10-06 DIAGNOSIS — L02419 Cutaneous abscess of limb, unspecified: Secondary | ICD-10-CM | POA: Diagnosis present

## 2012-10-06 DIAGNOSIS — E872 Acidosis, unspecified: Secondary | ICD-10-CM | POA: Diagnosis present

## 2012-10-06 DIAGNOSIS — E875 Hyperkalemia: Secondary | ICD-10-CM

## 2012-10-06 DIAGNOSIS — I129 Hypertensive chronic kidney disease with stage 1 through stage 4 chronic kidney disease, or unspecified chronic kidney disease: Secondary | ICD-10-CM | POA: Diagnosis present

## 2012-10-06 DIAGNOSIS — N184 Chronic kidney disease, stage 4 (severe): Secondary | ICD-10-CM | POA: Diagnosis present

## 2012-10-06 DIAGNOSIS — Z6841 Body Mass Index (BMI) 40.0 and over, adult: Secondary | ICD-10-CM

## 2012-10-06 DIAGNOSIS — E1165 Type 2 diabetes mellitus with hyperglycemia: Secondary | ICD-10-CM | POA: Diagnosis present

## 2012-10-06 DIAGNOSIS — D509 Iron deficiency anemia, unspecified: Secondary | ICD-10-CM | POA: Diagnosis present

## 2012-10-06 DIAGNOSIS — N179 Acute kidney failure, unspecified: Principal | ICD-10-CM | POA: Diagnosis present

## 2012-10-06 DIAGNOSIS — M549 Dorsalgia, unspecified: Secondary | ICD-10-CM

## 2012-10-06 DIAGNOSIS — IMO0001 Reserved for inherently not codable concepts without codable children: Secondary | ICD-10-CM | POA: Diagnosis present

## 2012-10-06 DIAGNOSIS — I252 Old myocardial infarction: Secondary | ICD-10-CM

## 2012-10-06 DIAGNOSIS — D631 Anemia in chronic kidney disease: Secondary | ICD-10-CM | POA: Diagnosis present

## 2012-10-06 DIAGNOSIS — E1149 Type 2 diabetes mellitus with other diabetic neurological complication: Secondary | ICD-10-CM | POA: Diagnosis present

## 2012-10-06 DIAGNOSIS — E559 Vitamin D deficiency, unspecified: Secondary | ICD-10-CM | POA: Diagnosis present

## 2012-10-06 DIAGNOSIS — D649 Anemia, unspecified: Secondary | ICD-10-CM

## 2012-10-06 LAB — CBC WITH DIFFERENTIAL/PLATELET
HCT: 29.3 % — ABNORMAL LOW (ref 36.0–46.0)
Hemoglobin: 9.5 g/dL — ABNORMAL LOW (ref 12.0–15.0)
Lymphocytes Relative: 38 % (ref 12–46)
Lymphs Abs: 3.2 10*3/uL (ref 0.7–4.0)
Monocytes Relative: 7 % (ref 3–12)
Neutro Abs: 4.4 10*3/uL (ref 1.7–7.7)
Neutrophils Relative %: 53 % (ref 43–77)
RBC: 3.3 MIL/uL — ABNORMAL LOW (ref 3.87–5.11)

## 2012-10-06 LAB — BASIC METABOLIC PANEL
Calcium: 8.6 mg/dL (ref 8.4–10.5)
Creatinine, Ser: 3.11 mg/dL — ABNORMAL HIGH (ref 0.50–1.10)
GFR calc Af Amer: 16 mL/min — ABNORMAL LOW (ref >90)
GFR calc Af Amer: 16 mL/min — ABNORMAL LOW (ref >90)
GFR calc non Af Amer: 14 mL/min — ABNORMAL LOW (ref >90)
Potassium: 4.9 mEq/L (ref 3.5–5.1)
Sodium: 142 mEq/L (ref 135–145)

## 2012-10-06 LAB — GLUCOSE, CAPILLARY: Glucose-Capillary: 124 mg/dL — ABNORMAL HIGH (ref 70–99)

## 2012-10-06 LAB — TROPONIN I: Troponin I: <0.3 ng/mL (ref <0.30)

## 2012-10-06 MED ORDER — DOXYCYCLINE HYCLATE 100 MG PO TABS
100.0000 mg | ORAL_TABLET | Freq: Two times a day (BID) | ORAL | Status: DC
  Administered 2012-10-06 – 2012-10-10 (×8): 100 mg via ORAL
  Filled 2012-10-06 (×8): qty 1

## 2012-10-06 MED ORDER — ISOSORBIDE MONONITRATE ER 60 MG PO TB24
30.0000 mg | ORAL_TABLET | Freq: Every day | ORAL | Status: DC
  Administered 2012-10-07 – 2012-10-10 (×4): 30 mg via ORAL
  Filled 2012-10-06: qty 1
  Filled 2012-10-06: qty 2
  Filled 2012-10-06 (×2): qty 1

## 2012-10-06 MED ORDER — SODIUM POLYSTYRENE SULFONATE 15 GM/60ML PO SUSP
15.0000 g | Freq: Once | ORAL | Status: AC
  Administered 2012-10-06: 15 g via ORAL
  Filled 2012-10-06: qty 60

## 2012-10-06 MED ORDER — SODIUM POLYSTYRENE SULFONATE 15 GM/60ML PO SUSP
30.0000 g | Freq: Once | ORAL | Status: AC
  Administered 2012-10-06: 30 g via ORAL
  Filled 2012-10-06: qty 120

## 2012-10-06 MED ORDER — ONDANSETRON HCL 4 MG PO TABS: 4.0000 mg | ORAL_TABLET | Freq: Four times a day (QID) | ORAL | Status: DC | PRN

## 2012-10-06 MED ORDER — PREGABALIN 50 MG PO CAPS
50.0000 mg | ORAL_CAPSULE | Freq: Two times a day (BID) | ORAL | Status: DC
  Administered 2012-10-06 – 2012-10-10 (×8): 50 mg via ORAL
  Filled 2012-10-06 (×8): qty 1

## 2012-10-06 MED ORDER — STERILE WATER FOR INJECTION IV SOLN
INTRAVENOUS | Status: DC
  Administered 2012-10-06: 18:00:00 via INTRAVENOUS
  Filled 2012-10-06 (×4): qty 850

## 2012-10-06 MED ORDER — FUROSEMIDE 10 MG/ML IJ SOLN
60.0000 mg | Freq: Two times a day (BID) | INTRAMUSCULAR | Status: DC
  Administered 2012-10-06 – 2012-10-10 (×8): 60 mg via INTRAVENOUS
  Filled 2012-10-06 (×8): qty 6

## 2012-10-06 MED ORDER — SODIUM BICARBONATE 8.4 % IV SOLN
INTRAVENOUS | Status: AC
  Filled 2012-10-06: qty 150

## 2012-10-06 MED ORDER — SODIUM POLYSTYRENE SULFONATE 15 GM/60ML PO SUSP
30.0000 g | Freq: Once | ORAL | Status: DC
  Filled 2012-10-06: qty 120

## 2012-10-06 MED ORDER — INSULIN ASPART 100 UNIT/ML ~~LOC~~ SOLN
0.0000 [IU] | Freq: Three times a day (TID) | SUBCUTANEOUS | Status: DC
  Administered 2012-10-06 – 2012-10-07 (×2): 2 [IU] via SUBCUTANEOUS
  Administered 2012-10-07: 1 [IU] via SUBCUTANEOUS

## 2012-10-06 MED ORDER — NITROGLYCERIN 0.4 MG SL SUBL: 0.4000 mg | SUBLINGUAL_TABLET | SUBLINGUAL | Status: DC | PRN

## 2012-10-06 MED ORDER — ONDANSETRON HCL 4 MG/2ML IJ SOLN: 4.0000 mg | Freq: Four times a day (QID) | INTRAMUSCULAR | Status: DC | PRN

## 2012-10-06 MED ORDER — CARVEDILOL 3.125 MG PO TABS
6.2500 mg | ORAL_TABLET | Freq: Two times a day (BID) | ORAL | Status: DC
  Administered 2012-10-06 – 2012-10-10 (×8): 6.25 mg via ORAL
  Filled 2012-10-06 (×8): qty 2

## 2012-10-06 MED ORDER — DOCUSATE SODIUM 100 MG PO CAPS
100.0000 mg | ORAL_CAPSULE | Freq: Two times a day (BID) | ORAL | Status: DC
  Administered 2012-10-06 – 2012-10-10 (×8): 100 mg via ORAL
  Filled 2012-10-06 (×8): qty 1

## 2012-10-06 MED ORDER — ACETAMINOPHEN 650 MG RE SUPP: 650.0000 mg | Freq: Four times a day (QID) | RECTAL | Status: DC | PRN

## 2012-10-06 MED ORDER — PANTOPRAZOLE SODIUM 40 MG PO TBEC
40.0000 mg | DELAYED_RELEASE_TABLET | Freq: Every day | ORAL | Status: DC
  Administered 2012-10-07 – 2012-10-10 (×4): 40 mg via ORAL
  Filled 2012-10-06 (×4): qty 1

## 2012-10-06 MED ORDER — HEPARIN SODIUM (PORCINE) 5000 UNIT/ML IJ SOLN
5000.0000 [IU] | Freq: Three times a day (TID) | INTRAMUSCULAR | Status: DC
  Administered 2012-10-06 – 2012-10-08 (×5): 5000 [IU] via SUBCUTANEOUS
  Filled 2012-10-06 (×5): qty 1

## 2012-10-06 MED ORDER — ACETAMINOPHEN 325 MG PO TABS
650.0000 mg | ORAL_TABLET | Freq: Four times a day (QID) | ORAL | Status: DC | PRN
  Administered 2012-10-07 – 2012-10-08 (×4): 650 mg via ORAL
  Filled 2012-10-06 (×4): qty 2

## 2012-10-06 MED ORDER — SODIUM CHLORIDE 0.9 % IJ SOLN
3.0000 mL | Freq: Two times a day (BID) | INTRAMUSCULAR | Status: DC
  Administered 2012-10-07 – 2012-10-09 (×2): 3 mL via INTRAVENOUS

## 2012-10-06 NOTE — Consult Note (Signed)
Reason for Consult: Acute on chronic renal failure Referring Physician: Dr. Lorre Munroe is an 72 y.o. female.  HPI: She is a patient with long-standing history of diabetes, hypertension chronic renal failure stage III since 2012 presently came because of exertional dyspnea. According the patient when she goes to the kitchen or go to the bathroom she starts feeling weak and tired with some difficulty breathing. She is on and off chest pain and also back pain. She is also difficulty in breathing when she's lying down but that has been there for some time. Patient denies any nausea or vomiting. She has intermittently pain and she has been taking some diuretics.  Past Medical History  Diagnosis Date  . Diabetes mellitus   . Hypertension   . Hyperlipemia   . Fibromyalgia   . Obesity   . Sleep apnea     No CPAP  . Anxiety   . Hiatal hernia   . NSTEMI (non-ST elevated myocardial infarction) 04/27/2011  . Anemia 04/27/2011  . Restless leg syndrome   . CAD (coronary artery disease)     small dominant right coronary artery with diffuse 95% stenosis. The LAD had 70% stenosis in the mid vessel beyond the diagonal branch but was a small vessel. Circumflex had proximal 30% stenosis.   . Renal insufficiency 05/06/2011  . External hemorrhoids   . Arrhythmia 10/11/2011  . Community acquired pneumonia 06/24/2012    Past Surgical History  Procedure Laterality Date  . Appendectomy    . Fracture surgery      left humerous  . Ganglion cyst removal      Family History  Problem Relation Age of Onset  . Stomach cancer Mother   . Heart disease Maternal Grandmother   . Heart disease Maternal Grandfather     Social History:  reports that she has never smoked. She has never used smokeless tobacco. She reports that she does not drink alcohol or use illicit drugs.  Allergies:  Allergies  Allergen Reactions  . Codeine Other (See Comments)    Childhood allergy    Medications: I have reviewed  the patient's current medications.  Results for orders placed during the hospital encounter of 10/06/12 (from the past 48 hour(s))  CBC WITH DIFFERENTIAL     Status: Abnormal   Collection Time    10/06/12 12:02 PM      Result Value Range   WBC 8.3  4.0 - 10.5 K/uL   RBC 3.30 (*) 3.87 - 5.11 MIL/uL   Hemoglobin 9.5 (*) 12.0 - 15.0 g/dL   HCT 08.6 (*) 57.8 - 46.9 %   MCV 88.8  78.0 - 100.0 fL   MCH 28.8  26.0 - 34.0 pg   MCHC 32.4  30.0 - 36.0 g/dL   RDW 62.9 (*) 52.8 - 41.3 %   Platelets 138 (*) 150 - 400 K/uL   Neutrophils Relative 53  43 - 77 %   Neutro Abs 4.4  1.7 - 7.7 K/uL   Lymphocytes Relative 38  12 - 46 %   Lymphs Abs 3.2  0.7 - 4.0 K/uL   Monocytes Relative 7  3 - 12 %   Monocytes Absolute 0.5  0.1 - 1.0 K/uL   Eosinophils Relative 3  0 - 5 %   Eosinophils Absolute 0.2  0.0 - 0.7 K/uL   Basophils Relative 0  0 - 1 %   Basophils Absolute 0.0  0.0 - 0.1 K/uL  BASIC METABOLIC PANEL  Status: Abnormal   Collection Time    10/06/12 12:02 PM      Result Value Range   Sodium 138  135 - 145 mEq/L   Potassium 6.2 (*) 3.5 - 5.1 mEq/L   Chloride 115 (*) 96 - 112 mEq/L   CO2 12 (*) 19 - 32 mEq/L   Glucose, Bld 322 (*) 70 - 99 mg/dL   BUN 638 (*) 6 - 23 mg/dL   Creatinine, Ser 7.56 (*) 0.50 - 1.10 mg/dL   Calcium 8.6  8.4 - 43.3 mg/dL   GFR calc non Af Amer 14 (*) >90 mL/min   GFR calc Af Amer 16 (*) >90 mL/min   Comment:            The eGFR has been calculated     using the CKD EPI equation.     This calculation has not been     validated in all clinical     situations.     eGFR's persistently     <90 mL/min signify     possible Chronic Kidney Disease.  TROPONIN I     Status: None   Collection Time    10/06/12 12:02 PM      Result Value Range   Troponin I <0.30  <0.30 ng/mL   Comment:            Due to the release kinetics of cTnI,     a negative result within the first hours     of the onset of symptoms does not rule out     myocardial infarction with  certainty.     If myocardial infarction is still suspected,     repeat the test at appropriate intervals.    Dg Chest Portable 1 View  10/06/2012  *RADIOLOGY REPORT*  Clinical Data: Chest pain  PORTABLE CHEST - 1 VIEW  Comparison: 06/24/2012  Findings: Borderline cardiomegaly.  No acute infiltrate or pulmonary edema.  Bony thorax is stable.  IMPRESSION: Borderline cardiomegaly.  No active disease.   Original Report Authenticated By: Natasha Mead, M.D.     Review of Systems  Respiratory: Positive for cough and shortness of breath.   Cardiovascular: Positive for chest pain.  Gastrointestinal: Negative for nausea, vomiting, abdominal pain and diarrhea.  Neurological: Positive for weakness.   Blood pressure 116/72, pulse 64, temperature 97.7 F (36.5 C), temperature source Oral, resp. rate 22, height 5\' 3"  (1.6 m), weight 109.3 kg (240 lb 15.4 oz), SpO2 96.00%. Physical Exam  Eyes: No scleral icterus.  Neck: No JVD present.  Cardiovascular: Normal rate and regular rhythm.   No murmur heard. Respiratory: She has no wheezes. She has no rales.  GI: She exhibits no distension. There is no tenderness.  Musculoskeletal: She exhibits no edema.  She has a small abrasion in the anterior side of her right leg. According to the patient that has improved after she was put on antibiotics for infection of her legs.    Assessment/Plan: Problem #1 acute kidney injury superimposed on chronic. The etiology not clear probably prerenal however interstitial nephritis and also renal failure associated with ACE inhibitor cannot ruled out. Presently patient doesn't have any uremic sinus symptoms. Problem #2 chronic renal failure. Her creatinine December 2012 was 1.3 and has increased to 1.95 January of this year. Patient has ultrasound scan were right kidney 11 and left kidney 11.2 cm. She has also proteinuria. Hence patient with underlying chronic renal failure stage III at least for the last 2 years. Problem #  3  proteinuria she has 300 mg deciliter of protein on UA which was done last year. This could be secondary to diabetes however other cause of proteinuria at this moment cannot ruled out. Problem #4 hypertension her blood pressure seems to be reasonably controlled Problem #5 diabetes seems to be poorly controlled Problem #6 exertional dyspnea patient with history of coronary artery disease and also sleep apnea. 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 Problem #8 sleep apnea Problem #9 anemia Problem #10 cellulitis of her right leg seems to have improved since she has some small abrasion with is no drainage. Plan: We'll collect 24-hour urine for protein and immunoelectrophoresis. We'll check ANA, complement, hepatitis B surface antigen, hepatitis C antibody. We'll increase her IV fluid to 135 cc per hour We'll start her on Lasix 60 mg IV twice a day to improve her urine output and also possibly control her potassium better. We'll check her basic metabolic panel, iron studies, phosphorus and intact PTH in the morning.   Dallana Mavity S 10/06/2012, 4:37 PM

## 2012-10-06 NOTE — ED Notes (Signed)
Pt c/o chest pain and SOB since Wednesday.  Reports aching all over.  PT started a 24 hour urine this morning around 1am.

## 2012-10-06 NOTE — ED Notes (Signed)
States that she fell about 1 month ago and landed on her left side.  States that she fell down the steps.  States that her PCP did see her and took xrays, the patient states that he did not see any injuries.

## 2012-10-06 NOTE — ED Notes (Signed)
States that she has restless leg and wants to take her "pantoprazole" for it, listed on her medications list this way, MD made aware.

## 2012-10-06 NOTE — ED Provider Notes (Signed)
History  This chart was scribed for Vida Roller, MD by Shari Heritage, ED Scribe. The patient was seen in room APA19/[facility]19. Patient's care was started at 1201.   CSN: [id number]  Arrival date & time 10/06/12  1155   First MD Initiated Contact with Patient 10/06/12 1201      Chief Complaint  Patient presents with  . Chest Pain     The history is provided by the patient. No language interpreter was used.    HPI Comments: [name] is a 72 y.o. female with history of coronary artery disease, diabetes, hypertension, NSTEMI, stroke who presents to the Emergency Department complaining of intermittent, sharp left upper chest and left upper back pain onset 1 week ago. There is associated shortness of breath. Pain is worse with ambulation. Patient denies nausea, vomiting, fever, leg swelling, diarrhea, abdominal pain or any other symptoms at this time. Other medical history includes hiatal hernia, renal insufficiency and arrythmia. She has no history of cardiac stents. She has no history of COPD or emphysema. She has a surgical history of appendectomy. Patient has never smoked. She lives in [place] with her three sons.  Cardiology - [place]  Past Medical History  Diagnosis Date  . Diabetes mellitus   . Hypertension   . Hyperlipemia   . Fibromyalgia   . Obesity   . Sleep apnea     No CPAP  . Anxiety   . Hiatal hernia   . NSTEMI (non-ST elevated myocardial infarction) 04/27/2011  . Anemia 04/27/2011  . Restless leg syndrome   . CAD (coronary artery disease)     small dominant right coronary artery with diffuse 95% stenosis. The LAD had 70% stenosis in the mid vessel beyond the diagonal branch but was a small vessel. Circumflex had proximal 30% stenosis.   . Renal insufficiency 05/06/2011  . External hemorrhoids   . Arrhythmia 10/11/2011  . Community acquired pneumonia 06/24/2012    Past Surgical History  Procedure Laterality Date  . Appendectomy    . Fracture surgery      left  humerous  . Ganglion cyst removal      Family History  Problem Relation Age of Onset  . Stomach cancer Mother   . Heart disease Maternal Grandmother   . Heart disease Maternal Grandfather     History  Substance Use Topics  . Smoking status: Never Smoker   . Smokeless tobacco: Never Used  . Alcohol Use: No    OB History   Grav Para Term Preterm Abortions TAB SAB Ect Mult Living                  Review of Systems A complete 10 system review of systems was obtained and all systems are negative except as noted in the HPI and PMH.   Allergies  Codeine  Home Medications   Current Outpatient Rx  Name  Route  Sig  Dispense  Refill  . carvedilol (COREG) 6.25 MG tablet   Oral   Take 6.25 mg by mouth 2 (two) times daily with a meal.         . cetirizine (ZYRTEC) 10 MG tablet   Oral   Take 10 mg by mouth 2 (two) times daily.         . Cyanocobalamin 500 MCG SUBL   Sublingual   Place 1 tablet under the tongue 2 (two) times daily.         Marland Kitchen docusate sodium (COLACE) 100 MG capsule  Oral   Take 100 mg by mouth 2 (two) times daily.          Marland Kitchen doxycycline (MONODOX) 100 MG capsule   Oral   Take 100 mg by mouth 2 (two) times daily.         . Ferrous Sulfate 27 MG TABS   Oral   Take 1 tablet by mouth 2 (two) times daily.         . fluocinonide cream (LIDEX) 0.05 %   Topical   Apply 1 application topically 2 (two) times daily.         . furosemide (LASIX) 20 MG tablet   Oral   Take 20 mg by mouth daily.         Marland Kitchen glimepiride (AMARYL) 4 MG tablet   Oral   Take 4 mg by mouth 2 (two) times daily.          Marland Kitchen guaiFENesin-dextromethorphan (ROBITUSSIN DM) 100-10 MG/5ML syrup   Oral   Take 5 mLs by mouth every 4 (four) hours as needed for cough.   240 mL   0   . isosorbide mononitrate (IMDUR) 30 MG 24 hr tablet   Oral   Take 30 mg by mouth daily.         Marland Kitchen lisinopril (PRINIVIL,ZESTRIL) 20 MG tablet   Oral   Take 20 mg by mouth daily.          . nitroGLYCERIN (NITROSTAT) 0.4 MG SL tablet   Sublingual   Place 1 mg under the tongue every 5 (five) minutes x 3 doses as needed for chest pain. For chest pain         . pantoprazole (PROTONIX) 40 MG tablet   Oral   Take 40 mg by mouth daily.         . pregabalin (LYRICA) 50 MG capsule   Oral   Take 50 mg by mouth 2 (two) times daily.          . rosuvastatin (CRESTOR) 20 MG tablet   Oral   Take 20 mg by mouth at bedtime.             Triage Vitals: BP 99/64  Pulse 53  Resp 22  Ht 5\' 3"  (1.6 m)  Wt 238 lb (107.956 kg)  BMI 42.17 kg/m2  SpO2 98%  Physical Exam  Constitutional: She is oriented to person, place, and time. She appears well-developed and well-nourished. No distress.  Appears uncomfortable.  HENT:  Head: Normocephalic and atraumatic.  Mouth/Throat: Oropharynx is clear and moist.  Eyes: Conjunctivae and EOM are normal. Pupils are equal, round, and reactive to light.  Neck: Normal range of motion. Neck supple. No tracheal deviation present.  Cardiovascular: Regular rhythm and normal heart sounds.  Bradycardia present.   No murmur heard. Sinus bradycardia.   Pulmonary/Chest: Effort normal and breath sounds normal. No respiratory distress. She has no wheezes. She has no rales.  Abdominal: Soft. She exhibits no distension and no mass. There is no tenderness. There is no rebound and no guarding.  Musculoskeletal: Normal range of motion.  No lower extremity edema. No asymmetry. No redness to the skin.  Lymphadenopathy:    She has no cervical adenopathy.  Neurological: She is alert and oriented to person, place, and time.  Skin: Skin is warm and dry.    ED Course  Procedures (including critical care time) DIAGNOSTIC STUDIES: Oxygen Saturation is 98% on room air, normal by my interpretation.    COORDINATION OF CARE:  12:18 PM- Patient informed of current plan for treatment and evaluation and agrees with plan at this time.      Labs Reviewed  CBC  WITH DIFFERENTIAL - Abnormal; Notable for the following:    RBC 3.30 (*)    Hemoglobin 9.5 (*)    HCT 29.3 (*)    RDW 16.1 (*)    Platelets 138 (*)    All other components within normal limits  BASIC METABOLIC PANEL - Abnormal; Notable for the following:    Potassium 6.2 (*)    Chloride 115 (*)    CO2 12 (*)    Glucose, Bld 322 (*)    BUN 110 (*)    Creatinine, Ser 3.11 (*)    GFR calc non Af Amer 14 (*)    GFR calc Af Amer 16 (*)    All other components within normal limits  TROPONIN I    Dg Chest Portable 1 View  10/06/2012  *RADIOLOGY REPORT*  Clinical Data: Chest pain  PORTABLE CHEST - 1 VIEW  Comparison: 06/24/2012  Findings: Borderline cardiomegaly.  No acute infiltrate or pulmonary edema.  Bony thorax is stable.  IMPRESSION: Borderline cardiomegaly.  No active disease.   Original Report Authenticated By: Natasha Mead, M.D.      1. Acute on chronic renal failure   2. Hyperkalemia   3. Dehydration   4. Chest pain   5. Back pain   6. Anemia       MDM  The pt is high risk for coronary artery disease but has no acute findings on her ECG for ischemia.  She has somewhat reproducible tenderness to palpation of the L upper back but not chest.  She is bradycardic and no signs of tachycardia, hypoxia.  She denies any inciting events for her pain initially but does state that since onset it is exertional as well as having exertional dyspnea.   ED ECG REPORT  I personally interpreted this EKG   Date: 10/06/2012   Rate: 59  Rhythm: sinus bradycardia  QRS Axis: normal  Intervals: normal  ST/T Wave abnormalities: normal  Conduction Disutrbances:right bundle branch block  Narrative Interpretation:   Old EKG Reviewed: c/w 06/24/12, no sig changes  At this time the patient's laboratory data is significantly abnormal with acute on chronic renal failure, significant uremia with a BUN of 110, hyperkalemia at 6.2. Her EKG does not show any signs of hyperkalemia. She will be given  Kayexalate, IV fluids and admitted to the hospital for ongoing treatment of acute on chronic renal failure.  I have personally evaluated and interpreted her portable chest x-ray and find her to be no signs of pneumonia, pneumothorax or abnormal mediastinum. She does have cardiomegaly.  D/w Dr. Irene Limbo who will evaluate for admission.   I personally performed the services described in this documentation, which was scribed in my presence. The recorded information has been reviewed and is accurate.      Vida Roller, MD 10/06/12 1434

## 2012-10-06 NOTE — H&P (Signed)
History and Physical  [name] AVW:[id number] DOB: 06-Sep-1940 DOA: 10/06/2012  Referring physician: Eber Hong, MD PCP: Josue Hector, MD  Endocrinologist: Margaretmary Bayley, M.D. Nephrologist: Dr. Kristian Covey  Cardiologist: Angelina Sheriff, M.D.  Chief Complaint: Short of breath  HPI:  72 year old woman presents to the emergency department with complaint of shortness of breath and back pain.On initial evaluation from a cardiopulmonary perspective was unremarkable however blood work revealed hyperkalemia, metabolic acidosis and acute renal failure.  History obtained from chart review, patient and sons at bedside. I note that the ER record from nurse and EDP chest pain. However the patient denies chest pain to me and minimizes her back pain. Patient reports the last 2 weeks she has had increasing dyspnea on exertion without chest pain. Because of increasing dyspnea on exertion she came to the emergency department today. She has chronic stable angina which is unchanged for which she takes intermittent nitroglycerin. She has been told by her primary care physician and her endocrinologist that she has "failing kidneys". She was seen by Dr. Kristian Covey 08/2012 and was instructed to begin a 24-hour urine test this morning in anticipation of follow up with him in 2 weeks. She reports normal urination and oral intake. She brings copies of lab work obtained by Dr. Chestine Spore 4/16 as follows: Potassium 5.5, BUN 71, creatinine 2.36, hemoglobin A1c 7.5. She reports taking Lasix twice a day. She reports a fall sometime ago with some resultant back pain, but does not complaining significantly of it during the interview.  In the emergency department she was noted be afebrile, borderline bradycardic and mildly hypertensive. No hypoxia. Potassium 6.2, bicarbonate 12, BUN 110, creatinine 3.11. Glucose 322. Chest x-ray was unremarkable and EKG without significant change from prior, specifically no changes secondary to  hyperkalemia.  Review of Systems:  Negative for fever, visual changes, sore throat, rash, new muscle aches, chest pain, dysuria, bleeding, n/v/abdominal pain.  Past Medical History  Diagnosis Date  . Diabetes mellitus   . Hypertension   . Hyperlipemia   . Fibromyalgia   . Obesity   . Sleep apnea     No CPAP  . Anxiety   . Hiatal hernia   . NSTEMI (non-ST elevated myocardial infarction) 04/27/2011  . Anemia 04/27/2011  . Restless leg syndrome   . CAD (coronary artery disease)     small dominant right coronary artery with diffuse 95% stenosis. The LAD had 70% stenosis in the mid vessel beyond the diagonal branch but was a small vessel. Circumflex had proximal 30% stenosis.   . Renal insufficiency 05/06/2011  . External hemorrhoids   . Arrhythmia 10/11/2011  . Community acquired pneumonia 06/24/2012    Past Surgical History  Procedure Laterality Date  . Appendectomy    . Fracture surgery      left humerous  . Ganglion cyst removal      Social History:  reports that she has never smoked. She has never used smokeless tobacco. She reports that she does not drink alcohol or use illicit drugs.  Allergies  Allergen Reactions  . Codeine Other (See Comments)    Childhood allergy    Family History  Problem Relation Age of Onset  . Stomach cancer Mother   . Heart disease Maternal Grandmother   . Heart disease Maternal Grandfather      Prior to Admission medications   Medication Sig Start Date End Date Taking? Authorizing Provider  carvedilol (COREG) 6.25 MG tablet Take 6.25 mg by mouth 2 (two) times daily with  a meal.   Yes Historical Provider, MD  cetirizine (ZYRTEC) 10 MG tablet Take 10 mg by mouth 2 (two) times daily.   Yes Historical Provider, MD  Cyanocobalamin 500 MCG SUBL Place 1 tablet under the tongue 2 (two) times daily.   Yes Historical Provider, MD  docusate sodium (COLACE) 100 MG capsule Take 100 mg by mouth 2 (two) times daily.    Yes Historical Provider, MD   doxycycline (MONODOX) 100 MG capsule Take 100 mg by mouth 2 (two) times daily.   Yes Historical Provider, MD  Ferrous Sulfate 27 MG TABS Take 1 tablet by mouth 2 (two) times daily.   Yes Historical Provider, MD  fluocinonide cream (LIDEX) 0.05 % Apply 1 application topically 2 (two) times daily.   Yes Historical Provider, MD  furosemide (LASIX) 20 MG tablet Take 20 mg by mouth daily.   Yes Historical Provider, MD  glimepiride (AMARYL) 4 MG tablet Take 4 mg by mouth 2 (two) times daily.    Yes Historical Provider, MD  guaiFENesin-dextromethorphan (ROBITUSSIN DM) 100-10 MG/5ML syrup Take 5 mLs by mouth every 4 (four) hours as needed for cough. 06/29/12  Yes Richarda Overlie, MD  isosorbide mononitrate (IMDUR) 30 MG 24 hr tablet Take 30 mg by mouth daily.   Yes Historical Provider, MD  lisinopril (PRINIVIL,ZESTRIL) 20 MG tablet Take 20 mg by mouth daily.   Yes Historical Provider, MD  nitroGLYCERIN (NITROSTAT) 0.4 MG SL tablet Place 1 mg under the tongue every 5 (five) minutes x 3 doses as needed for chest pain. For chest pain   Yes Historical Provider, MD  pantoprazole (PROTONIX) 40 MG tablet Take 40 mg by mouth daily.   Yes Historical Provider, MD  pregabalin (LYRICA) 50 MG capsule Take 50 mg by mouth 2 (two) times daily.    Yes Historical Provider, MD  rosuvastatin (CRESTOR) 20 MG tablet Take 20 mg by mouth at bedtime.     Yes Historical Provider, MD   Physical Exam: Filed Vitals:   10/06/12 1202 10/06/12 1205 10/06/12 1246 10/06/12 1459  BP:  99/64 158/54 156/44  Pulse:  53 56 63  Temp:    97.4 F (36.3 C)  TempSrc:    Oral  Resp:  22 22 22   Height: 5\' 3"  (1.6 m)     Weight: 107.956 kg (238 lb)     SpO2:  98% 99% 97%    General: Examined in the emergency department. Appears calm and comfortable.  Eyes: PERRL, normal lids, irises. Wears glasses. ENT: grossly normal hearing, lips  Neck: no LAD, masses or thyromegaly Cardiovascular: RRR, no m/r/g. No LE edema. Back: Appears  unremarkable. Respiratory: CTA bilaterally, no w/r/r. Normal respiratory effort. Abdomen: soft, ntnd, obese Skin: Small abrasion with minimal surrounding erythema right lower distal extremity Musculoskeletal: grossly normal tone BUE/BLE Psychiatric: grossly normal mood and affect, speech fluent and appropriate Neurologic: grossly non-focal.  Wt Readings from Last 3 Encounters:  10/06/12 107.956 kg (238 lb)  06/28/12 108.954 kg (240 lb 3.2 oz)  01/31/12 107.049 kg (236 lb)    Labs on Admission:  Basic Metabolic Panel:  Recent Labs Lab 10/06/12 1202  NA 138  K 6.2*  CL 115*  CO2 12*  GLUCOSE 322*  BUN 110*  CREATININE 3.11*  CALCIUM 8.6   CBC:  Recent Labs Lab 10/06/12 1202  WBC 8.3  NEUTROABS 4.4  HGB 9.5*  HCT 29.3*  MCV 88.8  PLT 138*    Cardiac Enzymes:  Recent Labs Lab 10/06/12 1202  TROPONINI <0.30   Radiological Exams on Admission: Dg Chest Portable 1 View  10/06/2012  *RADIOLOGY REPORT*  Clinical Data: Chest pain  PORTABLE CHEST - 1 VIEW  Comparison: 06/24/2012  Findings: Borderline cardiomegaly.  No acute infiltrate or pulmonary edema.  Bony thorax is stable.  IMPRESSION: Borderline cardiomegaly.  No active disease.   Original Report Authenticated By: Natasha Mead, M.D.     EKG: Independently reviewed. Sinus rhythm, right bundle branch block, no acute changes. Compared to previous study on/20/2014 right bundle branch block is old.  Patient's been followed by Dr. Antoine Poche and there was concern for possible junctional rhythm. Holter test was performed 2013 which demonstrated sinus rhythm with atrial tachycardia. No sustained dysrhythmias.  Principal Problem:   ARF (acute renal failure) Active Problems:   Anemia   CAD (coronary artery disease)   CKD (chronic kidney disease), stage IV   Morbid obesity   Hyperkalemia   High anion gap metabolic acidosis   DM type 2, uncontrolled, with neuropathy   Assessment/Plan 72 year old woman with chronic  kidney disease stage IV presented with worsening renal failure, hyperkalemia and metabolic acidosis.  1. Acute on chronic renal failure: Nonoliguric by history. Likely secondary to Lasix and complicated by lisinopril and progressive chronic kidney disease. Gentle IV fluids. Hold offending medications. Nephrology consultation in the morning. 2. Hyperkalemia: Secondary to acute renal failure, possibly complicated by lisinopril. No EKG changes or arrhythmias noted. Status post Kayexalate. IV fluids. Recheck in the morning.  3. Anion gap metabolic acidosis:Likely secondary to uremia. IV fluids. Follow clinically.  4. Anemia: Stable. Likely secondary to chronic disease. 5. Thrombocytopenia: Monitor. Etiology and significance unclear. 6. Diabetes mellitus type 2: Uncontrolled with hemoglobin A1c 7.5 on 09/19/2011.Marland KitchenSliding-scale insulin. Hold oral agent. May need long-acting insulin. 7. History of coronary artery disease, MI: Appears quiescent. Continue Coreg, Imdur, nitroglycerin as needed. 8. Chronic kidney disease stage IV 9. Cellulitis right lower extremity: Present on admission. Continue doxycycline. 10. Morbid obesity: Nutrition consult. 11. History of diastolic dysfunction: Appears well compensated. No lower extremity edema.   Code Status: Full code  Family Communication: Discussed with 2 sons at bedside  Disposition Plan/Anticipated LOS: Admission, 2-3 days  Time spent: 65 minutes  Brendia Sacks, MD  [facility] Pager [phone] 10/06/2012, 3:11 PM

## 2012-10-07 ENCOUNTER — Ambulatory Visit (HOSPITAL_COMMUNITY): Payer: Medicare Other | Attending: Nephrology

## 2012-10-07 DIAGNOSIS — E1142 Type 2 diabetes mellitus with diabetic polyneuropathy: Secondary | ICD-10-CM

## 2012-10-07 DIAGNOSIS — N179 Acute kidney failure, unspecified: Principal | ICD-10-CM

## 2012-10-07 DIAGNOSIS — D649 Anemia, unspecified: Secondary | ICD-10-CM

## 2012-10-07 DIAGNOSIS — N189 Chronic kidney disease, unspecified: Secondary | ICD-10-CM

## 2012-10-07 DIAGNOSIS — E1149 Type 2 diabetes mellitus with other diabetic neurological complication: Secondary | ICD-10-CM

## 2012-10-07 LAB — CBC
HCT: 28.4 % — ABNORMAL LOW (ref 36.0–46.0)
MCH: 28.8 pg (ref 26.0–34.0)
MCHC: 33.1 g/dL (ref 30.0–36.0)
MCV: 87.1 fL (ref 78.0–100.0)
RDW: 15.9 % — ABNORMAL HIGH (ref 11.5–15.5)
WBC: 8.3 10*3/uL (ref 4.0–10.5)

## 2012-10-07 LAB — FERRITIN: Ferritin: 76 ng/mL (ref 10–291)

## 2012-10-07 LAB — BASIC METABOLIC PANEL
BUN: 95 mg/dL — ABNORMAL HIGH (ref 6–23)
Calcium: 8.3 mg/dL — ABNORMAL LOW (ref 8.4–10.5)
Chloride: 113 mEq/L — ABNORMAL HIGH (ref 96–112)
Creatinine, Ser: 2.88 mg/dL — ABNORMAL HIGH (ref 0.50–1.10)
GFR calc Af Amer: 18 mL/min — ABNORMAL LOW (ref >90)

## 2012-10-07 LAB — IRON AND TIBC
Iron: 38 ug/dL — ABNORMAL LOW (ref 42–135)
Saturation Ratios: 18 % — ABNORMAL LOW (ref 20–55)
TIBC: 210 ug/dL — ABNORMAL LOW (ref 250–470)

## 2012-10-07 LAB — GLUCOSE, CAPILLARY: Glucose-Capillary: 148 mg/dL — ABNORMAL HIGH (ref 70–99)

## 2012-10-07 LAB — PROTEIN, URINE, 24 HOUR: Urine Total Volume-UPROT: 3000 mL

## 2012-10-07 LAB — PHOSPHORUS: Phosphorus: 5.2 mg/dL — ABNORMAL HIGH (ref 2.3–4.6)

## 2012-10-07 MED ORDER — SODIUM CHLORIDE 0.9 % IV SOLN
INTRAVENOUS | Status: DC
  Administered 2012-10-07 – 2012-10-10 (×7): via INTRAVENOUS

## 2012-10-07 MED ORDER — SODIUM CHLORIDE 0.9 % IV SOLN
INTRAVENOUS | Status: AC
  Administered 2012-10-07 (×2): via INTRAVENOUS

## 2012-10-07 NOTE — Progress Notes (Signed)
10/07/12 1731 Patient ambulated in room and across hallway then back to room this afternoon with standby assist. Tolerated fairly well, some general weakness. Up to chair this afternoon, instructed to call for assist and not attempt getting up on her own. Stated will call. Sister at bedside. Pt stated comfortable. Earnstine Regal, RN

## 2012-10-07 NOTE — Progress Notes (Signed)
Utilization Review Complete  

## 2012-10-07 NOTE — Progress Notes (Signed)
[facility] PROGRESS NOTE  [name]:[id number] DOB: 20-Dec-1940 DOA: 10/06/2012 PCP: Josue Hector, MD  Assessment/Plan: 1. Acute on chronic renal failure: Improving. Appreciate nephrology management. 2. Hyperkalemia: Resolved status post Kayexalate. Hopefully resume lisinopril soon. 3. Anion gap Metabolic acidosis: Improving. Continue management per nephrology. Secondary to renal failure. 4. Anemia: Stable. Presumably secondary to chronic disease from kidney disease. 5. Thrombocytopenia: Minimal. Stable. Possibly chronic. 6. Diabetes mellitus type 2 with hypoglycemia: Uncontrolled with hemoglobin A1c 7.54/16/2003. Oral medication on hold. Monitor CBG. Hold sliding scale insulin for now. 7. History of coronary artery disease: Stable. Continue Coreg, Imdur, nitroglycerin as needed. 8. Cellulitis right lower extremity: Present on admission. Resolving. Continue doxycycline. 9. Morbid obesity 10. History diastolic dysfunction: Appears well compensated.   Code Status: Full code DVT prophylaxis: Heparin Family Communication:  Disposition Plan: Home when improved.  Brendia Sacks, MD  [facility]  Pager [phone] If 7PM-7AM, please contact night-coverage at [email or web address], password [facility] 10/07/2012, 5:35 PM  LOS: 1 day   Brief narrative: 72 year old woman presents to the emergency department with complaint of shortness of breath and back pain.On initial evaluation from a cardiopulmonary perspective was unremarkable however blood work revealed hyperkalemia, metabolic acidosis and acute renal failure.  Consultants:  Nephrology  Procedures:    HPI/Subjective: Overall feels better. No shortness of breath. Hypoglycemic episode earlier today. Afebrile, vital signs stable.  Objective: Filed Vitals:   10/06/12 2100 10/07/12 0100 10/07/12 0353 10/07/12 1400  BP: 137/63 132/80 137/66 137/56  Pulse: 75 82 30 60  Temp: 98.3 F (36.8 C) 97.6 F (36.4 C) 97.6 F  (36.4 C) 97.8 F (36.6 C)  TempSrc: Oral Oral Oral Oral  Resp: 20 20 20 20   Height:      Weight:   110.2 kg (242 lb 15.2 oz)   SpO2: 98% 96% 95% 100%    Intake/Output Summary (Last 24 hours) at 10/07/12 1735 Last data filed at 10/07/12 1300  Gross per 24 hour  Intake 2728.5 ml  Output   4707 ml  Net -1978.5 ml   Filed Weights   10/06/12 1202 10/06/12 1537 10/07/12 0353  Weight: 107.956 kg (238 lb) 109.3 kg (240 lb 15.4 oz) 110.2 kg (242 lb 15.2 oz)    Exam:  General:  Appears calm and comfortable Cardiovascular: RRR, no m/r/g. No LE edema. Telemetry: SR, no arrhythmias  Respiratory: CTA bilaterally, no w/r/r. Normal respiratory effort. Psychiatric: grossly normal mood and affect, speech fluent and appropriate   Data Reviewed: Basic Metabolic Panel:  Recent Labs Lab 10/06/12 1202 10/06/12 2057 10/07/12 0513 10/07/12 0514  NA 138 142  --  141  K 6.2* 4.9  --  4.2  CL 115* 112  --  113*  CO2 12* 14*  --  17*  GLUCOSE 322* 127*  --  82  BUN 110* 102*  --  95*  CREATININE 3.11* 3.15*  --  2.88*  CALCIUM 8.6 8.9  --  8.3*  MG  --  1.9  --   --   PHOS  --   --  5.2*  --    CBC:  Recent Labs Lab 10/06/12 1202 10/07/12 0514  WBC 8.3 8.3  NEUTROABS 4.4  --   HGB 9.5* 9.4*  HCT 29.3* 28.4*  MCV 88.8 87.1  PLT 138* 142*   Cardiac Enzymes:  Recent Labs Lab 10/06/12 1202  TROPONINI <0.30   BNP (last 3 results)  Recent Labs  06/24/12 1740 10/06/12 2057  PROBNP 4638.0* 6749.0*   CBG:  Recent Labs Lab 10/06/12 2056 10/07/12 0711 10/07/12 0756 10/07/12 1109 10/07/12 1636  GLUCAP 124* 63* 149* 174* 148*      Studies: Dg Chest Portable 1 View  10/06/2012  *RADIOLOGY REPORT*  Clinical Data: Chest pain  PORTABLE CHEST - 1 VIEW  Comparison: 06/24/2012  Findings: Borderline cardiomegaly.  No acute infiltrate or pulmonary edema.  Bony thorax is stable.  IMPRESSION: Borderline cardiomegaly.  No active disease.   Original Report Authenticated By:  Natasha Mead, M.D.     Scheduled Meds: . carvedilol  6.25 mg Oral BID WC  . docusate sodium  100 mg Oral BID  . doxycycline  100 mg Oral BID  . furosemide  60 mg Intravenous Q12H  . heparin  5,000 Units Subcutaneous Q8H  . insulin aspart  0-9 Units Subcutaneous TID WC  . isosorbide mononitrate  30 mg Oral Daily  . pantoprazole  40 mg Oral Daily  . pregabalin  50 mg Oral BID  . sodium chloride  3 mL Intravenous Q12H  . sodium polystyrene  30 g Oral Once   Continuous Infusions:   Principal Problem:   ARF (acute renal failure) Active Problems:   Anemia   CAD (coronary artery disease)   CKD (chronic kidney disease), stage IV   Morbid obesity   Hyperkalemia   High anion gap metabolic acidosis   DM type 2, uncontrolled, with neuropathy     Brendia Sacks, MD  [facility] Pager [phone] If 7PM-7AM, please contact night-coverage at [email or web address], password [facility] 10/07/2012, 5:35 PM  LOS: 1 day   Time spent: 20 minutes

## 2012-10-07 NOTE — Care Management Note (Signed)
    Page 1 of 1   10/10/2012     3:38:01 PM   CARE MANAGEMENT NOTE 10/10/2012  Patient:  [name], [name]   Account Number:  [id number]  Date Initiated:  10/07/2012  Documentation initiated by:  Rosemary Holms  Subjective/Objective Assessment:   Pt admitted from home whre she lives with her son. Sister at bedside. Pt fairly independent at home. Will follow for [facility] needs.     Action/Plan:   Anticipated DC Date:  10/09/2012   Anticipated DC Plan:  HOME/SELF CARE      DC Planning Services  CM consult      Choice offered to / List presented to:             Status of service:  Completed, signed off Medicare Important Message given?  YES (If response is "NO", the following Medicare IM given date fields will be blank) Date Medicare IM given:  10/10/2012 Date Additional Medicare IM given:    Discharge Disposition:  HOME/SELF CARE  Per UR Regulation:    If discussed at Long Length of Stay Meetings, dates discussed:    Comments:  10/10/12 1537 Arlyss Queen, RN BSN CM Pt discharged home today. No CM needs noted.  10/07/12 Rosemary Holms RN  BSN CM

## 2012-10-07 NOTE — Progress Notes (Addendum)
Hypoglycemic Event  CBG: 63  Treatment: 15 GM carbohydrate snack  Symptoms: None  Follow-up CBG: Time: 0756 CBG Result: 149  Possible Reasons for Event: Unknown  Comments/MD notified: Dr Irene Limbo textpaged to notify.     [name] Joy  Remember to initiate Hypoglycemia Order Set & complete

## 2012-10-07 NOTE — Progress Notes (Signed)
Subjective: Interval History: has no complaint of nausea or vomiting. Patient also denies any difficulty breathing. Solid complaint she has is she's feeling sleepy. Presently she doesn't have any diarrhea..  Objective: Vital signs in last 24 hours: Temp:  [97.4 F (36.3 C)-98.3 F (36.8 C)] 97.6 F (36.4 C) (05/05 0353) Pulse Rate:  [30-82] 30 (05/05 0353) Resp:  [20-22] 20 (05/05 0353) BP: (99-158)/(44-80) 137/66 mmHg (05/05 0353) SpO2:  [95 %-99 %] 95 % (05/05 0353) Weight:  [107.956 kg (238 lb)-110.2 kg (242 lb 15.2 oz)] 110.2 kg (242 lb 15.2 oz) (05/05 0353) Weight change:   Intake/Output from previous day: 05/04 0701 - 05/05 0700 In: 2245.5 [P.O.:720; I.V.:1525.5] Out: 2754 [Urine:2750; Stool:4] Intake/Output this shift: Total I/O In: -  Out: 400 [Urine:400]  General appearance: alert, cooperative and no distress Resp: clear to auscultation bilaterally Cardio: regular rate and rhythm, S1, S2 normal, no murmur, click, rub or gallop GI: soft, non-tender; bowel sounds normal; no masses,  no organomegaly Extremities: extremities normal, atraumatic, no cyanosis or edema  Lab Results:  Recent Labs  10/06/12 1202 10/07/12 0514  WBC 8.3 8.3  HGB 9.5* 9.4*  HCT 29.3* 28.4*  PLT 138* 142*   BMET:  Recent Labs  10/06/12 2057 10/07/12 0514  NA 142 141  K 4.9 4.2  CL 112 113*  CO2 14* 17*  GLUCOSE 127* 82  BUN 102* 95*  CREATININE 3.15* 2.88*  CALCIUM 8.9 8.3*   No results found for this basename: PTH,  in the last 72 hours Iron Studies: No results found for this basename: IRON, TIBC, TRANSFERRIN, FERRITIN,  in the last 72 hours  Studies/Results: Dg Chest Portable 1 View  10/06/2012  *RADIOLOGY REPORT*  Clinical Data: Chest pain  PORTABLE CHEST - 1 VIEW  Comparison: 06/24/2012  Findings: Borderline cardiomegaly.  No acute infiltrate or pulmonary edema.  Bony thorax is stable.  IMPRESSION: Borderline cardiomegaly.  No active disease.   Original Report Authenticated  By: Natasha Mead, M.D.     I have reviewed the patient's current medications.  Assessment/Plan: Problem #1 acute kidney injury superimposed on chronic her BUN is 95 and creatinine is 2.88 renal function seems to be improving. Presently patient doesn't have any uremic sign and symptoms.  Problem #2 chronic renal failure stage III most likely secondary to diabetes. Problem #3 proteinuria none nephrotic range she had about 2.8 g of protein. This also could be secondary to diabetes. Problem #4 hypokalemia potassium is 4.2 improving Problem #5 low CO2 possibly metabolic he CO2 17. Problem #6 metabolic bone disease calcium is good but her phosphorus seems to be high. Presently patient is not on a binder. Problem #7 history of diabetes Problem #8 history of hypertension her blood pressure seems to be reasonably controlled. Plan: We'll continue his present management We'll follow her basic metabolic panel and phosphorus in the morning. If her phosphorus remains high we'll consider starting her on binder. Presently her PTH and vitamin D level is pending.   LOS: 1 day   Derhonda Eastlick S 10/07/2012,9:17 AM

## 2012-10-07 NOTE — Plan of Care (Signed)
Problem: Phase I Progression Outcomes Goal: OOB as tolerated unless otherwise ordered Outcome: Completed/Met Date Met:  10/07/12 10/07/12 1320 Up with assistance, instructed to call as needed. bedalarm on for safety, call light kept within reach. Sister at bedside.

## 2012-10-08 DIAGNOSIS — E872 Acidosis: Secondary | ICD-10-CM

## 2012-10-08 LAB — BASIC METABOLIC PANEL
Chloride: 109 mEq/L (ref 96–112)
GFR calc Af Amer: 17 mL/min — ABNORMAL LOW (ref >90)
GFR calc non Af Amer: 15 mL/min — ABNORMAL LOW (ref >90)
Glucose, Bld: 104 mg/dL — ABNORMAL HIGH (ref 70–99)
Potassium: 4.1 mEq/L (ref 3.5–5.1)
Sodium: 139 mEq/L (ref 135–145)

## 2012-10-08 LAB — GLUCOSE, CAPILLARY
Glucose-Capillary: 185 mg/dL — ABNORMAL HIGH (ref 70–99)
Glucose-Capillary: 97 mg/dL (ref 70–99)
Glucose-Capillary: 150 mg/dL — ABNORMAL HIGH (ref 70–99)

## 2012-10-08 LAB — PTH, INTACT AND CALCIUM
Calcium, Total (PTH): 8.2 mg/dL — ABNORMAL LOW (ref 8.4–10.5)
PTH: 166.2 pg/mL — ABNORMAL HIGH (ref 14.0–72.0)

## 2012-10-08 MED ORDER — VITAMIN D (ERGOCALCIFEROL) 1.25 MG (50000 UNIT) PO CAPS
50000.0000 [IU] | ORAL_CAPSULE | ORAL | Status: DC
  Filled 2012-10-08: qty 1

## 2012-10-08 MED ORDER — POLYSACCHARIDE IRON COMPLEX 150 MG PO CAPS
150.0000 mg | ORAL_CAPSULE | Freq: Two times a day (BID) | ORAL | Status: DC
  Administered 2012-10-08 – 2012-10-10 (×5): 150 mg via ORAL
  Filled 2012-10-08 (×5): qty 1

## 2012-10-08 NOTE — Progress Notes (Signed)
Subjective: Interval History: has no complaint of difficulty in breathing. Her weakness is improving. She denies any nausea no vomiting..  Objective: Vital signs in last 24 hours: Temp:  [97.6 F (36.4 C)-98.1 F (36.7 C)] 97.6 F (36.4 C) (05/06 0447) Pulse Rate:  [54-62] 54 (05/06 0447) Resp:  [20] 20 (05/06 0447) BP: (116-174)/(56-69) 174/69 mmHg (05/06 0447) SpO2:  [96 %-100 %] 96 % (05/06 0447) Weight:  [106.9 kg (235 lb 10.8 oz)] 106.9 kg (235 lb 10.8 oz) (05/06 0447) Weight change: -1.056 kg (-2 lb 5.3 oz)  Intake/Output from previous day: 05/05 0701 - 05/06 0700 In: 4938 [P.O.:1560; I.V.:3378] Out: 5504 [Urine:5500; Stool:4] Intake/Output this shift:    General appearance: alert, cooperative and no distress Resp: clear to auscultation bilaterally Cardio: regular rate and rhythm, S1, S2 normal, no murmur, click, rub or gallop GI: soft, non-tender; bowel sounds normal; no masses,  no organomegaly Extremities: extremities normal, atraumatic, no cyanosis or edema  Lab Results:  Recent Labs  10/06/12 1202 10/07/12 0514  WBC 8.3 8.3  HGB 9.5* 9.4*  HCT 29.3* 28.4*  PLT 138* 142*   BMET:  Recent Labs  10/07/12 0514 10/08/12 0533  NA 141 139  K 4.2 4.1  CL 113* 109  CO2 17* 19  GLUCOSE 82 104*  BUN 95* 92*  CREATININE 2.88* 2.95*  CALCIUM 8.3* 7.9*   No results found for this basename: PTH,  in the last 72 hours Iron Studies:  Recent Labs  10/07/12 0513  IRON 38*  TIBC 210*  FERRITIN 76    Studies/Results: Dg Chest Portable 1 View  10/06/2012  *RADIOLOGY REPORT*  Clinical Data: Chest pain  PORTABLE CHEST - 1 VIEW  Comparison: 06/24/2012  Findings: Borderline cardiomegaly.  No acute infiltrate or pulmonary edema.  Bony thorax is stable.  IMPRESSION: Borderline cardiomegaly.  No active disease.   Original Report Authenticated By: Natasha Mead, M.D.     I have reviewed the patient's current medications.  Assessment/Plan: Problem #1 renal failure  chronic again is 92 creatinine is 2.95 slight increase. This could be secondary to diuretic use. Problem #2 hypertension her blood pressure seems to be better controlled Problem #3 history of hyperkalemia potassium is controlled Problem #4 history of diabetes Problem #5 anemia her hemoglobin and hematocrit is stable. She is low iron saturation and ferritin hence she has iron deficiency anemia. Problem #6 metabolic bone disease her calcium and phosphorus is stable. Problem #7 vitamin D deficiency her vitamin D level is 17. Problem #8 of proteinuria in nephrotic range.  Plan: We'll start patient on Nu-Iron 150 mg by mouth twice a day Will start patient on Ergoclciferol 50,000 units by mouth once a week. We'll DC Lasix We'll check her basic metabolic panel in the morning.   LOS: 2 days   Akhila Mahnken S 10/08/2012,7:33 AM

## 2012-10-08 NOTE — Progress Notes (Signed)
[facility] PROGRESS NOTE  [name] ZOX:[id number] DOB: 07-29-1940 DOA: 10/06/2012 PCP: Josue Hector, MD Endocrinologist: Margaretmary Bayley, M.D.  Nephrologist: Dr. Kristian Covey  Cardiologist: Angelina Sheriff, M.D.  Brief narrative: 72 year old woman presented to the emergency department with complaint of shortness of breath and back pain. Initial evaluation from a cardiopulmonary perspective was unremarkable however blood work revealed hyperkalemia, metabolic acidosis and acute renal failure. Patient was admitted and started on IV fluids and Lasix in consultation with nephrology. Shortness of breath has resolved, there has been modest improvement in kidney function. Workup is underway an attempt to ascertain the etiology of declining renal function. Disposition home is pending improvement in renal function.  Assessment/Plan: 1. Acute on chronic renal failure: No significant change since yesterday. Appreciate nephrology management. Continue fluids, Lasix discontinued 2. Hyperkalemia: Resolved status post Kayexalate. Hopefully resume lisinopril soon. 3. Anion gap metabolic acidosis: Continues to improve with although no change in renal function today. 4. Anemia: Stable. Presumably secondary to chronic disease from kidney disease. 5. Thrombocytopenia: Minimal. Stable. Possibly chronic. 6. Diabetes mellitus type 2: No recurrent hypoglycemia last 24 hours. Uncontrolled with hemoglobin A1c 7.54/16/2003. Oral medication on hold. Monitor CBG. Hold sliding scale insulin. 7. History of coronary artery disease: Stable. Continue Coreg, Imdur, nitroglycerin as needed. 8. Cellulitis right lower extremity: Present on admission. Resolving. Continue doxycycline. 9. Morbid obesity 10. History diastolic dysfunction: Appears well compensated.   Code Status: Full code DVT prophylaxis: Heparin Family Communication: discussed with daughter [name] by telephone, all questions answered. We discussed the  severity of her kidney disease and uncertain prognosis. Disposition Plan: Home when improved.  Brendia Sacks, MD  [facility]  Pager [phone] If 7PM-7AM, please contact night-coverage at [email or web address], password [facility] 10/08/2012, 12:09 PM  LOS: 2 days   Consultants:  Nephrology  Procedures:    HPI/Subjective: Feels okay. No complaints. Breathing better.  Objective: Filed Vitals:   10/07/12 0353 10/07/12 1400 10/07/12 2040 10/08/12 0447  BP: 137/66 137/56 116/60 174/69  Pulse: 30 60 62 54  Temp: 97.6 F (36.4 C) 97.8 F (36.6 C) 98.1 F (36.7 C) 97.6 F (36.4 C)  TempSrc: Oral Oral Oral Oral  Resp: 20 20 20 20   Height:      Weight: 110.2 kg (242 lb 15.2 oz)   106.9 kg (235 lb 10.8 oz)  SpO2: 95% 100% 96% 96%    Intake/Output Summary (Last 24 hours) at 10/08/12 1209 Last data filed at 10/08/12 0745  Gross per 24 hour  Intake   4455 ml  Output   4352 ml  Net    103 ml   Filed Weights   10/06/12 1537 10/07/12 0353 10/08/12 0447  Weight: 109.3 kg (240 lb 15.4 oz) 110.2 kg (242 lb 15.2 oz) 106.9 kg (235 lb 10.8 oz)    Exam:  General:  Appears calm and comfortable Cardiovascular: RRR, no m/r/g. No LE edema. Telemetry: SB  Respiratory: CTA bilaterally, no w/r/r. Normal respiratory effort. Psychiatric: grossly normal mood and affect, speech fluent and appropriate   Data Reviewed: Basic Metabolic Panel:  Recent Labs Lab 10/06/12 1202 10/06/12 2057 10/07/12 0513 10/07/12 0514 10/08/12 0533  NA 138 142  --  141 139  K 6.2* 4.9  --  4.2 4.1  CL 115* 112  --  113* 109  CO2 12* 14*  --  17* 19  GLUCOSE 322* 127*  --  82 104*  BUN 110* 102*  --  95* 92*  CREATININE 3.11* 3.15*  --  2.88* 2.95*  CALCIUM 8.6 8.9  --  8.3* 7.9*  MG  --  1.9  --   --   --   PHOS  --   --  5.2*  --  5.1*   CBC:  Recent Labs Lab 10/06/12 1202 10/07/12 0514  WBC 8.3 8.3  NEUTROABS 4.4  --   HGB 9.5* 9.4*  HCT 29.3* 28.4*  MCV 88.8 87.1  PLT 138* 142*   Cardiac  Enzymes:  Recent Labs Lab 10/06/12 1202  TROPONINI <0.30     Recent Labs  06/24/12 1740 10/06/12 2057  PROBNP 4638.0* 6749.0*   CBG:  Recent Labs Lab 10/07/12 1109 10/07/12 1636 10/07/12 2114 10/08/12 0716 10/08/12 1136  GLUCAP 174* 148* 185* 97 150*      Studies: Dg Chest Portable 1 View  10/06/2012  *RADIOLOGY REPORT*  Clinical Data: Chest pain  PORTABLE CHEST - 1 VIEW  Comparison: 06/24/2012  Findings: Borderline cardiomegaly.  No acute infiltrate or pulmonary edema.  Bony thorax is stable.  IMPRESSION: Borderline cardiomegaly.  No active disease.   Original Report Authenticated By: Natasha Mead, M.D.     Scheduled Meds: . carvedilol  6.25 mg Oral BID WC  . docusate sodium  100 mg Oral BID  . doxycycline  100 mg Oral BID  . furosemide  60 mg Intravenous Q12H  . iron polysaccharides  150 mg Oral BID  . isosorbide mononitrate  30 mg Oral Daily  . pantoprazole  40 mg Oral Daily  . pregabalin  50 mg Oral BID  . sodium chloride  3 mL Intravenous Q12H  . sodium polystyrene  30 g Oral Once  . Vitamin D (Ergocalciferol)  50,000 Units Oral Q7 days   Continuous Infusions: . sodium chloride 135 mL/hr at 10/08/12 0610    Principal Problem:   ARF (acute renal failure) Active Problems:   Anemia   CAD (coronary artery disease)   CKD (chronic kidney disease), stage IV   Morbid obesity   Hyperkalemia   High anion gap metabolic acidosis   DM type 2, uncontrolled, with neuropathy   Brendia Sacks, MD  [facility] Pager [phone] If 7PM-7AM, please contact night-coverage at [email or web address], password [facility] 10/08/2012, 12:09 PM  LOS: 2 days   Time spent: 20 minutes

## 2012-10-09 DIAGNOSIS — I2510 Atherosclerotic heart disease of native coronary artery without angina pectoris: Secondary | ICD-10-CM

## 2012-10-09 DIAGNOSIS — E119 Type 2 diabetes mellitus without complications: Secondary | ICD-10-CM

## 2012-10-09 LAB — GLUCOSE, CAPILLARY
Glucose-Capillary: 183 mg/dL — ABNORMAL HIGH (ref 70–99)
Glucose-Capillary: 180 mg/dL — ABNORMAL HIGH (ref 70–99)
Glucose-Capillary: 166 mg/dL — ABNORMAL HIGH (ref 70–99)

## 2012-10-09 LAB — BASIC METABOLIC PANEL
BUN: 85 mg/dL — ABNORMAL HIGH (ref 6–23)
Calcium: 8.3 mg/dL — ABNORMAL LOW (ref 8.4–10.5)
GFR calc non Af Amer: 17 mL/min — ABNORMAL LOW (ref >90)
Glucose, Bld: 130 mg/dL — ABNORMAL HIGH (ref 70–99)

## 2012-10-09 LAB — COMPLEMENT, TOTAL: Compl, Total (CH50): >60 U/mL — ABNORMAL HIGH (ref 31–60)

## 2012-10-09 NOTE — Progress Notes (Signed)
Notified Dr. Kerry Hough that pt had a 5 beat run of VTach and that she has fluctuated between First Degree AVB and AFib at times today. Pt does have a hx of arrhythmia. No new orders at this time. Sheryn Bison

## 2012-10-09 NOTE — Progress Notes (Addendum)
[name]  MRN: [id number]  DOB/AGE: 1941-05-22 72 y.o.  Primary Care Physician:NYLAND,LEONARD Molly Maduro, MD  Admit date: 10/06/2012  Chief Complaint:  Chief Complaint  Patient presents with  . Chest Pain    S-Pt presented on  10/06/2012 with  Chief Complaint  Patient presents with  . Chest Pain  .    Pt today feels better  Meds  . carvedilol  6.25 mg Oral BID WC  . docusate sodium  100 mg Oral BID  . doxycycline  100 mg Oral BID  . furosemide  60 mg Intravenous Q12H  . iron polysaccharides  150 mg Oral BID  . isosorbide mononitrate  30 mg Oral Daily  . pantoprazole  40 mg Oral Daily  . pregabalin  50 mg Oral BID  . sodium chloride  3 mL Intravenous Q12H  . sodium polystyrene  30 g Oral Once  . Vitamin D (Ergocalciferol)  50,000 Units Oral Q7 days         Physical Exam: Vital signs in last 24 hours: Temp:  [97.3 F (36.3 C)-98.1 F (36.7 C)] 98.1 F (36.7 C) (05/07 0622) Pulse Rate:  [51-64] 51 (05/07 0622) Resp:  [20] 20 (05/07 0622) BP: (144-178)/(63-90) 178/90 mmHg (05/07 0801) SpO2:  [97 %-98 %] 97 % (05/07 0622) Weight change:  Last BM Date: 10/08/12  Intake/Output from previous day: 05/06 0701 - 05/07 0700 In: 3747.8 [P.O.:1320; I.V.:2427.8] Out: 5250 [Urine:5250] Total I/O In: 1093.5 [I.V.:1093.5] Out: 1700 [Urine:1700]   Physical Exam: General- pt is awake,alert, oriented to time place and person Resp- No acute REsp distress, CTA B/L NO Rhonchi CVS- S1S2 regular in rate and rhythm GIT- BS+, soft, NT, ND EXT- NO LE Edema, Cyanosis   Lab Results: CBC  Recent Labs  10/06/12 1202 10/07/12 0514  WBC 8.3 8.3  HGB 9.5* 9.4*  HCT 29.3* 28.4*  PLT 138* 142*    BMET  Recent Labs  10/08/12 0533 10/09/12 0606  NA 139 142  K 4.1 4.4  CL 109 109  CO2 19 21  GLUCOSE 104* 130*  BUN 92* 85*  CREATININE 2.95* 2.62*  CALCIUM 7.9* 8.3*    Creat Trend 2014 3.15==>2.95==>2.62 2013 1.3--1.4 2012  1.3--1.6    Lab Results   Component Value Date   PTH 166.2* 10/07/2012   CALCIUM 8.3* 10/09/2012   CAION 1.17 04/26/2011   PHOS 5.1* 10/08/2012               Impression: 1)Renal  AKI secondary to Prerenal/ ATN               AKI on CKD               AKI now improving               Creat trending down               CKD stage 3 .               CKD since 2012               CKD secondary to HTN/ DM                Progression of CKD slow but now has AKI                Proteinura  Present                     NON nephrotic 2.8  grmas on 10/07/12 eval.  2)HTN   Bp on higher side Target Organ damage  CKD CAD  Medication- On Diuretics-Lasix On Alpha and beta Blockers   3)Anemia HGb at goal (9--11)   4)CKD Mineral-Bone Disorder PTH elevated. Secondary Hyperparathyroidism  present . Phosphorus -highside of the goal. Vitamin 25-OH low( 17) on replacement  5)DM- beong managed by Primary Team  6)FEN  Normokalemic- was hyperkalemic NOrmonatremic   7)Acid base Co2  Now at goal Was low earlier    Plan:  Will continue current therapy. BP high from IVF- will start decreasing in am If Bp still on higher side -will start Amlodpine Will follow bmet       Kirsi Hugh S 10/09/2012, 9:43 AM

## 2012-10-09 NOTE — Progress Notes (Signed)
[facility] PROGRESS NOTE  [name] UUV:[id number] DOB: Nov 06, 1940 DOA: 10/06/2012 PCP: Josue Hector, MD Endocrinologist: Margaretmary Bayley, M.D.  Nephrologist: Dr. Kristian Covey  Cardiologist: Angelina Sheriff, M.D.  Brief narrative: 72 year old woman presented to the emergency department with complaint of shortness of breath and back pain. Initial evaluation from a cardiopulmonary perspective was unremarkable however blood work revealed hyperkalemia, metabolic acidosis and acute renal failure. Patient was admitted and started on IV fluids and Lasix in consultation with nephrology. Shortness of breath has resolved, there has been modest improvement in kidney function. Workup is underway an attempt to ascertain the etiology of declining renal function. Disposition home is pending improvement in renal function.  Assessment/Plan: 1. Acute on chronic renal failure: Creatinine has mildly improved today. Appreciate nephrology assistance. Continue current treatments. 2. Hyperkalemia: Resolved status post Kayexalate. Hopefully resume lisinopril soon. 3. Anion gap metabolic acidosis: Continues to improve with although no change in renal function today. 4. Anemia: Stable. Presumably secondary to chronic disease from kidney disease. 5. Thrombocytopenia: Minimal. Stable. Possibly chronic. 6. Diabetes mellitus type 2: Controlled with hemoglobin A1c 7.5 09/18/2001. Oral medication on hold. Monitor CBG. Hold sliding scale insulin. 7. History of coronary artery disease: Stable. Continue Coreg, Imdur, nitroglycerin as needed. 8. Cellulitis right lower extremity: Present on admission. Resolving. Continue doxycycline. 9. Morbid obesity 10. History diastolic dysfunction: Appears well compensated.   Code Status: Full code DVT prophylaxis: Heparin Family Communication: Discussed with patient, no family at the bedside Disposition Plan: Home when improved. Discussed with nephrology and patient will likely  need to be in the hospital another few days.  Erick Blinks, MD  [facility]  Pager [phone] If 7PM-7AM, please contact night-coverage at [email or web address], password [facility] 10/09/2012, 4:48 PM  LOS: 3 days   Consultants:  Nephrology  Procedures:    HPI/Subjective: No new complaints today.  Objective: Filed Vitals:   10/08/12 2205 10/09/12 0622 10/09/12 0801 10/09/12 1438  BP: 144/63 149/65 178/90 147/77  Pulse: 64 51  55  Temp: 97.7 F (36.5 C) 98.1 F (36.7 C)  98.2 F (36.8 C)  TempSrc:    Oral  Resp: 20 20  20   Height:      Weight:      SpO2: 97% 97%  97%    Intake/Output Summary (Last 24 hours) at 10/09/12 1648 Last data filed at 10/09/12 1324  Gross per 24 hour  Intake 3011.25 ml  Output   6150 ml  Net -3138.75 ml   Filed Weights   10/06/12 1537 10/07/12 0353 10/08/12 0447  Weight: 109.3 kg (240 lb 15.4 oz) 110.2 kg (242 lb 15.2 oz) 106.9 kg (235 lb 10.8 oz)    Exam:  General:  Appears calm and comfortable Cardiovascular: RRR, no m/r/g. No LE edema. Telemetry: SB  Respiratory: CTA bilaterally, no w/r/r. Normal respiratory effort. Psychiatric: grossly normal mood and affect, speech fluent and appropriate   Data Reviewed: Basic Metabolic Panel:  Recent Labs Lab 10/06/12 1202 10/06/12 2057 10/07/12 0513 10/07/12 0514 10/08/12 0533 10/09/12 0606  NA 138 142  --  141 139 142  K 6.2* 4.9  --  4.2 4.1 4.4  CL 115* 112  --  113* 109 109  CO2 12* 14*  --  17* 19 21  GLUCOSE 322* 127*  --  82 104* 130*  BUN 110* 102*  --  95* 92* 85*  CREATININE 3.11* 3.15*  --  2.88* 2.95* 2.62*  CALCIUM 8.6 8.9 8.2* 8.3* 7.9* 8.3*  MG  --  1.9  --   --   --   --  PHOS  --   --  5.2*  --  5.1*  --    CBC:  Recent Labs Lab 10/06/12 1202 10/07/12 0514  WBC 8.3 8.3  NEUTROABS 4.4  --   HGB 9.5* 9.4*  HCT 29.3* 28.4*  MCV 88.8 87.1  PLT 138* 142*   Cardiac Enzymes:  Recent Labs Lab 10/06/12 1202  TROPONINI <0.30     Recent Labs   06/24/12 1740 10/06/12 2057  PROBNP 4638.0* 6749.0*   CBG:  Recent Labs Lab 10/08/12 1136 10/08/12 1700 10/08/12 2236 10/09/12 0728 10/09/12 1156  GLUCAP 150* 207* 139* 126* 183*      Studies: No results found.  Scheduled Meds: . carvedilol  6.25 mg Oral BID WC  . docusate sodium  100 mg Oral BID  . doxycycline  100 mg Oral BID  . furosemide  60 mg Intravenous Q12H  . iron polysaccharides  150 mg Oral BID  . isosorbide mononitrate  30 mg Oral Daily  . pantoprazole  40 mg Oral Daily  . pregabalin  50 mg Oral BID  . sodium chloride  3 mL Intravenous Q12H  . sodium polystyrene  30 g Oral Once  . Vitamin D (Ergocalciferol)  50,000 Units Oral Q7 days   Continuous Infusions: . sodium chloride 135 mL/hr at 10/09/12 1610    Principal Problem:   ARF (acute renal failure) Active Problems:   Anemia   CAD (coronary artery disease)   CKD (chronic kidney disease), stage IV   Morbid obesity   Hyperkalemia   High anion gap metabolic acidosis   DM type 2, uncontrolled, with neuropathy   Erick Blinks, MD  [facility] Pager [phone] If 7PM-7AM, please contact night-coverage at [email or web address], password [facility] 10/09/2012, 4:48 PM  LOS: 3 days   Time spent: 25 minutes

## 2012-10-10 LAB — BASIC METABOLIC PANEL
BUN: 84 mg/dL — ABNORMAL HIGH (ref 6–23)
CO2: 20 mEq/L (ref 19–32)
Calcium: 8.1 mg/dL — ABNORMAL LOW (ref 8.4–10.5)
Chloride: 107 mEq/L (ref 96–112)
Creatinine, Ser: 2.53 mg/dL — ABNORMAL HIGH (ref 0.50–1.10)
GFR calc Af Amer: 21 mL/min — ABNORMAL LOW (ref >90)
GFR calc non Af Amer: 18 mL/min — ABNORMAL LOW (ref >90)
Glucose, Bld: 143 mg/dL — ABNORMAL HIGH (ref 70–99)
Potassium: 3.9 mEq/L (ref 3.5–5.1)
Sodium: 140 mEq/L (ref 135–145)

## 2012-10-10 LAB — GLUCOSE, CAPILLARY
Glucose-Capillary: 137 mg/dL — ABNORMAL HIGH (ref 70–99)
Glucose-Capillary: 223 mg/dL — ABNORMAL HIGH (ref 70–99)

## 2012-10-10 MED ORDER — FUROSEMIDE 40 MG PO TABS: 40.0000 mg | ORAL_TABLET | Freq: Every day | ORAL | Status: DC | PRN

## 2012-10-10 MED ORDER — GLIMEPIRIDE 4 MG PO TABS: 2.0000 mg | ORAL_TABLET | Freq: Every day | ORAL | Status: DC

## 2012-10-10 MED ORDER — POLYSACCHARIDE IRON COMPLEX 150 MG PO CAPS: 150.0000 mg | ORAL_CAPSULE | Freq: Two times a day (BID) | ORAL | Status: DC

## 2012-10-10 MED ORDER — LISINOPRIL 10 MG PO TABS
20.0000 mg | ORAL_TABLET | Freq: Every day | ORAL | Status: DC
  Administered 2012-10-10: 20 mg via ORAL
  Filled 2012-10-10: qty 2

## 2012-10-10 NOTE — Progress Notes (Signed)
UR chart review completed.  

## 2012-10-10 NOTE — Progress Notes (Signed)
Subjective: Interval History: has no complaint of nausea or vomiting. Patient denies also any difficulty breathing. Her weakness has improved..  Objective: Vital signs in last 24 hours: Temp:  [97.6 F (36.4 C)-98.2 F (36.8 C)] 97.6 F (36.4 C) (05/08 0420) Pulse Rate:  [55-63] 63 (05/08 0420) Resp:  [20] 20 (05/08 0420) BP: (147-167)/(41-77) 167/60 mmHg (05/08 0420) SpO2:  [94 %-97 %] 96 % (05/08 0420) Weight:  [106.1 kg (233 lb 14.5 oz)] 106.1 kg (233 lb 14.5 oz) (05/08 0420) Weight change:   Intake/Output from previous day: 05/07 0701 - 05/08 0700 In: 1813.5 [P.O.:720; I.V.:1093.5] Out: 7101 [Urine:7100; Stool:1] Intake/Output this shift:    General appearance: alert, cooperative and no distress Resp: clear to auscultation bilaterally Cardio: regular rate and rhythm, S1, S2 normal, no murmur, click, rub or gallop GI: soft, non-tender; bowel sounds normal; no masses,  no organomegaly Extremities: extremities normal, atraumatic, no cyanosis or edema  Lab Results: No results found for this basename: WBC, HGB, HCT, PLT,  in the last 72 hours BMET:  Recent Labs  10/09/12 0606 10/10/12 0547  NA 142 140  K 4.4 3.9  CL 109 107  CO2 21 20  GLUCOSE 130* 143*  BUN 85* 84*  CREATININE 2.62* 2.53*  CALCIUM 8.3* 8.1*   No results found for this basename: PTH,  in the last 72 hours Iron Studies: No results found for this basename: IRON, TIBC, TRANSFERRIN, FERRITIN,  in the last 72 hours  Studies/Results: No results found.  I have reviewed the patient's current medications.  Assessment/Plan: Problem #1 renal failure her BUN is 84 and creatinine is 2.53 renal function is progressively improving Problem #2 proteinuria in nephrotic range most likely secondary to diabetes. Problem #3 hypertension her blood pressure still seems to be high Problem #4 history of CHF with edema presently she is on Lasix was good urine output. Problem #5 diabetes Problem #6 carotid disease  asymptomatic Problem #7. Anemia compression of iron deficiency and anemia of chronic disease patient on iron supplement. Problem #8 metabolic bone disease. Presently patient with vitamin D she started with vitamin D supplement. Her PTH is also somewhat high and this is due to secondary hyperparathyroidism. Plan: We'll start patient on Lisinopril 20 mg by mouth once a day to control her blood pressure and also for her proteinuria. We'll DC IV Lasix. We'll follow patient once she is discharged as outpatient    LOS: 4 days   Lafreda Casebeer S 10/10/2012,8:11 AM

## 2012-10-10 NOTE — Discharge Summary (Signed)
Physician Discharge Summary  [name] ZOX:[id number] DOB: 1941/05/05 DOA: 10/06/2012  PCP: Josue Hector, MD  Admit date: 10/06/2012 Discharge date: 10/10/2012  Time spent: 40 minutes  Recommendations for Outpatient Follow-up:  1. Follow up with nephrology as an outpatient 2. Follow up with primary care doctor in 1-2 weeks  Discharge Diagnoses:  Principal Problem:   ARF (acute renal failure) Active Problems:   Anemia   CAD (coronary artery disease)   CKD (chronic kidney disease), stage IV   Morbid obesity   Hyperkalemia   High anion gap metabolic acidosis   DM type 2, uncontrolled, with neuropathy   Discharge Condition: improved  Diet recommendation: low salt, low carb  Filed Weights   10/07/12 0353 10/08/12 0447 10/10/12 0420  Weight: 110.2 kg (242 lb 15.2 oz) 106.9 kg (235 lb 10.8 oz) 106.1 kg (233 lb 14.5 oz)    History of present illness:  72 year old woman presents to the emergency department with complaint of shortness of breath and back pain.On initial evaluation from a cardiopulmonary perspective was unremarkable however blood work revealed hyperkalemia, metabolic acidosis and acute renal failure.  History obtained from chart review, patient and sons at bedside. I note that the ER record from nurse and EDP chest pain. However the patient denies chest pain to me and minimizes her back pain. Patient reports the last 2 weeks she has had increasing dyspnea on exertion without chest pain. Because of increasing dyspnea on exertion she came to the emergency department today. She has chronic stable angina which is unchanged for which she takes intermittent nitroglycerin. She has been told by her primary care physician and her endocrinologist that she has "failing kidneys". She was seen by Dr. Kristian Covey 08/2012 and was instructed to begin a 24-hour urine test this morning in anticipation of follow up with him in 2 weeks. She reports normal urination and oral intake. She brings  copies of lab work obtained by Dr. Chestine Spore 4/16 as follows: Potassium 5.5, BUN 71, creatinine 2.36, hemoglobin A1c 7.5. She reports taking Lasix twice a day. She reports a fall sometime ago with some resultant back pain, but does not complaining significantly of it during the interview.  In the emergency department she was noted be afebrile, borderline bradycardic and mildly hypertensive. No hypoxia. Potassium 6.2, bicarbonate 12, BUN 110, creatinine 3.11. Glucose 322. Chest x-ray was unremarkable and EKG without significant change from prior, specifically no changes secondary to hyperkalemia   Hospital Course:  This lady was admitted to the hospital with acute on chronic renal failure, hyperkalemia and metabolic acidosis felt to be secondary to uremia. She was started on IV fluids as well as Lasix and consultation with nephrology. Her shortness of breath that was present on admission resolved. Her renal function has somewhat improved but appears to have plateaued. Her current creatinine is 2.5. Her metabolic acidosis since resolved. Etiology of her renal failure was felt to be prerenal, although the patient was also on an ACE inhibitor prior to admission. Her fluids and Lasix were adjusted in the hospital. Her net volume status is -7.8 L. At this point, it is recommended that patient take Lasix on an as-needed basis depending on her shortness of breath, edema, and weight. She's also been restarted on ACE inhibitor by nephrology. Her renal function will be further followed up in the outpatient setting. The remainder of her chronic medical problems have remained stable. She due to renal failure we have decreased her dose and will need further adjustment as an  outpatient. She is asked to followup with her primary care physician 1-2 weeks.  Procedures:  none  Consultations: Nephrology  Discharge Exam: Filed Vitals:   10/09/12 1438 10/09/12 2141 10/10/12 0420 10/10/12 1436  BP: 147/77 151/41 167/60 154/67   Pulse: 55 58 63 57  Temp: 98.2 F (36.8 C) 97.6 F (36.4 C) 97.6 F (36.4 C) 98 F (36.7 C)  TempSrc: Oral Oral Oral Oral  Resp: 20 20 20 19   Height:      Weight:   106.1 kg (233 lb 14.5 oz)   SpO2: 97% 94% 96% 98%    General: No acute distress Cardiovascular: S1, S2, regular rate and rhythm Respiratory: Clear to auscultation bilaterally  Discharge Instructions  Discharge Orders   Future Orders Complete By Expires     (HEART FAILURE PATIENTS) Call MD:  Anytime you have any of the following symptoms: 1) 3 pound weight gain in 24 hours or 5 pounds in 1 week 2) shortness of breath, with or without a dry hacking cough 3) swelling in the hands, feet or stomach 4) if you have to sleep on extra pillows at night in order to breathe.  As directed     Call MD for:  extreme fatigue  As directed     Call MD for:  persistant dizziness or light-headedness  As directed     Call MD for:  persistant nausea and vomiting  As directed     Diet - low sodium heart healthy  As directed     Diet Carb Modified  As directed     Increase activity slowly  As directed         Medication List    TAKE these medications       carvedilol 6.25 MG tablet  Commonly known as:  COREG  Take 6.25 mg by mouth 2 (two) times daily with a meal.     cetirizine 10 MG tablet  Commonly known as:  ZYRTEC  Take 10 mg by mouth 2 (two) times daily.     Cyanocobalamin 500 MCG Subl  Place 1 tablet under the tongue 2 (two) times daily.     docusate sodium 100 MG capsule  Commonly known as:  COLACE  Take 100 mg by mouth 2 (two) times daily.     doxycycline 100 MG capsule  Commonly known as:  MONODOX  Take 100 mg by mouth 2 (two) times daily.     Ferrous Sulfate 27 MG Tabs  Take 1 tablet by mouth 2 (two) times daily.     fluocinonide cream 0.05 %  Commonly known as:  LIDEX  Apply 1 application topically 2 (two) times daily.     furosemide 40 MG tablet  Commonly known as:  LASIX  Take 1 tablet (40 mg total)  by mouth daily as needed (swelling).     glimepiride 4 MG tablet  Commonly known as:  AMARYL  Take 0.5 tablets (2 mg total) by mouth daily before breakfast.     guaiFENesin-dextromethorphan 100-10 MG/5ML syrup  Commonly known as:  ROBITUSSIN DM  Take 5 mLs by mouth every 4 (four) hours as needed for cough.     iron polysaccharides 150 MG capsule  Commonly known as:  NIFEREX  Take 1 capsule (150 mg total) by mouth 2 (two) times daily.     isosorbide mononitrate 30 MG 24 hr tablet  Commonly known as:  IMDUR  Take 30 mg by mouth daily.     lisinopril 20 MG tablet  Commonly known as:  PRINIVIL,ZESTRIL  Take 20 mg by mouth daily.     nitroGLYCERIN 0.4 MG SL tablet  Commonly known as:  NITROSTAT  Place 1 mg under the tongue every 5 (five) minutes x 3 doses as needed for chest pain. For chest pain     pantoprazole 40 MG tablet  Commonly known as:  PROTONIX  Take 40 mg by mouth daily.     pregabalin 50 MG capsule  Commonly known as:  LYRICA  Take 50 mg by mouth 2 (two) times daily.     rosuvastatin 20 MG tablet  Commonly known as:  CRESTOR  Take 20 mg by mouth at bedtime.       Allergies  Allergen Reactions  . Codeine Other (See Comments)    Childhood allergy       Follow-up Information   Follow up with [facility] S, MD On 11/02/2012. (@ 10:30)    Contact information:   [address] [place] [address] [phone]       Follow up with Josue Hector, MD. Schedule an appointment as soon as possible for a visit in 2 weeks.   Contact information:   [address] [facility] [address] [phone]        The results of significant diagnostics from this hospitalization (including imaging, microbiology, ancillary and laboratory) are listed below for reference.    Significant Diagnostic Studies: Dg Chest Portable 1 View  10/06/2012  *RADIOLOGY REPORT*  Clinical Data: Chest pain  PORTABLE CHEST - 1 VIEW  Comparison: 06/24/2012  Findings:  Borderline cardiomegaly.  No acute infiltrate or pulmonary edema.  Bony thorax is stable.  IMPRESSION: Borderline cardiomegaly.  No active disease.   Original Report Authenticated By: Natasha Mead, M.D.     Microbiology: No results found for this or any previous visit (from the past 240 hour(s)).   Labs: Basic Metabolic Panel:  Recent Labs Lab 10/06/12 2057 10/07/12 0513 10/07/12 0514 10/08/12 0533 10/09/12 0606 10/10/12 0547  NA 142  --  141 139 142 140  K 4.9  --  4.2 4.1 4.4 3.9  CL 112  --  113* 109 109 107  CO2 14*  --  17* 19 21 20   GLUCOSE 127*  --  82 104* 130* 143*  BUN 102*  --  95* 92* 85* 84*  CREATININE 3.15*  --  2.88* 2.95* 2.62* 2.53*  CALCIUM 8.9 8.2* 8.3* 7.9* 8.3* 8.1*  MG 1.9  --   --   --   --   --   PHOS  --  5.2*  --  5.1*  --   --    Liver Function Tests: No results found for this basename: AST, ALT, ALKPHOS, BILITOT, PROT, ALBUMIN,  in the last 168 hours No results found for this basename: LIPASE, AMYLASE,  in the last 168 hours No results found for this basename: AMMONIA,  in the last 168 hours CBC:  Recent Labs Lab 10/06/12 1202 10/07/12 0514  WBC 8.3 8.3  NEUTROABS 4.4  --   HGB 9.5* 9.4*  HCT 29.3* 28.4*  MCV 88.8 87.1  PLT 138* 142*   Cardiac Enzymes:  Recent Labs Lab 10/06/12 1202  TROPONINI <0.30   BNP: BNP (last 3 results)  Recent Labs  06/24/12 1740 10/06/12 2057  PROBNP 4638.0* 6749.0*   CBG:  Recent Labs Lab 10/09/12 1156 10/09/12 1650 10/09/12 2139 10/10/12 0743 10/10/12 1059  GLUCAP 183* 180* 166* 137* 223*  Signed:  MEMON,JEHANZEB  [facility] 10/10/2012, 2:45 PM

## 2012-10-12 LAB — VITAMIN D 1,25 DIHYDROXY: Vitamin D2 1, 25 (OH)2: <8 pg/mL

## 2012-11-01 ENCOUNTER — Encounter (HOSPITAL_COMMUNITY): Payer: Self-pay | Admitting: *Deleted

## 2012-11-01 ENCOUNTER — Emergency Department (HOSPITAL_COMMUNITY): Payer: Medicare Other

## 2012-11-01 ENCOUNTER — Inpatient Hospital Stay (HOSPITAL_COMMUNITY)
Admission: EM | Admit: 2012-11-01 | Discharge: 2012-11-05 | DRG: 683 | Disposition: A | Discharge status: Home / Self Care | Payer: Medicare Other | Attending: Internal Medicine | Admitting: Internal Medicine

## 2012-11-01 DIAGNOSIS — J189 Pneumonia, unspecified organism: Secondary | ICD-10-CM

## 2012-11-01 DIAGNOSIS — E872 Acidosis, unspecified: Secondary | ICD-10-CM | POA: Diagnosis present

## 2012-11-01 DIAGNOSIS — R05 Cough: Secondary | ICD-10-CM

## 2012-11-01 DIAGNOSIS — R5381 Other malaise: Secondary | ICD-10-CM | POA: Diagnosis present

## 2012-11-01 DIAGNOSIS — I129 Hypertensive chronic kidney disease with stage 1 through stage 4 chronic kidney disease, or unspecified chronic kidney disease: Secondary | ICD-10-CM | POA: Diagnosis present

## 2012-11-01 DIAGNOSIS — Z66 Do not resuscitate: Secondary | ICD-10-CM | POA: Diagnosis present

## 2012-11-01 DIAGNOSIS — N189 Chronic kidney disease, unspecified: Secondary | ICD-10-CM

## 2012-11-01 DIAGNOSIS — R748 Abnormal levels of other serum enzymes: Secondary | ICD-10-CM | POA: Diagnosis present

## 2012-11-01 DIAGNOSIS — R259 Unspecified abnormal involuntary movements: Secondary | ICD-10-CM | POA: Diagnosis present

## 2012-11-01 DIAGNOSIS — G2581 Restless legs syndrome: Secondary | ICD-10-CM | POA: Diagnosis present

## 2012-11-01 DIAGNOSIS — Z6841 Body Mass Index (BMI) 40.0 and over, adult: Secondary | ICD-10-CM

## 2012-11-01 DIAGNOSIS — E8729 Other acidosis: Secondary | ICD-10-CM | POA: Diagnosis present

## 2012-11-01 DIAGNOSIS — N289 Disorder of kidney and ureter, unspecified: Secondary | ICD-10-CM

## 2012-11-01 DIAGNOSIS — IMO0002 Reserved for concepts with insufficient information to code with codable children: Secondary | ICD-10-CM | POA: Diagnosis present

## 2012-11-01 DIAGNOSIS — I498 Other specified cardiac arrhythmias: Secondary | ICD-10-CM | POA: Diagnosis present

## 2012-11-01 DIAGNOSIS — Z8701 Personal history of pneumonia (recurrent): Secondary | ICD-10-CM

## 2012-11-01 DIAGNOSIS — N179 Acute kidney failure, unspecified: Principal | ICD-10-CM | POA: Diagnosis present

## 2012-11-01 DIAGNOSIS — I252 Old myocardial infarction: Secondary | ICD-10-CM

## 2012-11-01 DIAGNOSIS — IMO0001 Reserved for inherently not codable concepts without codable children: Secondary | ICD-10-CM | POA: Diagnosis present

## 2012-11-01 DIAGNOSIS — Z79899 Other long term (current) drug therapy: Secondary | ICD-10-CM

## 2012-11-01 DIAGNOSIS — R001 Bradycardia, unspecified: Secondary | ICD-10-CM | POA: Diagnosis present

## 2012-11-01 DIAGNOSIS — E1165 Type 2 diabetes mellitus with hyperglycemia: Secondary | ICD-10-CM | POA: Diagnosis present

## 2012-11-01 DIAGNOSIS — E119 Type 2 diabetes mellitus without complications: Secondary | ICD-10-CM

## 2012-11-01 DIAGNOSIS — F411 Generalized anxiety disorder: Secondary | ICD-10-CM | POA: Diagnosis present

## 2012-11-01 DIAGNOSIS — E875 Hyperkalemia: Secondary | ICD-10-CM | POA: Diagnosis present

## 2012-11-01 DIAGNOSIS — I2510 Atherosclerotic heart disease of native coronary artery without angina pectoris: Secondary | ICD-10-CM | POA: Diagnosis present

## 2012-11-01 DIAGNOSIS — D649 Anemia, unspecified: Secondary | ICD-10-CM | POA: Diagnosis present

## 2012-11-01 DIAGNOSIS — R7402 Elevation of levels of lactic acid dehydrogenase (LDH): Secondary | ICD-10-CM | POA: Diagnosis present

## 2012-11-01 DIAGNOSIS — D638 Anemia in other chronic diseases classified elsewhere: Secondary | ICD-10-CM | POA: Diagnosis present

## 2012-11-01 DIAGNOSIS — Z8249 Family history of ischemic heart disease and other diseases of the circulatory system: Secondary | ICD-10-CM

## 2012-11-01 DIAGNOSIS — E1142 Type 2 diabetes mellitus with diabetic polyneuropathy: Secondary | ICD-10-CM | POA: Diagnosis present

## 2012-11-01 DIAGNOSIS — N184 Chronic kidney disease, stage 4 (severe): Secondary | ICD-10-CM | POA: Diagnosis present

## 2012-11-01 DIAGNOSIS — R7401 Elevation of levels of liver transaminase levels: Secondary | ICD-10-CM | POA: Diagnosis present

## 2012-11-01 DIAGNOSIS — N2581 Secondary hyperparathyroidism of renal origin: Secondary | ICD-10-CM | POA: Diagnosis present

## 2012-11-01 DIAGNOSIS — E1140 Type 2 diabetes mellitus with diabetic neuropathy, unspecified: Secondary | ICD-10-CM | POA: Diagnosis present

## 2012-11-01 DIAGNOSIS — I119 Hypertensive heart disease without heart failure: Secondary | ICD-10-CM

## 2012-11-01 DIAGNOSIS — E1149 Type 2 diabetes mellitus with other diabetic neurological complication: Secondary | ICD-10-CM | POA: Diagnosis present

## 2012-11-01 DIAGNOSIS — I499 Cardiac arrhythmia, unspecified: Secondary | ICD-10-CM

## 2012-11-01 DIAGNOSIS — I214 Non-ST elevation (NSTEMI) myocardial infarction: Secondary | ICD-10-CM

## 2012-11-01 DIAGNOSIS — R079 Chest pain, unspecified: Secondary | ICD-10-CM | POA: Diagnosis not present

## 2012-11-01 DIAGNOSIS — E785 Hyperlipidemia, unspecified: Secondary | ICD-10-CM | POA: Diagnosis present

## 2012-11-01 LAB — HEPATIC FUNCTION PANEL
AST: 33 U/L (ref 0–37)
Bilirubin, Direct: <0.1 mg/dL (ref 0.0–0.3)
Total Protein: 6.5 g/dL (ref 6.0–8.3)

## 2012-11-01 LAB — BASIC METABOLIC PANEL
BUN: 103 mg/dL — ABNORMAL HIGH (ref 6–23)
Chloride: 111 mEq/L (ref 96–112)
GFR calc Af Amer: 14 mL/min — ABNORMAL LOW (ref >90)
GFR calc non Af Amer: 12 mL/min — ABNORMAL LOW (ref >90)
Potassium: 5.9 mEq/L — ABNORMAL HIGH (ref 3.5–5.1)

## 2012-11-01 LAB — CBC WITH DIFFERENTIAL/PLATELET
Basophils Absolute: 0 10*3/uL (ref 0.0–0.1)
Basophils Relative: 0 % (ref 0–1)
Eosinophils Absolute: 0.3 10*3/uL (ref 0.0–0.7)
Hemoglobin: 8.6 g/dL — ABNORMAL LOW (ref 12.0–15.0)
MCH: 28.8 pg (ref 26.0–34.0)
MCHC: 32.2 g/dL (ref 30.0–36.0)
Monocytes Relative: 7 % (ref 3–12)
Neutro Abs: 4 10*3/uL (ref 1.7–7.7)
Neutrophils Relative %: 52 % (ref 43–77)
Platelets: 133 10*3/uL — ABNORMAL LOW (ref 150–400)
RDW: 16.7 % — ABNORMAL HIGH (ref 11.5–15.5)

## 2012-11-01 LAB — URINALYSIS, ROUTINE W REFLEX MICROSCOPIC
Glucose, UA: NEGATIVE mg/dL
Leukocytes, UA: NEGATIVE
Protein, ur: 100 mg/dL — AB
Specific Gravity, Urine: 1.015 (ref 1.005–1.030)
Urobilinogen, UA: 0.2 mg/dL (ref 0.0–1.0)

## 2012-11-01 LAB — LIPID PANEL
LDL Cholesterol: 42 mg/dL (ref 0–99)
VLDL: 7 mg/dL (ref 0–40)

## 2012-11-01 LAB — TROPONIN I
Troponin I: <0.3 ng/mL (ref <0.30)
Troponin I: <0.3 ng/mL (ref <0.30)

## 2012-11-01 LAB — BLOOD GAS, ARTERIAL
Acid-base deficit: 16.1 mmol/L — ABNORMAL HIGH (ref 0.0–2.0)
Drawn by: 23534
FIO2: 0.21 %
O2 Content: 21 L/min
O2 Saturation: 97.3 %

## 2012-11-01 LAB — OCCULT BLOOD, POC DEVICE: Fecal Occult Bld: NEGATIVE

## 2012-11-01 MED ORDER — FERROUS SULFATE 325 (65 FE) MG PO TABS
325.0000 mg | ORAL_TABLET | Freq: Every day | ORAL | Status: DC
  Administered 2012-11-01 – 2012-11-02 (×2): 325 mg via ORAL
  Filled 2012-11-01 (×2): qty 1

## 2012-11-01 MED ORDER — ONDANSETRON HCL 4 MG/2ML IJ SOLN
4.0000 mg | Freq: Four times a day (QID) | INTRAMUSCULAR | Status: DC | PRN
  Administered 2012-11-03 – 2012-11-04 (×2): 4 mg via INTRAVENOUS
  Filled 2012-11-01 (×2): qty 2

## 2012-11-01 MED ORDER — NITROGLYCERIN 0.4 MG SL SUBL: 0.4000 mg | SUBLINGUAL_TABLET | SUBLINGUAL | Status: DC | PRN

## 2012-11-01 MED ORDER — PREGABALIN 50 MG PO CAPS: 50.0000 mg | ORAL_CAPSULE | Freq: Two times a day (BID) | ORAL | Status: DC

## 2012-11-01 MED ORDER — FUROSEMIDE 10 MG/ML IJ SOLN
80.0000 mg | Freq: Two times a day (BID) | INTRAMUSCULAR | Status: DC
  Administered 2012-11-01 – 2012-11-02 (×2): 80 mg via INTRAVENOUS
  Filled 2012-11-01 (×2): qty 8

## 2012-11-01 MED ORDER — SODIUM BICARBONATE 8.4 % IV SOLN
INTRAVENOUS | Status: AC
  Filled 2012-11-01: qty 50

## 2012-11-01 MED ORDER — NITROGLYCERIN 0.6 MG SL SUBL: 1.0000 mg | SUBLINGUAL_TABLET | SUBLINGUAL | Status: DC | PRN

## 2012-11-01 MED ORDER — SODIUM CHLORIDE 0.9 % IV SOLN: INTRAVENOUS | Status: DC

## 2012-11-01 MED ORDER — ATORVASTATIN CALCIUM 10 MG PO TABS: 10.0000 mg | ORAL_TABLET | Freq: Every day | ORAL | Status: DC

## 2012-11-01 MED ORDER — MORPHINE SULFATE 2 MG/ML IJ SOLN
1.0000 mg | INTRAMUSCULAR | Status: DC | PRN
  Administered 2012-11-01 – 2012-11-02 (×2): 1 mg via INTRAVENOUS
  Filled 2012-11-01 (×2): qty 1

## 2012-11-01 MED ORDER — SODIUM CHLORIDE 0.45 % IV SOLN
INTRAVENOUS | Status: DC
  Administered 2012-11-01 – 2012-11-02 (×2): via INTRAVENOUS
  Filled 2012-11-01 (×9): qty 1000

## 2012-11-01 MED ORDER — ASPIRIN EC 325 MG PO TBEC
325.0000 mg | DELAYED_RELEASE_TABLET | Freq: Every day | ORAL | Status: DC
  Administered 2012-11-02 – 2012-11-05 (×4): 325 mg via ORAL
  Filled 2012-11-01 (×4): qty 1

## 2012-11-01 MED ORDER — SODIUM CHLORIDE 0.45 % IV SOLN
INTRAVENOUS | Status: DC
  Administered 2012-11-01: 18:00:00 via INTRAVENOUS
  Filled 2012-11-01 (×10): qty 1000

## 2012-11-01 MED ORDER — ACETAMINOPHEN 500 MG PO TABS
1000.0000 mg | ORAL_TABLET | Freq: Once | ORAL | Status: AC
  Administered 2012-11-01: 1000 mg via ORAL
  Filled 2012-11-01: qty 2

## 2012-11-01 MED ORDER — HEPARIN SODIUM (PORCINE) 5000 UNIT/ML IJ SOLN
5000.0000 [IU] | Freq: Three times a day (TID) | INTRAMUSCULAR | Status: DC
  Administered 2012-11-01 – 2012-11-05 (×12): 5000 [IU] via SUBCUTANEOUS
  Filled 2012-11-01 (×12): qty 1

## 2012-11-01 MED ORDER — FLUOCINONIDE 0.05 % EX CREA
1.0000 | TOPICAL_CREAM | Freq: Two times a day (BID) | CUTANEOUS | Status: DC
Start: 2012-11-01 — End: 2012-11-01

## 2012-11-01 MED ORDER — SODIUM POLYSTYRENE SULFONATE 15 GM/60ML PO SUSP
30.0000 g | Freq: Once | ORAL | Status: AC
  Administered 2012-11-01: 30 g via ORAL
  Filled 2012-11-01: qty 120

## 2012-11-01 MED ORDER — INSULIN ASPART 100 UNIT/ML ~~LOC~~ SOLN
0.0000 [IU] | Freq: Three times a day (TID) | SUBCUTANEOUS | Status: DC
  Administered 2012-11-02 – 2012-11-03 (×2): 3 [IU] via SUBCUTANEOUS
  Administered 2012-11-03: 2 [IU] via SUBCUTANEOUS
  Administered 2012-11-04: 3 [IU] via SUBCUTANEOUS
  Administered 2012-11-04: 2 [IU] via SUBCUTANEOUS
  Administered 2012-11-04: 3 [IU] via SUBCUTANEOUS
  Administered 2012-11-05: 5 [IU] via SUBCUTANEOUS

## 2012-11-01 MED ORDER — ACETAMINOPHEN 325 MG PO TABS
650.0000 mg | ORAL_TABLET | Freq: Four times a day (QID) | ORAL | Status: DC | PRN
  Administered 2012-11-04: 650 mg via ORAL
  Filled 2012-11-01: qty 2

## 2012-11-01 MED ORDER — IOHEXOL 300 MG/ML  SOLN
50.0000 mL | Freq: Once | INTRAMUSCULAR | Status: AC | PRN
  Administered 2012-11-01: 50 mL via ORAL

## 2012-11-01 MED ORDER — FLUOCINONIDE 0.05 % EX GEL
Freq: Two times a day (BID) | CUTANEOUS | Status: DC
  Administered 2012-11-01 – 2012-11-05 (×7): via TOPICAL
  Filled 2012-11-01: qty 15

## 2012-11-01 MED ORDER — FERROUS SULFATE 27 MG PO TABS: 1.0000 | ORAL_TABLET | Freq: Two times a day (BID) | ORAL | Status: DC

## 2012-11-01 MED ORDER — ONDANSETRON HCL 4 MG PO TABS: 4.0000 mg | ORAL_TABLET | Freq: Four times a day (QID) | ORAL | Status: DC | PRN

## 2012-11-01 MED ORDER — ACETAMINOPHEN 650 MG RE SUPP: 650.0000 mg | Freq: Four times a day (QID) | RECTAL | Status: DC | PRN

## 2012-11-01 MED ORDER — ALUM & MAG HYDROXIDE-SIMETH 200-200-20 MG/5ML PO SUSP
30.0000 mL | Freq: Four times a day (QID) | ORAL | Status: DC | PRN
  Administered 2012-11-02: 30 mL via ORAL
  Filled 2012-11-01: qty 30

## 2012-11-01 MED ORDER — PANTOPRAZOLE SODIUM 40 MG PO TBEC
40.0000 mg | DELAYED_RELEASE_TABLET | Freq: Every day | ORAL | Status: DC
  Administered 2012-11-01 – 2012-11-05 (×5): 40 mg via ORAL
  Filled 2012-11-01 (×5): qty 1

## 2012-11-01 NOTE — H&P (Signed)
[facility] History and Physical  [name]:[id number] DOB: August 27, 1940 DOA: 11/01/2012  Referring physician:  PCP: Josue Hector, MD  Specialists:   Chief Complaint: tremors/abnormal labs  HPI: [name] is a 72 y.o. female with past medical hx of CAD, DM, CKD IV, anemia, hyperlipidemia, HTN, fibromyalgia who presents to ED with cc abnormal labs. Information obtained from pt and family who is at bedside. For past several days pt has experienced morning tremors of upper and lower extremities to the point that "i cant do nothing". The tremors seem to subside once she eats and takes her morning lyrica. Yesterday morning EMS was called and glucose found to be 60. She was treated and went to her PCP later in the day. Labs were drawn and some changes in diabetic meds made but have not been implemented. This morning, her glucose was 201 and she still had tremors that subsided when she ate and took lyrica. The PCP office called and instructed her to go to ED for high creatinine. During this time period she also experienced intermittent chest pain. States the pain was dull, non-radiating on the right anterior chest. She said she took NTG and the pain was relieved. She denies worsening sob, diaphoresis, palpitations, headache, nausea/vomiting. Her appetite has been poor lately so reports decrease of solid food but taking fluids well. Denies abdominal pain, constipation, diarrhea. Denies fever, chills sick contacts. In ED creatinine 3.5 m Hg 8.6, potassium 5.9. Chest xray with no active disease. Vital signs significant for HR 48-52. Symptoms came on gradually have persisted and worsened. Characterized as severe. [facility] asked to admit.   Review of Systems: The patient denies  fever, weight loss,, vision loss, decreased hearing, hoarseness,  syncope,  peripheral edema, balance deficits, hemoptysis, abdominal pain, melena, hematochezia, severe indigestion/heartburn, hematuria, incontinence, genital  sores,  suspicious skin lesions, transient blindness, unusual weight change, abnormal bleeding, enlarged lymph nodes, angioedema, and breast masses.    Past Medical History  Diagnosis Date  . Diabetes mellitus   . Hypertension   . Hyperlipemia   . Fibromyalgia   . Obesity   . Sleep apnea     No CPAP  . Anxiety   . Hiatal hernia   . NSTEMI (non-ST elevated myocardial infarction) 04/27/2011  . Anemia 04/27/2011  . Restless leg syndrome   . CAD (coronary artery disease)     small dominant right coronary artery with diffuse 95% stenosis. The LAD had 70% stenosis in the mid vessel beyond the diagonal branch but was a small vessel. Circumflex had proximal 30% stenosis.   . Renal insufficiency 05/06/2011  . External hemorrhoids   . Arrhythmia 10/11/2011  . Community acquired pneumonia 06/24/2012   Past Surgical History  Procedure Laterality Date  . Appendectomy    . Fracture surgery      left humerous  . Ganglion cyst removal     Social History:  reports that she has never smoked. She has never used smokeless tobacco. She reports that she does not drink alcohol or use illicit drugs.  Lives at home with son. Dependant with ADL's. Uses walker for ambulation. Has fallen 4 times in last 6 months due to LE weakness.   Allergies  Allergen Reactions  . Codeine Other (See Comments)    Childhood allergy    Family History  Problem Relation Age of Onset  . Stomach cancer Mother   . Heart disease Maternal Grandmother   . Heart disease Maternal Grandfather     Prior  to Admission medications   Medication Sig Start Date End Date Taking? Authorizing Provider  carvedilol (COREG) 6.25 MG tablet Take 6.25 mg by mouth 2 (two) times daily with a meal.   Yes Historical Provider, MD  cetirizine (ZYRTEC) 10 MG tablet Take 10 mg by mouth 2 (two) times daily.   Yes Historical Provider, MD  Cyanocobalamin 500 MCG SUBL Place 1 tablet under the tongue 2 (two) times daily.   Yes Historical Provider, MD   docusate sodium (COLACE) 100 MG capsule Take 100 mg by mouth 2 (two) times daily.    Yes Historical Provider, MD  doxycycline (MONODOX) 100 MG capsule Take 100 mg by mouth daily.    Yes Historical Provider, MD  Ferrous Sulfate 27 MG TABS Take 1 tablet by mouth 2 (two) times daily.   Yes Historical Provider, MD  furosemide (LASIX) 20 MG tablet Take 20 mg by mouth daily.   Yes Historical Provider, MD  glimepiride (AMARYL) 4 MG tablet Take 2 mg by mouth daily before breakfast. 10/10/12  Yes Erick Blinks, MD  isosorbide mononitrate (IMDUR) 30 MG 24 hr tablet Take 30 mg by mouth daily.   Yes Historical Provider, MD  lisinopril (PRINIVIL,ZESTRIL) 20 MG tablet Take 20 mg by mouth daily.   Yes Historical Provider, MD  pantoprazole (PROTONIX) 40 MG tablet Take 40 mg by mouth daily.   Yes Historical Provider, MD  pregabalin (LYRICA) 50 MG capsule Take 50 mg by mouth 2 (two) times daily.    Yes Historical Provider, MD  rosuvastatin (CRESTOR) 20 MG tablet Take 20 mg by mouth at bedtime.     Yes Historical Provider, MD  fluocinonide cream (LIDEX) 0.05 % Apply 1 application topically 2 (two) times daily as needed. Sore on leg    Historical Provider, MD  nitroGLYCERIN (NITROSTAT) 0.4 MG SL tablet Place 1 mg under the tongue every 5 (five) minutes x 3 doses as needed for chest pain. For chest pain    Historical Provider, MD   Physical Exam: Filed Vitals:   11/01/12 1046 11/01/12 1228 11/01/12 1300 11/01/12 1330  BP:  135/46 119/40 116/70  Pulse:  47 46   Temp: 95.8 F (35.4 C)     TempSrc: Rectal     Resp:  22 20 20   Height:      Weight:      SpO2:  99% 98% 98%     General:  Obese pale NAD  Eyes: PERRL EOMI no scleral icterus  ENT: ears clear, nose without drainage, oropharynx without exudate, mucus membrane mouth pale and somewhat dry  Neck: supple no JVD no lymphadenopathy  Cardiovascular: bradycardic regular no MGR trace LE edema  Respiratory: mild increased work of breathing with  conversation. BS clear bilaterally no wheeze no rhonchi  Abdomen: obese soft +BS non-tender to palpation no mass or organomegaly noted  Skin: pale cool slightly moist no rash no lesion. Right lower extremity with small abrasion on shin  Musculoskeletal: no clubbing no cyanosis  Psychiatric: calm cooperative   Neurologic: speech clear, facial symmetry cranial nerve II-XII intact  Labs on Admission:  Basic Metabolic Panel:  Recent Labs Lab 11/01/12 1052  NA 141  K 5.9*  CL 111  CO2 13*  GLUCOSE 98  BUN 103*  CREATININE 3.55*  CALCIUM 8.2*   Liver Function Tests:  Recent Labs Lab 11/01/12 1052  AST 33  ALT 56*  ALKPHOS 63  BILITOT 0.1*  PROT 6.5  ALBUMIN 2.8*    Recent Labs Lab 11/01/12  1052  LIPASE 130*   No results found for this basename: AMMONIA,  in the last 168 hours CBC:  Recent Labs Lab 11/01/12 1052  WBC 7.6  NEUTROABS 4.0  HGB 8.6*  HCT 26.7*  MCV 89.3  PLT 133*   Cardiac Enzymes:  Recent Labs Lab 11/01/12 1052  TROPONINI <0.30    BNP (last 3 results)  Recent Labs  06/24/12 1740 10/06/12 2057  PROBNP 4638.0* 6749.0*   CBG: No results found for this basename: GLUCAP,  in the last 168 hours  Radiological Exams on Admission: Dg Chest 2 View  11/01/2012   *RADIOLOGY REPORT*  Clinical Data: Fatigue.  CHEST - 2 VIEW  Comparison: 10/06/2012  Findings: Heart is mildly enlarged.  No edema, consolidation or pleural fluid is identified.  The bony thorax is unremarkable.  IMPRESSION: No active disease.   Original Report Authenticated By: Irish Lack, M.D.    EKG: Independently reviewed. SB with AV block  Assessment/Plan Principal Problem:   Acute on chronic renal failure: etiology uncertain. Will admit to SD. Will hold any nephrotoxins. Will gently hydrate with IV fluids. Urine output adequate. Hx CKD IV.  Recheck in am Active Problems:    Hyperkalemia: related to #1. Will give kayexelate and recheck. Monitor on tele.     High  anion gap metabolic acidosis: related to #1.  Gap 17. Will gently hydrate and monitor closely  Bradycardia: hx of same. Will hold BB and imdur for now. Cycle troponins and repeat EKG. Check TSH   Chest pain: atypical: Pt pain free in ED but has been having intermittent CP over last several days relieved with NTG. Will cycle cardiac enzymes, repeat EKG in am. Provide supportive care such as NTG, Morphine as needed. Provide oxygen.     Elevated lipase: denies abdominal pain or nausea. Exam benign. CT abdomen pending.     Elevated ALT measurement: etiology uncertain. Will hold statin.     DM type 2, uncontrolled, with neuropathy: will hold home meds. Check HgA1c and provide SSI for optimal glycemic control.       Anemia: Hg slightly below baseline. Heme neg. No s/sx active bleeding. Likely related to chronic disease. Monitor closely    Dyslipidemia: check lipid panel. Hold statin for now.    CAD (coronary artery disease): see #5.     Code Status: full Family Communication: daughter at bedside Disposition Plan: home when ready  Time spent: 60 minutes  [facility] M [facility]   If 7PM-7AM, please contact night-coverage [email or web address] Password [place] 11/01/2012, 4:03 PM   Attending note:  Patient seen and independently examined. Above note reviewed.  She's been admitted with acute on chronic renal failure. This may be related to her ACE inhibitor. She does not have any significant diarrhea or vomiting. She does not have any clear source of infection. At this time, we will provide gentle hydration. She does not have any evidence of renal obstruction on CT. Urinalysis is relatively unremarkable. We will hold ACE inhibitor. Request a nephrology consultation for assistance.  Patient has a high anion gap metabolic acidosis. We will check ABG to assess pH, check lactic acid, started a bicarbonate infusion. Blood sugar is in the lower side and may be related to her renal failure and  concomitant oral hypoglycemic. We will place her on sliding scale insulin for now  Etiology of her tremors not entirely clear. This may be related to uremia, although she does not appear to be set encephalopathic. We will hold her lyrica  for now and monitor for any changes.  She is noted to be significantly bradycardic. EKG indicates sinus bradycardia. She is on a beta blocker which will be held. TSH is also been ordered. Cardiac markers will be cycled.  For hyperkalemia she'll receive one dose of Kayexalate.  Her anemia appears to be of chronic disease, likely related to renal disease. Hemoglobin appears to be near baseline.  She does have an elevated lipase, CT does not show any evidence of pancreatitis. Clinically she does not have any symptoms of pancreatitis. Her elevated lipase may be secondary to decreased clearance and renal failure. We'll continue to monitor.  MEMON,JEHANZEB

## 2012-11-01 NOTE — ED Notes (Signed)
C/o HA - orders rec'd from EDP for tylenol 1g po.

## 2012-11-01 NOTE — Progress Notes (Signed)
DR MEMON TEXTED ABG RESULTS. PH 7.15  PCO2 31.9  PO2 110.  HCO3 10.9  SAT 97.3%

## 2012-11-01 NOTE — ED Notes (Signed)
Patient saw PMD yesterday and had labwork drawn.  Was called by office today and told to come to ER d/t abnormal renal panel results.  Patient expresses ongoing fatigue.

## 2012-11-01 NOTE — ED Notes (Signed)
Pt up ambulatory to bathroom with assistance of one. Urine specimen obtained and sent to lab.

## 2012-11-01 NOTE — ED Provider Notes (Signed)
History    This chart was scribed for Brenda Hutching, MD by Quintella Reichert, ED scribe.  This patient was seen in room [facility]18/[facility]18 and the patient's care was started at 10:59 AM.   CSN: [id number]  Arrival date & time 11/01/12  1024     Chief Complaint  Patient presents with  . Fatigue     The history is provided by the patient, a relative and medical records. No language interpreter was used.    HPI Comments: [name] is a 72 y.o. female with history of DM, renal insufficiency and anemia who presents to the Emergency Department complaining of abnormal lab results and fatigue.  Pt went to her PCP yesterday for a routine check-up and had lab-work drawn, and was advised today to come to the ED due to abnormal results that revealed anemia, elevated creatinine and low blood glucose.  Pt has had episodes of anemia in the past and also notes that fluctuations in BG level are not unusual for her.  She expresses fatigue and states "I just want to sleep."   Family reports that pt looked pale yesterday but presently appears somewhat improved.  Pt states she is still urinating normally.  She denies swelling in legs.  She also reports melena secondary to medication but states this is not new.  She notes that she stays by herself during the day but has family staying with her at night.  PCP is Dr. Lysbeth Galas   Past Medical History  Diagnosis Date  . Diabetes mellitus   . Hypertension   . Hyperlipemia   . Fibromyalgia   . Obesity   . Sleep apnea     No CPAP  . Anxiety   . Hiatal hernia   . NSTEMI (non-ST elevated myocardial infarction) 04/27/2011  . Anemia 04/27/2011  . Restless leg syndrome   . CAD (coronary artery disease)     small dominant right coronary artery with diffuse 95% stenosis. The LAD had 70% stenosis in the mid vessel beyond the diagonal branch but was a small vessel. Circumflex had proximal 30% stenosis.   . Renal insufficiency 05/06/2011  . External hemorrhoids   . Arrhythmia  10/11/2011  . Community acquired pneumonia 06/24/2012    Past Surgical History  Procedure Laterality Date  . Appendectomy    . Fracture surgery      left humerous  . Ganglion cyst removal      Family History  Problem Relation Age of Onset  . Stomach cancer Mother   . Heart disease Maternal Grandmother   . Heart disease Maternal Grandfather     History  Substance Use Topics  . Smoking status: Never Smoker   . Smokeless tobacco: Never Used  . Alcohol Use: No    OB History   Grav Para Term Preterm Abortions TAB SAB Ect Mult Living                  Review of Systems A complete 10 system review of systems was obtained and all systems are negative except as noted in the HPI and PMH.    Allergies  Codeine  Home Medications   Current Outpatient Rx  Name  Route  Sig  Dispense  Refill  . carvedilol (COREG) 6.25 MG tablet   Oral   Take 6.25 mg by mouth 2 (two) times daily with a meal.         . cetirizine (ZYRTEC) 10 MG tablet   Oral   Take 10 mg  by mouth 2 (two) times daily.         . Cyanocobalamin 500 MCG SUBL   Sublingual   Place 1 tablet under the tongue 2 (two) times daily.         Marland Kitchen docusate sodium (COLACE) 100 MG capsule   Oral   Take 100 mg by mouth 2 (two) times daily.          Marland Kitchen doxycycline (MONODOX) 100 MG capsule   Oral   Take 100 mg by mouth 2 (two) times daily.         . Ferrous Sulfate 27 MG TABS   Oral   Take 1 tablet by mouth 2 (two) times daily.         . fluocinonide cream (LIDEX) 0.05 %   Topical   Apply 1 application topically 2 (two) times daily.         . furosemide (LASIX) 40 MG tablet   Oral   Take 1 tablet (40 mg total) by mouth daily as needed (swelling).   30 tablet   0   . glimepiride (AMARYL) 4 MG tablet   Oral   Take 0.5 tablets (2 mg total) by mouth daily before breakfast.         . guaiFENesin-dextromethorphan (ROBITUSSIN DM) 100-10 MG/5ML syrup   Oral   Take 5 mLs by mouth every 4 (four) hours as  needed for cough.   240 mL   0   . iron polysaccharides (NIFEREX) 150 MG capsule   Oral   Take 1 capsule (150 mg total) by mouth 2 (two) times daily.   60 capsule   1   . isosorbide mononitrate (IMDUR) 30 MG 24 hr tablet   Oral   Take 30 mg by mouth daily.         Marland Kitchen lisinopril (PRINIVIL,ZESTRIL) 20 MG tablet   Oral   Take 20 mg by mouth daily.         . nitroGLYCERIN (NITROSTAT) 0.4 MG SL tablet   Sublingual   Place 1 mg under the tongue every 5 (five) minutes x 3 doses as needed for chest pain. For chest pain         . pantoprazole (PROTONIX) 40 MG tablet   Oral   Take 40 mg by mouth daily.         . pregabalin (LYRICA) 50 MG capsule   Oral   Take 50 mg by mouth 2 (two) times daily.          . rosuvastatin (CRESTOR) 20 MG tablet   Oral   Take 20 mg by mouth at bedtime.             BP 124/37  Pulse 47  Temp(Src) 96.8 F (36 C) (Oral)  Resp 18  Ht 5\' 3"  (1.6 m)  Wt 241 lb (109.317 kg)  BMI 42.7 kg/m2  SpO2 100%  Physical Exam  Nursing note and vitals reviewed. Constitutional: She is oriented to person, place, and time. She appears well-developed and well-nourished.  Slightly pale.  HENT:  Head: Normocephalic and atraumatic.  Eyes: Conjunctivae and EOM are normal. Pupils are equal, round, and reactive to light.  Neck: Normal range of motion. Neck supple.  Cardiovascular: Normal rate, regular rhythm and normal heart sounds.   Pulmonary/Chest: Effort normal and breath sounds normal.  Abdominal: Soft. Bowel sounds are normal. There is no tenderness.  Obese.  Genitourinary:  Rectal exam: No masses, heme-negative  Musculoskeletal: Normal range of motion.  Neurological: She is alert and oriented to person, place, and time.  Skin: Skin is warm and dry.  Psychiatric: She has a normal mood and affect.    ED Course  Procedures (including critical care time)  DIAGNOSTIC STUDIES: Oxygen Saturation is 100% on room air, [place] by my interpretation.     COORDINATION OF CARE: 11:04 AM-Discussed treatment plan which includes labs, CXR, and EKG with pt at bedside and pt agreed to plan.   Labs from 10/31/12 reviewed.  Abnormalities as follows: Glucose 61, creatinine 3.14, hemoglobin 9.3.   Results for orders placed during the hospital encounter of 11/01/12  CBC WITH DIFFERENTIAL      Result Value Range   WBC 7.6  4.0 - 10.5 K/uL   RBC 2.99 (*) 3.87 - 5.11 MIL/uL   Hemoglobin 8.6 (*) 12.0 - 15.0 g/dL   HCT 16.1 (*) 09.6 - 04.5 %   MCV 89.3  78.0 - 100.0 fL   MCH 28.8  26.0 - 34.0 pg   MCHC 32.2  30.0 - 36.0 g/dL   RDW 40.9 (*) 81.1 - 91.4 %   Platelets 133 (*) 150 - 400 K/uL   Neutrophils Relative % 52  43 - 77 %   Neutro Abs 4.0  1.7 - 7.7 K/uL   Lymphocytes Relative 37  12 - 46 %   Lymphs Abs 2.8  0.7 - 4.0 K/uL   Monocytes Relative 7  3 - 12 %   Monocytes Absolute 0.5  0.1 - 1.0 K/uL   Eosinophils Relative 4  0 - 5 %   Eosinophils Absolute 0.3  0.0 - 0.7 K/uL   Basophils Relative 0  0 - 1 %   Basophils Absolute 0.0  0.0 - 0.1 K/uL  BASIC METABOLIC PANEL      Result Value Range   Sodium 141  135 - 145 mEq/L   Potassium 5.9 (*) 3.5 - 5.1 mEq/L   Chloride 111  96 - 112 mEq/L   CO2 13 (*) 19 - 32 mEq/L   Glucose, Bld 98  70 - 99 mg/dL   BUN 782 (*) 6 - 23 mg/dL   Creatinine, Ser 9.56 (*) 0.50 - 1.10 mg/dL   Calcium 8.2 (*) 8.4 - 10.5 mg/dL   GFR calc non Af Amer 12 (*) >90 mL/min   GFR calc Af Amer 14 (*) >90 mL/min  HEPATIC FUNCTION PANEL      Result Value Range   Total Protein 6.5  6.0 - 8.3 g/dL   Albumin 2.8 (*) 3.5 - 5.2 g/dL   AST 33  0 - 37 U/L   ALT 56 (*) 0 - 35 U/L   Alkaline Phosphatase 63  39 - 117 U/L   Total Bilirubin 0.1 (*) 0.3 - 1.2 mg/dL   Bilirubin, Direct <2.1  0.0 - 0.3 mg/dL   Indirect Bilirubin NOT CALCULATED  0.3 - 0.9 mg/dL  LIPASE, BLOOD      Result Value Range   Lipase 130 (*) 11 - 59 U/L  URINALYSIS, ROUTINE W REFLEX MICROSCOPIC      Result Value Range   Color, Urine YELLOW  YELLOW    APPearance CLEAR  CLEAR   Specific Gravity, Urine 1.015  1.005 - 1.030   pH 5.5  5.0 - 8.0   Glucose, UA NEGATIVE  NEGATIVE mg/dL   Hgb urine dipstick NEGATIVE  NEGATIVE   Bilirubin Urine NEGATIVE  NEGATIVE   Ketones, ur NEGATIVE  NEGATIVE mg/dL   Protein, ur 308 (*)  NEGATIVE mg/dL   Urobilinogen, UA 0.2  0.0 - 1.0 mg/dL   Nitrite NEGATIVE  NEGATIVE   Leukocytes, UA NEGATIVE  NEGATIVE  TROPONIN I      Result Value Range   Troponin I <0.30  <0.30 ng/mL  URINE MICROSCOPIC-ADD ON      Result Value Range   Squamous Epithelial / LPF RARE  RARE   WBC, UA 0-2  <3 WBC/hpf   Casts GRANULAR CAST (*) NEGATIVE  OCCULT BLOOD, POC DEVICE      Result Value Range   Fecal Occult Bld NEGATIVE  NEGATIVE    Dg Chest 2 View  11/01/2012   *RADIOLOGY REPORT*  Clinical Data: Fatigue.  CHEST - 2 VIEW  Comparison: 10/06/2012  Findings: Heart is mildly enlarged.  No edema, consolidation or pleural fluid is identified.  The bony thorax is unremarkable.  IMPRESSION: No active disease.   Original Report Authenticated By: Irish Lack, M.D.   Dg Chest Portable 1 View  10/06/2012   *RADIOLOGY REPORT*  Clinical Data: Chest pain  PORTABLE CHEST - 1 VIEW  Comparison: 06/24/2012  Findings: Borderline cardiomegaly.  No acute infiltrate or pulmonary edema.  Bony thorax is stable.  IMPRESSION: Borderline cardiomegaly.  No active disease.   Original Report Authenticated By: Natasha Mead, M.D.      No diagnosis found.   Date: 11/01/2012  Rate: 48  Rhythm: sinus bradycardia  QRS Axis: normal  Intervals: normal  ST/T Wave abnormalities: normal  Conduction Disutrbances:first-degree A-V block   Narrative Interpretation:   Old EKG Reviewed: changes noted c sinus arrhythmia  MDM  Patient is a weak with multiple metabolic anomalies.   She is anemic with renal insufficiency and hyperkalemia.   Also bradycardia noted on EKG.   Admit to general medicine      I personally performed the services described in this  documentation, which was scribed in my presence. The recorded information has been reviewed and is accurate.    Brenda Hutching, MD 11/01/12 1504

## 2012-11-01 NOTE — Consult Note (Signed)
Reason for Consult: Worsening of renal failure and hyperkalemia Referring Physician: Dr.Memon  [name] is an 72 y.o. female.  HPI: She is a patient was history of chronic renal failure, diabetes, coronary artery disease recently discharged from the hospital presently came with complaints of increase tremor, poor appetite and weakness. According to patient she went to see her primary care physician and he did blood work and told her to come to the emergency room because of elevated BUN and creatinine and possibly potassium. Patient complains of poor appetite but she doesn't have any nausea or vomiting. She complains of also weakness and occasional difficulty in breathing. She is also intermittent substernal chest pain without radiation.  Past Medical History  Diagnosis Date  . Diabetes mellitus   . Hypertension   . Hyperlipemia   . Fibromyalgia   . Obesity   . Sleep apnea     No CPAP  . Anxiety   . Hiatal hernia   . NSTEMI (non-ST elevated myocardial infarction) 04/27/2011  . Anemia 04/27/2011  . Restless leg syndrome   . CAD (coronary artery disease)     small dominant right coronary artery with diffuse 95% stenosis. The LAD had 70% stenosis in the mid vessel beyond the diagonal branch but was a small vessel. Circumflex had proximal 30% stenosis.   . Renal insufficiency 05/06/2011  . External hemorrhoids   . Arrhythmia 10/11/2011  . Community acquired pneumonia 06/24/2012    Past Surgical History  Procedure Laterality Date  . Appendectomy    . Fracture surgery      left humerous  . Ganglion cyst removal      Family History  Problem Relation Age of Onset  . Stomach cancer Mother   . Heart disease Maternal Grandmother   . Heart disease Maternal Grandfather     Social History:  reports that she has never smoked. She has never used smokeless tobacco. She reports that she does not drink alcohol or use illicit drugs.  Allergies:  Allergies  Allergen Reactions  . Codeine  Other (See Comments)    Childhood allergy    Medications: I have reviewed the patient's current medications.  Results for orders placed during the hospital encounter of 11/01/12 (from the past 48 hour(s))  CBC WITH DIFFERENTIAL     Status: Abnormal   Collection Time    11/01/12 10:52 AM      Result Value Range   WBC 7.6  4.0 - 10.5 K/uL   RBC 2.99 (*) 3.87 - 5.11 MIL/uL   Hemoglobin 8.6 (*) 12.0 - 15.0 g/dL   HCT 78.2 (*) 95.6 - 21.3 %   MCV 89.3  78.0 - 100.0 fL   MCH 28.8  26.0 - 34.0 pg   MCHC 32.2  30.0 - 36.0 g/dL   RDW 08.6 (*) 57.8 - 46.9 %   Platelets 133 (*) 150 - 400 K/uL   Neutrophils Relative % 52  43 - 77 %   Neutro Abs 4.0  1.7 - 7.7 K/uL   Lymphocytes Relative 37  12 - 46 %   Lymphs Abs 2.8  0.7 - 4.0 K/uL   Monocytes Relative 7  3 - 12 %   Monocytes Absolute 0.5  0.1 - 1.0 K/uL   Eosinophils Relative 4  0 - 5 %   Eosinophils Absolute 0.3  0.0 - 0.7 K/uL   Basophils Relative 0  0 - 1 %   Basophils Absolute 0.0  0.0 - 0.1 K/uL  BASIC METABOLIC  PANEL     Status: Abnormal   Collection Time    11/01/12 10:52 AM      Result Value Range   Sodium 141  135 - 145 mEq/L   Potassium 5.9 (*) 3.5 - 5.1 mEq/L   Chloride 111  96 - 112 mEq/L   CO2 13 (*) 19 - 32 mEq/L   Glucose, Bld 98  70 - 99 mg/dL   BUN 478 (*) 6 - 23 mg/dL   Creatinine, Ser 2.95 (*) 0.50 - 1.10 mg/dL   Calcium 8.2 (*) 8.4 - 10.5 mg/dL   GFR calc non Af Amer 12 (*) >90 mL/min   GFR calc Af Amer 14 (*) >90 mL/min   Comment:            The eGFR has been calculated     using the CKD EPI equation.     This calculation has not been     validated in all clinical     situations.     eGFR's persistently     <90 mL/min signify     possible Chronic Kidney Disease.  HEPATIC FUNCTION PANEL     Status: Abnormal   Collection Time    11/01/12 10:52 AM      Result Value Range   Total Protein 6.5  6.0 - 8.3 g/dL   Albumin 2.8 (*) 3.5 - 5.2 g/dL   AST 33  0 - 37 U/L   ALT 56 (*) 0 - 35 U/L   Alkaline  Phosphatase 63  39 - 117 U/L   Total Bilirubin 0.1 (*) 0.3 - 1.2 mg/dL   Bilirubin, Direct <6.2  0.0 - 0.3 mg/dL   Indirect Bilirubin NOT CALCULATED  0.3 - 0.9 mg/dL  LIPASE, BLOOD     Status: Abnormal   Collection Time    11/01/12 10:52 AM      Result Value Range   Lipase 130 (*) 11 - 59 U/L  TROPONIN I     Status: None   Collection Time    11/01/12 10:52 AM      Result Value Range   Troponin I <0.30  <0.30 ng/mL   Comment:            Due to the release kinetics of cTnI,     a negative result within the first hours     of the onset of symptoms does not rule out     myocardial infarction with certainty.     If myocardial infarction is still suspected,     repeat the test at appropriate intervals.  LIPID PANEL     Status: None   Collection Time    11/01/12 10:52 AM      Result Value Range   Cholesterol 130  0 - 200 mg/dL   Triglycerides 36  <130 mg/dL   HDL 81  >86 mg/dL   Total CHOL/HDL Ratio 1.6     VLDL 7  0 - 40 mg/dL   LDL Cholesterol 42  0 - 99 mg/dL   Comment:            Total Cholesterol/HDL:CHD Risk     Coronary Heart Disease Risk Table                         Men   Women      1/2 Average Risk   3.4   3.3      Average Risk  5.0   4.4      2 X Average Risk   9.6   7.1      3 X Average Risk  23.4   11.0                Use the calculated Patient Ratio     above and the CHD Risk Table     to determine the patient's CHD Risk.                ATP III CLASSIFICATION (LDL):      <100     mg/dL   Optimal      161-096  mg/dL   Near or Above                        Optimal      130-159  mg/dL   Borderline      045-409  mg/dL   High      >811     mg/dL   Very High  OCCULT BLOOD, POC DEVICE     Status: None   Collection Time    11/01/12 11:14 AM      Result Value Range   Fecal Occult Bld NEGATIVE  NEGATIVE  URINALYSIS, ROUTINE W REFLEX MICROSCOPIC     Status: Abnormal   Collection Time    11/01/12 12:22 PM      Result Value Range   Color, Urine YELLOW  YELLOW    APPearance CLEAR  CLEAR   Specific Gravity, Urine 1.015  1.005 - 1.030   pH 5.5  5.0 - 8.0   Glucose, UA NEGATIVE  NEGATIVE mg/dL   Hgb urine dipstick NEGATIVE  NEGATIVE   Bilirubin Urine NEGATIVE  NEGATIVE   Ketones, ur NEGATIVE  NEGATIVE mg/dL   Protein, ur 914 (*) NEGATIVE mg/dL   Urobilinogen, UA 0.2  0.0 - 1.0 mg/dL   Nitrite NEGATIVE  NEGATIVE   Leukocytes, UA NEGATIVE  NEGATIVE  URINE MICROSCOPIC-ADD ON     Status: Abnormal   Collection Time    11/01/12 12:22 PM      Result Value Range   Squamous Epithelial / LPF RARE  RARE   WBC, UA 0-2  <3 WBC/hpf   Casts GRANULAR CAST (*) NEGATIVE  TROPONIN I     Status: None   Collection Time    11/01/12  4:00 PM      Result Value Range   Troponin I <0.30  <0.30 ng/mL   Comment:            Due to the release kinetics of cTnI,     a negative result within the first hours     of the onset of symptoms does not rule out     myocardial infarction with certainty.     If myocardial infarction is still suspected,     repeat the test at appropriate intervals.  GLUCOSE, CAPILLARY     Status: None   Collection Time    11/01/12  5:02 PM      Result Value Range   Glucose-Capillary 70  70 - 99 mg/dL  BLOOD GAS, ARTERIAL     Status: Abnormal   Collection Time    11/01/12  5:27 PM      Result Value Range   FIO2 0.21     O2 Content 21.0     Delivery systems ROOM AIR     pH, Arterial 7.159 (*) 7.350 - 7.450  Comment: CRITICAL RESULT CALLED TO, READ BACK BY AND VERIFIED WITH:     NEDINE ROWE,RN BY PEVIANY LAWSON,RRT ON 11/01/2012 AT 1740.   pCO2 arterial 31.9 (*) 35.0 - 45.0 mmHg   pO2, Arterial 110.0 (*) 80.0 - 100.0 mmHg   Bicarbonate 10.9 (*) 20.0 - 24.0 mEq/L   TCO2 10.9  0 - 100 mmol/L   Acid-base deficit 16.1 (*) 0.0 - 2.0 mmol/L   O2 Saturation 97.3     Patient temperature 37.0     Collection site RIGHT RADIAL     Drawn by [id number]     Sample type ARTERIAL     Allens test (pass/fail) PASS  PASS    Ct Abdomen Pelvis Wo  Contrast  11/01/2012   *RADIOLOGY REPORT*  Clinical Data: Elevated lipase and renal failure.  CT ABDOMEN AND PELVIS WITHOUT CONTRAST  Technique:  Multidetector CT imaging of the abdomen and pelvis was performed following the standard protocol without intravenous contrast.  Comparison: 03/12/2006  Findings: Unenhanced CT shows no overt inflammatory changes or abnormal fluid collection surrounding the pancreas.  The liver shows diffuse fatty infiltration.  No biliary ductal dilatation is identified.  The gallbladder is contracted.  The kidneys show no evidence of obstruction or calculi.  The spleen is unremarkable.  No adrenal masses are identified.  Bowel shows normal caliber without obstruction or inflammation.  No abnormal fluid collections are identified.  No hernias are seen.  The bladder is unremarkable.  No masses or enlarged lymph nodes are seen.  IMPRESSION: No pancreatic inflammation by unenhanced CT.  No evidence of renal obstruction.   Original Report Authenticated By: Irish Lack, M.D.   Dg Chest 2 View  11/01/2012   *RADIOLOGY REPORT*  Clinical Data: Fatigue.  CHEST - 2 VIEW  Comparison: 10/06/2012  Findings: Heart is mildly enlarged.  No edema, consolidation or pleural fluid is identified.  The bony thorax is unremarkable.  IMPRESSION: No active disease.   Original Report Authenticated By: Irish Lack, M.D.    Review of Systems  Respiratory: Positive for shortness of breath.   Cardiovascular: Positive for chest pain.  Gastrointestinal: Negative for nausea and vomiting.  Neurological: Positive for weakness.  Psychiatric/Behavioral: The patient is nervous/anxious.    Blood pressure 130/70, pulse 49, temperature 96.5 F (35.8 C), temperature source Oral, resp. rate 20, height 5\' 3"  (1.6 m), weight 115.7 kg (255 lb 1.2 oz), SpO2 96.00%. Physical Exam  Neck: No JVD present.  Cardiovascular: Normal rate and regular rhythm.   Respiratory: She has no wheezes. She has no rales.  GI: She  exhibits no distension. There is no tenderness. There is no rebound.  Musculoskeletal: She exhibits no edema.    Assessment/Plan: Problem #1 acute kidney injury superimposed on chronic possibly a compression of renal syndrome and also ACE inhibitor. Her BUN and creatinine presently has increased significantly from her baseline creatinine of about 2.5 which was discharged from the hospital. Problem #2 hyperkalemia possibly combination of ACE inhibitor, type IV RTA and high potassium diet. Problem #3 proteinuria none nephrotic range she has about 2.8 g of proteinuria during her last visit. It was thought to be secondary to diabetes. 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 She is also vitamin D deficiency. Plan: I agree with hydration and increased the IV fluid to 135  cc per hour. I agree with discontinuation of her lisinopril. Will start using some Lasix 80 mg IV twice a day to improve her urine output.  we will start her on ergocalciferol 50,000 units orally once a week. We'll check her basic metabolic panel, phosphorus and CBC in the morning.  Jenavie Stanczak S 11/01/2012, 6:28 PM

## 2012-11-01 NOTE — ED Notes (Signed)
Reports "a little bit" of dull right sided chest pain.  EDP notified - placed on O2 2L/min per [place]. Will continue to monitor.

## 2012-11-02 DIAGNOSIS — E872 Acidosis: Secondary | ICD-10-CM

## 2012-11-02 LAB — RETICULOCYTES: Retic Count, Absolute: 47.9 10*3/uL (ref 19.0–186.0)

## 2012-11-02 LAB — GLUCOSE, CAPILLARY
Glucose-Capillary: 73 mg/dL (ref 70–99)
Glucose-Capillary: 101 mg/dL — ABNORMAL HIGH (ref 70–99)
Glucose-Capillary: 156 mg/dL — ABNORMAL HIGH (ref 70–99)
Glucose-Capillary: 173 mg/dL — ABNORMAL HIGH (ref 70–99)
Glucose-Capillary: 142 mg/dL — ABNORMAL HIGH (ref 70–99)
Glucose-Capillary: 198 mg/dL — ABNORMAL HIGH (ref 70–99)

## 2012-11-02 LAB — BASIC METABOLIC PANEL
CO2: 13 mEq/L — ABNORMAL LOW (ref 19–32)
Chloride: 107 mEq/L (ref 96–112)
Glucose, Bld: 38 mg/dL — CL (ref 70–99)
Potassium: 5.1 mEq/L (ref 3.5–5.1)
Sodium: 138 mEq/L (ref 135–145)

## 2012-11-02 LAB — CBC
Hemoglobin: 8.7 g/dL — ABNORMAL LOW (ref 12.0–15.0)
MCH: 28.4 pg (ref 26.0–34.0)
MCV: 88.2 fL (ref 78.0–100.0)
RBC: 3.06 MIL/uL — ABNORMAL LOW (ref 3.87–5.11)

## 2012-11-02 LAB — FOLATE: Folate: 12.9 ng/mL

## 2012-11-02 LAB — FERRITIN: Ferritin: 74 ng/mL (ref 10–291)

## 2012-11-02 LAB — TSH: TSH: 3.25 u[IU]/mL (ref 0.350–4.500)

## 2012-11-02 LAB — IRON AND TIBC: Iron: 40 ug/dL — ABNORMAL LOW (ref 42–135)

## 2012-11-02 MED ORDER — POLYSACCHARIDE IRON COMPLEX 150 MG PO CAPS
150.0000 mg | ORAL_CAPSULE | Freq: Two times a day (BID) | ORAL | Status: DC
  Administered 2012-11-02 – 2012-11-05 (×7): 150 mg via ORAL
  Filled 2012-11-02 (×7): qty 1

## 2012-11-02 MED ORDER — HYDROCODONE-ACETAMINOPHEN 5-325 MG PO TABS
1.0000 | ORAL_TABLET | Freq: Four times a day (QID) | ORAL | Status: DC | PRN
  Administered 2012-11-02 – 2012-11-03 (×3): 1 via ORAL
  Filled 2012-11-02 (×3): qty 1

## 2012-11-02 MED ORDER — SODIUM CHLORIDE 0.45 % IV SOLN
INTRAVENOUS | Status: DC
  Administered 2012-11-02 – 2012-11-03 (×3): via INTRAVENOUS
  Filled 2012-11-02 (×8): qty 1000

## 2012-11-02 MED ORDER — FUROSEMIDE 10 MG/ML IJ SOLN
100.0000 mg | Freq: Two times a day (BID) | INTRAVENOUS | Status: DC
  Administered 2012-11-02 – 2012-11-03 (×3): 100 mg via INTRAVENOUS
  Filled 2012-11-02 (×4): qty 10

## 2012-11-02 MED ORDER — CALCIUM ACETATE 667 MG PO CAPS
1334.0000 mg | ORAL_CAPSULE | Freq: Three times a day (TID) | ORAL | Status: DC
  Administered 2012-11-02 – 2012-11-05 (×9): 1334 mg via ORAL
  Filled 2012-11-02 (×9): qty 2

## 2012-11-02 MED ORDER — SODIUM BICARBONATE 8.4 % IV SOLN
INTRAVENOUS | Status: AC
  Filled 2012-11-02: qty 50

## 2012-11-02 NOTE — Progress Notes (Signed)
[facility] PROGRESS NOTE  [name] GMW:[id number] DOB: 1940/07/21 DOA: 11/01/2012 PCP: Josue Hector, MD  Assessment/Plan: 1. Acute on chronic renal failure. Possibly related to ACE inhibitor. Patient was started on IV hydration and is also receiving IV Lasix. Her lisinopril has been discontinued. Nephrology is following. We'll continue to monitor renal function. 2. Hyperkalemia. Improved with Kayexalate. 3. High anion gap metabolic acidosis. Likely related to renal failure. Lactic acid is normal. The patient was started on bicarbonate infusion. 4. Bradycardia. Patient was on beta blocker prior to admission. This has been held. Cardiac enzymes have been negative thus far. TSH is pending. 5. Type 2 diabetes. Patient was noted to be significantly hypoglycemic. She is on oral hypoglycemic agents which effects have likely been prolonged due to worsening renal failure.. These have been held. Will follow CBG every 4 hours for today to ensure stability. 6. Anemia, likely due to chronic renal disease. Check anemia panel for an element of iron deficiency. 7. Generalized weakness. Likely multifactorial due to chronic debility, anemia, renal failure, morbid obesity  Code Status: DNR Family Communication: discussed with patient Disposition Plan: pending hospital course, continue to monitor in the step down unit   Consultants:  Nephrology  Procedures:  none  Antibiotics:  none  HPI/Subjective: Patient reports feeling a little better today.  No shortness of breath.  She feels tremors are improving. She was noted to be significantly hypoglycemic overnight.  Objective: Filed Vitals:   11/02/12 0630 11/02/12 0645 11/02/12 0700 11/02/12 0800  BP: 108/85  103/49 98/47  Pulse: 51   42  Temp:      TempSrc:      Resp: 19 14 12 18   Height:      Weight:      SpO2: 75%   80%    Intake/Output Summary (Last 24 hours) at 11/02/12 0919 Last data filed at 11/02/12 0830  Gross per  24 hour  Intake 2201.42 ml  Output    800 ml  Net 1401.42 ml   Filed Weights   11/01/12 1029 11/01/12 1648 11/02/12 0500  Weight: 109.317 kg (241 lb) 115.7 kg (255 lb 1.2 oz) 118.3 kg (260 lb 12.9 oz)    Exam:   General:  NAD  Cardiovascular: S1, S2 RRR  Respiratory: CTA B  Abdomen: soft, obese, nt, bs+  Musculoskeletal: trace edema b/l   Data Reviewed: Basic Metabolic Panel:  Recent Labs Lab 11/01/12 1052 11/02/12 0355  NA 141 138  K 5.9* 5.1  CL 111 107  CO2 13* 13*  GLUCOSE 98 38*  BUN 103* 100*  CREATININE 3.55* 3.52*  CALCIUM 8.2* 7.8*  PHOS  --  7.4*   Liver Function Tests:  Recent Labs Lab 11/01/12 1052  AST 33  ALT 56*  ALKPHOS 63  BILITOT 0.1*  PROT 6.5  ALBUMIN 2.8*    Recent Labs Lab 11/01/12 1052  LIPASE 130*   No results found for this basename: AMMONIA,  in the last 168 hours CBC:  Recent Labs Lab 11/01/12 1052 11/02/12 0355  WBC 7.6 7.8  NEUTROABS 4.0  --   HGB 8.6* 8.7*  HCT 26.7* 27.0*  MCV 89.3 88.2  PLT 133* 135*   Cardiac Enzymes:  Recent Labs Lab 11/01/12 1052 11/01/12 1600 11/01/12 2144 11/02/12 0355  TROPONINI <0.30 <0.30 <0.30 <0.30   BNP (last 3 results)  Recent Labs  06/24/12 1740 10/06/12 2057  PROBNP 4638.0* 6749.0*   CBG:  Recent Labs Lab 11/01/12 2119 11/02/12 0512 11/02/12 0534 11/02/12  0603 11/02/12 0741  GLUCAP 112* 26* 53* 73 101*    Recent Results (from the past 240 hour(s))  MRSA PCR SCREENING     Status: None   Collection Time    11/01/12  7:05 PM      Result Value Range Status   MRSA by PCR NEGATIVE  NEGATIVE Final   Comment:            The GeneXpert MRSA Assay (FDA     approved for NASAL specimens     only), is one component of a     comprehensive MRSA colonization     surveillance program. It is not     intended to diagnose MRSA     infection nor to guide or     monitor treatment for     MRSA infections.     Studies: Ct Abdomen Pelvis Wo  Contrast  11/01/2012   *RADIOLOGY REPORT*  Clinical Data: Elevated lipase and renal failure.  CT ABDOMEN AND PELVIS WITHOUT CONTRAST  Technique:  Multidetector CT imaging of the abdomen and pelvis was performed following the standard protocol without intravenous contrast.  Comparison: 03/12/2006  Findings: Unenhanced CT shows no overt inflammatory changes or abnormal fluid collection surrounding the pancreas.  The liver shows diffuse fatty infiltration.  No biliary ductal dilatation is identified.  The gallbladder is contracted.  The kidneys show no evidence of obstruction or calculi.  The spleen is unremarkable.  No adrenal masses are identified.  Bowel shows normal caliber without obstruction or inflammation.  No abnormal fluid collections are identified.  No hernias are seen.  The bladder is unremarkable.  No masses or enlarged lymph nodes are seen.  IMPRESSION: No pancreatic inflammation by unenhanced CT.  No evidence of renal obstruction.   Original Report Authenticated By: Irish Lack, M.D.   Dg Chest 2 View  11/01/2012   *RADIOLOGY REPORT*  Clinical Data: Fatigue.  CHEST - 2 VIEW  Comparison: 10/06/2012  Findings: Heart is mildly enlarged.  No edema, consolidation or pleural fluid is identified.  The bony thorax is unremarkable.  IMPRESSION: No active disease.   Original Report Authenticated By: Irish Lack, M.D.    Scheduled Meds: . aspirin EC  325 mg Oral Daily  . ferrous sulfate  325 mg Oral Q breakfast  . fluocinonide gel   Topical BID  . furosemide  80 mg Intravenous BID  . heparin  5,000 Units Subcutaneous Q8H  . insulin aspart  0-15 Units Subcutaneous TID WC  . pantoprazole  40 mg Oral Daily   Continuous Infusions: . sodium chloride 0.45 % 1,000 mL with sodium bicarbonate 100 mEq infusion      Principal Problem:   Acute on chronic renal failure Active Problems:   Anemia   Dyslipidemia   CAD (coronary artery disease)   Morbid obesity   Hyperkalemia   High anion gap  metabolic acidosis   DM type 2, uncontrolled, with neuropathy   Bradycardia   Chest pain   Elevated lipase   Elevated ALT measurement    Time spent:    Cj Beecher  [facility] Pager [phone]. If 7PM-7AM, please contact night-coverage at [email or web address], password [facility] 11/02/2012, 9:19 AM  LOS: 1 day

## 2012-11-02 NOTE — Progress Notes (Signed)
Subjective: Interval History: has complaints weakness but improving. She denies any nausea or vomiting. Patient also denies any difficulty breathing..  Objective: Vital signs in last 24 hours: Temp:  [95.7 F (35.4 C)-97.9 F (36.6 C)] 97.9 F (36.6 C) (05/31 0400) Pulse Rate:  [41-71] 42 (05/31 0800) Resp:  [11-23] 18 (05/31 0800) BP: (65-135)/(33-101) 98/47 mmHg (05/31 0800) SpO2:  [75 %-100 %] 80 % (05/31 0800) Weight:  [109.317 kg (241 lb)-118.3 kg (260 lb 12.9 oz)] 118.3 kg (260 lb 12.9 oz) (05/31 0500) Weight change:   Intake/Output from previous day: 05/30 0701 - 05/31 0700 In: 2066.4 [P.O.:240; I.V.:1826.4] Out: 400 [Urine:400] Intake/Output this shift: Total I/O In: 135 [I.V.:135] Out: 400 [Urine:400]  General appearance: alert, cooperative and no distress Resp: diminished breath sounds posterior - bilateral and dullness to percussion bilaterally Cardio: regular rate and rhythm, S1, S2 normal, no murmur, click, rub or gallop GI: soft, non-tender; bowel sounds normal; no masses,  no organomegaly Extremities: edema 1-2+ edema  Lab Results:  Recent Labs  11/01/12 1052 11/02/12 0355  WBC 7.6 7.8  HGB 8.6* 8.7*  HCT 26.7* 27.0*  PLT 133* 135*   BMET:  Recent Labs  11/01/12 1052 11/02/12 0355  NA 141 138  K 5.9* 5.1  CL 111 107  CO2 13* 13*  GLUCOSE 98 38*  BUN 103* 100*  CREATININE 3.55* 3.52*  CALCIUM 8.2* 7.8*   No results found for this basename: PTH,  in the last 72 hours Iron Studies: No results found for this basename: IRON, TIBC, TRANSFERRIN, FERRITIN,  in the last 72 hours  Studies/Results: Ct Abdomen Pelvis Wo Contrast  11/01/2012   *RADIOLOGY REPORT*  Clinical Data: Elevated lipase and renal failure.  CT ABDOMEN AND PELVIS WITHOUT CONTRAST  Technique:  Multidetector CT imaging of the abdomen and pelvis was performed following the standard protocol without intravenous contrast.  Comparison: 03/12/2006  Findings: Unenhanced CT shows no overt  inflammatory changes or abnormal fluid collection surrounding the pancreas.  The liver shows diffuse fatty infiltration.  No biliary ductal dilatation is identified.  The gallbladder is contracted.  The kidneys show no evidence of obstruction or calculi.  The spleen is unremarkable.  No adrenal masses are identified.  Bowel shows normal caliber without obstruction or inflammation.  No abnormal fluid collections are identified.  No hernias are seen.  The bladder is unremarkable.  No masses or enlarged lymph nodes are seen.  IMPRESSION: No pancreatic inflammation by unenhanced CT.  No evidence of renal obstruction.   Original Report Authenticated By: Irish Lack, M.D.   Dg Chest 2 View  11/01/2012   *RADIOLOGY REPORT*  Clinical Data: Fatigue.  CHEST - 2 VIEW  Comparison: 10/06/2012  Findings: Heart is mildly enlarged.  No edema, consolidation or pleural fluid is identified.  The bony thorax is unremarkable.  IMPRESSION: No active disease.   Original Report Authenticated By: Irish Lack, M.D.    I have reviewed the patient's current medications.  Assessment/Plan: Problem #1 renal failure most likely acute on chronic BUN is 100 creatinine 3.52 renal function seems to be improving. She has underlying chronic renal failure. Problem #2 metabolic acidosis she is on sodium bicarbonate CO2 is 13 slightly better. Problem #3 anemia her hemoglobin and hematocrit is low. Patient presently on iron supplement. Problem #4 hyperkalemia potassium has improved Problem #5 type 2 diabetes Problem #6 bradycardia Problem #7 coronary artery disease today she doesn't have any chest pain. Problem #8 metabolic bone disease phosphorus presently high Plan: We'll  start patient on PhosLo 667 mg 2 tablet by mouth 3 times a day with meals. We'll DC ferrous sulfate We'll start her on Nu-Iron 150 mg by mouth twice a day We'll increase Lasix to 100 mg IV twice a day We'll increase sodium bicarbonate to 100 milliequivalents in  her IV fluid and continue to present rate We'll check her basic metabolic panel in the morning.    LOS: 1 day   Toron Bowring S 11/02/2012,9:20 AM

## 2012-11-02 NOTE — Progress Notes (Signed)
Lab called to report serum glucose of 38. Fingerstick revealed 26. Pt alert and oriented, skin warm and dry.  Hypoglycemic protocol initiated. Snack provided. Pt able to feed herself. Continue to monitor

## 2012-11-02 NOTE — Progress Notes (Signed)
Pt c/o "feeling shakey." Mild tremors visible in both arms and legs. Blood glucose 173 mg/dl. BP 133/64, HR 61, RR 16, O2 sat 92% on 3l, T 97.18F. Patient says she has episodes like this at home.

## 2012-11-03 DIAGNOSIS — E1149 Type 2 diabetes mellitus with other diabetic neurological complication: Secondary | ICD-10-CM

## 2012-11-03 DIAGNOSIS — E1142 Type 2 diabetes mellitus with diabetic polyneuropathy: Secondary | ICD-10-CM

## 2012-11-03 LAB — GLUCOSE, CAPILLARY
Glucose-Capillary: 133 mg/dL — ABNORMAL HIGH (ref 70–99)
Glucose-Capillary: 144 mg/dL — ABNORMAL HIGH (ref 70–99)
Glucose-Capillary: 147 mg/dL — ABNORMAL HIGH (ref 70–99)
Glucose-Capillary: 82 mg/dL (ref 70–99)

## 2012-11-03 LAB — CBC
Hemoglobin: 9.3 g/dL — ABNORMAL LOW (ref 12.0–15.0)
MCH: 28.2 pg (ref 26.0–34.0)
MCHC: 32.4 g/dL (ref 30.0–36.0)
Platelets: 155 10*3/uL (ref 150–400)
RBC: 3.3 MIL/uL — ABNORMAL LOW (ref 3.87–5.11)

## 2012-11-03 LAB — BASIC METABOLIC PANEL
BUN: 91 mg/dL — ABNORMAL HIGH (ref 6–23)
Calcium: 7.6 mg/dL — ABNORMAL LOW (ref 8.4–10.5)
GFR calc Af Amer: 16 mL/min — ABNORMAL LOW (ref >90)
GFR calc non Af Amer: 14 mL/min — ABNORMAL LOW (ref >90)
Potassium: 4.3 mEq/L (ref 3.5–5.1)
Sodium: 141 mEq/L (ref 135–145)

## 2012-11-03 MED ORDER — SODIUM CHLORIDE 0.45 % IV SOLN
INTRAVENOUS | Status: DC
  Administered 2012-11-03 (×2): via INTRAVENOUS

## 2012-11-03 MED ORDER — SODIUM BICARBONATE 650 MG PO TABS
650.0000 mg | ORAL_TABLET | Freq: Three times a day (TID) | ORAL | Status: DC
  Administered 2012-11-03 (×4): 650 mg via ORAL
  Filled 2012-11-03 (×4): qty 1

## 2012-11-03 NOTE — Progress Notes (Signed)
Subjective: Interval History: has complaints weakness but improving. She denies any nausea or vomiting. Patient also denies any difficulty breathing..  Objective: Vital signs in last 24 hours: Temp:  [97.4 F (36.3 C)-98.1 F (36.7 C)] 98 F (36.7 C) (06/01 0400) Pulse Rate:  [51-65] 65 (06/01 0008) Resp:  [9-22] 16 (06/01 0800) BP: (62-157)/(21-94) 148/67 mmHg (06/01 0800) SpO2:  [76 %-100 %] 76 % (06/01 0008) Weight:  [116.9 kg (257 lb 11.5 oz)] 116.9 kg (257 lb 11.5 oz) (06/01 0500) Weight change: 7.583 kg (16 lb 11.5 oz)  Intake/Output from previous day: 05/31 0701 - 06/01 0700 In: 4560 [P.O.:1320; I.V.:3240] Out: 4950 [Urine:4950] Intake/Output this shift: Total I/O In: 665 [P.O.:480; I.V.:135; IV Piggyback:50] Out: 200 [Urine:200]  General appearance: alert, cooperative and no distress Resp: diminished breath sounds posterior - bilateral and dullness to percussion bilaterally Cardio: regular rate and rhythm, S1, S2 normal, no murmur, click, rub or gallop GI: soft, non-tender; bowel sounds normal; no masses,  no organomegaly Extremities: edema 1-2+ edema  Lab Results:  Recent Labs  11/02/12 0355 11/03/12 0518  WBC 7.8 7.5  HGB 8.7* 9.3*  HCT 27.0* 28.7*  PLT 135* 155   BMET:   Recent Labs  11/02/12 0355 11/03/12 0518  NA 138 141  K 5.1 4.3  CL 107 104  CO2 13* 26  GLUCOSE 38* 80  BUN 100* 91*  CREATININE 3.52* 3.16*  CALCIUM 7.8* 7.6*   No results found for this basename: PTH,  in the last 72 hours Iron Studies:   Recent Labs  11/02/12 1055  IRON 40*  TIBC 274  FERRITIN 74    Studies/Results: Ct Abdomen Pelvis Wo Contrast  11/01/2012   *RADIOLOGY REPORT*  Clinical Data: Elevated lipase and renal failure.  CT ABDOMEN AND PELVIS WITHOUT CONTRAST  Technique:  Multidetector CT imaging of the abdomen and pelvis was performed following the standard protocol without intravenous contrast.  Comparison: 03/12/2006  Findings: Unenhanced CT shows no  overt inflammatory changes or abnormal fluid collection surrounding the pancreas.  The liver shows diffuse fatty infiltration.  No biliary ductal dilatation is identified.  The gallbladder is contracted.  The kidneys show no evidence of obstruction or calculi.  The spleen is unremarkable.  No adrenal masses are identified.  Bowel shows normal caliber without obstruction or inflammation.  No abnormal fluid collections are identified.  No hernias are seen.  The bladder is unremarkable.  No masses or enlarged lymph nodes are seen.  IMPRESSION: No pancreatic inflammation by unenhanced CT.  No evidence of renal obstruction.   Original Report Authenticated By: Irish Lack, M.D.   Dg Chest 2 View  11/01/2012   *RADIOLOGY REPORT*  Clinical Data: Fatigue.  CHEST - 2 VIEW  Comparison: 10/06/2012  Findings: Heart is mildly enlarged.  No edema, consolidation or pleural fluid is identified.  The bony thorax is unremarkable.  IMPRESSION: No active disease.   Original Report Authenticated By: Irish Lack, M.D.    I have reviewed the patient'Weeks current medications.  Assessment/Plan: Problem #1 renal failure most likely acute on chronic BUN is 91 and creatinine 3.16 renal function seems to be improving. She has underlying chronic renal failure. Problem #2 metabolic acidosis she is on sodium bicarbonate CO2 is 26 better. Problem #3 anemia her hemoglobin and hematocrit is low. Patient presently on iron supplement. Problem #4 hyperkalemia potassium has improved Problem #5 type 2 diabetes Problem #6 bradycardia Problem #7 coronary artery disease today she doesn't have any chest pain. Problem #8  metabolic bone disease phosphorus presently high but improving We'll check her basic metabolic panel in the morning. We'll DC IV fluid with sodium bicarbonate. We'll start her and sodium bicarbonate 650 mg one tablet 3 times a day. We'll continue with PhosLo. We'll start one half normal saline at 75 cc per hour.     LOS: 2 days   [name] 11/03/2012,8:52 AM

## 2012-11-03 NOTE — Progress Notes (Signed)
[facility] PROGRESS NOTE  [name]:[id number] DOB: 09-23-1940 DOA: 11/01/2012 PCP: Josue Hector, MD  Assessment/Plan: 1. Acute on chronic renal failure. Possibly related to ACE inhibitor. Patient was started on IV hydration and is also receiving IV Lasix. Her lisinopril has been discontinued. Nephrology is following. We'll continue to monitor renal function. She has good urine output and creatinine is improving. 2. Hyperkalemia. Improved with Kayexalate. 3. High anion gap metabolic acidosis. Likely related to renal failure. Lactic acid is normal. The patient was started on bicarbonate infusion. This is now resolved.  She has been continued on po bicarbonate. 4. Bradycardia, EKG shows sinus bradycardia.  This is now stable/improving. Patient was on beta blocker prior to admission. This has been held. Cardiac enzymes have been negative thus far. TSH is normal. 5. Type 2 diabetes. Patient was noted to be significantly hypoglycemic. She is on oral hypoglycemic agents which effects have likely been prolonged due to worsening renal failure. These have been held. Will follow CBG every 4 hours for today to ensure stability. 6. Anemia, likely due to chronic renal disease. Anemia panel has indicated and element of iron deficiency. She has been started on oral iron.  Hemoglobin is stable. 7. Generalized weakness. Likely multifactorial due to chronic debility, anemia, renal failure, morbid obesity.  Will request physical therapy consult. 8. Tremors, possibly myoclonic jerks due to uremia.  Will continue to follow.  Code Status: DNR Family Communication: discussed with patient Disposition Plan: transfer to telemetry today   Consultants:  Nephrology  Procedures:  none  Antibiotics:  none  HPI/Subjective: Patient is feeling better, no shortness of breath or chest pain.  Objective: Filed Vitals:   11/03/12 0357 11/03/12 0400 11/03/12 0500 11/03/12 0800  BP: 119/57   148/67   Pulse:      Temp:  98 F (36.7 C)    TempSrc:  Oral    Resp: 14 17 15 16   Height:      Weight:   116.9 kg (257 lb 11.5 oz)   SpO2:        Intake/Output Summary (Last 24 hours) at 11/03/12 0939 Last data filed at 11/03/12 0800  Gross per 24 hour  Intake   4475 ml  Output   4550 ml  Net    -75 ml   Filed Weights   11/01/12 1648 11/02/12 0500 11/03/12 0500  Weight: 115.7 kg (255 lb 1.2 oz) 118.3 kg (260 lb 12.9 oz) 116.9 kg (257 lb 11.5 oz)    Exam:   General:  NAD  Cardiovascular: S1, S2 RRR  Respiratory: CTA B  Abdomen: soft, obese, nt, bs+  Musculoskeletal: trace edema b/l   Data Reviewed: Basic Metabolic Panel:  Recent Labs Lab 11/01/12 1052 11/02/12 0355 11/03/12 0518  NA 141 138 141  K 5.9* 5.1 4.3  CL 111 107 104  CO2 13* 13* 26  GLUCOSE 98 38* 80  BUN 103* 100* 91*  CREATININE 3.55* 3.52* 3.16*  CALCIUM 8.2* 7.8* 7.6*  PHOS  --  7.4* 6.2*   Liver Function Tests:  Recent Labs Lab 11/01/12 1052  AST 33  ALT 56*  ALKPHOS 63  BILITOT 0.1*  PROT 6.5  ALBUMIN 2.8*    Recent Labs Lab 11/01/12 1052  LIPASE 130*   No results found for this basename: AMMONIA,  in the last 168 hours CBC:  Recent Labs Lab 11/01/12 1052 11/02/12 0355 11/03/12 0518  WBC 7.6 7.8 7.5  NEUTROABS 4.0  --   --  HGB 8.6* 8.7* 9.3*  HCT 26.7* 27.0* 28.7*  MCV 89.3 88.2 87.0  PLT 133* 135* 155   Cardiac Enzymes:  Recent Labs Lab 11/01/12 1052 11/01/12 1600 11/01/12 2144 11/02/12 0355  TROPONINI <0.30 <0.30 <0.30 <0.30   BNP (last 3 results)  Recent Labs  06/24/12 1740 10/06/12 2057  PROBNP 4638.0* 6749.0*   CBG:  Recent Labs Lab 11/02/12 1805 11/02/12 2007 11/03/12 0005 11/03/12 0355 11/03/12 0743  GLUCAP 142* 198* 133* 87 82    Recent Results (from the past 240 hour(s))  MRSA PCR SCREENING     Status: None   Collection Time    11/01/12  7:05 PM      Result Value Range Status   MRSA by PCR NEGATIVE  NEGATIVE Final   Comment:             The GeneXpert MRSA Assay (FDA     approved for NASAL specimens     only), is one component of a     comprehensive MRSA colonization     surveillance program. It is not     intended to diagnose MRSA     infection nor to guide or     monitor treatment for     MRSA infections.     Studies: Ct Abdomen Pelvis Wo Contrast  11/01/2012   *RADIOLOGY REPORT*  Clinical Data: Elevated lipase and renal failure.  CT ABDOMEN AND PELVIS WITHOUT CONTRAST  Technique:  Multidetector CT imaging of the abdomen and pelvis was performed following the standard protocol without intravenous contrast.  Comparison: 03/12/2006  Findings: Unenhanced CT shows no overt inflammatory changes or abnormal fluid collection surrounding the pancreas.  The liver shows diffuse fatty infiltration.  No biliary ductal dilatation is identified.  The gallbladder is contracted.  The kidneys show no evidence of obstruction or calculi.  The spleen is unremarkable.  No adrenal masses are identified.  Bowel shows normal caliber without obstruction or inflammation.  No abnormal fluid collections are identified.  No hernias are seen.  The bladder is unremarkable.  No masses or enlarged lymph nodes are seen.  IMPRESSION: No pancreatic inflammation by unenhanced CT.  No evidence of renal obstruction.   Original Report Authenticated By: Irish Lack, M.D.   Dg Chest 2 View  11/01/2012   *RADIOLOGY REPORT*  Clinical Data: Fatigue.  CHEST - 2 VIEW  Comparison: 10/06/2012  Findings: Heart is mildly enlarged.  No edema, consolidation or pleural fluid is identified.  The bony thorax is unremarkable.  IMPRESSION: No active disease.   Original Report Authenticated By: Irish Lack, M.D.    Scheduled Meds: . aspirin EC  325 mg Oral Daily  . calcium acetate  1,334 mg Oral TID WC  . fluocinonide gel   Topical BID  . furosemide  100 mg Intravenous BID  . heparin  5,000 Units Subcutaneous Q8H  . insulin aspart  0-15 Units Subcutaneous TID WC   . iron polysaccharides  150 mg Oral BID  . pantoprazole  40 mg Oral Daily  . sodium bicarbonate  650 mg Oral TID   Continuous Infusions: . sodium chloride 75 mL/hr at 11/03/12 4098    Principal Problem:   Acute on chronic renal failure Active Problems:   Anemia   Dyslipidemia   CAD (coronary artery disease)   Morbid obesity   Hyperkalemia   High anion gap metabolic acidosis   DM type 2, uncontrolled, with neuropathy   Bradycardia   Chest pain   Elevated lipase  Elevated ALT measurement    Time spent:    Tresha Muzio  [facility] Pager [phone]. If 7PM-7AM, please contact night-coverage at [email or web address], password [facility] 11/03/2012, 9:39 AM  LOS: 2 days

## 2012-11-04 DIAGNOSIS — I498 Other specified cardiac arrhythmias: Secondary | ICD-10-CM

## 2012-11-04 DIAGNOSIS — I214 Non-ST elevation (NSTEMI) myocardial infarction: Secondary | ICD-10-CM

## 2012-11-04 LAB — BASIC METABOLIC PANEL
BUN: 78 mg/dL — ABNORMAL HIGH (ref 6–23)
Calcium: 8.3 mg/dL — ABNORMAL LOW (ref 8.4–10.5)
Creatinine, Ser: 2.87 mg/dL — ABNORMAL HIGH (ref 0.50–1.10)
GFR calc Af Amer: 18 mL/min — ABNORMAL LOW (ref >90)
GFR calc non Af Amer: 15 mL/min — ABNORMAL LOW (ref >90)
Glucose, Bld: 140 mg/dL — ABNORMAL HIGH (ref 70–99)
Potassium: 4.2 mEq/L (ref 3.5–5.1)

## 2012-11-04 LAB — GLUCOSE, CAPILLARY
Glucose-Capillary: 135 mg/dL — ABNORMAL HIGH (ref 70–99)
Glucose-Capillary: 178 mg/dL — ABNORMAL HIGH (ref 70–99)
Glucose-Capillary: 178 mg/dL — ABNORMAL HIGH (ref 70–99)

## 2012-11-04 MED ORDER — SODIUM BICARBONATE 650 MG PO TABS
650.0000 mg | ORAL_TABLET | Freq: Every day | ORAL | Status: DC
  Administered 2012-11-04: 650 mg via ORAL
  Filled 2012-11-04: qty 1

## 2012-11-04 MED ORDER — GLIPIZIDE 5 MG PO TABS
2.5000 mg | ORAL_TABLET | Freq: Every day | ORAL | Status: DC
  Administered 2012-11-04 – 2012-11-05 (×2): 2.5 mg via ORAL
  Filled 2012-11-04 (×2): qty 1

## 2012-11-04 MED ORDER — TORSEMIDE 20 MG PO TABS
40.0000 mg | ORAL_TABLET | Freq: Every day | ORAL | Status: DC
  Administered 2012-11-04 – 2012-11-05 (×2): 40 mg via ORAL
  Filled 2012-11-04 (×2): qty 2

## 2012-11-04 NOTE — Progress Notes (Signed)
[facility] PROGRESS NOTE  [name]:[id number] DOB: April 19, 1941 DOA: 11/01/2012 PCP: Josue Hector, MD  Assessment/Plan: Acute on chronic renal failure. Possibly related to ACE inhibitor. Patient was started on IV hydration and IV Lasix. Her lisinopril has been discontinued. Appreciate nephrology assistance. Lasix discontinued and demadex started. IV fluids discontinued and sodium bicarb changed to po. She has good urine output and creatinine continues to improve.   Hyperkalemia. resolved with Kayexalate.   High anion gap metabolic acidosis. Likely related to renal failure. Lactic acid was normal on admissin. The patient was started on bicarbonate infusion. This is now resolved. She has been continued on po bicarbonate.   Bradycardia, EKG shows sinus bradycardia. Resolved.  Patient was on beta blocker prior to admission. Continue to hold.  Cardiac enzymes have been negative thus far. TSH is normal.   Type 2 diabetes. Patient was noted to be significantly hypoglycemic on admission. She is on oral hypoglycemic agents which effects have likely been prolonged due to worsening renal failure.  CBG 130-174. Continue SSI. Will also start glypizide low dose and monitor. Plan not to resume home amaryl given #1.   Anemia, likely due to chronic renal disease. Anemia panel has indicated and element of iron deficiency. She has been started on oral iron. Hemoglobin is stable.   Generalized weakness. Likely multifactorial due to chronic debility, anemia, renal failure, morbid obesity. Await physical therapy consult. Somewhat improved.    Tremors, possibly myoclonic jerks due to uremia. Much improved since admission Will continue to follow  Code Status: DNR Family Communication: none available Disposition Plan: home when ready   Consultants:  nephrology  Procedures:  none  Antibiotics:  none  HPI/Subjective: Awake alert denies pain/discomfort. NAD  Objective: Filed  Vitals:   11/03/12 1100 11/03/12 1200 11/03/12 1314 11/04/12 0345  BP: 143/96  152/60 152/96  Pulse:   66 71  Temp:  97.7 F (36.5 C) 97.5 F (36.4 C) 97.6 F (36.4 C)  TempSrc:  Oral  Oral  Resp: 12  14 17   Height:      Weight:    111 kg (244 lb 11.4 oz)  SpO2:   100% 99%    Intake/Output Summary (Last 24 hours) at 11/04/12 1137 Last data filed at 11/04/12 1057  Gross per 24 hour  Intake 1452.5 ml  Output   4050 ml  Net -2597.5 ml   Filed Weights   11/02/12 0500 11/03/12 0500 11/04/12 0345  Weight: 118.3 kg (260 lb 12.9 oz) 116.9 kg (257 lb 11.5 oz) 111 kg (244 lb 11.4 oz)    Exam:   General:  Obese, NAD  Cardiovascular: RRR Trace LE edema   Respiratory: normal effort BS distant but clear no wheeze no crackles  Abdomen: obese soft +BS non-tender to palpation.   Musculoskeletal: no clubbing no cyanosis.    Data Reviewed: Basic Metabolic Panel:  Recent Labs Lab 11/01/12 1052 11/02/12 0355 11/03/12 0518 11/04/12 0523  NA 141 138 141 141  K 5.9* 5.1 4.3 4.2  CL 111 107 104 100  CO2 13* 13* 26 29  GLUCOSE 98 38* 80 140*  BUN 103* 100* 91* 78*  CREATININE 3.55* 3.52* 3.16* 2.87*  CALCIUM 8.2* 7.8* 7.6* 8.3*  PHOS  --  7.4* 6.2*  --    Liver Function Tests:  Recent Labs Lab 11/01/12 1052  AST 33  ALT 56*  ALKPHOS 63  BILITOT 0.1*  PROT 6.5  ALBUMIN 2.8*    Recent Labs Lab 11/01/12 1052  LIPASE 130*   No results found for this basename: AMMONIA,  in the last 168 hours CBC:  Recent Labs Lab 11/01/12 1052 11/02/12 0355 11/03/12 0518  WBC 7.6 7.8 7.5  NEUTROABS 4.0  --   --   HGB 8.6* 8.7* 9.3*  HCT 26.7* 27.0* 28.7*  MCV 89.3 88.2 87.0  PLT 133* 135* 155   Cardiac Enzymes:  Recent Labs Lab 11/01/12 1052 11/01/12 1600 11/01/12 2144 11/02/12 0355  TROPONINI <0.30 <0.30 <0.30 <0.30   BNP (last 3 results)  Recent Labs  06/24/12 1740 10/06/12 2057  PROBNP 4638.0* 6749.0*   CBG:  Recent Labs Lab 11/03/12 1626  11/03/12 2002 11/04/12 0100 11/04/12 0343 11/04/12 0723  GLUCAP 147* 168* 174* 135* 130*    Recent Results (from the past 240 hour(s))  MRSA PCR SCREENING     Status: None   Collection Time    11/01/12  7:05 PM      Result Value Range Status   MRSA by PCR NEGATIVE  NEGATIVE Final   Comment:            The GeneXpert MRSA Assay (FDA     approved for NASAL specimens     only), is one component of a     comprehensive MRSA colonization     surveillance program. It is not     intended to diagnose MRSA     infection nor to guide or     monitor treatment for     MRSA infections.     Studies: No results found.  Scheduled Meds: . aspirin EC  325 mg Oral Daily  . calcium acetate  1,334 mg Oral TID WC  . fluocinonide gel   Topical BID  . heparin  5,000 Units Subcutaneous Q8H  . insulin aspart  0-15 Units Subcutaneous TID WC  . iron polysaccharides  150 mg Oral BID  . pantoprazole  40 mg Oral Daily  . sodium bicarbonate  650 mg Oral Daily  . torsemide  40 mg Oral Daily   Continuous Infusions:   Principal Problem:   Acute on chronic renal failure Active Problems:   Anemia   Dyslipidemia   CAD (coronary artery disease)   Morbid obesity   Hyperkalemia   High anion gap metabolic acidosis   DM type 2, uncontrolled, with neuropathy   Bradycardia   Chest pain   Elevated lipase   Elevated ALT measurement    Time spent: 40 minutes    [facility] M  [facility] Pager [phone]. If 7PM-7AM, please contact night-coverage at [email or web address], password [facility] 11/04/2012, 11:37 AM  LOS: 3 days   Attending: Patient seen and examined. The above note reviewed. Patient is stabilizing. Hopefully, she should be able to be discharged home tomorrow. Start low-dose glipizide in place of Amaryl which is longer acting, especially in view of the chronic kidney disease.

## 2012-11-04 NOTE — Evaluation (Signed)
Physical Therapy Evaluation Patient Details Name: [name] MRN: [id number] DOB: Jun 05, 1941 Today's Date: 11/04/2012 Time: 4098-1191 PT Time Calculation (min): 17 min  PT Assessment / Plan / Recommendation Clinical Impression  Pt was seen for evaluation and found to be at prior functional level.  Her gait with a walker is stable and she should be able to transition to home at d/c.    PT Assessment  Patent does not need any further PT services    Follow Up Recommendations  No PT follow up    Does the patient have the potential to tolerate intense rehabilitation      Barriers to Discharge        Equipment Recommendations  None recommended by PT    Recommendations for Other Services     Frequency      Precautions / Restrictions Precautions Precautions: None Restrictions Weight Bearing Restrictions: No   Pertinent Vitals/Pain       Mobility  Bed Mobility Bed Mobility: Not assessed Transfers Transfers: Sit to Stand;Stand to Sit Sit to Stand: 6: Modified independent (Device/Increase time);From bed;With upper extremity assist Stand to Sit: 6: Modified independent (Device/Increase time);To bed;With upper extremity assist Ambulation/Gait Ambulation/Gait Assistance: 6: Modified independent (Device/Increase time) Ambulation Distance (Feet): 250 Feet Assistive device: Rolling walker Gait Pattern: Within Functional Limits;Trunk flexed Stairs: No Wheelchair Mobility Wheelchair Mobility: No    Exercises     PT Diagnosis:    PT Problem List:   PT Treatment Interventions:     PT Goals    Visit Information  Last PT Received On: 11/04/12    Subjective Data  Subjective: I feel a lot better Patient Stated Goal: retrun home   Prior Functioning  Home Living Lives With: Son Available Help at Discharge: Family;Available PRN/intermittently Type of Home: House Home Access: Stairs to enter [employer] of Steps: 2 Entrance Stairs-Rails: Right;Left;Can reach  both Home Layout: One level Firefighter: Standard Home Adaptive Equipment: Walker - rolling;Wheelchair - manual;Bedside commode/3-in-1;Shower chair with back Prior Function Level of Independence: Independent with assistive device(s) Able to Take Stairs?: Yes Driving: No Vocation: 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, advertising Arousal/Alertness: Awake/alert Behavior During Therapy: WFL for tasks assessed/performed Overall Cognitive Status: Within Functional Limits for tasks assessed    Extremity/Trunk Assessment Right Lower Extremity Assessment RLE ROM/Strength/Tone: WFL for tasks assessed RLE Sensation: WFL - Light Touch RLE Coordination: WFL - gross motor Left Lower Extremity Assessment LLE ROM/Strength/Tone: WFL for tasks assessed LLE Sensation: WFL - Light Touch LLE Coordination: WFL - gross motor Trunk Assessment Trunk Assessment: Kyphotic   Balance Balance Balance Assessed: No  End of Session PT - End of Session Equipment Utilized During Treatment: Gait belt Activity Tolerance: Patient tolerated treatment well Patient left: in bed;with call bell/phone within reach  GP     Myrlene Broker L 11/04/2012, 3:12 PM

## 2012-11-04 NOTE — Progress Notes (Signed)
Subjective: Interval History: has complaints weakness but improving. She denies any nausea or vomiting. Patient also denies any difficulty breathing..  Objective: Vital signs in last 24 hours: Temp:  [97.5 F (36.4 C)-97.8 F (36.6 C)] 97.6 F (36.4 C) (06/02 0345) Pulse Rate:  [65-71] 71 (06/02 0345) Resp:  [12-17] 17 (06/02 0345) BP: (143-152)/(60-96) 152/96 mmHg (06/02 0345) SpO2:  [95 %-100 %] 99 % (06/02 0345) Weight:  [111 kg (244 lb 11.4 oz)] 111 kg (244 lb 11.4 oz) (06/02 0345) Weight change: -5.9 kg (-13 lb 0.1 oz)  Intake/Output from previous day: 06/01 0701 - 06/02 0700 In: 2642.5 [P.O.:920; I.V.:1672.5; IV Piggyback:50] Out: 4750 [Urine:4750] Intake/Output this shift:    General appearance: alert, cooperative and no distress Resp: diminished breath sounds posterior - bilateral and dullness to percussion bilaterally Cardio: regular rate and rhythm, S1, S2 normal, no murmur, click, rub or gallop GI: soft, non-tender; bowel sounds normal; no masses,  no organomegaly Extremities: edema 1-2+ edema  Lab Results:  Recent Labs  11/02/12 0355 11/03/12 0518  WBC 7.8 7.5  HGB 8.7* 9.3*  HCT 27.0* 28.7*  PLT 135* 155   BMET:   Recent Labs  11/03/12 0518 11/04/12 0523  NA 141 141  K 4.3 4.2  CL 104 100  CO2 26 29  GLUCOSE 80 140*  BUN 91* 78*  CREATININE 3.16* 2.87*  CALCIUM 7.6* 8.3*   No results found for this basename: PTH,  in the last 72 hours Iron Studies:   Recent Labs  11/02/12 1055  IRON 40*  TIBC 274  FERRITIN 74    Studies/Results: No results found.  I have reviewed the patient's current medications.  Assessment/Plan: Problem #1 renal failure most likely acute on chronic BUN is 78 and creatinine 2.87 renal function seems to be improving. She has underlying chronic renal failure. Presently seems to be returning to her baseline. Problem #2 metabolic acidosis she is on sodium bicarbonate CO2 is 29 corrected.. Problem #3 anemia her  hemoglobin and hematocrit is low. Patient presently on iron supplement. Problem #4 hyperkalemia potassium has improved Problem #5 type 2 diabetes Problem #6 bradycardia Problem #7 coronary artery disease today she doesn't have any chest pain. Problem #8 metabolic bone disease phosphorus presently high but improving We'll check her basic metabolic panel in the morning. We'll DC IV fluid with sodium bicarbonate. Change sodium bicarbonate to 650 once a day We'll continue with PhosLo. DC IV Lasix DC IV fluid to Demadex 40 mg once a day. .    LOS: 3 days   Elvi Leventhal S 11/04/2012,7:23 AM

## 2012-11-05 LAB — BASIC METABOLIC PANEL
BUN: 68 mg/dL — ABNORMAL HIGH (ref 6–23)
Calcium: 8.8 mg/dL (ref 8.4–10.5)
GFR calc Af Amer: 21 mL/min — ABNORMAL LOW (ref >90)
GFR calc non Af Amer: 18 mL/min — ABNORMAL LOW (ref >90)
Glucose, Bld: 186 mg/dL — ABNORMAL HIGH (ref 70–99)
Potassium: 4.3 mEq/L (ref 3.5–5.1)

## 2012-11-05 LAB — GLUCOSE, CAPILLARY
Glucose-Capillary: 150 mg/dL — ABNORMAL HIGH (ref 70–99)
Glucose-Capillary: 211 mg/dL — ABNORMAL HIGH (ref 70–99)

## 2012-11-05 MED ORDER — TORSEMIDE 20 MG PO TABS: 40.0000 mg | ORAL_TABLET | Freq: Every day | ORAL | Status: DC

## 2012-11-05 MED ORDER — GLIPIZIDE 2.5 MG HALF TABLET: 2.5000 mg | ORAL_TABLET | Freq: Every day | ORAL | Status: DC

## 2012-11-05 NOTE — Discharge Summary (Signed)
Physician Discharge Summary  BRINDLE LEYBA ZOX:[id number] DOB: 17-Jun-1940 DOA: 11/01/2012  PCP: Josue Hector, MD  Admit date: 11/01/2012 Discharge date: 11/05/2012  Time spent: 40 minutes  Recommendations for Outpatient Follow-up:  1. Follow up with PCP 1 week for evaluation of symptoms, renal function, BP control and DM control 2. Follow up in 4 weeks with Dr Kristian Covey  Discharge Diagnoses:  Principal Problem:   Acute on chronic renal failure Active Problems:   Anemia   Dyslipidemia   CAD (coronary artery disease)   Morbid obesity   Hyperkalemia   High anion gap metabolic acidosis   DM type 2, uncontrolled, with neuropathy   Bradycardia   Chest pain   Elevated lipase   Elevated ALT measurement   Discharge Condition: stable  Diet recommendation: carb modified  Filed Weights   11/03/12 0500 11/04/12 0345 11/05/12 0438  Weight: 116.9 kg (257 lb 11.5 oz) 111 kg (244 lb 11.4 oz) 108.1 kg (238 lb 5.1 oz)    History of present illness:  [name] is a 72 y.o. female with past medical hx of CAD, DM, CKD IV, anemia, hyperlipidemia, HTN, fibromyalgia who presented to ED on 11/01/12 with cc abnormal labs. Information obtained from pt and family. For previous several days pt had experienced morning tremors of upper and lower extremities to the point that "i cant do nothing". The tremors seemed to subside once she ate and took her morning lyrica. The morning prior to admission EMS was called and glucose found to be 60. She was treated and went to her PCP later in the day. Labs were drawn and some changes in diabetic meds made but not been implemented. The morning of 11/01/12 her glucose was 201 and she still had tremors that subsided when she ate and took lyrica. The PCP office called and instructed her to go to ED for high creatinine. During this time period she also experienced intermittent chest pain. Stated the pain was dull, non-radiating on the right anterior chest. She said she  took NTG and the pain was relieved. She denied worsening sob, diaphoresis, palpitations, headache, nausea/vomiting. Her appetite had been poor so reported decrease of solid food but taking fluids well. Denied abdominal pain, constipation, diarrhea. Denied fever, chills sick contacts. In ED creatinine 3.5 m Hg 8.6, potassium 5.9. Chest xray with no active disease. Vital signs significant for HR 48-52.  Hospital Course:  Acute on chronic renal failure: Likely related to ACE inhibitor. No significant diarrhea or vomiting. No source infection. No evidence renal obstruction on CT. Urinalysis unremarkable.Gently hydrated with IV fluids. Urine output adequate. Hx CKD IV. Followed by nephrology who opined acute kidney injury superimposed on chronic possibly a compression of renal syndrome and ACE inhibitor. IV lasix started and pt tolerated well. At discharge creatinine trending toward baseline. Lasix and lisinopril discontinued and demadex started. Will follow up with nephrology in 4 weeks.   Active Problems:   Hyperkalemia: related to #1. Given kayexelate. Resolved at discharge  High anion gap metabolic acidosis: related to #1. Gap 17. Lactic acid normal. Pt. Provided with bicarbonate infusion and then po. At discharge po bicarbonate discontinued as acidosis resolved.    Bradycardia: hx of same. BB and imdur held initially. Troponins negative  and repeat EKG without changes. TSH within the limits of normal. At discharge HR range 65-75. Will resume home medications. Recommend close follow up with PCP  Chest pain: atypical: Pt pain free in ED. See above   Elevated lipase: denies abdominal  pain or nausea. Exam benign. CT abdomen yields no pancreatic inflammation or evidence of renal obstruction.   DM type 2, uncontrolled, with neuropathy: HgA1c 7.5. Noted to be significantly hypoglycemic in field. Oral meds held initially and SSI used. Amaryl changed to glipizide and CBG range 150-200. Will continue at  discharge. Recommend close follow up with PCP for optimal glycemic control  Anemia: Stable at baseline.  Heme neg. No s/sx active bleeding. Likely related to chronic disease.    CAD (coronary artery disease): see #5.  Generalized weakness: likely multi factorial due to chronic debility, anemia, renal failure, morbid obesity. PT evaluation found to be at prior functional level. No HH needs   Procedures:  none  Consultations:  Nephrology  Discharge Exam: Filed Vitals:   11/04/12 0345 11/04/12 1418 11/04/12 2155 11/05/12 0438  BP: 152/96  172/74 152/53  Pulse: 71 62 75 68  Temp: 97.6 F (36.4 C) 97.8 F (36.6 C) 98 F (36.7 C) 97.9 F (36.6 C)  TempSrc: Oral Oral Oral Oral  Resp: 17 18 16 18   Height:      Weight: 111 kg (244 lb 11.4 oz)   108.1 kg (238 lb 5.1 oz)  SpO2: 99% 96% 95% 96%    General: obese, sitting on side of bed NAD Cardiovascular: RRR No MGR No LE edema Respiratory: normal effort BS clear bilaterally no wheeze no rhonchi Abdomen: soft +BS non-tender to palpation  Discharge Instructions     Medication List    STOP taking these medications       furosemide 20 MG tablet  Commonly known as:  LASIX     glimepiride 4 MG tablet  Commonly known as:  AMARYL     lisinopril 20 MG tablet  Commonly known as:  PRINIVIL,ZESTRIL      TAKE these medications       carvedilol 6.25 MG tablet  Commonly known as:  COREG  Take 6.25 mg by mouth 2 (two) times daily with a meal.     cetirizine 10 MG tablet  Commonly known as:  ZYRTEC  Take 10 mg by mouth 2 (two) times daily.     Cyanocobalamin 500 MCG Subl  Place 1 tablet under the tongue 2 (two) times daily.     docusate sodium 100 MG capsule  Commonly known as:  COLACE  Take 100 mg by mouth 2 (two) times daily.     doxycycline 100 MG capsule  Commonly known as:  MONODOX  Take 100 mg by mouth daily.     Ferrous Sulfate 27 MG Tabs  Take 1 tablet by mouth 2 (two) times daily.     fluocinonide cream  0.05 %  Commonly known as:  LIDEX  Apply 1 application topically 2 (two) times daily as needed. Sore on leg     glipiZIDE 2.5 mg Tabs  Commonly known as:  GLUCOTROL  Take 0.5 tablets (2.5 mg total) by mouth daily before breakfast.     isosorbide mononitrate 30 MG 24 hr tablet  Commonly known as:  IMDUR  Take 30 mg by mouth daily.     nitroGLYCERIN 0.4 MG SL tablet  Commonly known as:  NITROSTAT  Place 1 mg under the tongue every 5 (five) minutes x 3 doses as needed for chest pain. For chest pain     pantoprazole 40 MG tablet  Commonly known as:  PROTONIX  Take 40 mg by mouth daily.     pregabalin 50 MG capsule  Commonly known as:  LYRICA  Take 50 mg by mouth 2 (two) times daily.     rosuvastatin 20 MG tablet  Commonly known as:  CRESTOR  Take 20 mg by mouth at bedtime.     torsemide 20 MG tablet  Commonly known as:  DEMADEX  Take 2 tablets (40 mg total) by mouth daily.       Allergies  Allergen Reactions  . Codeine Other (See Comments)    Childhood allergy       Follow-up Information   Follow up with [facility] S, MD In 4 weeks.   Contact information:   [address] [place] [address] [phone]       Follow up with Josue Hector, MD. Schedule an appointment as soon as possible for a visit in 1 week. (follow bmet to trend kidney function and recommend evaluation of diabetes control as oral medications changed )    Contact information:   [address] [facility] [address] [phone]        The results of significant diagnostics from this hospitalization (including imaging, microbiology, ancillary and laboratory) are listed below for reference.    Significant Diagnostic Studies: Ct Abdomen Pelvis Wo Contrast  11/01/2012   *RADIOLOGY REPORT*  Clinical Data: Elevated lipase and renal failure.  CT ABDOMEN AND PELVIS WITHOUT CONTRAST  Technique:  Multidetector CT imaging of the abdomen and pelvis was performed following the  standard protocol without intravenous contrast.  Comparison: 03/12/2006  Findings: Unenhanced CT shows no overt inflammatory changes or abnormal fluid collection surrounding the pancreas.  The liver shows diffuse fatty infiltration.  No biliary ductal dilatation is identified.  The gallbladder is contracted.  The kidneys show no evidence of obstruction or calculi.  The spleen is unremarkable.  No adrenal masses are identified.  Bowel shows normal caliber without obstruction or inflammation.  No abnormal fluid collections are identified.  No hernias are seen.  The bladder is unremarkable.  No masses or enlarged lymph nodes are seen.  IMPRESSION: No pancreatic inflammation by unenhanced CT.  No evidence of renal obstruction.   Original Report Authenticated By: Irish Lack, M.D.   Dg Chest 2 View  11/01/2012   *RADIOLOGY REPORT*  Clinical Data: Fatigue.  CHEST - 2 VIEW  Comparison: 10/06/2012  Findings: Heart is mildly enlarged.  No edema, consolidation or pleural fluid is identified.  The bony thorax is unremarkable.  IMPRESSION: No active disease.   Original Report Authenticated By: Irish Lack, M.D.   Dg Chest Portable 1 View  10/06/2012   *RADIOLOGY REPORT*  Clinical Data: Chest pain  PORTABLE CHEST - 1 VIEW  Comparison: 06/24/2012  Findings: Borderline cardiomegaly.  No acute infiltrate or pulmonary edema.  Bony thorax is stable.  IMPRESSION: Borderline cardiomegaly.  No active disease.   Original Report Authenticated By: Natasha Mead, M.D.    Microbiology: Recent Results (from the past 240 hour(s))  MRSA PCR SCREENING     Status: None   Collection Time    11/01/12  7:05 PM      Result Value Range Status   MRSA by PCR NEGATIVE  NEGATIVE Final   Comment:            The GeneXpert MRSA Assay (FDA     approved for NASAL specimens     only), is one component of a     comprehensive MRSA colonization     surveillance program. It is not     intended to diagnose MRSA     infection nor to guide or  monitor treatment for     MRSA infections.     Labs: Basic Metabolic Panel:  Recent Labs Lab 11/01/12 1052 11/02/12 0355 11/03/12 0518 11/04/12 0523 11/05/12 0540  NA 141 138 141 141 141  K 5.9* 5.1 4.3 4.2 4.3  CL 111 107 104 100 101  CO2 13* 13* 26 29 30   GLUCOSE 98 38* 80 140* 186*  BUN 103* 100* 91* 78* 68*  CREATININE 3.55* 3.52* 3.16* 2.87* 2.54*  CALCIUM 8.2* 7.8* 7.6* 8.3* 8.8  PHOS  --  7.4* 6.2*  --  3.6   Liver Function Tests:  Recent Labs Lab 11/01/12 1052  AST 33  ALT 56*  ALKPHOS 63  BILITOT 0.1*  PROT 6.5  ALBUMIN 2.8*    Recent Labs Lab 11/01/12 1052  LIPASE 130*   No results found for this basename: AMMONIA,  in the last 168 hours CBC:  Recent Labs Lab 11/01/12 1052 11/02/12 0355 11/03/12 0518  WBC 7.6 7.8 7.5  NEUTROABS 4.0  --   --   HGB 8.6* 8.7* 9.3*  HCT 26.7* 27.0* 28.7*  MCV 89.3 88.2 87.0  PLT 133* 135* 155   Cardiac Enzymes:  Recent Labs Lab 11/01/12 1052 11/01/12 1600 11/01/12 2144 11/02/12 0355  TROPONINI <0.30 <0.30 <0.30 <0.30   BNP: BNP (last 3 results)  Recent Labs  06/24/12 1740 10/06/12 2057  PROBNP 4638.0* 6749.0*   CBG:  Recent Labs Lab 11/04/12 1722 11/04/12 1945 11/05/12 0001 11/05/12 0424 11/05/12 0732  GLUCAP 175* 178* 155* 150* 211*       Signed:  Toya Smothers M  [facility] 11/05/2012, 9:39 AM Attending: Patient seen and examined. Patient is medically stable for discharge now. Agree with above.

## 2012-11-05 NOTE — Progress Notes (Signed)
Patient with orders to be discharge home. Discharge instructions given, patient verbalized understanding. Prescriptions given. Patient in stable condition upon discharge. Patient left with granddaughter in private vehicle.

## 2012-11-05 NOTE — Care Management Note (Signed)
    Page 1 of 1   11/05/2012     11:24:42 AM   CARE MANAGEMENT NOTE 11/05/2012  Patient:  [name], [name]   Account Number:  [id number]  Date Initiated:  11/05/2012  Documentation initiated by:  Rosemary Holms  Subjective/Objective Assessment:   Pt admitted from home where she lives with family. Pt walks with walker and has had multiple falls. Requesting wheel chair.     Action/Plan:   Anticipated DC Date:  11/05/2012   Anticipated DC Plan:  HOME/SELF CARE      DC Planning Services  CM consult      Choice offered to / List presented to:     DME arranged  WHEELCHAIR - MANUAL      DME agency  [facility]        Status of service:  Completed, signed off Medicare Important Message given?  YES (If response is "NO", the following Medicare IM given date fields will be blank) Date Medicare IM given:  11/05/2012 Date Additional Medicare IM given:    Discharge Disposition:  HOME/SELF CARE  Per UR Regulation:    If discussed at Long Length of Stay Meetings, dates discussed:    Comments:  11/05/12 Rosemary Holms RN BSN CM Pt 5'3" height, wt 238 lbs. Pt states WC can be delivered to her home.

## 2012-11-05 NOTE — Progress Notes (Signed)
Subjective: Interval History: has complaints weakness but improving. She denies any nausea or vomiting. Patient also denies any difficulty breathing..  Objective: Vital signs in last 24 hours: Temp:  [97.8 F (36.6 C)-98 F (36.7 C)] 97.9 F (36.6 C) (06/03 0438) Pulse Rate:  [62-75] 68 (06/03 0438) Resp:  [16-18] 18 (06/03 0438) BP: (152-172)/(53-74) 152/53 mmHg (06/03 0438) SpO2:  [95 %-96 %] 96 % (06/03 0438) Weight:  [108.1 kg (238 lb 5.1 oz)] 108.1 kg (238 lb 5.1 oz) (06/03 0438) Weight change: -2.9 kg (-6 lb 6.3 oz)  Intake/Output from previous day: 06/02 0701 - 06/03 0700 In: 610 [P.O.:610] Out: 1700 [Urine:1700] Intake/Output this shift:    General appearance: alert, cooperative and no distress Resp: diminished breath sounds posterior - bilateral and dullness to percussion bilaterally Cardio: regular rate and rhythm, S1, S2 normal, no murmur, click, rub or gallop GI: soft, non-tender; bowel sounds normal; no masses,  no organomegaly Extremities: edema 1-2+ edema  Lab Results:  Recent Labs  11/03/12 0518  WBC 7.5  HGB 9.3*  HCT 28.7*  PLT 155   BMET:   Recent Labs  11/04/12 0523 11/05/12 0540  NA 141 141  K 4.2 4.3  CL 100 101  CO2 29 30  GLUCOSE 140* 186*  BUN 78* 68*  CREATININE 2.87* 2.54*  CALCIUM 8.3* 8.8   No results found for this basename: PTH,  in the last 72 hours Iron Studies:   Recent Labs  11/02/12 1055  IRON 40*  TIBC 274  FERRITIN 74    Studies/Results: No results found.  I have reviewed the patient's current medications.  Assessment/Plan: Problem #1 renal failure most likely acute on chronic BUN is 68  and creatinine 2.54 renal function seems to be improving. She has underlying chronic renal failure. Presently seems to be returning to her baseline. Problem #2 metabolic acidosis she is on sodium bicarbonate CO2 is 29 corrected.. Problem #3 anemia her hemoglobin and hematocrit is low. Patient presently on iron  supplement. Problem #4 hyperkalemia potassium has corected Problem #5 type 2 diabetes Problem #6 bradycardia Problem #7 coronary artery disease today she doesn't have any chest pain. Problem #8 metabolic bone disease phosphorus presently high but improving We'll check her basic metabolic panel in the morning We'll DC sodium bicarbonate. Continue with Demadex 40 mg once a day. .    LOS: 4 days   Chioma Mukherjee S 11/05/2012,7:13 AM

## 2012-12-17 DIAGNOSIS — M25519 Pain in unspecified shoulder: Secondary | ICD-10-CM

## 2013-01-21 ENCOUNTER — Ambulatory Visit (HOSPITAL_COMMUNITY)
Admission: RE | Admit: 2013-01-21 | Discharge: 2013-01-21 | Disposition: A | Discharge status: Home / Self Care | Payer: Medicare Other | Source: Ambulatory Visit | Attending: Internal Medicine | Admitting: Internal Medicine

## 2013-01-21 ENCOUNTER — Other Ambulatory Visit: Payer: Self-pay | Admitting: Internal Medicine

## 2013-01-21 DIAGNOSIS — R52 Pain, unspecified: Secondary | ICD-10-CM

## 2013-01-21 DIAGNOSIS — M7989 Other specified soft tissue disorders: Secondary | ICD-10-CM | POA: Insufficient documentation

## 2013-01-21 DIAGNOSIS — M79609 Pain in unspecified limb: Secondary | ICD-10-CM | POA: Insufficient documentation

## 2013-01-21 DIAGNOSIS — R609 Edema, unspecified: Secondary | ICD-10-CM

## 2013-04-09 ENCOUNTER — Encounter (HOSPITAL_COMMUNITY): Payer: Self-pay | Admitting: Emergency Medicine

## 2013-04-09 ENCOUNTER — Emergency Department (HOSPITAL_COMMUNITY)
Admission: EM | Admit: 2013-04-09 | Discharge: 2013-04-09 | Disposition: A | Discharge status: Home / Self Care | Payer: Medicare Other | Attending: Emergency Medicine | Admitting: Emergency Medicine

## 2013-04-09 DIAGNOSIS — E119 Type 2 diabetes mellitus without complications: Secondary | ICD-10-CM | POA: Insufficient documentation

## 2013-04-09 DIAGNOSIS — E785 Hyperlipidemia, unspecified: Secondary | ICD-10-CM | POA: Insufficient documentation

## 2013-04-09 DIAGNOSIS — Z79899 Other long term (current) drug therapy: Secondary | ICD-10-CM | POA: Insufficient documentation

## 2013-04-09 DIAGNOSIS — I4891 Unspecified atrial fibrillation: Secondary | ICD-10-CM | POA: Insufficient documentation

## 2013-04-09 DIAGNOSIS — IMO0001 Reserved for inherently not codable concepts without codable children: Secondary | ICD-10-CM | POA: Insufficient documentation

## 2013-04-09 DIAGNOSIS — Z8719 Personal history of other diseases of the digestive system: Secondary | ICD-10-CM | POA: Insufficient documentation

## 2013-04-09 DIAGNOSIS — Z792 Long term (current) use of antibiotics: Secondary | ICD-10-CM | POA: Insufficient documentation

## 2013-04-09 DIAGNOSIS — I10 Essential (primary) hypertension: Secondary | ICD-10-CM | POA: Insufficient documentation

## 2013-04-09 DIAGNOSIS — I252 Old myocardial infarction: Secondary | ICD-10-CM | POA: Insufficient documentation

## 2013-04-09 DIAGNOSIS — D649 Anemia, unspecified: Secondary | ICD-10-CM | POA: Insufficient documentation

## 2013-04-09 DIAGNOSIS — G2581 Restless legs syndrome: Secondary | ICD-10-CM | POA: Insufficient documentation

## 2013-04-09 DIAGNOSIS — F411 Generalized anxiety disorder: Secondary | ICD-10-CM | POA: Insufficient documentation

## 2013-04-09 DIAGNOSIS — Z87448 Personal history of other diseases of urinary system: Secondary | ICD-10-CM | POA: Insufficient documentation

## 2013-04-09 DIAGNOSIS — E669 Obesity, unspecified: Secondary | ICD-10-CM | POA: Insufficient documentation

## 2013-04-09 DIAGNOSIS — R739 Hyperglycemia, unspecified: Secondary | ICD-10-CM

## 2013-04-09 DIAGNOSIS — B029 Zoster without complications: Secondary | ICD-10-CM | POA: Insufficient documentation

## 2013-04-09 DIAGNOSIS — I2510 Atherosclerotic heart disease of native coronary artery without angina pectoris: Secondary | ICD-10-CM | POA: Insufficient documentation

## 2013-04-09 DIAGNOSIS — Z8701 Personal history of pneumonia (recurrent): Secondary | ICD-10-CM | POA: Insufficient documentation

## 2013-04-09 LAB — CBC WITH DIFFERENTIAL/PLATELET
HCT: 34.9 % — ABNORMAL LOW (ref 36.0–46.0)
Hemoglobin: 11.4 g/dL — ABNORMAL LOW (ref 12.0–15.0)
Lymphocytes Relative: 35 % (ref 12–46)
Lymphs Abs: 2.8 10*3/uL (ref 0.7–4.0)
Monocytes Relative: 6 % (ref 3–12)
Neutro Abs: 4.3 10*3/uL (ref 1.7–7.7)
Neutrophils Relative %: 54 % (ref 43–77)
RBC: 3.98 MIL/uL (ref 3.87–5.11)

## 2013-04-09 LAB — BASIC METABOLIC PANEL
BUN: 46 mg/dL — ABNORMAL HIGH (ref 6–23)
CO2: 25 mEq/L (ref 19–32)
Chloride: 99 mEq/L (ref 96–112)
GFR calc Af Amer: 28 mL/min — ABNORMAL LOW (ref >90)
Glucose, Bld: 466 mg/dL — ABNORMAL HIGH (ref 70–99)
Potassium: 3.7 mEq/L (ref 3.5–5.1)

## 2013-04-09 LAB — URINALYSIS, ROUTINE W REFLEX MICROSCOPIC
Bilirubin Urine: NEGATIVE
Glucose, UA: 500 mg/dL — AB
Nitrite: NEGATIVE
Specific Gravity, Urine: 1.015 (ref 1.005–1.030)
pH: 6 (ref 5.0–8.0)

## 2013-04-09 LAB — GLUCOSE, CAPILLARY
Glucose-Capillary: 440 mg/dL — ABNORMAL HIGH (ref 70–99)
Glucose-Capillary: 343 mg/dL — ABNORMAL HIGH (ref 70–99)

## 2013-04-09 LAB — PROTIME-INR
INR: 1.17 (ref 0.00–1.49)
Prothrombin Time: 14.7 seconds (ref 11.6–15.2)

## 2013-04-09 LAB — APTT: aPTT: 25 seconds (ref 24–37)

## 2013-04-09 MED ORDER — VALACYCLOVIR HCL 1 G PO TABS: 1000.0000 mg | ORAL_TABLET | Freq: Three times a day (TID) | ORAL | Status: AC

## 2013-04-09 MED ORDER — SODIUM CHLORIDE 0.9 % IV BOLUS (SEPSIS)
1000.0000 mL | Freq: Once | INTRAVENOUS | Status: AC
  Administered 2013-04-09: 1000 mL via INTRAVENOUS

## 2013-04-09 NOTE — ED Notes (Signed)
Pt c/o painful rash to r side of forehead and scalp since Thursday.  Reports Hyperglycemia for the past week.  Denies any n/v.

## 2013-04-09 NOTE — ED Notes (Signed)
Pt c/o itching and redness on forehead since Monday. Pt states rash-like area has since spread "all over my head". Pt denies any pain.

## 2013-04-09 NOTE — ED Notes (Signed)
Pt up to restroom. nad noted.  

## 2013-04-09 NOTE — ED Provider Notes (Signed)
CSN: [id number]     Arrival date & time 04/09/13  0902 History  This chart was scribed for Charles B. Bernette Mayers, MD by Quintella Reichert, ED scribe.  This patient was seen in room [facility]10/[facility]10 and the patient's care was started at 9:55 AM.   Chief Complaint  Patient presents with  . Hyperglycemia  . Rash    The history is provided by the patient. No language interpreter was used.    HPI Comments: [name] is a 72 y.o. female with h/o DM, HTN, hyperlipidemia, NSTEMI, anemia, CAD, and renal insufficiency who presents to the Emergency Department complaining of a gradual-onset, gradually-worsening facial rash and hyperglycemia.  Rash began 2 days ago and is localized to the right side of her forehead and scalp. Pt also complains of hyperglycemia for the past week up to 600.  She does not know why her sugar has been high.  She takes insulin and denies recent missed or changed dosages.  She has not been seen by her PCP for this.  Pt also notes that she has been unable to check her HTN because her machine was not working.  She also complains of dysuria described as a "burning" pain on urination.  PCP: Josue Hector, MD   Past Medical History  Diagnosis Date  . Diabetes mellitus   . Hypertension   . Hyperlipemia   . Fibromyalgia   . Obesity   . Sleep apnea     No CPAP  . Anxiety   . Hiatal hernia   . NSTEMI (non-ST elevated myocardial infarction) 04/27/2011  . Anemia 04/27/2011  . Restless leg syndrome   . CAD (coronary artery disease)     small dominant right coronary artery with diffuse 95% stenosis. The LAD had 70% stenosis in the mid vessel beyond the diagonal branch but was a small vessel. Circumflex had proximal 30% stenosis.   . Renal insufficiency 05/06/2011  . External hemorrhoids   . Arrhythmia 10/11/2011  . Community acquired pneumonia 06/24/2012    Past Surgical History  Procedure Laterality Date  . Appendectomy    . Fracture surgery      left humerous  . Ganglion  cyst removal      Family History  Problem Relation Age of Onset  . Stomach cancer Mother   . Heart disease Maternal Grandmother   . Heart disease Maternal Grandfather     History  Substance Use Topics  . Smoking status: Never Smoker   . Smokeless tobacco: Never Used  . Alcohol Use: No    OB History   Grav Para Term Preterm Abortions TAB SAB Ect Mult Living                  Review of Systems A complete 10 system review of systems was obtained and all systems are negative except as noted in the HPI and PMH.    Allergies  Codeine  Home Medications   Current Outpatient Rx  Name  Route  Sig  Dispense  Refill  . carvedilol (COREG) 6.25 MG tablet   Oral   Take 6.25 mg by mouth 2 (two) times daily with a meal.         . cetirizine (ZYRTEC) 10 MG tablet   Oral   Take 10 mg by mouth 2 (two) times daily.         . Cyanocobalamin 500 MCG SUBL   Sublingual   Place 1 tablet under the tongue 2 (two) times daily.         Marland Kitchen  docusate sodium (COLACE) 100 MG capsule   Oral   Take 100 mg by mouth 2 (two) times daily.          Marland Kitchen doxycycline (MONODOX) 100 MG capsule   Oral   Take 100 mg by mouth daily.          . Ferrous Sulfate 27 MG TABS   Oral   Take 1 tablet by mouth 2 (two) times daily.         . fluocinonide cream (LIDEX) 0.05 %   Topical   Apply 1 application topically 2 (two) times daily as needed. Sore on leg         . glipiZIDE (GLUCOTROL) 2.5 mg TABS   Oral   Take 0.5 tablets (2.5 mg total) by mouth daily before breakfast.   30 tablet   0   . isosorbide mononitrate (IMDUR) 30 MG 24 hr tablet   Oral   Take 30 mg by mouth daily.         . nitroGLYCERIN (NITROSTAT) 0.4 MG SL tablet   Sublingual   Place 1 mg under the tongue every 5 (five) minutes x 3 doses as needed for chest pain. For chest pain         . pantoprazole (PROTONIX) 40 MG tablet   Oral   Take 40 mg by mouth daily.         . pregabalin (LYRICA) 50 MG capsule   Oral    Take 50 mg by mouth 2 (two) times daily.          . rosuvastatin (CRESTOR) 20 MG tablet   Oral   Take 20 mg by mouth at bedtime.           . torsemide (DEMADEX) 20 MG tablet   Oral   Take 2 tablets (40 mg total) by mouth daily.   60 tablet   0    BP 130/113  Pulse 100  Temp(Src) 97.4 F (36.3 C) (Oral)  Resp 20  Ht 5\' 1"  (1.549 m)  Wt 238 lb (107.956 kg)  BMI 44.99 kg/m2  SpO2 99%  Physical Exam  Nursing note and vitals reviewed. Constitutional: She is oriented to person, place, and time. She appears well-developed and well-nourished.  HENT:  Head: Normocephalic and atraumatic.  Shingles rash to right forehead and scalp  Eyes: EOM are normal. Pupils are equal, round, and reactive to light.  Neck: Normal range of motion. Neck supple.  Cardiovascular: Normal rate, normal heart sounds and intact distal pulses.   Pulmonary/Chest: Effort normal and breath sounds normal.  Abdominal: Bowel sounds are normal. She exhibits no distension. There is no tenderness.  Musculoskeletal: Normal range of motion. She exhibits no edema and no tenderness.  Neurological: She is alert and oriented to person, place, and time. She has normal strength. No cranial nerve deficit or sensory deficit.  Skin: Skin is warm and dry. No rash noted.  Psychiatric: She has a normal mood and affect.    ED Course  Procedures (including critical care time)  DIAGNOSTIC STUDIES: Oxygen Saturation is 99% on room air, normal by my interpretation.    COORDINATION OF CARE: 10:01 AM-Discussed treatment plan which includes antiviral medication for shingles, and labs and IV fluids.  Pt expressed understanding and agreed with plan.     Labs Review Labs Reviewed  GLUCOSE, CAPILLARY - Abnormal; Notable for the following:    Glucose-Capillary 440 (*)    All other components within normal limits  CBC WITH DIFFERENTIAL -  Abnormal; Notable for the following:    Hemoglobin 11.4 (*)    HCT 34.9 (*)    Eosinophils  Relative 6 (*)    All other components within normal limits  BASIC METABOLIC PANEL - Abnormal; Notable for the following:    Glucose, Bld 466 (*)    BUN 46 (*)    Creatinine, Ser 1.98 (*)    GFR calc non Af Amer 24 (*)    GFR calc Af Amer 28 (*)    All other components within normal limits  URINALYSIS, ROUTINE W REFLEX MICROSCOPIC - Abnormal; Notable for the following:    Glucose, UA 500 (*)    Hgb urine dipstick TRACE (*)    Protein, ur 100 (*)    All other components within normal limits  TROPONIN I  PROTIME-INR  APTT  URINE MICROSCOPIC-ADD ON    Imaging Review No results found.  EKG Interpretation     Ventricular Rate:  94 PR Interval:    QRS Duration: 128 QT Interval:  396 QTC Calculation: 495 R Axis:   81 Text Interpretation:  Atrial fibrillation Right bundle branch block Abnormal ECG When compared with ECG of 02-Nov-2012 05:47, Atrial fibrillation has replaced Sinus rhythm Nonspecific T wave abnormality now evident in Inferior leads T wave inversion now evident in Anterior leads            MDM   1. Shingles   2. Hyperglycemia   3. Atrial fibrillation     Pt with history of afib. CBG improving with IVF. No signs of DKA or UTI. She has Shingles rash on forehead/scalp. Will start antivirals, advised close PCP followup for better glucose management.     I personally performed the services described in this documentation, which was scribed in my presence. The recorded information has been reviewed and is accurate.      Charles B. Bernette Mayers, MD 04/09/13 [phone]

## 2013-04-11 ENCOUNTER — Encounter (HOSPITAL_COMMUNITY): Payer: Self-pay | Admitting: Emergency Medicine

## 2013-04-11 ENCOUNTER — Emergency Department (HOSPITAL_COMMUNITY)
Admission: EM | Admit: 2013-04-11 | Discharge: 2013-04-12 | Disposition: A | Discharge status: Home / Self Care | Payer: Medicare Other | Attending: Emergency Medicine | Admitting: Emergency Medicine

## 2013-04-11 DIAGNOSIS — Z8659 Personal history of other mental and behavioral disorders: Secondary | ICD-10-CM | POA: Insufficient documentation

## 2013-04-11 DIAGNOSIS — E876 Hypokalemia: Secondary | ICD-10-CM | POA: Insufficient documentation

## 2013-04-11 DIAGNOSIS — I252 Old myocardial infarction: Secondary | ICD-10-CM | POA: Insufficient documentation

## 2013-04-11 DIAGNOSIS — E785 Hyperlipidemia, unspecified: Secondary | ICD-10-CM | POA: Insufficient documentation

## 2013-04-11 DIAGNOSIS — Z794 Long term (current) use of insulin: Secondary | ICD-10-CM | POA: Insufficient documentation

## 2013-04-11 DIAGNOSIS — Z8701 Personal history of pneumonia (recurrent): Secondary | ICD-10-CM | POA: Insufficient documentation

## 2013-04-11 DIAGNOSIS — R739 Hyperglycemia, unspecified: Secondary | ICD-10-CM

## 2013-04-11 DIAGNOSIS — I10 Essential (primary) hypertension: Secondary | ICD-10-CM | POA: Insufficient documentation

## 2013-04-11 DIAGNOSIS — Z79899 Other long term (current) drug therapy: Secondary | ICD-10-CM | POA: Insufficient documentation

## 2013-04-11 DIAGNOSIS — Z8673 Personal history of transient ischemic attack (TIA), and cerebral infarction without residual deficits: Secondary | ICD-10-CM | POA: Insufficient documentation

## 2013-04-11 DIAGNOSIS — E119 Type 2 diabetes mellitus without complications: Secondary | ICD-10-CM | POA: Insufficient documentation

## 2013-04-11 DIAGNOSIS — D649 Anemia, unspecified: Secondary | ICD-10-CM | POA: Insufficient documentation

## 2013-04-11 DIAGNOSIS — Z9861 Coronary angioplasty status: Secondary | ICD-10-CM | POA: Insufficient documentation

## 2013-04-11 DIAGNOSIS — N289 Disorder of kidney and ureter, unspecified: Secondary | ICD-10-CM | POA: Insufficient documentation

## 2013-04-11 DIAGNOSIS — G2581 Restless legs syndrome: Secondary | ICD-10-CM | POA: Insufficient documentation

## 2013-04-11 DIAGNOSIS — IMO0001 Reserved for inherently not codable concepts without codable children: Secondary | ICD-10-CM | POA: Insufficient documentation

## 2013-04-11 DIAGNOSIS — I2510 Atherosclerotic heart disease of native coronary artery without angina pectoris: Secondary | ICD-10-CM | POA: Insufficient documentation

## 2013-04-11 DIAGNOSIS — R21 Rash and other nonspecific skin eruption: Secondary | ICD-10-CM | POA: Insufficient documentation

## 2013-04-11 DIAGNOSIS — E860 Dehydration: Secondary | ICD-10-CM | POA: Insufficient documentation

## 2013-04-11 DIAGNOSIS — Z8619 Personal history of other infectious and parasitic diseases: Secondary | ICD-10-CM | POA: Insufficient documentation

## 2013-04-11 HISTORY — DX: Zoster without complications: B02.9

## 2013-04-11 LAB — BASIC METABOLIC PANEL
BUN: 45 mg/dL — ABNORMAL HIGH (ref 6–23)
CO2: 22 mEq/L (ref 19–32)
Chloride: 97 mEq/L (ref 96–112)
Creatinine, Ser: 2.15 mg/dL — ABNORMAL HIGH (ref 0.50–1.10)
GFR calc non Af Amer: 22 mL/min — ABNORMAL LOW (ref >90)
Glucose, Bld: 510 mg/dL — ABNORMAL HIGH (ref 70–99)
Sodium: 131 mEq/L — ABNORMAL LOW (ref 135–145)

## 2013-04-11 LAB — GLUCOSE, CAPILLARY
Glucose-Capillary: 211 mg/dL — ABNORMAL HIGH (ref 70–99)
Glucose-Capillary: 356 mg/dL — ABNORMAL HIGH (ref 70–99)
Glucose-Capillary: 466 mg/dL — ABNORMAL HIGH (ref 70–99)
Glucose-Capillary: 474 mg/dL — ABNORMAL HIGH (ref 70–99)

## 2013-04-11 LAB — URINALYSIS, ROUTINE W REFLEX MICROSCOPIC
Glucose, UA: 500 mg/dL — AB
Leukocytes, UA: NEGATIVE
Specific Gravity, Urine: 1.025 (ref 1.005–1.030)
pH: 5.5 (ref 5.0–8.0)

## 2013-04-11 LAB — CBC WITH DIFFERENTIAL/PLATELET
Basophils Absolute: 0 10*3/uL (ref 0.0–0.1)
HCT: 32.8 % — ABNORMAL LOW (ref 36.0–46.0)
Lymphocytes Relative: 37 % (ref 12–46)
Lymphs Abs: 2.5 10*3/uL (ref 0.7–4.0)
MCHC: 32.3 g/dL (ref 30.0–36.0)
Monocytes Absolute: 0.6 10*3/uL (ref 0.1–1.0)
Neutro Abs: 3.2 10*3/uL (ref 1.7–7.7)
Platelets: 145 10*3/uL — ABNORMAL LOW (ref 150–400)
RBC: 3.71 MIL/uL — ABNORMAL LOW (ref 3.87–5.11)
RDW: 14.3 % (ref 11.5–15.5)
WBC: 6.7 10*3/uL (ref 4.0–10.5)

## 2013-04-11 LAB — TROPONIN I: Troponin I: <0.3 ng/mL (ref <0.30)

## 2013-04-11 LAB — URINE MICROSCOPIC-ADD ON

## 2013-04-11 MED ORDER — SODIUM CHLORIDE 0.9 % IV SOLN
INTRAVENOUS | Status: DC
  Administered 2013-04-11: 4.1 [IU]/h via INTRAVENOUS
  Filled 2013-04-11: qty 1

## 2013-04-11 MED ORDER — SODIUM CHLORIDE 0.9 % IV BOLUS (SEPSIS)
1000.0000 mL | Freq: Once | INTRAVENOUS | Status: AC
  Administered 2013-04-11: 1000 mL via INTRAVENOUS

## 2013-04-11 MED ORDER — INSULIN ASPART 100 UNIT/ML ~~LOC~~ SOLN
15.0000 [IU] | Freq: Once | SUBCUTANEOUS | Status: AC
  Administered 2013-04-11: 15 [IU] via SUBCUTANEOUS
  Filled 2013-04-11: qty 1

## 2013-04-11 MED ORDER — POTASSIUM CHLORIDE CRYS ER 20 MEQ PO TBCR
40.0000 meq | EXTENDED_RELEASE_TABLET | Freq: Once | ORAL | Status: AC
  Administered 2013-04-11: 40 meq via ORAL
  Filled 2013-04-11: qty 2

## 2013-04-11 NOTE — ED Notes (Signed)
Elevated bs, onset today.

## 2013-04-11 NOTE — ED Notes (Signed)
Called lab for redraw of BMP due to difference in CBG.

## 2013-04-11 NOTE — ED Provider Notes (Addendum)
CSN: [id number]     Arrival date & time 04/11/13  1624 History   First MD Initiated Contact with Patient 04/11/13 1758     Chief Complaint  Patient presents with  . Hyperglycemia   (Consider location/radiation/quality/duration/timing/severity/associated sxs/prior Treatment) Patient is a 72 y.o. female presenting with hyperglycemia.  Hyperglycemia Associated symptoms: dysuria    Place of generalized weakness for approximately a week. Denies noncompliance with medications. Other associated symptoms include dysuria or for past week. Denies fever denies cough denies chest pain denies nausea or vomiting. No treatment prior to coming here. Her son reports her blood sugars have been between 380 and 556 today. She ate only half a hamburger today but she is presently hungry. Past Medical History  Diagnosis Date  . Diabetes mellitus   . Hypertension   . Hyperlipemia   . Fibromyalgia   . Obesity   . Sleep apnea     No CPAP  . Anxiety   . Hiatal hernia   . NSTEMI (non-ST elevated myocardial infarction) 04/27/2011  . Anemia 04/27/2011  . Restless leg syndrome   . CAD (coronary artery disease)     small dominant right coronary artery with diffuse 95% stenosis. The LAD had 70% stenosis in the mid vessel beyond the diagonal branch but was a small vessel. Circumflex had proximal 30% stenosis.   Marland Kitchen External hemorrhoids   . Arrhythmia 10/11/2011  . Community acquired pneumonia 06/24/2012  . Renal insufficiency 05/06/2011  . Shingles    Past Surgical History  Procedure Laterality Date  . Appendectomy    . Fracture surgery      left humerous  . Ganglion cyst removal     Family History  Problem Relation Age of Onset  . Stomach cancer Mother   . Heart disease Maternal Grandmother   . Heart disease Maternal Grandfather    History  Substance Use Topics  . Smoking status: Never Smoker   . Smokeless tobacco: Never Used  . Alcohol Use: No   OB History   Grav Para Term Preterm Abortions TAB SAB  Ect Mult Living                 Review of Systems  Constitutional: Positive for appetite change.  Genitourinary: Positive for dysuria.  Neurological: Positive for weakness.       Walks with walker, stuttering speech since her stroke  All other systems reviewed and are negative.    Allergies  Codeine  Home Medications   Current Outpatient Rx  Name  Route  Sig  Dispense  Refill  . carvedilol (COREG) 6.25 MG tablet   Oral   Take 6.25 mg by mouth 2 (two) times daily with a meal.         . Cyanocobalamin 500 MCG SUBL   Sublingual   Place 1 tablet under the tongue 2 (two) times daily.         Marland Kitchen docusate sodium (COLACE) 250 MG capsule   Oral   Take 250 mg by mouth daily.         . Ferrous Sulfate 27 MG TABS   Oral   Take 1 tablet by mouth 2 (two) times daily.         . fluocinonide cream (LIDEX) 0.05 %   Topical   Apply 1 application topically 2 (two) times daily as needed. Sore on leg         . glimepiride (AMARYL) 4 MG tablet   Oral   Take 4 mg by  mouth 2 (two) times daily.         Marland Kitchen glipiZIDE (GLUCOTROL) 10 MG tablet   Oral   Take 10 mg by mouth daily before breakfast.         . insulin glargine (LANTUS) 100 UNIT/ML injection   Subcutaneous   Inject 25 Units into the skin 2 (two) times daily.         . isosorbide mononitrate (IMDUR) 30 MG 24 hr tablet   Oral   Take 30 mg by mouth daily.         . Liraglutide (VICTOZA) 18 MG/3ML SOPN   Subcutaneous   Inject 18 mg into the skin daily.         . nitroGLYCERIN (NITROSTAT) 0.4 MG SL tablet   Sublingual   Place 1 mg under the tongue every 5 (five) minutes x 3 doses as needed for chest pain. For chest pain         . pantoprazole (PROTONIX) 40 MG tablet   Oral   Take 40 mg by mouth daily.         . pregabalin (LYRICA) 50 MG capsule   Oral   Take 50 mg by mouth daily.          . rosuvastatin (CRESTOR) 20 MG tablet   Oral   Take 20 mg by mouth at bedtime.           . torsemide  (DEMADEX) 20 MG tablet   Oral   Take 2 tablets (40 mg total) by mouth daily.   60 tablet   0   . valACYclovir (VALTREX) 1000 MG tablet   Oral   Take 1 tablet (1,000 mg total) by mouth 3 (three) times daily.   30 tablet   0    BP 142/100  Pulse 101  Temp(Src) 97.4 F (36.3 C) (Oral)  Resp 15  Ht 5\' 1"  (1.549 m)  Wt 237 lb (107.502 kg)  BMI 44.80 kg/m2  SpO2 96% Physical Exam  Nursing note and vitals reviewed. Constitutional: She appears well-developed and well-nourished.  HENT:  Head: Normocephalic and atraumatic.  Right Ear: External ear normal.  Left Ear: External ear normal.  Mucous membranes dry, edentulous  Eyes: Conjunctivae and EOM are normal. Pupils are equal, round, and reactive to light. Right eye exhibits no discharge. Left eye exhibits no discharge. No scleral icterus.  No subcutaneous conjunctival erythema  Neck: Neck supple. No tracheal deviation present. No thyromegaly present.  Cardiovascular: Normal rate and regular rhythm.   No murmur heard. Pulmonary/Chest: Effort normal and breath sounds normal.  Abdominal: Soft. Bowel sounds are normal. She exhibits no distension. There is no tenderness.  Morbidly obese  Musculoskeletal: Normal range of motion. She exhibits no edema and no tenderness.  Neurological: She is alert. She exhibits normal muscle tone. Coordination normal.  Walks with minimal assistance  Skin: Skin is warm and dry. No rash noted.  Minimally reddened rash over right fore head.  Psychiatric: She has a normal mood and affect.    ED Course  Procedures (including critical care time) Labs Review Labs Reviewed  GLUCOSE, CAPILLARY - Abnormal; Notable for the following:    Glucose-Capillary 449 (*)    All other components within normal limits  CBC WITH DIFFERENTIAL  COMPREHENSIVE METABOLIC PANEL  URINALYSIS, ROUTINE W REFLEX MICROSCOPIC   Imaging Review No results found.  EKG Interpretation     Ventricular Rate:  103 PR Interval:     QRS Duration: 128 QT Interval:  382 QTC Calculation: 500 R Axis:   39 Text Interpretation:  Atrial fibrillation with rapid ventricular response Right bundle branch block Abnormal ECG When compared with ECG of 09-Apr-2013 09:28, No significant change was found           Started on quickly stabilizer, and administered intravenous fluids to treat hyperglycemia  Results for orders placed during the hospital encounter of 04/11/13  CBC WITH DIFFERENTIAL      Result Value Range   WBC 7.4  4.0 - 10.5 K/uL   RBC 4.13  3.87 - 5.11 MIL/uL   Hemoglobin 11.8 (*) 12.0 - 15.0 g/dL   HCT 16.1 (*) 09.6 - 04.5 %   MCV 86.7  78.0 - 100.0 fL   MCH 28.6  26.0 - 34.0 pg   MCHC 33.0  30.0 - 36.0 g/dL   RDW 40.9  81.1 - 91.4 %   Platelets 244  150 - 400 K/uL   Neutrophils Relative % 64  43 - 77 %   Neutro Abs 4.7  1.7 - 7.7 K/uL   Lymphocytes Relative 28  12 - 46 %   Lymphs Abs 2.1  0.7 - 4.0 K/uL   Monocytes Relative 6  3 - 12 %   Monocytes Absolute 0.5  0.1 - 1.0 K/uL   Eosinophils Relative 2  0 - 5 %   Eosinophils Absolute 0.2  0.0 - 0.7 K/uL   Basophils Relative 0  0 - 1 %   Basophils Absolute 0.0  0.0 - 0.1 K/uL  COMPREHENSIVE METABOLIC PANEL      Result Value Range   Sodium 141  135 - 145 mEq/L   Potassium 3.8  3.5 - 5.1 mEq/L   Chloride 104  96 - 112 mEq/L   CO2 28  19 - 32 mEq/L   Glucose, Bld 102 (*) 70 - 99 mg/dL   BUN 20  6 - 23 mg/dL   Creatinine, Ser 7.82  0.50 - 1.10 mg/dL   Calcium 9.8  8.4 - 95.6 mg/dL   Total Protein 7.5  6.0 - 8.3 g/dL   Albumin 3.9  3.5 - 5.2 g/dL   AST 16  0 - 37 U/L   ALT 17  0 - 35 U/L   Alkaline Phosphatase 113  39 - 117 U/L   Total Bilirubin 0.3  0.3 - 1.2 mg/dL   GFR calc non Af Amer 55 (*) >90 mL/min   GFR calc Af Amer 64 (*) >90 mL/min  GLUCOSE, CAPILLARY      Result Value Range   Glucose-Capillary 449 (*) 70 - 99 mg/dL  URINALYSIS, ROUTINE W REFLEX MICROSCOPIC      Result Value Range   Color, Urine YELLOW  YELLOW   APPearance CLEAR   CLEAR   Specific Gravity, Urine 1.025  1.005 - 1.030   pH 5.5  5.0 - 8.0   Glucose, UA 500 (*) NEGATIVE mg/dL   Hgb urine dipstick TRACE (*) NEGATIVE   Bilirubin Urine NEGATIVE  NEGATIVE   Ketones, ur NEGATIVE  NEGATIVE mg/dL   Protein, ur 213 (*) NEGATIVE mg/dL   Urobilinogen, UA 0.2  0.0 - 1.0 mg/dL   Nitrite NEGATIVE  NEGATIVE   Leukocytes, UA NEGATIVE  NEGATIVE  TROPONIN I      Result Value Range   Troponin I <0.30  <0.30 ng/mL  GLUCOSE, CAPILLARY      Result Value Range   Glucose-Capillary 466 (*) 70 - 99 mg/dL  BASIC METABOLIC PANEL  Result Value Range   Sodium 131 (*) 135 - 145 mEq/L   Potassium 3.3 (*) 3.5 - 5.1 mEq/L   Chloride 97  96 - 112 mEq/L   CO2 22  19 - 32 mEq/L   Glucose, Bld 510 (*) 70 - 99 mg/dL   BUN 45 (*) 6 - 23 mg/dL   Creatinine, Ser 6.57 (*) 0.50 - 1.10 mg/dL   Calcium 8.9  8.4 - 84.6 mg/dL   GFR calc non Af Amer 22 (*) >90 mL/min   GFR calc Af Amer 25 (*) >90 mL/min  URINE MICROSCOPIC-ADD ON      Result Value Range   WBC, UA 0-2  <3 WBC/hpf   RBC / HPF 0-2  <3 RBC/hpf   Bacteria, UA FEW (*) RARE   Casts GRANULAR CAST (*) NEGATIVE  CBC WITH DIFFERENTIAL      Result Value Range   WBC 6.7  4.0 - 10.5 K/uL   RBC 3.71 (*) 3.87 - 5.11 MIL/uL   Hemoglobin 10.6 (*) 12.0 - 15.0 g/dL   HCT 96.2 (*) 95.2 - 84.1 %   MCV 88.4  78.0 - 100.0 fL   MCH 28.6  26.0 - 34.0 pg   MCHC 32.3  30.0 - 36.0 g/dL   RDW 32.4  40.1 - 02.7 %   Platelets 145 (*) 150 - 400 K/uL   Neutrophils Relative % 48  43 - 77 %   Neutro Abs 3.2  1.7 - 7.7 K/uL   Lymphocytes Relative 37  12 - 46 %   Lymphs Abs 2.5  0.7 - 4.0 K/uL   Monocytes Relative 9  3 - 12 %   Monocytes Absolute 0.6  0.1 - 1.0 K/uL   Eosinophils Relative 5  0 - 5 %   Eosinophils Absolute 0.4  0.0 - 0.7 K/uL   Basophils Relative 0  0 - 1 %   Basophils Absolute 0.0  0.0 - 0.1 K/uL  GLUCOSE, CAPILLARY      Result Value Range   Glucose-Capillary 474 (*) 70 - 99 mg/dL  GLUCOSE, CAPILLARY      Result Value  Range   Glucose-Capillary 427 (*) 70 - 99 mg/dL   No results found.  MDM  No diagnosis found.  note initial basic metabolic panel and CBC felt to be factitious due to glucose of 102. Samples were redrawn Patie tsigned out to Dr. Estell Harpin 1030 pm Diagnosis #1 hyperglycemia #2 dehydration #3 hypokalemia #4 hypertension   Doug Sou, MD 04/11/13 2536  Doug Sou, MD 04/11/13 2249

## 2013-04-11 NOTE — ED Notes (Addendum)
Pt. C/o weakness over last several days. New onset of tremors.  Speech is halting at times, but states this is from prior CVA.  Patient takes Lantus 25 units qam and qpm.  Takes Victoza 9mg  ac lunch.  Wednesday, CBG was over 400 at PMDs office and was sent home.  Later came to ER.  Was told to track CBGs.  Which have been from 380 to 556.  States her appetitte has been off.  She has been having stinging and burning with urination.  Denies n/v/d and fevers.

## 2013-04-11 NOTE — ED Notes (Signed)
Gave patient meal tray as requested and approved by MD. 

## 2013-04-11 NOTE — ED Provider Notes (Signed)
Patient received from Drs. Jucaubowitz and Zammitt. She was placed on insulin for hyperglycemia. Glucose is down to 211. Renal function is at baseline (one outlier BUN/Cr was normal, but all other readings have been similar to today). She is discharged with instructions to follow up with PCP.  Dione Booze, MD 04/11/13 [phone]

## 2013-04-11 NOTE — ED Notes (Signed)
Patient complaining of right leg cramp. Massaged patients leg as requested. Patient states it feels better.

## 2013-04-13 ENCOUNTER — Emergency Department (HOSPITAL_COMMUNITY): Payer: Medicare Other

## 2013-04-13 ENCOUNTER — Encounter (HOSPITAL_COMMUNITY): Payer: Self-pay | Admitting: Emergency Medicine

## 2013-04-13 ENCOUNTER — Emergency Department (HOSPITAL_COMMUNITY)
Admission: EM | Admit: 2013-04-13 | Discharge: 2013-04-13 | Disposition: A | Discharge status: Home / Self Care | Payer: Medicare Other | Attending: Emergency Medicine | Admitting: Emergency Medicine

## 2013-04-13 DIAGNOSIS — E785 Hyperlipidemia, unspecified: Secondary | ICD-10-CM | POA: Insufficient documentation

## 2013-04-13 DIAGNOSIS — Y939 Activity, unspecified: Secondary | ICD-10-CM | POA: Insufficient documentation

## 2013-04-13 DIAGNOSIS — D649 Anemia, unspecified: Secondary | ICD-10-CM | POA: Insufficient documentation

## 2013-04-13 DIAGNOSIS — Y92009 Unspecified place in unspecified non-institutional (private) residence as the place of occurrence of the external cause: Secondary | ICD-10-CM | POA: Insufficient documentation

## 2013-04-13 DIAGNOSIS — I10 Essential (primary) hypertension: Secondary | ICD-10-CM | POA: Insufficient documentation

## 2013-04-13 DIAGNOSIS — E669 Obesity, unspecified: Secondary | ICD-10-CM | POA: Insufficient documentation

## 2013-04-13 DIAGNOSIS — Z79899 Other long term (current) drug therapy: Secondary | ICD-10-CM | POA: Insufficient documentation

## 2013-04-13 DIAGNOSIS — Z87448 Personal history of other diseases of urinary system: Secondary | ICD-10-CM | POA: Insufficient documentation

## 2013-04-13 DIAGNOSIS — G2581 Restless legs syndrome: Secondary | ICD-10-CM | POA: Insufficient documentation

## 2013-04-13 DIAGNOSIS — S52502A Unspecified fracture of the lower end of left radius, initial encounter for closed fracture: Secondary | ICD-10-CM

## 2013-04-13 DIAGNOSIS — S298XXA Other specified injuries of thorax, initial encounter: Secondary | ICD-10-CM | POA: Insufficient documentation

## 2013-04-13 DIAGNOSIS — I2510 Atherosclerotic heart disease of native coronary artery without angina pectoris: Secondary | ICD-10-CM | POA: Insufficient documentation

## 2013-04-13 DIAGNOSIS — Z8701 Personal history of pneumonia (recurrent): Secondary | ICD-10-CM | POA: Insufficient documentation

## 2013-04-13 DIAGNOSIS — Z8739 Personal history of other diseases of the musculoskeletal system and connective tissue: Secondary | ICD-10-CM | POA: Insufficient documentation

## 2013-04-13 DIAGNOSIS — I252 Old myocardial infarction: Secondary | ICD-10-CM | POA: Insufficient documentation

## 2013-04-13 DIAGNOSIS — W19XXXA Unspecified fall, initial encounter: Secondary | ICD-10-CM | POA: Insufficient documentation

## 2013-04-13 DIAGNOSIS — R259 Unspecified abnormal involuntary movements: Secondary | ICD-10-CM | POA: Insufficient documentation

## 2013-04-13 DIAGNOSIS — R0602 Shortness of breath: Secondary | ICD-10-CM | POA: Insufficient documentation

## 2013-04-13 DIAGNOSIS — F411 Generalized anxiety disorder: Secondary | ICD-10-CM | POA: Insufficient documentation

## 2013-04-13 DIAGNOSIS — B029 Zoster without complications: Secondary | ICD-10-CM | POA: Insufficient documentation

## 2013-04-13 DIAGNOSIS — Z794 Long term (current) use of insulin: Secondary | ICD-10-CM | POA: Insufficient documentation

## 2013-04-13 DIAGNOSIS — S52599A Other fractures of lower end of unspecified radius, initial encounter for closed fracture: Secondary | ICD-10-CM | POA: Insufficient documentation

## 2013-04-13 DIAGNOSIS — Z8719 Personal history of other diseases of the digestive system: Secondary | ICD-10-CM | POA: Insufficient documentation

## 2013-04-13 DIAGNOSIS — E119 Type 2 diabetes mellitus without complications: Secondary | ICD-10-CM | POA: Insufficient documentation

## 2013-04-13 LAB — URINALYSIS, ROUTINE W REFLEX MICROSCOPIC
Ketones, ur: NEGATIVE mg/dL
Leukocytes, UA: NEGATIVE
Protein, ur: 100 mg/dL — AB
Specific Gravity, Urine: 1.012 (ref 1.005–1.030)
Urobilinogen, UA: 0.2 mg/dL (ref 0.0–1.0)
pH: 5 (ref 5.0–8.0)

## 2013-04-13 LAB — POCT I-STAT TROPONIN I: Troponin i, poc: 0.02 ng/mL (ref 0.00–0.08)

## 2013-04-13 LAB — BASIC METABOLIC PANEL
CO2: 20 mEq/L (ref 19–32)
Chloride: 103 mEq/L (ref 96–112)
GFR calc Af Amer: 23 mL/min — ABNORMAL LOW (ref >90)
Glucose, Bld: 429 mg/dL — ABNORMAL HIGH (ref 70–99)
Potassium: 4.2 mEq/L (ref 3.5–5.1)
Sodium: 136 mEq/L (ref 135–145)

## 2013-04-13 LAB — CBC WITH DIFFERENTIAL/PLATELET
Basophils Absolute: 0 10*3/uL (ref 0.0–0.1)
Basophils Relative: 1 % (ref 0–1)
Lymphs Abs: 2.2 10*3/uL (ref 0.7–4.0)
MCHC: 32.8 g/dL (ref 30.0–36.0)
Neutro Abs: 3.3 10*3/uL (ref 1.7–7.7)
Neutrophils Relative %: 52 % (ref 43–77)
RDW: 14.6 % (ref 11.5–15.5)
WBC: 6.3 10*3/uL (ref 4.0–10.5)

## 2013-04-13 LAB — GLUCOSE, CAPILLARY
Glucose-Capillary: 385 mg/dL — ABNORMAL HIGH (ref 70–99)
Glucose-Capillary: 310 mg/dL — ABNORMAL HIGH (ref 70–99)

## 2013-04-13 LAB — PRO B NATRIURETIC PEPTIDE: Pro B Natriuretic peptide (BNP): 4581 pg/mL — ABNORMAL HIGH (ref 0–125)

## 2013-04-13 MED ORDER — PREGABALIN 50 MG PO CAPS
100.0000 mg | ORAL_CAPSULE | Freq: Once | ORAL | Status: AC
  Administered 2013-04-13: 100 mg via ORAL
  Filled 2013-04-13: qty 2

## 2013-04-13 MED ORDER — IBUPROFEN 400 MG PO TABS
400.0000 mg | ORAL_TABLET | Freq: Once | ORAL | Status: AC
  Administered 2013-04-13: 400 mg via ORAL
  Filled 2013-04-13: qty 1

## 2013-04-13 MED ORDER — SODIUM CHLORIDE 0.9 % IV BOLUS (SEPSIS)
1000.0000 mL | Freq: Once | INTRAVENOUS | Status: AC
  Administered 2013-04-13: 1000 mL via INTRAVENOUS

## 2013-04-13 NOTE — Progress Notes (Signed)
Orthopedic Tech Progress Note Patient Details:  [name] February 06, 1941 [id number]  Ortho Devices Type of Ortho Device: Ace wrap;Arm sling;Sugartong splint Ortho Device/Splint Location: lue Ortho Device/Splint Interventions: Application   [name] 04/13/2013, 6:06 PM

## 2013-04-13 NOTE — ED Provider Notes (Signed)
CSN: [id number]     Arrival date & time 04/13/13  1245 History   First MD Initiated Contact with Patient 04/13/13 1254     Chief Complaint  Patient presents with  . Fall   (Consider location/radiation/quality/duration/timing/severity/associated sxs/prior Treatment) The history is provided by the patient and medical records.   This is a 72 year old female with past medical history significant for hypertension, diabetes, CAD, anemia presenting to the ED for recurrent falls. Per son patient has "not been acting right" for approximately one week. He notes she was trying to eat breakfast this morning and she could not even hold the spoon due to tremors of her right hand.  He states that her blood sugars have been consistently elevated above 400. Patient admits to recurrent falls, states she has been following on her left side, which impacts on her left arm in attempt to brace herself.  States she may or may not have hit her head but is not entirely sure.  States she has a walker at home but has not been using at like she is supposed to.  States she has had some left-sided chest pain associated with some shortness of breath.  Patient has not checked her weight recently.  Denies any abdominal pain, nausea, vomiting, fever, cough, or other cold sx.  Pt currently being treated for shingles with acyclovir.  No new medication changes.  Past Medical History  Diagnosis Date  . Diabetes mellitus   . Hypertension   . Hyperlipemia   . Fibromyalgia   . Obesity   . Sleep apnea     No CPAP  . Anxiety   . Hiatal hernia   . NSTEMI (non-ST elevated myocardial infarction) 04/27/2011  . Anemia 04/27/2011  . Restless leg syndrome   . CAD (coronary artery disease)     small dominant right coronary artery with diffuse 95% stenosis. The LAD had 70% stenosis in the mid vessel beyond the diagonal branch but was a small vessel. Circumflex had proximal 30% stenosis.   Marland Kitchen External hemorrhoids   . Arrhythmia 10/11/2011  .  Community acquired pneumonia 06/24/2012  . Renal insufficiency 05/06/2011  . Shingles    Past Surgical History  Procedure Laterality Date  . Appendectomy    . Fracture surgery      left humerous  . Ganglion cyst removal     Family History  Problem Relation Age of Onset  . Stomach cancer Mother   . Heart disease Maternal Grandmother   . Heart disease Maternal Grandfather    History  Substance Use Topics  . Smoking status: Never Smoker   . Smokeless tobacco: Never Used  . Alcohol Use: No   OB History   Grav Para Term Preterm Abortions TAB SAB Ect Mult Living                 Review of Systems  Respiratory: Positive for shortness of breath.   Cardiovascular: Positive for chest pain.  Musculoskeletal: Positive for arthralgias.  All other systems reviewed and are negative.    Allergies  Codeine  Home Medications   Current Outpatient Rx  Name  Route  Sig  Dispense  Refill  . carvedilol (COREG) 6.25 MG tablet   Oral   Take 6.25 mg by mouth 2 (two) times daily with a meal.         . Cyanocobalamin 500 MCG SUBL   Sublingual   Place 1 tablet under the tongue 2 (two) times daily.         Marland Kitchen  docusate sodium (COLACE) 250 MG capsule   Oral   Take 250 mg by mouth daily.         . Ferrous Sulfate 27 MG TABS   Oral   Take 1 tablet by mouth 2 (two) times daily.         . fluocinonide cream (LIDEX) 0.05 %   Topical   Apply 1 application topically 2 (two) times daily as needed. Sore on leg         . glimepiride (AMARYL) 4 MG tablet   Oral   Take 4 mg by mouth 2 (two) times daily.         Marland Kitchen glipiZIDE (GLUCOTROL) 10 MG tablet   Oral   Take 10 mg by mouth daily before breakfast.         . insulin glargine (LANTUS) 100 UNIT/ML injection   Subcutaneous   Inject 25 Units into the skin 2 (two) times daily.         . isosorbide mononitrate (IMDUR) 30 MG 24 hr tablet   Oral   Take 30 mg by mouth daily.         . Liraglutide (VICTOZA) 18 MG/3ML SOPN    Subcutaneous   Inject 18 mg into the skin daily.         . nitroGLYCERIN (NITROSTAT) 0.4 MG SL tablet   Sublingual   Place 1 mg under the tongue every 5 (five) minutes x 3 doses as needed for chest pain. For chest pain         . pantoprazole (PROTONIX) 40 MG tablet   Oral   Take 40 mg by mouth daily.         . pregabalin (LYRICA) 50 MG capsule   Oral   Take 50 mg by mouth every evening.          . rosuvastatin (CRESTOR) 20 MG tablet   Oral   Take 20 mg by mouth at bedtime.           . torsemide (DEMADEX) 20 MG tablet   Oral   Take 2 tablets (40 mg total) by mouth daily.   60 tablet   0   . traMADol (ULTRAM) 50 MG tablet               . valACYclovir (VALTREX) 1000 MG tablet   Oral   Take 1 tablet (1,000 mg total) by mouth 3 (three) times daily.   30 tablet   0    BP 122/93  Pulse 55  Temp(Src) 97.6 F (36.4 C) (Oral)  Resp 16  Wt 234 lb (106.142 kg)  SpO2 97%  Physical Exam  Nursing note and vitals reviewed. Constitutional: She is oriented to person, place, and time. No distress.  obese  HENT:  Head: Normocephalic and atraumatic.  Mouth/Throat: Oropharynx is clear and moist.  Shingles rash surrounding right eye, forehead and scalp  Eyes: Conjunctivae and EOM are normal. Pupils are equal, round, and reactive to light.  Neck: Normal range of motion.  Cardiovascular: Normal rate, regular rhythm and normal heart sounds.   Pulmonary/Chest: Effort normal. No accessory muscle usage. No respiratory distress. She has decreased breath sounds.  Diminished breath sounds bilaterally; no accessory muscle use or signs of distress  Abdominal: Soft. Bowel sounds are normal. There is no tenderness. There is no guarding.  Musculoskeletal: Normal range of motion.       Left forearm: She exhibits tenderness and bony tenderness. She exhibits no swelling, no edema and  no deformity.  TTP and bruising of left dorsal forearm along ulnar aspect; no gross deformity noted;  limited movement of left wrist due to pain; strong radial pulse and cap refill; sensation intact  Neurological: She is alert and oriented to person, place, and time. She has normal strength. She displays tremor. No cranial nerve deficit or sensory deficit. She displays no seizure activity.  CN grossly intact, mild tremor of right hand; equal strength UE and LE bilaterally, no focal neuro deficits or facial droop appreciated; slow gait but non-ataxic  Skin: Skin is warm and dry. She is not diaphoretic.  Psychiatric: She has a normal mood and affect.  Stuttering speech as baseline    ED Course  Procedures (including critical care time) Labs Review Labs Reviewed  CBC WITH DIFFERENTIAL - Abnormal; Notable for the following:    RBC 3.67 (*)    Hemoglobin 10.5 (*)    HCT 32.0 (*)    Platelets 126 (*)    All other components within normal limits  BASIC METABOLIC PANEL - Abnormal; Notable for the following:    Glucose, Bld 429 (*)    BUN 41 (*)    Creatinine, Ser 2.32 (*)    Calcium 8.3 (*)    GFR calc non Af Amer 20 (*)    GFR calc Af Amer 23 (*)    All other components within normal limits  PRO B NATRIURETIC PEPTIDE - Abnormal; Notable for the following:    Pro B Natriuretic peptide (BNP) 4581.0 (*)    All other components within normal limits  URINALYSIS, ROUTINE W REFLEX MICROSCOPIC - Abnormal; Notable for the following:    Glucose, UA 500 (*)    Hgb urine dipstick SMALL (*)    Protein, ur 100 (*)    All other components within normal limits  GLUCOSE, CAPILLARY - Abnormal; Notable for the following:    Glucose-Capillary 385 (*)    All other components within normal limits  GLUCOSE, CAPILLARY - Abnormal; Notable for the following:    Glucose-Capillary 310 (*)    All other components within normal limits  URINE MICROSCOPIC-ADD ON  POCT I-STAT TROPONIN I   Imaging Review No results found.  EKG Interpretation     Ventricular Rate:  83 PR Interval:    QRS Duration: 135 QT  Interval:  411 QTC Calculation: 483 R Axis:   81 Text Interpretation:  Atrial fibrillation Right bundle branch block No significant change since last tracing            MDM   1. Distal radius fracture, left, closed, initial encounter   2. Fall at home, initial encounter   3. Shingles rash    Pt with AFIB with/rate controlled, unchanged from previous.  Trop negative.  Labs as above, glucose 429 but no significant electrolyte abnormalities to suggest DKA.  BNP elevated but improved from previous. CXR without vascular congestion or frank pulmonary edema. U/a without signs of infection, no ketones.  CT head negative for acute findings.  Pt does have distal radius fracture but does not appear unstable.    Upon review of prior medical records, pt has had multiple visits for the same.  This appears to be pts baseline and there does not appear to be a clear explanation for her repetitive falls at this time, possibly due to face that pt is not using her walker as instructed.  Pt has remained afebrile in the ED, VS stable, NAD.  BS remains elevated but pt has poorly  controlled diabetes and i feel this is chronic.  She has no evidence of DKA at this time.  Pts left wrist placed in sugar-tong splint with shoulder sling-- FU with hand surgery, Dr. Izora Ribas.  FU with PCP for closer DM control and medication management.  Advised to use walker at all times when ambulating.  Discussed plan with pt and family at bedside, they agreed.  Return precautions advised.  Discussed with Dr. Wilkie Aye who agrees with assessment and plan of care.  Garlon Hatchet, PA-C 04/13/13 2025

## 2013-04-13 NOTE — ED Notes (Signed)
Foley cath inserted using sterile technique. Urine returned. Verbal order given by Fredricka Bonine

## 2013-04-13 NOTE — ED Notes (Signed)
Discharge instructions reviewed with pt and her sister. Both verbalized understanding of follow up instructions.

## 2013-04-13 NOTE — ED Notes (Addendum)
Pt states she does not feel well and she keeps falling at home. C/o L side pain after the falls. Son states she "Acts like shes out of it." pt states she was just discharged from [facility] last week for falls

## 2013-04-13 NOTE — ED Notes (Signed)
Pt states she has fallen 4 times in the last few days. Pt having difficult time finding words, when asked when did this begin pt's son says since pt was in the hospital at [facility]. Per pt her blood sugar and blood pressure have been high lately.

## 2013-04-14 LAB — COMPREHENSIVE METABOLIC PANEL

## 2013-04-14 LAB — CBC WITH DIFFERENTIAL/PLATELET

## 2013-04-14 NOTE — ED Provider Notes (Signed)
Medical screening examination/treatment/procedure(s) were conducted as a shared visit with non-physician practitioner(s) and myself.  I personally evaluated the patient during the encounter.  EKG Interpretation     Ventricular Rate:  83 PR Interval:    QRS Duration: 135 QT Interval:  411 QTC Calculation: 483 R Axis:   81 Text Interpretation:  Atrial fibrillation Right bundle branch block No significant change since last tracing            Patient presents with fall and hyperglycemia.  Patient has presented multiple times for recurrent falls.  Her glucose is also persistently elevated.  She is elderly and chronically ill appearing but non-focal on exam.  Deformity noted to the LUE with good NV exam.  Labwork is notable for hyperglycemia without gap.  Patient given fluids and insulin.  Patient has distal radius fracture which was splinted.  Patient tells me she has a home health nurse but denies this to [place], [place].  Patient likely needs evaluation for placement given recurrent falls and chronic medical conditions.  Face to Face consult placed for PT and placement evaluation.  Patient to be discharged into the care of her sons.    After history, exam, and medical workup I feel the patient has been appropriately medically screened and is safe for discharge home. Pertinent diagnoses were discussed with the patient. Patient was given return precautions.  Shon Baton, MD 04/14/13 1057

## 2013-04-17 ENCOUNTER — Emergency Department (HOSPITAL_COMMUNITY): Payer: Medicare Other

## 2013-04-17 ENCOUNTER — Encounter (HOSPITAL_COMMUNITY): Payer: Self-pay | Admitting: Emergency Medicine

## 2013-04-17 ENCOUNTER — Observation Stay (HOSPITAL_COMMUNITY)
Admission: EM | Admit: 2013-04-17 | Discharge: 2013-04-18 | Disposition: A | Discharge status: Skilled Nursing Facility | Payer: Medicare Other | Attending: Family Medicine | Admitting: Family Medicine

## 2013-04-17 ENCOUNTER — Observation Stay (HOSPITAL_COMMUNITY): Payer: Medicare Other

## 2013-04-17 DIAGNOSIS — N184 Chronic kidney disease, stage 4 (severe): Secondary | ICD-10-CM | POA: Diagnosis present

## 2013-04-17 DIAGNOSIS — G2581 Restless legs syndrome: Secondary | ICD-10-CM

## 2013-04-17 DIAGNOSIS — IMO0002 Reserved for concepts with insufficient information to code with codable children: Secondary | ICD-10-CM | POA: Diagnosis present

## 2013-04-17 DIAGNOSIS — B029 Zoster without complications: Secondary | ICD-10-CM

## 2013-04-17 DIAGNOSIS — R4781 Slurred speech: Secondary | ICD-10-CM

## 2013-04-17 DIAGNOSIS — I129 Hypertensive chronic kidney disease with stage 1 through stage 4 chronic kidney disease, or unspecified chronic kidney disease: Secondary | ICD-10-CM | POA: Insufficient documentation

## 2013-04-17 DIAGNOSIS — I4891 Unspecified atrial fibrillation: Principal | ICD-10-CM

## 2013-04-17 DIAGNOSIS — R531 Weakness: Secondary | ICD-10-CM

## 2013-04-17 DIAGNOSIS — E1142 Type 2 diabetes mellitus with diabetic polyneuropathy: Secondary | ICD-10-CM

## 2013-04-17 DIAGNOSIS — E1140 Type 2 diabetes mellitus with diabetic neuropathy, unspecified: Secondary | ICD-10-CM

## 2013-04-17 DIAGNOSIS — S62102D Fracture of unspecified carpal bone, left wrist, subsequent encounter for fracture with routine healing: Secondary | ICD-10-CM

## 2013-04-17 DIAGNOSIS — Z9181 History of falling: Secondary | ICD-10-CM | POA: Insufficient documentation

## 2013-04-17 DIAGNOSIS — R4789 Other speech disturbances: Secondary | ICD-10-CM | POA: Insufficient documentation

## 2013-04-17 DIAGNOSIS — R296 Repeated falls: Secondary | ICD-10-CM

## 2013-04-17 DIAGNOSIS — E1149 Type 2 diabetes mellitus with other diabetic neurological complication: Secondary | ICD-10-CM

## 2013-04-17 DIAGNOSIS — W19XXXA Unspecified fall, initial encounter: Secondary | ICD-10-CM

## 2013-04-17 DIAGNOSIS — Y92009 Unspecified place in unspecified non-institutional (private) residence as the place of occurrence of the external cause: Secondary | ICD-10-CM

## 2013-04-17 LAB — CBC WITH DIFFERENTIAL/PLATELET
Basophils Absolute: 0 10*3/uL (ref 0.0–0.1)
Eosinophils Relative: 2 % (ref 0–5)
HCT: 38.5 % (ref 36.0–46.0)
Lymphocytes Relative: 24 % (ref 12–46)
Lymphs Abs: 2.8 10*3/uL (ref 0.7–4.0)
MCHC: 33.2 g/dL (ref 30.0–36.0)
MCV: 86.9 fL (ref 78.0–100.0)
Monocytes Absolute: 0.8 10*3/uL (ref 0.1–1.0)
Monocytes Relative: 7 % (ref 3–12)
Neutro Abs: 7.7 10*3/uL (ref 1.7–7.7)
RBC: 4.43 MIL/uL (ref 3.87–5.11)
RDW: 14.4 % (ref 11.5–15.5)
WBC: 11.5 10*3/uL — ABNORMAL HIGH (ref 4.0–10.5)

## 2013-04-17 LAB — COMPREHENSIVE METABOLIC PANEL
ALT: 16 U/L (ref 0–35)
AST: 27 U/L (ref 0–37)
CO2: 25 mEq/L (ref 19–32)
Chloride: 98 mEq/L (ref 96–112)
Creatinine, Ser: 2.5 mg/dL — ABNORMAL HIGH (ref 0.50–1.10)
GFR calc Af Amer: 21 mL/min — ABNORMAL LOW (ref >90)
GFR calc non Af Amer: 18 mL/min — ABNORMAL LOW (ref >90)
Glucose, Bld: 249 mg/dL — ABNORMAL HIGH (ref 70–99)
Sodium: 136 mEq/L (ref 135–145)
Total Bilirubin: 0.4 mg/dL (ref 0.3–1.2)

## 2013-04-17 LAB — URINALYSIS, ROUTINE W REFLEX MICROSCOPIC
Bilirubin Urine: NEGATIVE
Ketones, ur: NEGATIVE mg/dL
Nitrite: NEGATIVE
Specific Gravity, Urine: 1.02 (ref 1.005–1.030)
pH: 6 (ref 5.0–8.0)

## 2013-04-17 LAB — URINE MICROSCOPIC-ADD ON

## 2013-04-17 LAB — GLUCOSE, CAPILLARY: Glucose-Capillary: 291 mg/dL — ABNORMAL HIGH (ref 70–99)

## 2013-04-17 MED ORDER — NITROGLYCERIN 0.6 MG SL SUBL
1.0000 mg | SUBLINGUAL_TABLET | SUBLINGUAL | Status: DC | PRN
  Filled 2013-04-17: qty 25

## 2013-04-17 MED ORDER — METOPROLOL TARTRATE 1 MG/ML IV SOLN
5.0000 mg | INTRAVENOUS | Status: AC | PRN
  Administered 2013-04-17 (×2): 5 mg via INTRAVENOUS
  Filled 2013-04-17 (×2): qty 5

## 2013-04-17 MED ORDER — ONDANSETRON HCL 4 MG/2ML IJ SOLN: 4.0000 mg | Freq: Four times a day (QID) | INTRAMUSCULAR | Status: DC | PRN

## 2013-04-17 MED ORDER — INSULIN ASPART 100 UNIT/ML ~~LOC~~ SOLN
6.0000 [IU] | Freq: Three times a day (TID) | SUBCUTANEOUS | Status: DC
  Administered 2013-04-17 – 2013-04-18 (×4): 6 [IU] via SUBCUTANEOUS

## 2013-04-17 MED ORDER — SODIUM CHLORIDE 0.9 % IV SOLN: 250.0000 mL | INTRAVENOUS | Status: DC | PRN

## 2013-04-17 MED ORDER — SODIUM CHLORIDE 0.9 % IJ SOLN
3.0000 mL | Freq: Two times a day (BID) | INTRAMUSCULAR | Status: DC
  Administered 2013-04-17 – 2013-04-18 (×2): 3 mL via INTRAVENOUS

## 2013-04-17 MED ORDER — VALACYCLOVIR HCL 500 MG PO TABS
ORAL_TABLET | ORAL | Status: AC
  Filled 2013-04-17: qty 2

## 2013-04-17 MED ORDER — CARVEDILOL 12.5 MG PO TABS
12.5000 mg | ORAL_TABLET | Freq: Two times a day (BID) | ORAL | Status: DC
  Administered 2013-04-17 – 2013-04-18 (×3): 12.5 mg via ORAL
  Filled 2013-04-17 (×4): qty 1

## 2013-04-17 MED ORDER — ACETAMINOPHEN 650 MG RE SUPP: 650.0000 mg | Freq: Four times a day (QID) | RECTAL | Status: DC | PRN

## 2013-04-17 MED ORDER — VALACYCLOVIR HCL 500 MG PO TABS
1000.0000 mg | ORAL_TABLET | Freq: Three times a day (TID) | ORAL | Status: DC
  Administered 2013-04-17 – 2013-04-18 (×3): 1000 mg via ORAL
  Filled 2013-04-17 (×12): qty 2

## 2013-04-17 MED ORDER — HEPARIN SODIUM (PORCINE) 5000 UNIT/ML IJ SOLN
5000.0000 [IU] | Freq: Three times a day (TID) | INTRAMUSCULAR | Status: DC
  Administered 2013-04-17 – 2013-04-18 (×3): 5000 [IU] via SUBCUTANEOUS
  Filled 2013-04-17 (×3): qty 1

## 2013-04-17 MED ORDER — ONDANSETRON HCL 4 MG PO TABS: 4.0000 mg | ORAL_TABLET | Freq: Four times a day (QID) | ORAL | Status: DC | PRN

## 2013-04-17 MED ORDER — INSULIN GLARGINE 100 UNIT/ML ~~LOC~~ SOLN
45.0000 [IU] | Freq: Every day | SUBCUTANEOUS | Status: DC
  Administered 2013-04-18: 45 [IU] via SUBCUTANEOUS
  Filled 2013-04-17 (×4): qty 0.45

## 2013-04-17 MED ORDER — ACETAMINOPHEN 325 MG PO TABS
650.0000 mg | ORAL_TABLET | Freq: Four times a day (QID) | ORAL | Status: DC | PRN
  Administered 2013-04-17 – 2013-04-18 (×2): 650 mg via ORAL
  Filled 2013-04-17 (×2): qty 2

## 2013-04-17 MED ORDER — ATORVASTATIN CALCIUM 20 MG PO TABS
20.0000 mg | ORAL_TABLET | Freq: Every day | ORAL | Status: DC
  Administered 2013-04-17 – 2013-04-18 (×2): 20 mg via ORAL
  Filled 2013-04-17 (×2): qty 1

## 2013-04-17 MED ORDER — TORSEMIDE 20 MG PO TABS
40.0000 mg | ORAL_TABLET | Freq: Every day | ORAL | Status: DC
  Administered 2013-04-18: 40 mg via ORAL
  Filled 2013-04-17: qty 2

## 2013-04-17 MED ORDER — PREGABALIN 50 MG PO CAPS
100.0000 mg | ORAL_CAPSULE | Freq: Every evening | ORAL | Status: DC
  Administered 2013-04-17 – 2013-04-18 (×2): 100 mg via ORAL
  Filled 2013-04-17 (×3): qty 2

## 2013-04-17 MED ORDER — ISOSORBIDE MONONITRATE ER 60 MG PO TB24
30.0000 mg | ORAL_TABLET | Freq: Every day | ORAL | Status: DC
  Administered 2013-04-18: 30 mg via ORAL
  Filled 2013-04-17: qty 1

## 2013-04-17 MED ORDER — SODIUM CHLORIDE 0.9 % IJ SOLN: 3.0000 mL | INTRAMUSCULAR | Status: DC | PRN

## 2013-04-17 NOTE — ED Notes (Addendum)
Pt states the only reason she is here is because she is unable to care for herself. Pt states, "I live with four boys and they are all at work." NAD>  Rash to left forehead and scalp, dried, red rash. No blistering noted. Previously dx with shingles during ER visit.

## 2013-04-17 NOTE — ED Notes (Signed)
Hospitalist currently at bedside

## 2013-04-17 NOTE — ED Notes (Signed)
Per EMS, pt's sister sent patient here for nursing home placement. States patient is unable to care for herself at home any longer. Pt has shingles now and a splint on her left arm

## 2013-04-17 NOTE — ED Notes (Signed)
Assisted pt with bedpan x 2. Pt repositioned in bed. Lab at bedside. EKG obtained.

## 2013-04-17 NOTE — ED Notes (Signed)
Attempted x 3 for IV success without success, CN notified and will assess

## 2013-04-17 NOTE — ED Notes (Signed)
Attempted IV x 2 with no success. 

## 2013-04-17 NOTE — ED Notes (Signed)
Pt urinated in bed. Linens changed and pt repositioned.

## 2013-04-17 NOTE — H&P (Signed)
History and Physical  [name] ZOX:[id number] DOB: 1940/10/16 DOA: 04/17/2013  Referring physician: Dr. Rolland Porter in ED PCP: Josue Hector, MD   Chief Complaint: Weak  HPI:  72 year old woman with multiple medical problems presented to the emergency department with her sister with progressive generalized weakness, intermittent slurred speech, recurrent falls and fluctuating hyperglycemia into the 600s. Initial evaluation was notable for atrial fibrillation with rapid ventricular response and possibly intermittently slurred speech. She is noted to be debilitated and referred for observation.  History obtained from patient as well as 2 sisters at bedside. Patient lives alone and up to about 3 weeks ago was functioning independently. However in the last several weeks she has become progressively generally weak. Her sister has been living with her and has had to perform more and more assistance with ADLs. The patient has had several falls in the last week including one several days ago resulting in left arm fracture. Overall her oral intakes been fair. In addition to generalized weakness, difficulty getting up and moving sister has also noticed intermittent slurred speech ongoing for the last 2 weeks. No other focal deficits noted. Family does not feel the patient can be managed at home anymore and have been seeking the possibility of rehabilitation with PCP assistance. However because of increasing generalized weakness and inability to assist with her care as well as her left arm fracture and slurred speech she was brought to the hospital.  In the emergency department she was noted to have a heart rate up into the 130s. Afebrile with otherwise stable vital signs. No hypoxia or tachypnea. Her blood sugar was 249. Creatinine stable consistent with known chronic kidney disease stage IV. Troponin was unremarkable. EKG not acute. Urinalysis was unremarkable. CT of the head and chest x-ray were  unremarkable.   Review of Systems:  Negative for fever, visual changes, sore throat, new muscle aches, chest pain, SOB, dysuria, bleeding, n/v/abdominal pain.  Past Medical History  Diagnosis Date  . Diabetes mellitus   . Hypertension   . Hyperlipemia   . Fibromyalgia   . Obesity   . Sleep apnea     No CPAP  . Anxiety   . Hiatal hernia   . NSTEMI (non-ST elevated myocardial infarction) 04/27/2011  . Anemia 04/27/2011  . Restless leg syndrome   . CAD (coronary artery disease)     small dominant right coronary artery with diffuse 95% stenosis. The LAD had 70% stenosis in the mid vessel beyond the diagonal branch but was a small vessel. Circumflex had proximal 30% stenosis.   Marland Kitchen External hemorrhoids   . Arrhythmia 10/11/2011  . Community acquired pneumonia 06/24/2012  . Renal insufficiency 05/06/2011  . Shingles     Past Surgical History  Procedure Laterality Date  . Appendectomy    . Fracture surgery      left humerous  . Ganglion cyst removal      Social History:  reports that she has never smoked. She has never used smokeless tobacco. She reports that she does not drink alcohol or use illicit drugs.  Allergies  Allergen Reactions  . Codeine Other (See Comments)    Childhood allergy    Family History  Problem Relation Age of Onset  . Stomach cancer Mother   . Heart disease Maternal Grandmother   . Heart disease Maternal Grandfather      Prior to Admission medications   Medication Sig Start Date End Date Taking? Authorizing Provider  carvedilol (COREG) 6.25 MG tablet Take  6.25 mg by mouth 2 (two) times daily with a meal.   Yes Historical Provider, MD  Cyanocobalamin 500 MCG SUBL Place 1 tablet under the tongue 2 (two) times daily.   Yes Historical Provider, MD  ferrous sulfate 325 (65 FE) MG tablet Take 325 mg by mouth daily with breakfast.   Yes Historical Provider, MD  glipiZIDE (GLUCOTROL) 10 MG tablet Take 10 mg by mouth daily before breakfast.   Yes Historical  Provider, MD  insulin aspart (NOVOLOG FLEXPEN) 100 UNIT/ML SOPN FlexPen Inject 6 Units into the skin 3 (three) times daily with meals.   Yes Historical Provider, MD  insulin glargine (LANTUS) 100 UNIT/ML injection Inject 45 Units into the skin daily.    Yes Historical Provider, MD  isosorbide mononitrate (IMDUR) 30 MG 24 hr tablet Take 30 mg by mouth daily.   Yes Historical Provider, MD  pregabalin (LYRICA) 50 MG capsule Take 100 mg by mouth every evening.    Yes Historical Provider, MD  rosuvastatin (CRESTOR) 20 MG tablet Take 20 mg by mouth at bedtime.     Yes Historical Provider, MD  torsemide (DEMADEX) 20 MG tablet Take 2 tablets (40 mg total) by mouth daily. 11/05/12  Yes Lesle Chris Black, NP  valACYclovir (VALTREX) 1000 MG tablet Take 1 tablet (1,000 mg total) by mouth 3 (three) times daily. 04/09/13 04/23/13 Yes Charles B. Bernette Mayers, MD  Vitamin D, Ergocalciferol, (DRISDOL) 50000 UNITS CAPS capsule Take 50,000 Units by mouth every Thursday.   Yes Historical Provider, MD  nitroGLYCERIN (NITROSTAT) 0.4 MG SL tablet Place 1 mg under the tongue every 5 (five) minutes x 3 doses as needed for chest pain. For chest pain    Historical Provider, MD   Physical Exam: Filed Vitals:   04/17/13 1208 04/17/13 1435 04/17/13 1603 04/17/13 1656  BP: 145/117 126/93 143/108 145/107  Pulse: 53 125 110 105  Temp: 98 F (36.7 C)     TempSrc: Oral     Resp: 18 19 19 21   SpO2: 96% 94% 96% 96%    General: Examined in the emergency department. Appears debilitated. Nontoxic. Eyes: PERRL, normal lids, irises. Wears glasses. ENT: grossly normal hearing, lips & tongue  Neck: no LAD, masses or thyromegaly Cardiovascular: Irregular, tachycardic,, no m/r/g. No LE edema. Respiratory: CTA bilaterally, no w/r/r. Normal respiratory effort. Abdomen: soft, ntnd Skin: Healing erythematous rash over the right side of the face and scalp, no vesicles. Well crusted. Musculoskeletal: grossly normal tone BUE/BLE. Moves all  extremities to command. Grossly diminished but intact strength in all extremities. Psychiatric: Grossly normal mood and affect. Speech is appropriate. There are occasional episodes of some carpal or slurring of speech, however predominantly her speech was clear. Neurologic: Cranial nerves appear grossly intact.  Wt Readings from Last 3 Encounters:  04/13/13 106.142 kg (234 lb)  04/11/13 107.502 kg (237 lb)  04/09/13 107.956 kg (238 lb)    Labs on Admission:  Basic Metabolic Panel:  Recent Labs Lab 04/11/13 1815 04/11/13 1935 04/13/13 1325 04/17/13 1424  NA PATIENT IDENTIFICATION ERROR. PLEASE DISREGARD RESULTS. ACCOUNT WILL BE CREDITED. 131* 136 136  K PATIENT IDENTIFICATION ERROR. PLEASE DISREGARD RESULTS. ACCOUNT WILL BE CREDITED. 3.3* 4.2 3.5  CL PATIENT IDENTIFICATION ERROR. PLEASE DISREGARD RESULTS. ACCOUNT WILL BE CREDITED. 97 103 98  CO2 PATIENT IDENTIFICATION ERROR. PLEASE DISREGARD RESULTS. ACCOUNT WILL BE CREDITED. 22 20 25   GLUCOSE PATIENT IDENTIFICATION ERROR. PLEASE DISREGARD RESULTS. ACCOUNT WILL BE CREDITED. 510* 429* 249*  BUN PATIENT IDENTIFICATION ERROR. PLEASE DISREGARD RESULTS. ACCOUNT  WILL BE CREDITED. 45* 41* 42*  CREATININE PATIENT IDENTIFICATION ERROR. PLEASE DISREGARD RESULTS. ACCOUNT WILL BE CREDITED. 2.15* 2.32* 2.50*  CALCIUM PATIENT IDENTIFICATION ERROR. PLEASE DISREGARD RESULTS. ACCOUNT WILL BE CREDITED. 8.9 8.3* 9.5    Liver Function Tests:  Recent Labs Lab 04/11/13 1815 04/17/13 1424  AST PATIENT IDENTIFICATION ERROR. PLEASE DISREGARD RESULTS. ACCOUNT WILL BE CREDITED. 27  ALT PATIENT IDENTIFICATION ERROR. PLEASE DISREGARD RESULTS. ACCOUNT WILL BE CREDITED. 16  ALKPHOS PATIENT IDENTIFICATION ERROR. PLEASE DISREGARD RESULTS. ACCOUNT WILL BE CREDITED. 87  BILITOT PATIENT IDENTIFICATION ERROR. PLEASE DISREGARD RESULTS. ACCOUNT WILL BE CREDITED. 0.4  PROT PATIENT IDENTIFICATION ERROR. PLEASE DISREGARD RESULTS. ACCOUNT WILL BE CREDITED. 7.7  ALBUMIN  PATIENT IDENTIFICATION ERROR. PLEASE DISREGARD RESULTS. ACCOUNT WILL BE CREDITED. 2.9*   No results found for this basename: LIPASE, AMYLASE,  in the last 168 hours No results found for this basename: AMMONIA,  in the last 168 hours  CBC:  Recent Labs Lab 04/11/13 1809 04/11/13 1815 04/13/13 1325 04/17/13 1424  WBC 6.7 PATIENT IDENTIFICATION ERROR. PLEASE DISREGARD RESULTS. ACCOUNT WILL BE CREDITED. 6.3 11.5*  NEUTROABS 3.2 PATIENT IDENTIFICATION ERROR. PLEASE DISREGARD RESULTS. ACCOUNT WILL BE CREDITED. 3.3 7.7  HGB 10.6* PATIENT IDENTIFICATION ERROR. PLEASE DISREGARD RESULTS. ACCOUNT WILL BE CREDITED. 10.5* 12.8  HCT 32.8* PATIENT IDENTIFICATION ERROR. PLEASE DISREGARD RESULTS. ACCOUNT WILL BE CREDITED. 32.0* 38.5  MCV 88.4 PATIENT IDENTIFICATION ERROR. PLEASE DISREGARD RESULTS. ACCOUNT WILL BE CREDITED. 87.2 86.9  PLT 145* PATIENT IDENTIFICATION ERROR. PLEASE DISREGARD RESULTS. ACCOUNT WILL BE CREDITED. 126* 155    Cardiac Enzymes:  Recent Labs Lab 04/11/13 1849 04/17/13 1424  TROPONINI <0.30 <0.30     Recent Labs  06/24/12 1740 10/06/12 2057 04/13/13 1325  PROBNP 4638.0* 6749.0* 4581.0*    CBG:  Recent Labs Lab 04/11/13 2232 04/11/13 2301 04/11/13 2341 04/13/13 1325 04/13/13 1802  GLUCAP 427* 356* 211* 385* 310*     Radiological Exams on Admission: Dg Chest 1 View  04/17/2013   CLINICAL DATA:  Shortness of breath.  EXAM: CHEST - 1 VIEW  COMPARISON:  04/13/2013.  FINDINGS: Mediastinum and hilar structures are normal. Lungs are clear. Heart size stable with borderline cardiomegaly. No CHF. No acute bony abnormality. Degenerative changes thoracic spinal scoliosis. No pneumothorax.  IMPRESSION: 1. Cardiomegaly, no CHF. 2. No acute cardiopulmonary disease.   Electronically Signed   By: Maisie Fus  Register   On: 04/17/2013 14:11   Ct Head Wo Contrast  04/17/2013   CLINICAL DATA:  72 year old female with headache. Facial shingles. Vertigo. Hypertension. Initial  encounter.  EXAM: CT HEAD WITHOUT CONTRAST  TECHNIQUE: Contiguous axial images were obtained from the base of the skull through the vertex without intravenous contrast.  COMPARISON:  04/13/2013 and earlier.  FINDINGS: Visualized orbits and scalp soft tissues are within normal limits. Visualized paranasal sinuses and mastoids are clear. No acute osseous abnormality identified. Calcified atherosclerosis at the skull base.  Stable and normal for age cerebral volume. No ventriculomegaly. Patchy bilateral white matter hypodensity is stable. Mild heterogeneity of the superior left thalamus is stable. No midline shift, mass effect, or evidence of intracranial mass lesion. No acute intracranial hemorrhage identified. No evidence of cortically based acute infarction identified. No suspicious intracranial vascular hyperdensity.  IMPRESSION: No acute intracranial abnormality. Stable white matter changes, favor chronic small vessel disease.   Electronically Signed   By: Augusto Gamble M.D.   On: 04/17/2013 14:23    EKG: Independently reviewed. Atrial fibrillation with rapid ventricular response. Right bundle branch block. Compared to previous  study 04/13/2013, right bundle branch block is old. No acute changes.   Principal Problem:   Atrial fibrillation with RVR Active Problems:   CKD (chronic kidney disease), stage IV   Morbid obesity   DM type 2, uncontrolled, with neuropathy   Slurred speech   Fall at home   Restless leg   Shingles   Assessment/Plan 1. Atrial fibrillation with rapid ventricular response: Etiology unclear, question noncompliance. Rate improved with IV beta blocker. Will increase dose of beta blocker. 2. Questionable slurred speech: This seems intermittent during examination. She has no other focal deficits. Cannot exclude subacute stroke sometime in the last week but I do not think this very likely. CT head negative. Will check MRI to further assess. Will not pursue stroke evaluation unless  positive. 3. Recurrent falls: Likely secondary to recurrent debility. Physical therapy evaluation. Possibly contributed to by atrial fibrillation with rapid rate. 4. Recent arm fracture: Appears stable. Followup as an outpatient. 5. Diabetes mellitus with hyperglycemia: Currently fair control. Sliding-scale insulin. Meal coverage. Lantus. Hold glipizide. 6. Generalized weakness 7. Chronic kidney disease stage IV: Appears stable. 8. Recent diagnosis of shingles: Appears to be healing well. 9. History of coronary artery disease 10. Obstructive sleep apnea, noncompliant with CPAP 11. Restless leg syndrome  Discussed results of testing with 2 sisters at bedside. Her kidney function appears to be at baseline and hyperglycemia is modest. Think she would benefit from overnight evaluation for atrial fibrillation with rapid ventricular response, physical therapy evaluation, other plans as above. We discussed the possibilities of returning home versus skilled nursing facility for rehabilitation if she qualifies. Family understands she may not qualify.  Code Status: Full code  DVT prophylaxis: Lovenox Family Communication: Discussed with 2 sisters at bedside Disposition Plan/Anticipated LOS: observation, 1-2 days  Time spent: 60 minutes  Brendia Sacks, MD  [facility] Pager [phone] 04/17/2013, 5:21 PM

## 2013-04-17 NOTE — ED Provider Notes (Signed)
CSN: [id number]     Arrival date & time 04/17/13  1208 History  This chart was scribed for Roney Marion, MD by Quintella Reichert, ED scribe.  This patient was seen in room APA07/APA07 and the patient's care was started at 1:09 PM.   Chief complaint: Evaluation for nursing home placement   The history is provided by the patient and a relative. No language interpreter was used.    HPI Comments: [name] is a 72 y.o. female who presents to the Emergency Department for evaluation for nursing home placement.  Pt has multiple medical conditions including poorly-controlled DM, HTN, hyperlipemia, obesity, CAD, arrythmia, renal insufficiency, h/o NSTEMI, and recently-diagnosed shingles.  Sister feels that pt's condition has been generally deteriorating over the past week and states "she can't do anything, I'm having to do everything for her."  She feels that pt needs to be placed in a nursing home.  In the past 8 days she has had recurrent falls and persistent hyperglycemia up to 600, as well as a painful red rash on her face.  She has been seen in the ED 3 times in that period and at her last visit (11/9) was diagnosed with a distal left radius fracture.  She was placed in a splint and told to wear it for 10 days and f/u to determine if she needed a cast.  3 days ago pt was seen by her PCP who took her off of 2 medications.  She is not certain what medications these were or why she was taken off.  PCP also changed her insulin dosage.  Pt also states she has had progressively-worsening fatigue and tremors over the past week.  She states her legs feel shaky when she stands up.  Sister also notes that pt's speech has been worsening over the past week. Pt states that it is difficult to form words. She states "when I take my pills I feel like I'm going to go to sleep" but she cannot specify any specific medication that does this.  She denies recent added medications.  In addition pt normally walks with a walker but  states she has not been able to since breaking her wrist.   She was diagnosed with shingles 8 days ago and states that her facial rash still hurts.  Her blood sugar today was ranging from 241-266 although it was over 600 last night.  Pt also complains of SOB on even slight exertion including trying.  She states her back also began hurting today.  She is eating less than usual and states "it's hard to have a bowel movement" for the past month.  She had a small BM this morning.  She denies new CP, vomiting, diarrhea, or abdominal pain.  Sister has been staying with her for the past several week but she lives in [place] and states she cannot be her caregiver indefinitely.      PCP is Dr. Lysbeth Galas   Past Medical History  Diagnosis Date  . Diabetes mellitus   . Hypertension   . Hyperlipemia   . Fibromyalgia   . Obesity   . Sleep apnea     No CPAP  . Anxiety   . Hiatal hernia   . NSTEMI (non-ST elevated myocardial infarction) 04/27/2011  . Anemia 04/27/2011  . Restless leg syndrome   . CAD (coronary artery disease)     small dominant right coronary artery with diffuse 95% stenosis. The LAD had 70% stenosis in the mid vessel beyond  the diagonal branch but was a small vessel. Circumflex had proximal 30% stenosis.   Marland Kitchen External hemorrhoids   . Arrhythmia 10/11/2011  . Community acquired pneumonia 06/24/2012  . Renal insufficiency 05/06/2011  . Shingles     Past Surgical History  Procedure Laterality Date  . Appendectomy    . Fracture surgery      left humerous  . Ganglion cyst removal      Family History  Problem Relation Age of Onset  . Stomach cancer Mother   . Heart disease Maternal Grandmother   . Heart disease Maternal Grandfather     History  Substance Use Topics  . Smoking status: Never Smoker   . Smokeless tobacco: Never Used  . Alcohol Use: No    OB History   Grav Para Term Preterm Abortions TAB SAB Ect Mult Living                  Review of Systems   Constitutional: Positive for appetite change and fatigue. Negative for fever, chills and diaphoresis.  HENT: Negative for mouth sores, sore throat and trouble swallowing.   Eyes: Negative for visual disturbance.  Respiratory: Positive for shortness of breath. Negative for cough, chest tightness and wheezing.   Cardiovascular: Negative for chest pain.  Gastrointestinal: Positive for constipation. Negative for nausea, vomiting, abdominal pain, diarrhea and abdominal distention.  Endocrine: Negative for polydipsia, polyphagia and polyuria.  Genitourinary: Negative for dysuria, frequency and hematuria.  Musculoskeletal: Positive for arthralgias, back pain and gait problem.  Skin: Positive for rash. Negative for color change and pallor.  Neurological: Positive for speech difficulty and weakness. Negative for dizziness, syncope, light-headedness and headaches.  Hematological: Does not bruise/bleed easily.  Psychiatric/Behavioral: Negative for behavioral problems and confusion.     Allergies  Codeine  Home Medications   Current Outpatient Rx  Name  Route  Sig  Dispense  Refill  . carvedilol (COREG) 6.25 MG tablet   Oral   Take 6.25 mg by mouth 2 (two) times daily with a meal.         . Cyanocobalamin 500 MCG SUBL   Sublingual   Place 1 tablet under the tongue 2 (two) times daily.         . ferrous sulfate 325 (65 FE) MG tablet   Oral   Take 325 mg by mouth daily with breakfast.         . fluocinonide cream (LIDEX) 0.05 %   Topical   Apply 1 application topically 2 (two) times daily as needed. Sore on leg         . glimepiride (AMARYL) 4 MG tablet   Oral   Take 4 mg by mouth 2 (two) times daily.         Marland Kitchen glipiZIDE (GLUCOTROL) 5 MG tablet   Oral   Take 2.5 mg by mouth daily before breakfast.         . insulin glargine (LANTUS) 100 UNIT/ML injection   Subcutaneous   Inject 25 Units into the skin 2 (two) times daily.         . isosorbide mononitrate (IMDUR) 30  MG 24 hr tablet   Oral   Take 30 mg by mouth daily.         . Liraglutide (VICTOZA) 18 MG/3ML SOPN   Subcutaneous   Inject 18 mg into the skin daily.         . nitroGLYCERIN (NITROSTAT) 0.4 MG SL tablet   Sublingual   Place 1  mg under the tongue every 5 (five) minutes x 3 doses as needed for chest pain. For chest pain         . pantoprazole (PROTONIX) 40 MG tablet   Oral   Take 40 mg by mouth daily.         . pregabalin (LYRICA) 50 MG capsule   Oral   Take 100 mg by mouth every evening.          . rosuvastatin (CRESTOR) 20 MG tablet   Oral   Take 20 mg by mouth at bedtime.           . torsemide (DEMADEX) 20 MG tablet   Oral   Take 2 tablets (40 mg total) by mouth daily.   60 tablet   0   . valACYclovir (VALTREX) 1000 MG tablet   Oral   Take 1 tablet (1,000 mg total) by mouth 3 (three) times daily.   30 tablet   0   . Vitamin D, Ergocalciferol, (DRISDOL) 50000 UNITS CAPS capsule   Oral   Take 50,000 Units by mouth every Thursday.          BP 145/117  Pulse 53  Temp(Src) 98 F (36.7 C) (Oral)  Resp 18  SpO2 96%  Physical Exam  Nursing note and vitals reviewed. Constitutional: She is oriented to person, place, and time. No distress.  Appears weak,  Struggles to maintain conversation.  HENT:  Head: Normocephalic.  Mouth/Throat: Mucous membranes are dry.  Eyes: Conjunctivae are normal. Pupils are equal, round, and reactive to light. No scleral icterus.  Neck: Normal range of motion. Neck supple. Carotid bruit is not present. No thyromegaly present.  Cardiovascular: Normal rate and intact distal pulses.  An irregular rhythm present. Exam reveals no gallop and no friction rub.   No murmur heard. Pulmonary/Chest: Effort normal and breath sounds normal. No respiratory distress. She has no wheezes. She has no rales.  Abdominal: Soft. Bowel sounds are normal. She exhibits no distension. There is no tenderness. There is no rebound.  Musculoskeletal:  Normal range of motion.  No swelling or edema  LUE in coaptation splint.  Good CRF and feeling to fingers.  Neurological: She is alert and oriented to person, place, and time.  Slurred speech.  No facial droop.  Not aphasic.  RUE without pronator drift.  LUE in coaptation splint.  Can raise each foot from bed, not cannot maintain for more than 1-2 seconds.  Skin: Skin is warm and dry. Rash noted.  Red macular rash on right upper face  Psychiatric: She has a normal mood and affect. Her behavior is normal.    ED Course  Procedures (including critical care time)  DIAGNOSTIC STUDIES: Oxygen Saturation is 96% on room air, normal by my interpretation.    COORDINATION OF CARE: 1:20 PM: Discussed treatment plan which includes Lopressor, labs and imaging.  Pt and family expressed understanding and agreed to plan.   Labs Review Labs Reviewed  CBC WITH DIFFERENTIAL - Abnormal; Notable for the following:    WBC 11.5 (*)    All other components within normal limits  COMPREHENSIVE METABOLIC PANEL - Abnormal; Notable for the following:    Glucose, Bld 249 (*)    BUN 42 (*)    Creatinine, Ser 2.50 (*)    Albumin 2.9 (*)    GFR calc non Af Amer 18 (*)    GFR calc Af Amer 21 (*)    All other components within normal limits  URINALYSIS,  ROUTINE W REFLEX MICROSCOPIC - Abnormal; Notable for the following:    Color, Urine STRAW (*)    Glucose, UA 250 (*)    Hgb urine dipstick SMALL (*)    Protein, ur 100 (*)    All other components within normal limits  URINE CULTURE  TROPONIN I  PROTIME-INR  URINE MICROSCOPIC-ADD ON  TSH    Imaging Review Dg Chest 1 View  04/17/2013   CLINICAL DATA:  Shortness of breath.  EXAM: CHEST - 1 VIEW  COMPARISON:  04/13/2013.  FINDINGS: Mediastinum and hilar structures are normal. Lungs are clear. Heart size stable with borderline cardiomegaly. No CHF. No acute bony abnormality. Degenerative changes thoracic spinal scoliosis. No pneumothorax.  IMPRESSION: 1.  Cardiomegaly, no CHF. 2. No acute cardiopulmonary disease.   Electronically Signed   By: Maisie Fus  Register   On: 04/17/2013 14:11   Ct Head Wo Contrast  04/17/2013   CLINICAL DATA:  72 year old female with headache. Facial shingles. Vertigo. Hypertension. Initial encounter.  EXAM: CT HEAD WITHOUT CONTRAST  TECHNIQUE: Contiguous axial images were obtained from the base of the skull through the vertex without intravenous contrast.  COMPARISON:  04/13/2013 and earlier.  FINDINGS: Visualized orbits and scalp soft tissues are within normal limits. Visualized paranasal sinuses and mastoids are clear. No acute osseous abnormality identified. Calcified atherosclerosis at the skull base.  Stable and normal for age cerebral volume. No ventriculomegaly. Patchy bilateral white matter hypodensity is stable. Mild heterogeneity of the superior left thalamus is stable. No midline shift, mass effect, or evidence of intracranial mass lesion. No acute intracranial hemorrhage identified. No evidence of cortically based acute infarction identified. No suspicious intracranial vascular hyperdensity.  IMPRESSION: No acute intracranial abnormality. Stable white matter changes, favor chronic small vessel disease.   Electronically Signed   By: Augusto Gamble M.D.   On: 04/17/2013 14:23    EKG Interpretation     Ventricular Rate:  138 PR Interval:    QRS Duration: 124 QT Interval:  346 QTC Calculation: 524 R Axis:   33 Text Interpretation:  Atrial fibrillation with rapid ventricular response with premature ventricular or aberrantly conducted complexes Right bundle branch block Abnormal ECG When compared with ECG of  Ventricular rate/response has increased            MDM   1. Atrial fibrillation   2. Weakness   3. Wrist fracture, left, with routine healing, subsequent encounter   4. Multiple falls      The patient is globally weak. She normally walks with a walker. She cannot use her walker because of her arm  fracture. She has fallen again since being discharged most recently 4 days ago. She is in rapid A. fib and is not anticoagulated. She has no slurring of her speech. CT does not show acute infarct. Does not show hemorrhage.  I think the weakness is very likely due to rapid A. fib. Her rate is better after IV metoprolol here. I feel she is at risk to be at home in her current condition. She'll be followed once and fractured her wrist. She is followed again trying to coordinate using her walker with help at home. Feel she needs inpatient evaluation with physical therapy and occupational therapy. She'll need ongoing control of her atrial fibrillation. She does not appear to be in congestive heart failure.  I personally performed the services described in this documentation, which was scribed in my presence. The recorded information has been reviewed and is accurate.  Roney Marion, MD 04/17/13 [phone]

## 2013-04-18 DIAGNOSIS — N184 Chronic kidney disease, stage 4 (severe): Secondary | ICD-10-CM

## 2013-04-18 LAB — BASIC METABOLIC PANEL
BUN: 44 mg/dL — ABNORMAL HIGH (ref 6–23)
Chloride: 99 mEq/L (ref 96–112)
Creatinine, Ser: 2.56 mg/dL — ABNORMAL HIGH (ref 0.50–1.10)
GFR calc Af Amer: 21 mL/min — ABNORMAL LOW (ref >90)
Potassium: 3.8 mEq/L (ref 3.5–5.1)
Sodium: 136 mEq/L (ref 135–145)

## 2013-04-18 LAB — GLUCOSE, CAPILLARY
Glucose-Capillary: 294 mg/dL — ABNORMAL HIGH (ref 70–99)
Glucose-Capillary: 253 mg/dL — ABNORMAL HIGH (ref 70–99)

## 2013-04-18 LAB — URINE CULTURE: Culture: NO GROWTH

## 2013-04-18 LAB — TSH: TSH: 1.643 u[IU]/mL (ref 0.350–4.500)

## 2013-04-18 LAB — CBC
HCT: 34.1 % — ABNORMAL LOW (ref 36.0–46.0)
MCHC: 32.8 g/dL (ref 30.0–36.0)
RBC: 3.9 MIL/uL (ref 3.87–5.11)
RDW: 14.4 % (ref 11.5–15.5)
WBC: 12.4 10*3/uL — ABNORMAL HIGH (ref 4.0–10.5)

## 2013-04-18 MED ORDER — NITROGLYCERIN 0.4 MG SL SUBL: 0.4000 mg | SUBLINGUAL_TABLET | SUBLINGUAL | Status: DC | PRN

## 2013-04-18 MED ORDER — INSULIN ASPART 100 UNIT/ML ~~LOC~~ SOLN
0.0000 [IU] | Freq: Three times a day (TID) | SUBCUTANEOUS | Status: DC
  Administered 2013-04-18: 8 [IU] via SUBCUTANEOUS
  Administered 2013-04-18: 5 [IU] via SUBCUTANEOUS
  Administered 2013-04-18: 8 [IU] via SUBCUTANEOUS

## 2013-04-18 MED ORDER — INSULIN ASPART 100 UNIT/ML ~~LOC~~ SOLN: 0.0000 [IU] | Freq: Every day | SUBCUTANEOUS | Status: DC

## 2013-04-18 MED ORDER — CARVEDILOL 12.5 MG PO TABS: 12.5000 mg | ORAL_TABLET | Freq: Two times a day (BID) | ORAL | Status: DC

## 2013-04-18 MED ORDER — NYSTATIN 100000 UNIT/GM EX POWD: 1.0000 g | Freq: Two times a day (BID) | CUTANEOUS | Status: DC

## 2013-04-18 MED ORDER — NYSTATIN 100000 UNIT/GM EX POWD
Freq: Two times a day (BID) | CUTANEOUS | Status: DC
  Administered 2013-04-18: 12:00:00 via TOPICAL
  Filled 2013-04-18: qty 15

## 2013-04-18 NOTE — Discharge Summary (Addendum)
Patient seen, independently examined and chart reviewed. I agree with exam, assessment and plan discussed with Toya Smothers, NP.  No new issues overnight. She feels okay. Eating okay. No pain, nausea, vomiting. She was evaluated by occupational therapy and rehabilitation was recommended.  She remains afebrile with stable vital signs. Overall heart rate has remained controlled in the 80s to low 100s. She remains asymptomatic. No hypoxia or tachypnea.  She appears calm and comfortable on examination. Heart is irregular with normal rate. Lungs are clear to auscultation bilaterally with no wheezes, rales or rhonchi. Normal respiratory effort. No significant lower extremity edema. Abdomen is soft, nontender and nondistended. Rash on the right side of her face is healing well. There are no vesicles or wet areas. She moves all extremities well. Her speech is fully clear without any slurring. Neurologic exam is nonfocal.  Overall blood sugars have been modestly elevated in the 200s. Kidney function is stable at 2.56. Review of her record is notable for baseline creatinine apparently from approximately 2-3. Modest leukocytosis without evidence of infection. TSH normal. MRI of the brain--no acute intracranial infarct or other process identified. Moderate atrophy and chronic microvascular ischemic disease seen.  In summary atrial fibrillation with adequate control, asymptomatic. Slurred speech is of unclear etiology, resolved, intermittent over the last 2 weeks. Doubt of clinical significance. No further evaluation is suggested. She is debilitated and would benefit from skilled nursing facility. She will followup as an outpatient for her fracture. Chronic comorbidities remain stable. I do not think further care in the hospital would be of benefit.  Brendia Sacks, MD [facility] [phone]

## 2013-04-18 NOTE — Progress Notes (Signed)
Patient report called to [facility]. Patient transported via EMS to [facility] in stable condition.

## 2013-04-18 NOTE — Discharge Summary (Signed)
Physician Discharge Summary  [name]:[id number] DOB: Jun 30, 1940 DOA: 04/17/2013  PCP: Josue Hector, MD  Admit date: 04/17/2013 Discharge date: 04/18/2013  Time spent: 40 minutes  Recommendations for Outpatient Follow-up:  1. Follow up with PCP in 1 week for evaluation of symptoms 2. Follow up with ortho as scheduled  Discharge Diagnoses:  Principal Problem:   Atrial fibrillation with RVR Active Problems:   CKD (chronic kidney disease), stage IV   Morbid obesity   DM type 2, uncontrolled, with neuropathy   Slurred speech   Fall at home   Restless leg   Shingles   Discharge Condition: stable  Diet recommendation: carb modified  Filed Weights   04/17/13 1744  Weight: 108.1 kg (238 lb 5.1 oz)    History of present illness:  72 year old woman with multiple medical problems presented to the emergency department on 04/17/13 with her sister with progressive generalized weakness, intermittent slurred speech, recurrent falls and fluctuating hyperglycemia into the 600s. Initial evaluation was notable for atrial fibrillation with rapid ventricular response and possibly intermittently slurred speech. She was noted to be debilitated and referred for observation. History obtained from patient as well as 2 sisters at bedside. Patient lives alone and up to about 3 weeks ago was functioning independently. However in the last several weeks she has become progressively generally weak. Her sister has been living with her and has had to perform more and more assistance with ADLs. The patient has had several falls in the last week including one several days ago resulting in left arm fracture. Overall her oral intake had been fair. In addition to generalized weakness, difficulty getting up and moving sister has also noticed intermittent slurred speech ongoing for the last 2 weeks. No other focal deficits noted. Family did not feel the patient could be managed at home anymore and have been  seeking the possibility of rehabilitation with PCP assistance. However because of increasing generalized weakness and inability to assist with her care as well as her left arm fracture and slurred speech she was brought to the hospital. In the emergency department she was noted to have a heart rate up into the 130s. Afebrile with otherwise stable vital signs. No hypoxia or tachypnea. Her blood sugar was 249. Creatinine stable consistent with known chronic kidney disease stage IV. Troponin was unremarkable. EKG not acute. Urinalysis was unremarkable. CT of the head and chest x-ray were unremarkable.   Hospital Course:  1. Atrial fibrillation with rapid ventricular response: Concern for  noncompliance. Rate improved with IV beta blocker in ED. Home dose of  beta blocker increased. At discharge HR range 81-96. Will continue BB at new dosage. 2. Questionable slurred speech: This seems intermittent during examination. She has no other focal deficits. CT head negative. MRI with no acute infarct. No need for further stroke evaluation.Marland Kitchen Speech clear at discharge.  3. Recurrent falls: Likely secondary to recurrent debility. Physical therapy evaluation recommending snf. Possibly contributed to by atrial fibrillation with rapid rate. Discharge to snf when bed available 4. Recent arm fracture: Cast intact. Stable. Followup as an outpatient. 5. Diabetes mellitus with hyperglycemia:  Currently fair control. HgA1c 7.5 5/14. At discharge will resume home regimen of lantus, meal coverage and glipizide. 6. Generalized weakness: PT evaluation recommending snf.  7. Chronic kidney disease stage IV: remained  stable. 8. Recent diagnosis of shingles: Appears to be healing well. 9. History of coronary artery disease 10. Obstructive sleep apnea, noncompliant with CPAP 11. Restless leg syndrome 12. Rash:  may be yeast in skin fold of lower abdomen. Will provide  nystatin   Procedures:  none  Consultations:  none  Discharge Exam: Filed Vitals:   04/18/13 0454  BP: 134/95  Pulse: 81  Temp: 98.4 F (36.9 C)  Resp: 20    General: obese NAD Cardiovascular: irregularly irregular no MGR No LE edema Respiratory: normal effort BS clear bilaterally no wheeze or rhonchi Skin: red rash lower abdominal skin fold with odor, tender to touch.   Discharge Instructions     Medication List         carvedilol 12.5 MG tablet  Commonly known as:  COREG  Take 1 tablet (12.5 mg total) by mouth 2 (two) times daily with a meal.     Cyanocobalamin 500 MCG Subl  Place 1 tablet under the tongue 2 (two) times daily.     ferrous sulfate 325 (65 FE) MG tablet  Take 325 mg by mouth daily with breakfast.     glipiZIDE 10 MG tablet  Commonly known as:  GLUCOTROL  Take 10 mg by mouth daily before breakfast.     insulin glargine 100 UNIT/ML injection  Commonly known as:  LANTUS  Inject 45 Units into the skin daily.     isosorbide mononitrate 30 MG 24 hr tablet  Commonly known as:  IMDUR  Take 30 mg by mouth daily.     nitroGLYCERIN 0.4 MG SL tablet  Commonly known as:  NITROSTAT  Place 1 mg under the tongue every 5 (five) minutes x 3 doses as needed for chest pain. For chest pain     NOVOLOG FLEXPEN 100 UNIT/ML Sopn FlexPen  Generic drug:  insulin aspart  Inject 6 Units into the skin 3 (three) times daily with meals.     nystatin 100000 UNIT/GM Powd  Apply 1 g topically 2 (two) times daily.     pregabalin 50 MG capsule  Commonly known as:  LYRICA  Take 100 mg by mouth every evening.     rosuvastatin 20 MG tablet  Commonly known as:  CRESTOR  Take 20 mg by mouth at bedtime.     torsemide 20 MG tablet  Commonly known as:  DEMADEX  Take 2 tablets (40 mg total) by mouth daily.     valACYclovir 1000 MG tablet  Commonly known as:  VALTREX  Take 1 tablet (1,000 mg total) by mouth 3 (three) times daily.     Vitamin D (Ergocalciferol)  50000 UNITS Caps capsule  Commonly known as:  DRISDOL  Take 50,000 Units by mouth every Thursday.       Allergies  Allergen Reactions  . Codeine Other (See Comments)    Childhood allergy   Follow-up Information   Follow up with Josue Hector, MD. Schedule an appointment as soon as possible for a visit in 1 week. (for evaluation of symptoms)    Specialty:  Family Medicine   Contact information:   [address] [place] [address] [phone]        The results of significant diagnostics from this hospitalization (including imaging, microbiology, ancillary and laboratory) are listed below for reference.    Significant Diagnostic Studies: Dg Chest 1 View  04/17/2013   CLINICAL DATA:  Shortness of breath.  EXAM: CHEST - 1 VIEW  COMPARISON:  04/13/2013.  FINDINGS: Mediastinum and hilar structures are normal. Lungs are clear. Heart size stable with borderline cardiomegaly. No CHF. No acute bony abnormality. Degenerative changes thoracic spinal scoliosis. No pneumothorax.  IMPRESSION: 1. Cardiomegaly, no  CHF. 2. No acute cardiopulmonary disease.   Electronically Signed   By: Maisie Fus  Register   On: 04/17/2013 14:11   Dg Chest 2 View  04/13/2013   CLINICAL DATA:  Multiple falls. Left chest pain.  EXAM: CHEST  2 VIEW  COMPARISON:  12/17/2012.  FINDINGS: The heart is enlarged but stable. Chronic lung changes but no acute pulmonary findings. No pleural effusion or pneumothorax. The bony thorax is intact.  IMPRESSION: Cardiac enlargement and chronic lung changes but no acute pulmonary findings.  Intact bony thorax.   Electronically Signed   By: Loralie Champagne M.D.   On: 04/13/2013 14:46   Dg Forearm Left  04/13/2013   CLINICAL DATA:  Larey Seat. Forearm pain.  EXAM: LEFT FOREARM - 2 VIEW  COMPARISON:  None.  FINDINGS: There is a distal radius fracture with mild dorsal impaction. Wrist films may be helpful for further evaluation. Small bone anchors are noted in the lunate or triquetrum. No  forearm fractures. The elbow joint is intact. No elbow joint effusion.  IMPRESSION: Distal radius fracture with mild dorsal impaction. Recommend dedicated wrist films.   Electronically Signed   By: Loralie Champagne M.D.   On: 04/13/2013 14:47   Dg Wrist Complete Left  04/13/2013   CLINICAL DATA:  Fall with pain in left wrist  EXAM: LEFT WRIST - COMPLETE 3+ VIEW  COMPARISON:  None.  FINDINGS: There is a fracture through the distal radial metaphysis with minimal displacement and angulation. There is minimal anterior displacement of the distal fracture fragment.  IMPRESSION: Fracture distal radial metaphysis   Electronically Signed   By: Esperanza Heir M.D.   On: 04/13/2013 16:50   Ct Head Wo Contrast  04/17/2013   CLINICAL DATA:  73 year old female with headache. Facial shingles. Vertigo. Hypertension. Initial encounter.  EXAM: CT HEAD WITHOUT CONTRAST  TECHNIQUE: Contiguous axial images were obtained from the base of the skull through the vertex without intravenous contrast.  COMPARISON:  04/13/2013 and earlier.  FINDINGS: Visualized orbits and scalp soft tissues are within normal limits. Visualized paranasal sinuses and mastoids are clear. No acute osseous abnormality identified. Calcified atherosclerosis at the skull base.  Stable and normal for age cerebral volume. No ventriculomegaly. Patchy bilateral white matter hypodensity is stable. Mild heterogeneity of the superior left thalamus is stable. No midline shift, mass effect, or evidence of intracranial mass lesion. No acute intracranial hemorrhage identified. No evidence of cortically based acute infarction identified. No suspicious intracranial vascular hyperdensity.  IMPRESSION: No acute intracranial abnormality. Stable white matter changes, favor chronic small vessel disease.   Electronically Signed   By: Augusto Gamble M.D.   On: 04/17/2013 14:23   Ct Head Wo Contrast  04/13/2013   CLINICAL DATA:  Significant headache  EXAM: CT HEAD WITHOUT CONTRAST   TECHNIQUE: Contiguous axial images were obtained from the base of the skull through the vertex without intravenous contrast.  COMPARISON:  12/17/2012  FINDINGS: No skull fracture is noted. Paranasal sinuses and mastoid air cells are unremarkable.  No intracranial hemorrhage, mass effect or midline shift. No acute cortical infarction. Stable cerebral atrophy and chronic white matter disease. No mass lesion is noted on this unenhanced scan.  IMPRESSION: No acute intracranial abnormality. Stable atrophy and chronic white matter disease.   Electronically Signed   By: Natasha Mead M.D.   On: 04/13/2013 13:58   Mr Brain Wo Contrast  04/17/2013   CLINICAL DATA:  Slurred speech, weakness, falls  EXAM: MRI HEAD WITHOUT CONTRAST  TECHNIQUE: Multiplanar,  multiecho pulse sequences of the brain and surrounding structures were obtained without intravenous contrast.  COMPARISON:  Prior CT from earlier the same day and CT from 11/09/ 14.  FINDINGS: The examination is somewhat limited by motion artifact.  No diffusion-weighted signal abnormality to suggest acute intracranial infarct is identified. There is no intracranial hemorrhage. No extra-axial fluid collection identified.  Diffuse prominence of the CSF containing spaces is compatible with moderate age-related atrophy. Scattered T2/FLAIR hyperintense foci within the periventricular and deep white matter most likely reflect chronic small vessel ischemic changes. Normal intravascular flow voids are seen within the intracranial vasculature.  No midline shift or mass lesion. Ventricles are within normal limits without evidence of hydrocephalus. Globes and optic nerves are grossly normal. The craniocervical junction is within normal limits. Pituitary gland is unremarkable.  IMPRESSION: 1. No acute intracranial infarct or other process identified. 2. Moderate atrophy and chronic microvascular ischemic disease.   Electronically Signed   By: Rise Mu M.D.   On: 04/17/2013  22:29    Microbiology: No results found for this or any previous visit (from the past 240 hour(s)).   Labs: Basic Metabolic Panel:  Recent Labs Lab 04/11/13 1815 04/11/13 1935 04/13/13 1325 04/17/13 1424 04/18/13 0506  NA PATIENT IDENTIFICATION ERROR. PLEASE DISREGARD RESULTS. ACCOUNT WILL BE CREDITED. 131* 136 136 136  K PATIENT IDENTIFICATION ERROR. PLEASE DISREGARD RESULTS. ACCOUNT WILL BE CREDITED. 3.3* 4.2 3.5 3.8  CL PATIENT IDENTIFICATION ERROR. PLEASE DISREGARD RESULTS. ACCOUNT WILL BE CREDITED. 97 103 98 99  CO2 PATIENT IDENTIFICATION ERROR. PLEASE DISREGARD RESULTS. ACCOUNT WILL BE CREDITED. 22 20 25 24   GLUCOSE PATIENT IDENTIFICATION ERROR. PLEASE DISREGARD RESULTS. ACCOUNT WILL BE CREDITED. 510* 429* 249* 329*  BUN PATIENT IDENTIFICATION ERROR. PLEASE DISREGARD RESULTS. ACCOUNT WILL BE CREDITED. 45* 41* 42* 44*  CREATININE PATIENT IDENTIFICATION ERROR. PLEASE DISREGARD RESULTS. ACCOUNT WILL BE CREDITED. 2.15* 2.32* 2.50* 2.56*  CALCIUM PATIENT IDENTIFICATION ERROR. PLEASE DISREGARD RESULTS. ACCOUNT WILL BE CREDITED. 8.9 8.3* 9.5 9.0   Liver Function Tests:  Recent Labs Lab 04/11/13 1815 04/17/13 1424  AST PATIENT IDENTIFICATION ERROR. PLEASE DISREGARD RESULTS. ACCOUNT WILL BE CREDITED. 27  ALT PATIENT IDENTIFICATION ERROR. PLEASE DISREGARD RESULTS. ACCOUNT WILL BE CREDITED. 16  ALKPHOS PATIENT IDENTIFICATION ERROR. PLEASE DISREGARD RESULTS. ACCOUNT WILL BE CREDITED. 87  BILITOT PATIENT IDENTIFICATION ERROR. PLEASE DISREGARD RESULTS. ACCOUNT WILL BE CREDITED. 0.4  PROT PATIENT IDENTIFICATION ERROR. PLEASE DISREGARD RESULTS. ACCOUNT WILL BE CREDITED. 7.7  ALBUMIN PATIENT IDENTIFICATION ERROR. PLEASE DISREGARD RESULTS. ACCOUNT WILL BE CREDITED. 2.9*   No results found for this basename: LIPASE, AMYLASE,  in the last 168 hours No results found for this basename: AMMONIA,  in the last 168 hours CBC:  Recent Labs Lab 04/11/13 1809 04/11/13 1815 04/13/13 1325  04/17/13 1424 04/18/13 0506  WBC 6.7 PATIENT IDENTIFICATION ERROR. PLEASE DISREGARD RESULTS. ACCOUNT WILL BE CREDITED. 6.3 11.5* 12.4*  NEUTROABS 3.2 PATIENT IDENTIFICATION ERROR. PLEASE DISREGARD RESULTS. ACCOUNT WILL BE CREDITED. 3.3 7.7  --   HGB 10.6* PATIENT IDENTIFICATION ERROR. PLEASE DISREGARD RESULTS. ACCOUNT WILL BE CREDITED. 10.5* 12.8 11.2*  HCT 32.8* PATIENT IDENTIFICATION ERROR. PLEASE DISREGARD RESULTS. ACCOUNT WILL BE CREDITED. 32.0* 38.5 34.1*  MCV 88.4 PATIENT IDENTIFICATION ERROR. PLEASE DISREGARD RESULTS. ACCOUNT WILL BE CREDITED. 87.2 86.9 87.4  PLT 145* PATIENT IDENTIFICATION ERROR. PLEASE DISREGARD RESULTS. ACCOUNT WILL BE CREDITED. 126* 155 156   Cardiac Enzymes:  Recent Labs Lab 04/11/13 1849 04/17/13 1424  TROPONINI <0.30 <0.30   BNP: BNP (last 3 results)  Recent Labs  06/24/12 1740 10/06/12 2057 04/13/13 1325  PROBNP 4638.0* 6749.0* 4581.0*   CBG:  Recent Labs Lab 04/11/13 2341 04/13/13 1325 04/13/13 1802 04/17/13 2229 04/18/13 0728  GLUCAP 211* 385* 310* 291* 294*       Signed:  BLACK,KAREN M  [facility] 04/18/2013, 10:40 AM

## 2013-04-18 NOTE — Clinical Social Work Placement (Cosign Needed)
Clinical Social Work Department CLINICAL SOCIAL WORK PLACEMENT NOTE 04/18/2013  Patient:  [name], [name]  Account Number:  [id number] Admit date:  04/17/2013  Clinical Social Worker:  Cameron Ali, BSW Intern  Date/time:  04/18/2013 11:37 AM  Clinical Social Work is seeking post-discharge placement for this patient at the following level of care:   SKILLED NURSING   (*CSW will update this form in Epic as items are completed)   04/18/2013  Patient/family provided with [facility] Department of Clinical Social Work's list of facilities offering this level of care within the geographic area requested by the patient (or if unable, by the patient's family).  04/18/2013  Patient/family informed of their freedom to choose among providers that offer the needed level of care, that participate in Medicare, Medicaid or managed care program needed by the patient, have an available bed and are willing to accept the patient.  04/18/2013  Patient/family informed of [facility]' ownership interest in [facility], as well as of the fact that they are under no obligation to receive care at this facility.  PASARR submitted to [employer] on  PASARR number received from [employer] on   FL2 transmitted to all facilities in geographic area requested by pt/family on  04/18/2013 FL2 transmitted to all facilities within larger geographic area on 04/18/2013  Patient informed that his/her managed care company has contracts with or will negotiate with  certain facilities, including the following:     Patient/family informed of bed offers received:  04/18/2013 Patient chooses bed at [facility] Physician recommends and patient chooses bed at    Patient to be transferred to [facility] on  04/18/2013 Patient to be transferred to facility by   The following physician request were entered in Epic:   Additional Comments: Pt has existing PASARR  Cameron Ali BSW Intern

## 2013-04-18 NOTE — Clinical Social Work Note (Cosign Needed)
Spoke with pts sister, Johnny Bridge. Explained SNF rehab process. She reports she lives in [place], and that she feels pt would greatly benefit from SNF placement. Will continue to follow up with pt and sister regarding bed offers.    Cameron Ali BSW Intern   Santa Genera, LCSW Clinical Social Worker [phone])

## 2013-04-18 NOTE — Clinical Social Work Psychosocial (Cosign Needed)
Clinical Social Work Department BRIEF PSYCHOSOCIAL ASSESSMENT 04/18/2013  Patient:  [name], VOIGT     Account Number:  [id number]     Admit date:  04/17/2013  Clinical Social Worker:  Ambrose Pancoast Intern  Date/Time:  04/18/2013 11:03 AM  Referred by:  CSW  Date Referred:  04/18/2013 Referred for  SNF Placement   Other Referral:   Interview type:  Patient Other interview type:    PSYCHOSOCIAL DATA Living Status:  ALONE Admitted from facility:   Level of care:   Primary support name:  Brett Canales Primary support relationship to patient:  CHILD, ADULT Degree of support available:   Supportive    CURRENT CONCERNS Current Concerns  Post-Acute Placement   Other Concerns:    SOCIAL WORK ASSESSMENT / PLAN Met with pt at bedside. Pt oriented to self and place, not to time. Pt states she lives with her sons who help her most of the time when they are not working. Pt reports she needs help with most ADLs at home. Sister, Johnny Bridge and son Brett Canales are her best support.  Confirms she came to hospital due to fall. Discussed SNF placement with pt who is agreeable to bed search of [facility].Left SNF list in room. Pt requests for CSW to call her sister, Johnny Bridge. Attempted to call sister and left voicemail for return call. CSW will iniate bed search and continue to follow.   Assessment/plan status:  Psychosocial Support/Ongoing Assessment of Needs Other assessment/ plan:   Information/referral to community resources:   SNF List    PATIENT'S/FAMILY'S RESPONSE TO PLAN OF CARE: Pt agreeable to start SNF bed search for [facility]. CSW will follow up with bed offers.    Cameron Ali BSW Intern    Santa Genera, LCSW Clinical Social Worker [phone])

## 2013-04-18 NOTE — Clinical Social Work Note (Signed)
Patient ready for discharge today, will transfer to [facility] via [facility].  EMS called and will come when available.  FL2 reviewed w RN and updated as needed.  Discharge summary faxed to facility via [facility].  Discharge packet prepared and placed w shadow chart for transport.  Family, patient and facility all agreeable to placement.  CSW signing off as no further SW needs identified.    Santa Genera, LCSW Clinical Social Worker [phone])

## 2013-04-18 NOTE — Evaluation (Signed)
Occupational Therapy Evaluation Patient Details Name: [name] MRN: [id number] DOB: [birth year] Today's Date: 04/18/2013 Time: 0825-0910 OT Time Calculation (min): 45 min Evaluation 7829-5621 (25') Self Cares 0850-0910  (20')  OT Assessment / Plan / Recommendation History of present illness Patient is a 72 yo right handed female brought to ED by her family with progressive weakness, slurred speech, recurrent falls (with recent LUE fracture), hyperglycemia of 600's.  Admitted with atrial fib with RVR and chronic kidney disease.  Referral received for OT evaluation and treat   Clinical Impression   Patient with increased difficulty maintaining alertness this date.  She presents with increased difficulty completing self care tasks, functional mobility and overall activity tolerance/endurance is low.  Patient reports blurry vision however is noted to be able to track in all quads and locate all items on food tray this date.   Recommend Patient would benefit from continued skilled OT services to maximize functional potential/independence and SNF for continued rehab as patient is home alone during day because her sons work.      OT Assessment  Patient needs continued OT Services    Follow Up Recommendations  SNF    Barriers to Discharge Other (comment) patient at home alone during day as her sons work   Furniture conservator/restorer  Other (comment) (platform for walker )    Recommendations for Other Services PT consult  Frequency       Precautions / Restrictions Precautions Precautions: Fall Restrictions Weight Bearing Restrictions: Yes Other Position/Activity Restrictions: LUE due to recent fracture.       ADL  Eating/Feeding: Set up;Minimal assistance (sleepy this date with noted minimal spillage with use of spoon. able to manage coffe cup) Grooming: Moderate assistance Upper Body Dressing: Moderate assistance Lower Body Dressing: Maximal assistance Toilet Transfer: Minimal  assistance ADL Comments: patient requires mod encouragement for participation in tasks ths date.     OT Diagnosis: Generalized weakness;Disturbance of vision;Acute pain  OT Problem List: Decreased strength;Decreased coordination;Pain;Decreased activity tolerance;Decreased safety awareness;Impaired balance (sitting and/or standing);Decreased knowledge of precautions;Impaired UE functional use OT Treatment Interventions: Self-care/ADL training;Therapeutic activities;Therapeutic exercise;Neuromuscular education;Energy conservation;Patient/family education;DME and/or AE instruction;Balance training;Manual therapy   OT Goals(Current goals can be found in the care plan section) Acute Rehab OT Goals Patient Stated Goal: none stated OT Goal Formulation: With patient Time For Goal Achievement: 05/02/13 Potential to Achieve Goals: Fair ADL Goals Pt Will Perform Grooming: with supervision Pt Will Perform Upper Body Dressing: with min assist Pt Will Perform Lower Body Dressing: with mod assist;with adaptive equipment Pt Will Transfer to Toilet: with min guard assist Pt/caregiver will Perform Home Exercise Program: Increased ROM;Increased strength;With Supervision  Visit Information  History of Present Illness: Patient is a 72 yo right handed female brought to ED by her family with progressive weakness, slurred speech, recurrent falls (with recent LUE fracture), hyperglycemia of 600's.  Admitted with atrial fib with RVR and chronic kidney disease.  Referral received for OT evaluation and treat       Prior Functioning     Home Living Family/patient expects to be discharged to:: Skilled nursing facility Additional Comments: patient has shower chair, RW, BSC at home  Prior Function Level of Independence: Independent with assistive device(s) Comments: Patient lives with her 3 sons who work outside of home.  lives in single story home with no steps to enter;  tub/shower combo with curtain   Communication Communication: No difficulties Dominant Hand: Right         Vision/Perception Vision - History  Baseline Vision: Wears glasses all the time Patient Visual Report: Blurring of vision   Cognition  Cognition Arousal/Alertness: Lethargic Behavior During Therapy: WFL for tasks assessed/performed;Flat affect Overall Cognitive Status: Within Functional Limits for tasks assessed (patient oriented to name, birthday, and that she is in the hospital however unable  to identify town and states it is 2005)    Extremity/Trunk Assessment Upper Extremity Assessment Upper Extremity Assessment:  (slight tremor noted RUE with attempt to self feed.   deliberate movements with BUE and difficulty wthi opposition movements of R hand.  ) LUE Deficits / Details: patient with distal half of LUE wrapped in ace wrap from recent fall and fracture .   Lower Extremity Assessment Lower Extremity Assessment: Defer to PT evaluation     Mobility Bed Mobility Bed Mobility: Rolling Left Rolling Left: 4: Min assist;With rail Details for Bed Mobility Assistance: HOB elevated  Transfers Transfers: Sit to Stand;Stand to Sit Sit to Stand: 3: Mod assist Stand to Sit: 4: Min guard;4: Min assist           End of Session OT - End of Session Equipment Utilized During Treatment: Gait belt Activity Tolerance: Patient tolerated treatment well Patient left: in chair;with call bell/phone within reach  GO     Velora Mediate, OTR/L 04/18/2013, 9:13 AM

## 2013-04-18 NOTE — Care Management Note (Addendum)
    Page 1 of 1   04/18/2013     3:12:36 PM   CARE MANAGEMENT NOTE 04/18/2013  Patient:  AUGUST, GOSSER   Account Number:  [id number]  Date Initiated:  04/18/2013  Documentation initiated by:  Sharrie Rothman  Subjective/Objective Assessment:   Pt admitted from home with a fib with rvr. Pt lives alone but has 2 sisters who are very active in the care of the pt. Pt had been independent with ADL's until 3 weeks ago. Family is requesting placement.     Action/Plan:   CSW aware of possible placement need. Awaiting PT recommendations. CSW will arrange discharge to facility if appropriate or CM will arrange [facility] if pt d/c's home.   Anticipated DC Date:  04/19/2013   Anticipated DC Plan:  SKILLED NURSING FACILITY  In-house referral  Clinical Social Worker      DC Planning Services  CM consult      Choice offered to / List presented to:             Status of service:  Completed, signed off Medicare Important Message given?   (If response is "NO", the following Medicare IM given date fields will be blank) Date Medicare IM given:   Date Additional Medicare IM given:    Discharge Disposition:  SKILLED NURSING FACILITY  Per UR Regulation:    If discussed at Long Length of Stay Meetings, dates discussed:    Comments:  04/18/13 1510 Arlyss Queen, RN BSN CM Pt discharged to [facility] today. CSW to arrange discharge to facility.  04/18/13 1100 Arlyss Queen, RN BSN CM

## 2013-04-18 NOTE — Progress Notes (Signed)
UR chart review completed.  

## 2013-04-18 NOTE — Clinical Social Work Placement (Cosign Needed Addendum)
Clinical Social Work Department CLINICAL SOCIAL WORK PLACEMENT NOTE 04/18/2013  Patient:  [name], [name]  Account Number:  [id number] Admit date:  04/17/2013  Clinical Social Worker:  Cameron Ali, BSW Intern  Date/time:  04/18/2013 11:37 AM  Clinical Social Work is seeking post-discharge placement for this patient at the following level of care:   SKILLED NURSING   (*CSW will update this form in Epic as items are completed)   04/18/2013  Patient/family provided with [facility] Department of Clinical Social Work's list of facilities offering this level of care within the geographic area requested by the patient (or if unable, by the patient's family).  04/18/2013  Patient/family informed of their freedom to choose among providers that offer the needed level of care, that participate in Medicare, Medicaid or managed care program needed by the patient, have an available bed and are willing to accept the patient.  04/18/2013  Patient/family informed of [facility]' ownership interest in [facility], as well as of the fact that they are under no obligation to receive care at this facility.  PASARR submitted to EDS on  PASARR number received from EDS on   FL2 transmitted to all facilities in geographic area requested by pt/family on  04/18/2013 FL2 transmitted to all facilities within larger geographic area on 04/18/2013  Patient informed that his/her managed care company has contracts with or will negotiate with  certain facilities, including the following:     Patient/family informed of bed offers received:   Patient chooses bed at  Physician recommends and patient chooses bed at    Patient to be transferred to  on   Patient to be transferred to facility by   The following physician request were entered in Epic:   Additional Comments: Pt has existing PASARR  Cameron Ali BSW Intern   Santa Genera, LCSW Clinical Social Worker [phone])

## 2013-04-18 NOTE — Clinical Social Work Placement (Signed)
    Clinical Social Work Department CLINICAL SOCIAL WORK PLACEMENT NOTE 04/18/2013  Patient:  [name], [name]  Account Number:  [id number] Admit date:  04/17/2013  Clinical Social Worker:  Irving Burton SZPYRKA, CLINICAL SOCIAL WORKER  Date/time:  04/18/2013 11:37 AM  Clinical Social Work is seeking post-discharge placement for this patient at the following level of care:   SKILLED NURSING   (*CSW will update this form in Epic as items are completed)   04/18/2013  Patient/family provided with [facility] Department of Clinical Social Work's list of facilities offering this level of care within the geographic area requested by the patient (or if unable, by the patient's family).  04/18/2013  Patient/family informed of their freedom to choose among providers that offer the needed level of care, that participate in Medicare, Medicaid or managed care program needed by the patient, have an available bed and are willing to accept the patient.  04/18/2013  Patient/family informed of [facility]' ownership interest in [facility], as well as of the fact that they are under no obligation to receive care at this facility.  PASARR submitted to EDS on  PASARR number received from EDS on   FL2 transmitted to all facilities in geographic area requested by pt/family on  04/18/2013 FL2 transmitted to all facilities within larger geographic area on 04/18/2013  Patient informed that his/her managed care company has contracts with or will negotiate with  certain facilities, including the following:     Patient/family informed of bed offers received:  04/18/2013 Patient chooses bed at [facility] Physician recommends and patient chooses bed at    Patient to be transferred to [facility] on  04/18/2013 Patient to be transferred to facility by [facility]  The following physician request were entered in Epic:   Additional Comments: Pt has existing  PASARR  Santa Genera, LCSW Clinical Social Worker [phone])

## 2013-07-01 ENCOUNTER — Emergency Department (HOSPITAL_COMMUNITY): Payer: Medicare Other

## 2013-07-01 ENCOUNTER — Inpatient Hospital Stay (HOSPITAL_COMMUNITY)
Admission: EM | Admit: 2013-07-01 | Discharge: 2013-07-03 | DRG: 065 | Disposition: A | Discharge status: Skilled Nursing Facility | Payer: Medicare Other | Attending: Internal Medicine | Admitting: Internal Medicine

## 2013-07-01 ENCOUNTER — Inpatient Hospital Stay (HOSPITAL_COMMUNITY): Payer: Medicare Other

## 2013-07-01 ENCOUNTER — Encounter (HOSPITAL_COMMUNITY): Payer: Self-pay | Admitting: Emergency Medicine

## 2013-07-01 DIAGNOSIS — N189 Chronic kidney disease, unspecified: Secondary | ICD-10-CM

## 2013-07-01 DIAGNOSIS — E1129 Type 2 diabetes mellitus with other diabetic kidney complication: Secondary | ICD-10-CM

## 2013-07-01 DIAGNOSIS — Z91199 Patient's noncompliance with other medical treatment and regimen due to unspecified reason: Secondary | ICD-10-CM

## 2013-07-01 DIAGNOSIS — B029 Zoster without complications: Secondary | ICD-10-CM

## 2013-07-01 DIAGNOSIS — I252 Old myocardial infarction: Secondary | ICD-10-CM

## 2013-07-01 DIAGNOSIS — Z9181 History of falling: Secondary | ICD-10-CM

## 2013-07-01 DIAGNOSIS — R001 Bradycardia, unspecified: Secondary | ICD-10-CM

## 2013-07-01 DIAGNOSIS — F411 Generalized anxiety disorder: Secondary | ICD-10-CM | POA: Diagnosis present

## 2013-07-01 DIAGNOSIS — Y92009 Unspecified place in unspecified non-institutional (private) residence as the place of occurrence of the external cause: Secondary | ICD-10-CM

## 2013-07-01 DIAGNOSIS — E1142 Type 2 diabetes mellitus with diabetic polyneuropathy: Secondary | ICD-10-CM | POA: Diagnosis present

## 2013-07-01 DIAGNOSIS — R079 Chest pain, unspecified: Secondary | ICD-10-CM

## 2013-07-01 DIAGNOSIS — I119 Hypertensive heart disease without heart failure: Secondary | ICD-10-CM

## 2013-07-01 DIAGNOSIS — I2510 Atherosclerotic heart disease of native coronary artery without angina pectoris: Secondary | ICD-10-CM | POA: Diagnosis present

## 2013-07-01 DIAGNOSIS — IMO0001 Reserved for inherently not codable concepts without codable children: Secondary | ICD-10-CM | POA: Diagnosis present

## 2013-07-01 DIAGNOSIS — D649 Anemia, unspecified: Secondary | ICD-10-CM

## 2013-07-01 DIAGNOSIS — R05 Cough: Secondary | ICD-10-CM

## 2013-07-01 DIAGNOSIS — I129 Hypertensive chronic kidney disease with stage 1 through stage 4 chronic kidney disease, or unspecified chronic kidney disease: Secondary | ICD-10-CM | POA: Diagnosis present

## 2013-07-01 DIAGNOSIS — R058 Other specified cough: Secondary | ICD-10-CM

## 2013-07-01 DIAGNOSIS — E875 Hyperkalemia: Secondary | ICD-10-CM

## 2013-07-01 DIAGNOSIS — E872 Acidosis: Secondary | ICD-10-CM

## 2013-07-01 DIAGNOSIS — I5031 Acute diastolic (congestive) heart failure: Secondary | ICD-10-CM

## 2013-07-01 DIAGNOSIS — Z6841 Body Mass Index (BMI) 40.0 and over, adult: Secondary | ICD-10-CM

## 2013-07-01 DIAGNOSIS — R739 Hyperglycemia, unspecified: Secondary | ICD-10-CM

## 2013-07-01 DIAGNOSIS — Z8249 Family history of ischemic heart disease and other diseases of the circulatory system: Secondary | ICD-10-CM

## 2013-07-01 DIAGNOSIS — J189 Pneumonia, unspecified organism: Secondary | ICD-10-CM

## 2013-07-01 DIAGNOSIS — I5032 Chronic diastolic (congestive) heart failure: Secondary | ICD-10-CM | POA: Diagnosis present

## 2013-07-01 DIAGNOSIS — R4781 Slurred speech: Secondary | ICD-10-CM | POA: Diagnosis present

## 2013-07-01 DIAGNOSIS — Z794 Long term (current) use of insulin: Secondary | ICD-10-CM

## 2013-07-01 DIAGNOSIS — Z8673 Personal history of transient ischemic attack (TIA), and cerebral infarction without residual deficits: Secondary | ICD-10-CM

## 2013-07-01 DIAGNOSIS — B009 Herpesviral infection, unspecified: Secondary | ICD-10-CM | POA: Diagnosis present

## 2013-07-01 DIAGNOSIS — I6350 Cerebral infarction due to unspecified occlusion or stenosis of unspecified cerebral artery: Principal | ICD-10-CM | POA: Diagnosis present

## 2013-07-01 DIAGNOSIS — I509 Heart failure, unspecified: Secondary | ICD-10-CM | POA: Diagnosis present

## 2013-07-01 DIAGNOSIS — Z91148 Patient's other noncompliance with medication regimen for other reason: Secondary | ICD-10-CM

## 2013-07-01 DIAGNOSIS — N184 Chronic kidney disease, stage 4 (severe): Secondary | ICD-10-CM

## 2013-07-01 DIAGNOSIS — R7401 Elevation of levels of liver transaminase levels: Secondary | ICD-10-CM

## 2013-07-01 DIAGNOSIS — R740 Nonspecific elevation of levels of transaminase and lactic acid dehydrogenase [LDH]: Secondary | ICD-10-CM

## 2013-07-01 DIAGNOSIS — G4730 Sleep apnea, unspecified: Secondary | ICD-10-CM | POA: Diagnosis present

## 2013-07-01 DIAGNOSIS — N179 Acute kidney failure, unspecified: Secondary | ICD-10-CM

## 2013-07-01 DIAGNOSIS — I10 Essential (primary) hypertension: Secondary | ICD-10-CM

## 2013-07-01 DIAGNOSIS — E1122 Type 2 diabetes mellitus with diabetic chronic kidney disease: Secondary | ICD-10-CM

## 2013-07-01 DIAGNOSIS — I499 Cardiac arrhythmia, unspecified: Secondary | ICD-10-CM

## 2013-07-01 DIAGNOSIS — IMO0002 Reserved for concepts with insufficient information to code with codable children: Secondary | ICD-10-CM | POA: Diagnosis present

## 2013-07-01 DIAGNOSIS — R4182 Altered mental status, unspecified: Secondary | ICD-10-CM

## 2013-07-01 DIAGNOSIS — Z9114 Patient's other noncompliance with medication regimen: Secondary | ICD-10-CM

## 2013-07-01 DIAGNOSIS — I639 Cerebral infarction, unspecified: Secondary | ICD-10-CM | POA: Diagnosis present

## 2013-07-01 DIAGNOSIS — E785 Hyperlipidemia, unspecified: Secondary | ICD-10-CM

## 2013-07-01 DIAGNOSIS — E1149 Type 2 diabetes mellitus with other diabetic neurological complication: Secondary | ICD-10-CM | POA: Diagnosis present

## 2013-07-01 DIAGNOSIS — Z800 Family history of malignant neoplasm of digestive organs: Secondary | ICD-10-CM

## 2013-07-01 DIAGNOSIS — E119 Type 2 diabetes mellitus without complications: Secondary | ICD-10-CM

## 2013-07-01 DIAGNOSIS — W19XXXA Unspecified fall, initial encounter: Secondary | ICD-10-CM

## 2013-07-01 DIAGNOSIS — D638 Anemia in other chronic diseases classified elsewhere: Secondary | ICD-10-CM | POA: Diagnosis present

## 2013-07-01 DIAGNOSIS — E860 Dehydration: Secondary | ICD-10-CM

## 2013-07-01 DIAGNOSIS — G2581 Restless legs syndrome: Secondary | ICD-10-CM | POA: Diagnosis present

## 2013-07-01 DIAGNOSIS — E1165 Type 2 diabetes mellitus with hyperglycemia: Secondary | ICD-10-CM

## 2013-07-01 DIAGNOSIS — E1140 Type 2 diabetes mellitus with diabetic neuropathy, unspecified: Secondary | ICD-10-CM

## 2013-07-01 DIAGNOSIS — E8729 Other acidosis: Secondary | ICD-10-CM

## 2013-07-01 DIAGNOSIS — R748 Abnormal levels of other serum enzymes: Secondary | ICD-10-CM

## 2013-07-01 DIAGNOSIS — I739 Peripheral vascular disease, unspecified: Secondary | ICD-10-CM | POA: Diagnosis present

## 2013-07-01 DIAGNOSIS — N289 Disorder of kidney and ureter, unspecified: Secondary | ICD-10-CM

## 2013-07-01 DIAGNOSIS — I4891 Unspecified atrial fibrillation: Secondary | ICD-10-CM | POA: Diagnosis present

## 2013-07-01 DIAGNOSIS — Z9119 Patient's noncompliance with other medical treatment and regimen: Secondary | ICD-10-CM

## 2013-07-01 DIAGNOSIS — I214 Non-ST elevation (NSTEMI) myocardial infarction: Secondary | ICD-10-CM

## 2013-07-01 LAB — COMPREHENSIVE METABOLIC PANEL
ALBUMIN: 2.9 g/dL — AB (ref 3.5–5.2)
ALT: 45 U/L — ABNORMAL HIGH (ref 0–35)
AST: 29 U/L (ref 0–37)
Alkaline Phosphatase: 83 U/L (ref 39–117)
BILIRUBIN TOTAL: 0.2 mg/dL — AB (ref 0.3–1.2)
BUN: 88 mg/dL — AB (ref 6–23)
CALCIUM: 8.8 mg/dL (ref 8.4–10.5)
CHLORIDE: 106 meq/L (ref 96–112)
CO2: 20 mEq/L (ref 19–32)
Creatinine, Ser: 2.68 mg/dL — ABNORMAL HIGH (ref 0.50–1.10)
GFR calc Af Amer: 19 mL/min — ABNORMAL LOW (ref >90)
GFR, EST NON AFRICAN AMERICAN: 17 mL/min — AB (ref >90)
Glucose, Bld: 261 mg/dL — ABNORMAL HIGH (ref 70–99)
Potassium: 4.7 mEq/L (ref 3.7–5.3)
Sodium: 141 mEq/L (ref 137–147)
Total Protein: 7.1 g/dL (ref 6.0–8.3)

## 2013-07-01 LAB — URINALYSIS, ROUTINE W REFLEX MICROSCOPIC
Bilirubin Urine: NEGATIVE
GLUCOSE, UA: NEGATIVE mg/dL
HGB URINE DIPSTICK: NEGATIVE
Ketones, ur: NEGATIVE mg/dL
Leukocytes, UA: NEGATIVE
Nitrite: NEGATIVE
Protein, ur: 30 mg/dL — AB
Specific Gravity, Urine: 1.02 (ref 1.005–1.030)
Urobilinogen, UA: 0.2 mg/dL (ref 0.0–1.0)
pH: 5 (ref 5.0–8.0)

## 2013-07-01 LAB — CBC WITH DIFFERENTIAL/PLATELET
Basophils Absolute: 0 10*3/uL (ref 0.0–0.1)
Basophils Relative: 0 % (ref 0–1)
Eosinophils Absolute: 0.5 10*3/uL (ref 0.0–0.7)
Eosinophils Relative: 7 % — ABNORMAL HIGH (ref 0–5)
HCT: 30.8 % — ABNORMAL LOW (ref 36.0–46.0)
HEMOGLOBIN: 10.1 g/dL — AB (ref 12.0–15.0)
LYMPHS ABS: 1.9 10*3/uL (ref 0.7–4.0)
LYMPHS PCT: 28 % (ref 12–46)
MCH: 31.2 pg (ref 26.0–34.0)
MCHC: 32.8 g/dL (ref 30.0–36.0)
MCV: 95.1 fL (ref 78.0–100.0)
MONOS PCT: 7 % (ref 3–12)
Monocytes Absolute: 0.5 10*3/uL (ref 0.1–1.0)
NEUTROS PCT: 57 % (ref 43–77)
Neutro Abs: 3.9 10*3/uL (ref 1.7–7.7)
Platelets: 154 10*3/uL (ref 150–400)
RBC: 3.24 MIL/uL — ABNORMAL LOW (ref 3.87–5.11)
RDW: 18.6 % — ABNORMAL HIGH (ref 11.5–15.5)
WBC: 6.8 10*3/uL (ref 4.0–10.5)

## 2013-07-01 LAB — GLUCOSE, CAPILLARY
GLUCOSE-CAPILLARY: 203 mg/dL — AB (ref 70–99)
Glucose-Capillary: 164 mg/dL — ABNORMAL HIGH (ref 70–99)
Glucose-Capillary: 204 mg/dL — ABNORMAL HIGH (ref 70–99)

## 2013-07-01 LAB — LACTIC ACID, PLASMA: Lactic Acid, Venous: 1.1 mmol/L (ref 0.5–2.2)

## 2013-07-01 LAB — HEMOGLOBIN A1C
Hgb A1c MFr Bld: 8.7 % — ABNORMAL HIGH (ref <5.7)
Mean Plasma Glucose: 203 mg/dL — ABNORMAL HIGH (ref <117)

## 2013-07-01 LAB — POCT I-STAT, CHEM 8
BUN: 95 mg/dL — ABNORMAL HIGH (ref 6–23)
Calcium, Ion: 1.21 mmol/L (ref 1.13–1.30)
Chloride: 111 mEq/L (ref 96–112)
Creatinine, Ser: 2.6 mg/dL — ABNORMAL HIGH (ref 0.50–1.10)
Glucose, Bld: 249 mg/dL — ABNORMAL HIGH (ref 70–99)
HEMATOCRIT: 32 % — AB (ref 36.0–46.0)
Hemoglobin: 10.9 g/dL — ABNORMAL LOW (ref 12.0–15.0)
Potassium: 4.6 mEq/L (ref 3.7–5.3)
Sodium: 142 mEq/L (ref 137–147)
TCO2: 19 mmol/L (ref 0–100)

## 2013-07-01 LAB — PRO B NATRIURETIC PEPTIDE: PRO B NATRI PEPTIDE: 4028 pg/mL — AB (ref 0–125)

## 2013-07-01 LAB — URINE MICROSCOPIC-ADD ON

## 2013-07-01 LAB — TSH: TSH: 2.435 u[IU]/mL (ref 0.350–4.500)

## 2013-07-01 LAB — TROPONIN I: Troponin I: <0.3 ng/mL (ref <0.30)

## 2013-07-01 LAB — AMMONIA: AMMONIA: 54 umol/L (ref 11–60)

## 2013-07-01 LAB — MAGNESIUM: MAGNESIUM: 2.3 mg/dL (ref 1.5–2.5)

## 2013-07-01 MED ORDER — INSULIN ASPART 100 UNIT/ML ~~LOC~~ SOLN
0.0000 [IU] | Freq: Three times a day (TID) | SUBCUTANEOUS | Status: DC
  Administered 2013-07-02: 1 [IU] via SUBCUTANEOUS
  Administered 2013-07-02: 2 [IU] via SUBCUTANEOUS
  Administered 2013-07-02: 5 [IU] via SUBCUTANEOUS
  Administered 2013-07-03: 2 [IU] via SUBCUTANEOUS
  Administered 2013-07-03: 5 [IU] via SUBCUTANEOUS

## 2013-07-01 MED ORDER — INSULIN ASPART 100 UNIT/ML ~~LOC~~ SOLN
0.0000 [IU] | SUBCUTANEOUS | Status: DC
  Administered 2013-07-01: 2 [IU] via SUBCUTANEOUS
  Filled 2013-07-01: qty 1

## 2013-07-01 MED ORDER — SODIUM CHLORIDE 0.9 % IV BOLUS (SEPSIS)
1000.0000 mL | Freq: Once | INTRAVENOUS | Status: AC
Start: 2013-07-01 — End: 2013-07-01
  Administered 2013-07-01: 1000 mL via INTRAVENOUS

## 2013-07-01 MED ORDER — ASPIRIN 325 MG PO TABS
325.0000 mg | ORAL_TABLET | Freq: Every day | ORAL | Status: DC
  Administered 2013-07-01 – 2013-07-02 (×2): 325 mg via ORAL
  Filled 2013-07-01 (×2): qty 1

## 2013-07-01 MED ORDER — HEPARIN SODIUM (PORCINE) 5000 UNIT/ML IJ SOLN
5000.0000 [IU] | Freq: Three times a day (TID) | INTRAMUSCULAR | Status: DC
  Administered 2013-07-01 – 2013-07-03 (×6): 5000 [IU] via SUBCUTANEOUS
  Filled 2013-07-01 (×6): qty 1

## 2013-07-01 MED ORDER — TORSEMIDE 20 MG PO TABS
20.0000 mg | ORAL_TABLET | Freq: Every day | ORAL | Status: DC
  Administered 2013-07-01 – 2013-07-03 (×3): 20 mg via ORAL
  Filled 2013-07-01 (×3): qty 1

## 2013-07-01 MED ORDER — ONDANSETRON HCL 4 MG/2ML IJ SOLN: 4.0000 mg | Freq: Three times a day (TID) | INTRAMUSCULAR | Status: AC | PRN

## 2013-07-01 MED ORDER — INFLUENZA VAC SPLIT QUAD 0.5 ML IM SUSP
0.5000 mL | INTRAMUSCULAR | Status: DC
  Filled 2013-07-01: qty 0.5

## 2013-07-01 MED ORDER — CARVEDILOL 12.5 MG PO TABS
12.5000 mg | ORAL_TABLET | Freq: Two times a day (BID) | ORAL | Status: DC
  Administered 2013-07-01 – 2013-07-03 (×4): 12.5 mg via ORAL
  Filled 2013-07-01 (×3): qty 1

## 2013-07-01 MED ORDER — ATORVASTATIN CALCIUM 40 MG PO TABS
40.0000 mg | ORAL_TABLET | Freq: Every day | ORAL | Status: DC
  Administered 2013-07-01 – 2013-07-02 (×2): 40 mg via ORAL
  Filled 2013-07-01 (×2): qty 1

## 2013-07-01 MED ORDER — ISOSORBIDE MONONITRATE ER 60 MG PO TB24
30.0000 mg | ORAL_TABLET | Freq: Every day | ORAL | Status: DC
  Administered 2013-07-01 – 2013-07-03 (×3): 30 mg via ORAL
  Filled 2013-07-01 (×3): qty 1

## 2013-07-01 MED ORDER — NYSTATIN 100000 UNIT/GM EX POWD
Freq: Two times a day (BID) | CUTANEOUS | Status: DC
  Administered 2013-07-01 – 2013-07-03 (×5): via TOPICAL
  Filled 2013-07-01: qty 15

## 2013-07-01 MED ORDER — PNEUMOCOCCAL VAC POLYVALENT 25 MCG/0.5ML IJ INJ
0.5000 mL | INJECTION | INTRAMUSCULAR | Status: DC
  Filled 2013-07-01: qty 0.5

## 2013-07-01 MED ORDER — FERROUS SULFATE 325 (65 FE) MG PO TABS
325.0000 mg | ORAL_TABLET | Freq: Every day | ORAL | Status: DC
  Administered 2013-07-02 – 2013-07-03 (×2): 325 mg via ORAL
  Filled 2013-07-01 (×2): qty 1

## 2013-07-01 MED ORDER — CARVEDILOL 3.125 MG PO TABS
18.7500 mg | ORAL_TABLET | Freq: Two times a day (BID) | ORAL | Status: DC
  Filled 2013-07-01: qty 2
  Filled 2013-07-01: qty 1

## 2013-07-01 MED ORDER — SENNOSIDES-DOCUSATE SODIUM 8.6-50 MG PO TABS: 1.0000 | ORAL_TABLET | Freq: Every evening | ORAL | Status: DC | PRN

## 2013-07-01 MED ORDER — SODIUM CHLORIDE 0.9 % IV SOLN
INTRAVENOUS | Status: DC
  Administered 2013-07-01: 14:00:00 via INTRAVENOUS

## 2013-07-01 MED ORDER — FUROSEMIDE 10 MG/ML IJ SOLN
40.0000 mg | Freq: Once | INTRAMUSCULAR | Status: AC
  Administered 2013-07-01: 40 mg via INTRAVENOUS
  Filled 2013-07-01: qty 4

## 2013-07-01 MED ORDER — CARVEDILOL 12.5 MG PO TABS: 12.5000 mg | ORAL_TABLET | Freq: Two times a day (BID) | ORAL | Status: DC

## 2013-07-01 MED ORDER — PREGABALIN 50 MG PO CAPS
100.0000 mg | ORAL_CAPSULE | Freq: Every evening | ORAL | Status: DC
  Administered 2013-07-01: 100 mg via ORAL
  Filled 2013-07-01: qty 2

## 2013-07-01 MED ORDER — DILTIAZEM HCL 30 MG PO TABS
30.0000 mg | ORAL_TABLET | Freq: Three times a day (TID) | ORAL | Status: AC
  Administered 2013-07-01 – 2013-07-02 (×5): 30 mg via ORAL
  Filled 2013-07-01 (×5): qty 1

## 2013-07-01 MED ORDER — VITAMIN D (ERGOCALCIFEROL) 1.25 MG (50000 UNIT) PO CAPS
50000.0000 [IU] | ORAL_CAPSULE | ORAL | Status: DC
  Administered 2013-07-03: 50000 [IU] via ORAL
  Filled 2013-07-01 (×2): qty 1

## 2013-07-01 NOTE — Consult Note (Signed)
The patient was seen and examined, and I agree with the assessment and plan as documented above, with modifications as noted below. Pt with known CAD (medically managed), HTN, CKD stage IV, and h/o atrial fibrillation (hospitalized in 04/2013) admitted with subacute left occipital lobe infarct. She has not been anticoagulated as an outpatient due to questions of medication noncompliance and h/o frequent falls. At present, her HR is in the 110-120 bpm range on carvedilol 12.5 mg bid (had been on 6.25 mg bid as an outpatient). She was taking ASA 81 mg daily as an outpatient, now on 325 mg daily. At the time of my interview, when asked about symptoms regarding chest pain, palpitations, and shortness of breath, she replied "a little bit" to every question. No family members were present. I will start short-acting diltiazem 30 mg q 8 hours for better rate control. Given concerns regarding medication noncompliance and h/o frequent falls, anticoagulation is problematic, in spite of the fact that it is clearly indicated given her h/o CVA (CHADS-Vasc score is 8). Given her severely compromised renal function, the only conceivable option is warfarin. If this is pursued, neurology should be consulted regarding the timing of initiation. BP is well controlled. I will also give one dose of 40 mg IV Lasix to aid in resolution of pulmonary edema, likely due to diastolic decompensation from rapid atrial fibrillation.

## 2013-07-01 NOTE — ED Notes (Signed)
Report given to Janett Billow, RN on 3rd floor,

## 2013-07-01 NOTE — ED Notes (Addendum)
Pt placed on oxgyen @ 2LPM via [place] per admission orders, remains a-fib on cardiac monitor, pt and family updated on plan of care, delay in obtaining bed, pt transported to MRI

## 2013-07-01 NOTE — ED Notes (Signed)
Pt c/o weakness since yesterday. EMS called out for hyperglycemia on arrival pt's blood sugar was 261.

## 2013-07-01 NOTE — ED Provider Notes (Signed)
CSN: [id number]     Arrival date & time 07/01/13  0425 History   First MD Initiated Contact with Patient 07/01/13 [phone]     Chief Complaint  Patient presents with  . Weakness   (Consider location/radiation/quality/duration/timing/severity/associated sxs/prior Treatment) HPI Comments: 73 year old female, history of diabetes, heart disease, presents with a complaint of altered mental status. According to the son who lives with his mother the patient has had several days of gradual decline including the inability to ambulate safely, of note she had a fall a couple of weeks ago, she went to her family doctor yesterday for evaluation from the fall, the patient and family members state that nothing was done during the visit, no x-rays, the lab work. She had a urine sample drawn at a medical facility several days ago and was told that there was no infection. The symptoms are gradually worsening and now the patient is unable to ambulate because of generalized weakness, she is not coughing, not vomiting and not having diarrhea. Her blood sugar prior to arrival was registered as high on the monitor, despite this one of her children gave her a sandwich to eat. EMS measure blood sugar at 260.  Patient is a 73 y.o. female presenting with weakness. The history is provided by the patient and a relative.  Weakness    Past Medical History  Diagnosis Date  . Diabetes mellitus   . Hypertension   . Hyperlipemia   . Fibromyalgia   . Obesity   . Sleep apnea     No CPAP  . Anxiety   . Hiatal hernia   . NSTEMI (non-ST elevated myocardial infarction) 04/27/2011  . Anemia 04/27/2011  . Restless leg syndrome   . CAD (coronary artery disease)     small dominant right coronary artery with diffuse 95% stenosis. The LAD had 70% stenosis in the mid vessel beyond the diagonal branch but was a small vessel. Circumflex had proximal 30% stenosis.   Marland Kitchen External hemorrhoids   . Arrhythmia 10/11/2011  . Community acquired  pneumonia 06/24/2012  . Renal insufficiency 05/06/2011  . Shingles    Past Surgical History  Procedure Laterality Date  . Appendectomy    . Fracture surgery      left humerous  . Ganglion cyst removal     Family History  Problem Relation Age of Onset  . Stomach cancer Mother   . Heart disease Maternal Grandmother   . Heart disease Maternal Grandfather    History  Substance Use Topics  . Smoking status: Never Smoker   . Smokeless tobacco: Never Used  . Alcohol Use: No   OB History   Grav Para Term Preterm Abortions TAB SAB Ect Mult Living                 Review of Systems  Neurological: Positive for weakness.  All other systems reviewed and are negative.    Allergies  Codeine  Home Medications   Current Outpatient Rx  Name  Route  Sig  Dispense  Refill  . carvedilol (COREG) 12.5 MG tablet   Oral   Take 1 tablet (12.5 mg total) by mouth 2 (two) times daily with a meal.   30 tablet   0   . Cyanocobalamin 500 MCG SUBL   Sublingual   Place 1 tablet under the tongue 2 (two) times daily.         . ferrous sulfate 325 (65 FE) MG tablet   Oral   Take 325 mg  by mouth daily with breakfast.         . glipiZIDE (GLUCOTROL) 10 MG tablet   Oral   Take 10 mg by mouth daily before breakfast.         . insulin aspart (NOVOLOG FLEXPEN) 100 UNIT/ML SOPN FlexPen   Subcutaneous   Inject 6 Units into the skin 3 (three) times daily with meals.         . insulin glargine (LANTUS) 100 UNIT/ML injection   Subcutaneous   Inject 45 Units into the skin daily.          . isosorbide mononitrate (IMDUR) 30 MG 24 hr tablet   Oral   Take 30 mg by mouth daily.         . nitroGLYCERIN (NITROSTAT) 0.4 MG SL tablet   Sublingual   Place 1 mg under the tongue every 5 (five) minutes x 3 doses as needed for chest pain. For chest pain         . nystatin (MYCOSTATIN/NYSTOP) 100000 UNIT/GM POWD   Topical   Apply 1 g topically 2 (two) times daily.   1 Bottle   0   .  pregabalin (LYRICA) 50 MG capsule   Oral   Take 100 mg by mouth every evening.          . rosuvastatin (CRESTOR) 20 MG tablet   Oral   Take 20 mg by mouth at bedtime.           . torsemide (DEMADEX) 20 MG tablet   Oral   Take 2 tablets (40 mg total) by mouth daily.   60 tablet   0   . Vitamin D, Ergocalciferol, (DRISDOL) 50000 UNITS CAPS capsule   Oral   Take 50,000 Units by mouth every Thursday.          BP 146/63  Pulse 123  Temp(Src) 97.6 F (36.4 C) (Rectal)  Resp 17  Wt 240 lb (108.863 kg)  SpO2 97% Physical Exam  Nursing note and vitals reviewed. Constitutional: She appears well-developed and well-nourished.  Ill appearing  HENT:  Head: Normocephalic and atraumatic.  Mouth/Throat: No oropharyngeal exudate.  Mucous membranes dry  Eyes: Conjunctivae and EOM are normal. Pupils are equal, round, and reactive to light. Right eye exhibits no discharge. Left eye exhibits no discharge. No scleral icterus.  Neck: Normal range of motion. Neck supple. No JVD present. No thyromegaly present.  Cardiovascular: Normal heart sounds and intact distal pulses.  Exam reveals no gallop and no friction rub.   No murmur heard. Tachycardic, irregularly irregular rhythm  Pulmonary/Chest: Effort normal and breath sounds normal. No respiratory distress. She has no wheezes. She has no rales.  Abdominal: Soft. Bowel sounds are normal. She exhibits no distension and no mass. There is no tenderness.  Slight bruise in the mid to upper abdomen just right of midline, no significant tenderness other than suprapubic area  Musculoskeletal: Normal range of motion. She exhibits edema (  Bilateral symmetrical 1+ pitting edema of the lower extremities). She exhibits no tenderness.  Lymphadenopathy:    She has no cervical adenopathy.  Neurological: She is alert.  Moves all extremities x4, normal strength in all 4 extremities, the patient has a myoclonic jerking of all 4 extremities, she has asterixis  of the upper extremities, speech is slightly slurred but answers questions, no obvious facial droop  Skin: Skin is warm and dry. No rash noted. No erythema.  Psychiatric: She has a normal mood and affect. Her behavior is  normal.    ED Course  Procedures (including critical care time) Labs Review Labs Reviewed  COMPREHENSIVE METABOLIC PANEL - Abnormal; Notable for the following:    Glucose, Bld 261 (*)    BUN 88 (*)    Creatinine, Ser 2.68 (*)    Albumin 2.9 (*)    ALT 45 (*)    Total Bilirubin 0.2 (*)    GFR calc non Af Amer 17 (*)    GFR calc Af Amer 19 (*)    All other components within normal limits  CBC WITH DIFFERENTIAL - Abnormal; Notable for the following:    RBC 3.24 (*)    Hemoglobin 10.1 (*)    HCT 30.8 (*)    RDW 18.6 (*)    Eosinophils Relative 7 (*)    All other components within normal limits  URINALYSIS, ROUTINE W REFLEX MICROSCOPIC - Abnormal; Notable for the following:    Protein, ur 30 (*)    All other components within normal limits  PRO B NATRIURETIC PEPTIDE - Abnormal; Notable for the following:    Pro B Natriuretic peptide (BNP) 4028.0 (*)    All other components within normal limits  POCT I-STAT, CHEM 8 - Abnormal; Notable for the following:    BUN 95 (*)    Creatinine, Ser 2.60 (*)    Glucose, Bld 249 (*)    Hemoglobin 10.9 (*)    HCT 32.0 (*)    All other components within normal limits  LACTIC ACID, PLASMA  AMMONIA  TROPONIN I  MAGNESIUM  URINE MICROSCOPIC-ADD ON   Imaging Review Dg Chest Port 1 View  07/01/2013   CLINICAL DATA:  Chest pain, hyperglycemia and weakness.  EXAM: PORTABLE CHEST - 1 VIEW  COMPARISON:  Chest radiograph April 17, 2013  FINDINGS: Cardiac silhouette remains moderately enlarged, mediastinal silhouette is unremarkable. Increasing interstitial prominence with central pulmonary vasculature engorgement. No pleural effusions or focal consolidations. No pneumothorax. Large body habitus. Osseous structures are nonsuspicious.   IMPRESSION: Stable cardiomegaly, increasing interstitial pulmonary edema.   Electronically Signed   By: Elon Alas   On: 07/01/2013 05:45    EKG Interpretation    Date/Time:  Tuesday July 01 2013 05:15:45 EST Ventricular Rate:  103 PR Interval:    QRS Duration: 120 QT Interval:  376 QTC Calculation: 492 R Axis:   36 Text Interpretation:  Atrial fibrillation with rapid ventricular response Low voltage QRS Right bundle branch block Abnormal ECG When compared with ECG of 17-Apr-2013 14:07, Nonspecific T wave abnormality now evident in Lateral leads Confirmed by Alexa Golebiewski  MD, Donnette Macmullen ([id number]) on 07/01/2013 5:30:52 AM            MDM   1. Dehydration   2. Hyperglycemia   3. Atrial fibrillation with RVR   4. Altered mental state    The patient is ill-appearing, she appears to have diffuse myoclonic jerking, she also appears to have a depressed mental status with slurred speech and appears mildly somnolent. She has been having increased urinary frequency which I suspect is related to the combination of her diuretic and her hyperglycemia and does appear to be dehydrated her mucous membranes. Laboratory workup to ensue, CT scan of the head as well.  She has been out of her medications including her carvedilol and her statin  Pt has essentially negative workup other than increased sugar, increased BUN consistent with a dehydrated state, chest x-ray without acute findings other than interstitial mild edema.  2 fibrillation likely secondary to dehydration  Discussed with hospitalist who will admit for observation and hydration.  Johnna Acosta, MD 07/01/13 [phone]

## 2013-07-01 NOTE — ED Notes (Signed)
Pt able to eat breakfast with assistance from daughter feeding her, pt tolerated well,

## 2013-07-01 NOTE — Consult Note (Signed)
CARDIOLOGY CONSULT NOTE   Patient ID: [name] MRN: [id number] DOB/AGE: 06-22-1940 73 y.o.  Admit Date: 07/01/2013 Referring Physician: PTH Primary Physician: Sherrie Mustache, MD Consulting Cardiologist: Kate Sable Primary Cardiologist: [facility] [place] Reason for Consultation: Afib RVR.   Clinical Summary [name] is a 73 y.o.female with known history of atrial fib, CAD medi mgt, CKD stage IV, DM, hypertension, CVA, frequent falls, and obesity, admitted with ataxia, confusion, atrial fib with RVR heart rate of 120 bpm, and acute CVA (new left inferomedial patchy hypodensity suggesting  subacute infarct in left posterior cerebral artery territory) per CT. Was also found to be hyperglycemic with BG at 261.    She lives with her son's who help manage her medications. She was taken to PCP yesterday due to weakness. She was told to continue her medications. On the way out of the door, she began to feel weakness in her legs. Later that evening at home, she felt cramping in her legs and worsening weakness. Upon getting up in the am she was feeling worse. Difficulty getting up and going to bathroom. When she got back to bed, she called 911.  Family at bedside states that she is always weak. They do not seem to be as informed on her current health condition.    She was admitted in November of 2014 with similar presentation. She was placed on carvedilol 12.5 mgBID. She was not placed on anticoagulation due to frequent falls and questionable compliance.    Allergies  Allergen Reactions  . Codeine Other (See Comments)    Childhood allergy    Medications Scheduled Medications: . aspirin  325 mg Oral Daily  . atorvastatin  40 mg Oral q1800  . carvedilol  12.5 mg Oral BID WC  . [START ON 07/02/2013] ferrous sulfate  325 mg Oral Q breakfast  . heparin  5,000 Units Subcutaneous Q8H  . [START ON 07/02/2013] influenza vac split quadrivalent PF  0.5 mL Intramuscular  Tomorrow-1000  . insulin aspart  0-9 Units Subcutaneous Q4H  . isosorbide mononitrate  30 mg Oral Daily  . nystatin   Topical BID  . [START ON 07/02/2013] pneumococcal 23 valent vaccine  0.5 mL Intramuscular Tomorrow-1000  . pregabalin  100 mg Oral QPM  . torsemide  20 mg Oral Daily  . [START ON 07/03/2013] Vitamin D (Ergocalciferol)  50,000 Units Oral Q Thu     PRN Medications:  ondansetron (ZOFRAN) IV, senna-docusate   Past Medical History  Diagnosis Date  . Diabetes mellitus   . Hypertension   . Hyperlipemia   . Fibromyalgia   . Obesity   . Sleep apnea     No CPAP  . Anxiety   . Hiatal hernia   . NSTEMI (non-ST elevated myocardial infarction) 04/27/2011  . Anemia 04/27/2011  . Restless leg syndrome   . CAD (coronary artery disease)     small dominant right coronary artery with diffuse 95% stenosis. The LAD had 70% stenosis in the mid vessel beyond the diagonal branch but was a small vessel. Circumflex had proximal 30% stenosis.   Marland Kitchen External hemorrhoids   . Arrhythmia 10/11/2011  . Community acquired pneumonia 06/24/2012  . Renal insufficiency 05/06/2011  . Shingles     Past Surgical History  Procedure Laterality Date  . Appendectomy    . Fracture surgery      left humerous  . Ganglion cyst removal      Family History  Problem Relation Age of Onset  . Stomach  cancer Mother   . Heart disease Maternal Grandmother   . Heart disease Maternal Grandfather     Social History [name] reports that she has never smoked. She has never used smokeless tobacco. [name] reports that she does not drink alcohol.  Review of Systems Otherwise reviewed and negative except as outlined.  Physical Examination Blood pressure 129/82, pulse 110, temperature 97.8 F (36.6 C), temperature source Oral, resp. rate 20, height 5\' 3"  (1.6 m), weight 248 lb 10.9 oz (112.8 kg), SpO2 96.00%.  Intake/Output Summary (Last 24 hours) at 07/01/13 1552 Last data filed at 07/01/13 1542  Gross  per 24 hour  Intake 1276.67 ml  Output    825 ml  Net 451.67 ml    Telemetry: Atrial fib rate of 103-110 bpm  HEENT: Conjunctiva and lids normal, oropharynx clear with moist mucosa. Obese. Neck: Supple, no elevated JVP or carotid bruits, no thyromegaly. Lungs: Clear to auscultation,poor respiratory effort. Cardiac: Iregular rate and rhythm, tachycardic, no S3 or significant systolic murmur, no pericardial rub. Abdomen: Soft, nontender, no hepatomegaly, bowel sounds present, no guarding or rebound. Extremities: No pitting edema, distal pulses 2+. Skin: Warm and dry. Musculoskeletal: No kyphosis. Neuropsychiatric: Alert and oriented x3, affect grossly appropriate. Neuro: Slurred speech with slow verbal responses. Generalized weakness.No focal deficits.  Prior Cardiac Testing/Procedures 1. Echo 2012 Left ventricle: The cavity size was normal. Wall thickness was increased in a pattern of mild LVH. Systolic function was normal. The estimated ejection fraction was in the range of 60% to 65%. Wall motion was normal; there were no regional wall motion abnormalities. Features are consistent with a pseudonormal left ventricular filling pattern, with concomitant abnormal relaxation and increased filling pressure (grade 2 diastolic dysfunction). E/medial e' > 15 suggests LV end diastolic pressure at least 20 mmHg. - Aortic valve: There was no stenosis. - Mitral valve: Moderately calcified annulus. - Left atrium: The atrium was mildly dilated. - Right ventricle: The cavity size was normal. Systolic function was normal. - Tricuspid valve: Peak RV-RA gradient: 37mm Hg (S). - Pulmonary arteries: PA systoilc pressure 34-38 mmHg. - Impressions: IVC measured 2.4 cm with < 50% respirophasic variation, suggesting RA pressure 11-15 mmHg.  2. Cardiac Cath 2012 Angiographic Findings:  Left main: Short segment, no disease.  Left Anterior Descending Artery: Diffuse calcification proximally with  mild obstructive disease. The vessel quickly tapers to a small caliber vessel in the mid segment beyond the small diagonal branch. There is a 70% stenosis in the mid vessel just beyond the diagonal branch. The vessel is approximately 1.75 mm in the mid segment and tapers even further in the distal vessel.  Circumflex Artery: Large dominant vessel with 30% proximal stenosis. Large trifurcating marginal with mild plaque disease.  Right Coronary Artery: Small, non-dominant vessel with diffuse 95% stenosis in the mid segment. (1.25 mm vessel).  Left Ventricular Angiogram: Not performed.  Impression:  1. Moderately severe stenosis in the small caliber mid LAD, too small for PCI.  2. Severe stenosis in small caliber non-dominant RCA, likely culprit. Too small for PCI.  3. Mild plaque Circumflex.  Recommendations:  Medical management at this time. Any intervention on the LAD would require a very small stent and possibly a rotablator for atherectomy. A drug eluting stent would be preferable but in this patient with anemia, a drug eluting stent would not be the best option. I am hopeful that we can control her symptoms with medications alone. Her RCA is too small for PCI.   Lab  Results  Basic Metabolic Panel:  Recent Labs Lab 07/01/13 0532 07/01/13 0554  NA 141 142  K 4.7 4.6  CL 106 111  CO2 20  --   GLUCOSE 261* 249*  BUN 88* 95*  CREATININE 2.68* 2.60*  CALCIUM 8.8  --   MG 2.3  --     Liver Function Tests:  Recent Labs Lab 07/01/13 0532  AST 29  ALT 45*  ALKPHOS 83  BILITOT 0.2*  PROT 7.1  ALBUMIN 2.9*    CBC:  Recent Labs Lab 07/01/13 0532 07/01/13 0554  WBC 6.8  --   NEUTROABS 3.9  --   HGB 10.1* 10.9*  HCT 30.8* 32.0*  MCV 95.1  --   PLT 154  --     Cardiac Enzymes:  Recent Labs Lab 07/01/13 0532  TROPONINI <0.30    BNP:  4028.0   Radiology: Ct Head Wo Contrast  07/01/2013   CLINICAL DATA:  Slurred speech and altered mental status. Weakness and  hyperglycemia.  EXAM: CT HEAD WITHOUT CONTRAST  TECHNIQUE:  IMPRESSION: New left inferomedial occipital patchy hypodensity suggesting subacute infarct in left posterior cerebral artery territory.  Involutional changes. Moderate white matter changes suggest chronic small vessel ischemic disease.  Findings discussed with and reconfirmed by Dr. Sabra Heck on July 01, 2013 at approximately 0650 hr.   Electronically Signed   By: Elon Alas   On: 07/01/2013 06:57   Mr [name] Head Wo Contrast  07/01/2013   CLINICAL DATA:  73 year old female with altered mental status and visual changes. Initial encounter. Abnormal MRI.  EXAM: MRA HEAD WITHOUT CONTRAST  TECHNIQUE:  IMPRESSION: 1. Degraded by motion. 2. Left PCA remains patent with moderate irregularity and P2 segment stenosis. 3. Advanced ICA siphon atherosclerosis. Cannot exclude moderate to severe bilateral supra clinoid ICA stenosis. Carotid termini remain patent.   Electronically Signed   By: Lars Pinks M.D.   On: 07/01/2013 12:01   Mr Brain Wo Contrast  07/01/2013   CLINICAL DATA:  73 year old female with slurred speech weakness confusion and altered mental status. Initial encounter.  EXAM: MRI HEAD WITHOUT CONTRAST  TECHNIQUE:.  IMPRESSION: 1. Subacute appearing left occipital lobe infarct with petechial hemorrhage, corresponding to the earlier CT finding. No associated mass effect. 2. No other acute intracranial abnormality. Stable underlying chronic small vessel disease.   Electronically Signed   By: Lars Pinks M.D.   On: 07/01/2013 11:58   Dg Chest Port 1 View  07/01/2013   CLINICAL DATA:  Stroke  EXAM: PORTABLE CHEST - 1 VIEW  COMPARISON:  Portable exam 1253 hr compared to 07/01/2013 at 0528 hr  FINDINGS: Enlargement of cardiac silhouette with pulmonary vascular congestion.  Mild perihilar edema consistent with CHF.  No pleural effusion or pneumothorax.  Bones unremarkable.  IMPRESSION: CHF, unchanged.   Electronically Signed   By: Lavonia Dana M.D.   On:  07/01/2013 14:30   Dg Chest Port 1 View  07/01/2013   CLINICAL DATA:  Chest pain, hyperglycemia and weakness.  EXAM: PORTABLE CHEST - 1 VIEW  COMPARISON:  Chest radiograph April 17, 2013  FINDINGS: Cardiac silhouette remains moderately enlarged, mediastinal silhouette is unremarkable. Increasing interstitial prominence with central pulmonary vasculature engorgement. No pleural effusions or focal consolidations. No pneumothorax. Large body habitus. Osseous structures are nonsuspicious.  IMPRESSION: Stable cardiomegaly, increasing interstitial pulmonary edema.   Electronically Signed   By: Elon Alas   On: 07/01/2013 05:45    [id number] fib rate of 103 bpm,  Impression and  Recommendations  1.Atrial fib with RVR: Question compliance. She states that her sons help her with her medications and she has been taking them. She has been restarted on carvedilol 12.5 mg BID with some HR control, but not optimal .BP is stable. Echo demonstrated normal EF with grade II diastolic dysfunction.She has been admitted for same in November of 2014.   Will increase carvedilol to 18.75 mg BID for better HR control. CHADs-Vasc score- 6. However, would  not start anticoagulation at this time due to frequent falls.  Echo is ordered for comparison.  May need to consider SNF and which time we can reconsider anticoagulation therapy in more controlled conditions. Family does not seem to be as attuned to her health status based on interview with son at bedside. Other family members were not available.   2.CAD: Significant stenosis of RCA per cardiac cath in 2012. Too small for intervention. Medical management recommended at that time.  2.SubAcute CVA: Abnormal CT head with associated confusion and worsening ataxia. She continues slurred speech and generalized weakness. She is able to respond to my questions but falls asleep easily and has some right sided weakness. Recommend neurology consult at your discretion.  4.  Hypertension: Essentially controlled currently on carvedilol, isosorbide, and torsemide.   5: CKD Stage IV. Creatinine 2.68 on admission. Slightly elevated from 2.56 on Nov 2014 discharge.   6 Diabetes: BG elevated on admission. Sliding scale insulin per PTH.  7. Anemia: Likely due to CKD.   8. CHF: Pulmonary edema. Continue current diuretics regimen.   Signed: Phill Myron. Purcell Nails NP [facility] 07/01/2013, 3:52 PM Co-Sign MD

## 2013-07-01 NOTE — ED Notes (Addendum)
Hospitalist Dr. Daleen Bo at bedside,

## 2013-07-01 NOTE — ED Notes (Signed)
Pt assisted to bedside toilet but she was very unsteady and seemed unsure how to move her limbs.

## 2013-07-01 NOTE — H&P (Addendum)
[facility] History and Physical  [name] PYK:[id number] DOB: 24-Sep-1940 DOA: 07/01/2013  Referring physician:  PCP: Sherrie Mustache, MD  Specialists:   Chief Complaint: slurred speech, gait ataxia   HPI: [name] is a 73 y.o. female with PMH of HTN, HPL, CAD, DM, h/o CVA presented with slurred speech, confusion, gait ataxia since yesterday; per family she had several days of gradual decline including the inability to ambulate safely, of note she had a fall a couple of weeks ago, she went to her family doctor yesterday for evaluation from the fall, not labs, or imaging studies was available; she is confused in ED, denies focal weakness, no chest pain, no SOB, no fever, chills, no n/v/d;  -ED work up showed subacute CVA, called d/w with Leonel Ramsay who recommended to cont CVA work up at [facility]: The patient denies anorexia, fever, weight loss,, vision loss, decreased hearing, hoarseness, chest pain, syncope, dyspnea on exertion, peripheral edema, balance deficits, hemoptysis, abdominal pain, melena, hematochezia, severe indigestion/heartburn, hematuria, incontinence, genital sores, muscle weakness, suspicious skin lesions, transient blindness, depression, unusual weight change, abnormal bleeding, enlarged lymph nodes, angioedema, and breast masses.   Past Medical History  Diagnosis Date  . Diabetes mellitus   . Hypertension   . Hyperlipemia   . Fibromyalgia   . Obesity   . Sleep apnea     No CPAP  . Anxiety   . Hiatal hernia   . NSTEMI (non-ST elevated myocardial infarction) 04/27/2011  . Anemia 04/27/2011  . Restless leg syndrome   . CAD (coronary artery disease)     small dominant right coronary artery with diffuse 95% stenosis. The LAD had 70% stenosis in the mid vessel beyond the diagonal branch but was a small vessel. Circumflex had proximal 30% stenosis.   Marland Kitchen External hemorrhoids   . Arrhythmia 10/11/2011  . Community acquired pneumonia 06/24/2012   . Renal insufficiency 05/06/2011  . Shingles    Past Surgical History  Procedure Laterality Date  . Appendectomy    . Fracture surgery      left humerous  . Ganglion cyst removal     Social History:  reports that she has never smoked. She has never used smokeless tobacco. She reports that she does not drink alcohol or use illicit drugs. Home;  where does patient live--home, ALF, SNF? and with whom if at home? No;  Can patient participate in ADLs?  Allergies  Allergen Reactions  . Codeine Other (See Comments)    Childhood allergy    Family History  Problem Relation Age of Onset  . Stomach cancer Mother   . Heart disease Maternal Grandmother   . Heart disease Maternal Grandfather     (be sure to complete)  Prior to Admission medications   Medication Sig Start Date End Date Taking? Authorizing Provider  carvedilol (COREG) 12.5 MG tablet Take 1 tablet (12.5 mg total) by mouth 2 (two) times daily with a meal. 04/18/13  Yes Lezlie Octave Black, NP  Cyanocobalamin 500 MCG SUBL Place 1 tablet under the tongue 2 (two) times daily.   Yes Historical Provider, MD  ferrous sulfate 325 (65 FE) MG tablet Take 325 mg by mouth daily with breakfast.   Yes Historical Provider, MD  glipiZIDE (GLUCOTROL) 10 MG tablet Take 10 mg by mouth daily before breakfast.   Yes Historical Provider, MD  insulin aspart (NOVOLOG FLEXPEN) 100 UNIT/ML SOPN FlexPen Inject 6 Units into the skin 3 (three) times daily with  meals.   Yes Historical Provider, MD  insulin glargine (LANTUS) 100 UNIT/ML injection Inject 45 Units into the skin daily.    Yes Historical Provider, MD  isosorbide mononitrate (IMDUR) 30 MG 24 hr tablet Take 30 mg by mouth daily.   Yes Historical Provider, MD  nitroGLYCERIN (NITROSTAT) 0.4 MG SL tablet Place 1 mg under the tongue every 5 (five) minutes x 3 doses as needed for chest pain. For chest pain   Yes Historical Provider, MD  nystatin (MYCOSTATIN/NYSTOP) 100000 UNIT/GM POWD Apply 1 g topically 2  (two) times daily. 04/18/13  Yes Lesle Chris Black, NP  pregabalin (LYRICA) 50 MG capsule Take 100 mg by mouth every evening.    Yes Historical Provider, MD  rosuvastatin (CRESTOR) 20 MG tablet Take 20 mg by mouth at bedtime.     Yes Historical Provider, MD  torsemide (DEMADEX) 20 MG tablet Take 2 tablets (40 mg total) by mouth daily. 11/05/12  Yes Gwenyth Bender, NP  Vitamin D, Ergocalciferol, (DRISDOL) 50000 UNITS CAPS capsule Take 50,000 Units by mouth every Thursday.   Yes Historical Provider, MD   Physical Exam: Filed Vitals:   07/01/13 0812  BP: 132/81  Pulse: 110  Temp:   Resp: 20     General:  Alert, but confused  Eyes: eom-i, perrla  ENT: no oral ulcers   Neck: supple   Cardiovascular: s1,s2 irregular   Respiratory: CTA BL  Abdomen: soft, nt, nd   Skin: no rash   Musculoskeletal: no LE edema   Psychiatric: no hallucinations   Neurologic: speech slurred otherwise CN 2-12 intact, confused to time, place; motor L side  UE 4/5; R 5/5  Labs on Admission:  Basic Metabolic Panel:  Recent Labs Lab 07/01/13 0532 07/01/13 0554  NA 141 142  K 4.7 4.6  CL 106 111  CO2 20  --   GLUCOSE 261* 249*  BUN 88* 95*  CREATININE 2.68* 2.60*  CALCIUM 8.8  --   MG 2.3  --    Liver Function Tests:  Recent Labs Lab 07/01/13 0532  AST 29  ALT 45*  ALKPHOS 83  BILITOT 0.2*  PROT 7.1  ALBUMIN 2.9*   No results found for this basename: LIPASE, AMYLASE,  in the last 168 hours  Recent Labs Lab 07/01/13 0532  AMMONIA 54   CBC:  Recent Labs Lab 07/01/13 0532 07/01/13 0554  WBC 6.8  --   NEUTROABS 3.9  --   HGB 10.1* 10.9*  HCT 30.8* 32.0*  MCV 95.1  --   PLT 154  --    Cardiac Enzymes:  Recent Labs Lab 07/01/13 0532  TROPONINI <0.30    BNP (last 3 results)  Recent Labs  10/06/12 2057 04/13/13 1325 07/01/13 0552  PROBNP 6749.0* 4581.0* 4028.0*   CBG: No results found for this basename: GLUCAP,  in the last 168 hours  Radiological Exams on  Admission: Ct Head Wo Contrast  07/01/2013   CLINICAL DATA:  Slurred speech and altered mental status. Weakness and hyperglycemia.  EXAM: CT HEAD WITHOUT CONTRAST  TECHNIQUE: Contiguous axial images were obtained from the base of the skull through the vertex without intravenous contrast.  COMPARISON:  CT of the head April 17, 2013. MRI of the brain April 17, 2013.  FINDINGS: The ventricles and sulci are normal for age. New left inferomedial occipital patchy hypodensity. No intraparenchymal hemorrhage, mass effect nor midline shift. Patchy to confluent supratentorial white matter hypodensities are within normal range for patient's age and though  non-specific suggest sequelae of chronic small vessel ischemic disease. No acute large vascular territory infarcts.  No abnormal extra-axial fluid collections. Basal cisterns are patent. Moderate calcific atherosclerosis of the carotid siphons and included vertebral arteries.  No skull fracture. Visualized paranasal sinuses and mastoid aircells are well-aerated. The included ocular globes and orbital contents are non-suspicious.  IMPRESSION: New left inferomedial occipital patchy hypodensity suggesting subacute infarct in left posterior cerebral artery territory.  Involutional changes. Moderate white matter changes suggest chronic small vessel ischemic disease.  Findings discussed with and reconfirmed by Dr. Sabra Heck on July 01, 2013 at approximately 0650 hr.   Electronically Signed   By: Elon Alas   On: 07/01/2013 06:57   Dg Chest Port 1 View  07/01/2013   CLINICAL DATA:  Chest pain, hyperglycemia and weakness.  EXAM: PORTABLE CHEST - 1 VIEW  COMPARISON:  Chest radiograph April 17, 2013  FINDINGS: Cardiac silhouette remains moderately enlarged, mediastinal silhouette is unremarkable. Increasing interstitial prominence with central pulmonary vasculature engorgement. No pleural effusions or focal consolidations. No pneumothorax. Large body habitus.  Osseous structures are nonsuspicious.  IMPRESSION: Stable cardiomegaly, increasing interstitial pulmonary edema.   Electronically Signed   By: Elon Alas   On: 07/01/2013 05:45    EKG: Independently reviewed. A fib   Assessment/Plan Principal Problem:   CVA (cerebral infarction) Active Problems:   CAD (coronary artery disease)   DM type 2, uncontrolled, with neuropathy   Slurred speech   Restless leg   Atrial fibrillation with rapid ventricular response  73 y.o. female with PMH of HTN, HPL, CAD, DM, CKD, h/o CVA presented with slurred speech, confusion, gait ataxia found to have subacute CVA, a fib    1. CVA subacute; likely embolic with a fib; CT: New left inferomedial occipital patchy hypodensity suggesting subacute infarct in left posterior cerebral artery territory -admit to complete CVA work up; start ASA, anticoagulation with a-fib timing defer to neurology, pend MRI   2. A fib new; hemodynamically stable;'  -cont BB, obtain echo, c/s cardiology if need TEE DCV;   3. DM, no recent HA1C:  -recheck HA1C; ISS, resume lantus after swallow test;    4. HTN permissive HTN with CVA; cont BB;   5. Chronic CHF; CXR: mild pulmonary congestion likely worse with progressive CKD;  -no respiratory compromise; echo (2012): LVEF 78%, grade 2 diastolic dysfunction  -cont PO BB, ASA, PO diuresis, not on ACE yet;    Neurology;  if consultant consulted, please document name and whether formally or informally consulted  Code Status: full (must indicate code status--if unknown or must be presumed, indicate so) Family Communication: d/w patient her son at the bedside; phone [phone] (indicate person spoken with, if applicable, with phone number if by telephone) Disposition Plan: pend clinical improvement  (indicate anticipated LOS)  Time spent: >35 minutes   Kinnie Feil [facility] Pager [phone]  If 7PM-7AM, please contact night-coverage [email or web address] Password  [facility] 07/01/2013, 8:23 AM

## 2013-07-01 NOTE — ED Notes (Signed)
Dr. Daleen Bo contacted, advised that pt has passed stroke swallow screen, V.O given for pt to have ada diet, diet tray ordered

## 2013-07-01 NOTE — ED Notes (Addendum)
Received report on pt, pt knows that she is in a hospital, thinks that it is [place], knows what city she lives in, knows her son at bedside, pt will talk to you then start "rambling" on another subject. Pt difficult to understand at times due to speech, son reports that pt's speech and confusion changed yesterday, was seen at [facility] in [place] for same symptoms yesterday according to son at bedside with no diagnosis, pt assisted to bedpan, has redness noted to abd folds and groin area,

## 2013-07-02 DIAGNOSIS — I369 Nonrheumatic tricuspid valve disorder, unspecified: Secondary | ICD-10-CM

## 2013-07-02 LAB — CBC
HEMATOCRIT: 28.5 % — AB (ref 36.0–46.0)
Hemoglobin: 9.5 g/dL — ABNORMAL LOW (ref 12.0–15.0)
MCH: 31.9 pg (ref 26.0–34.0)
MCHC: 33.3 g/dL (ref 30.0–36.0)
MCV: 95.6 fL (ref 78.0–100.0)
PLATELETS: 142 10*3/uL — AB (ref 150–400)
RBC: 2.98 MIL/uL — ABNORMAL LOW (ref 3.87–5.11)
RDW: 18.7 % — AB (ref 11.5–15.5)
WBC: 6.8 10*3/uL (ref 4.0–10.5)

## 2013-07-02 LAB — BASIC METABOLIC PANEL
BUN: 79 mg/dL — AB (ref 6–23)
CALCIUM: 8.5 mg/dL (ref 8.4–10.5)
CO2: 19 mEq/L (ref 19–32)
Chloride: 107 mEq/L (ref 96–112)
Creatinine, Ser: 2.66 mg/dL — ABNORMAL HIGH (ref 0.50–1.10)
GFR calc non Af Amer: 17 mL/min — ABNORMAL LOW (ref >90)
GFR, EST AFRICAN AMERICAN: 20 mL/min — AB (ref >90)
Glucose, Bld: 163 mg/dL — ABNORMAL HIGH (ref 70–99)
Potassium: 4.2 mEq/L (ref 3.7–5.3)
Sodium: 141 mEq/L (ref 137–147)

## 2013-07-02 LAB — GLUCOSE, CAPILLARY
GLUCOSE-CAPILLARY: 147 mg/dL — AB (ref 70–99)
GLUCOSE-CAPILLARY: 276 mg/dL — AB (ref 70–99)
GLUCOSE-CAPILLARY: 174 mg/dL — AB (ref 70–99)
Glucose-Capillary: 298 mg/dL — ABNORMAL HIGH (ref 70–99)

## 2013-07-02 LAB — LIPID PANEL
CHOL/HDL RATIO: 2 ratio
CHOLESTEROL: 96 mg/dL (ref 0–200)
HDL: 49 mg/dL (ref >39)
LDL Cholesterol: 34 mg/dL (ref 0–99)
TRIGLYCERIDES: 66 mg/dL (ref <150)
VLDL: 13 mg/dL (ref 0–40)

## 2013-07-02 LAB — HEMOGLOBIN A1C
HEMOGLOBIN A1C: 8.5 % — AB (ref <5.7)
MEAN PLASMA GLUCOSE: 197 mg/dL — AB (ref <117)

## 2013-07-02 LAB — AMMONIA: AMMONIA: 38 umol/L (ref 11–60)

## 2013-07-02 MED ORDER — INFLUENZA VAC SPLIT QUAD 0.5 ML IM SUSP
0.5000 mL | INTRAMUSCULAR | Status: AC
  Administered 2013-07-03: 0.5 mL via INTRAMUSCULAR
  Filled 2013-07-02: qty 0.5

## 2013-07-02 MED ORDER — PNEUMOCOCCAL VAC POLYVALENT 25 MCG/0.5ML IJ INJ
0.5000 mL | INJECTION | INTRAMUSCULAR | Status: AC
  Administered 2013-07-03: 0.5 mL via INTRAMUSCULAR
  Filled 2013-07-02: qty 0.5

## 2013-07-02 MED ORDER — INSULIN GLARGINE 100 UNIT/ML ~~LOC~~ SOLN
20.0000 [IU] | Freq: Every day | SUBCUTANEOUS | Status: DC
  Administered 2013-07-02: 20 [IU] via SUBCUTANEOUS
  Filled 2013-07-02 (×2): qty 0.2

## 2013-07-02 MED ORDER — ACETAMINOPHEN 325 MG PO TABS
650.0000 mg | ORAL_TABLET | Freq: Four times a day (QID) | ORAL | Status: DC | PRN
  Administered 2013-07-02: 650 mg via ORAL
  Filled 2013-07-02: qty 2

## 2013-07-02 MED ORDER — DILTIAZEM HCL ER COATED BEADS 120 MG PO CP24
120.0000 mg | ORAL_CAPSULE | Freq: Every day | ORAL | Status: DC
  Administered 2013-07-03: 120 mg via ORAL
  Filled 2013-07-02: qty 1

## 2013-07-02 NOTE — Consult Note (Addendum)
[place] A. Merlene Laughter, MD     [email or web address]          [name] is an 73 y.o. female.   ASSESSMENT/PLAN: 1. This patient presents with multiple symptoms ( Altered mental status, weakness of the legs, gait ataxia, tremors, and confusion) with no clear single unifying diagnosis. In my view, the findings seen on MRI seems more subacute to chronic and by itself is unlikely explain the patient's symptoms. The patient is clearly amnestic to why she is in the hospital. She also seemed to have significant flapping/asterixis suggestive of metabolic disturbance although there is no clear underlying metabolic disturbance to explain the patient's symptoms. The MRI finding could be due to chronic infarct with acute infarct or acute seizures. She does have renal insufficiency but this appears to be chronic. Her creatinine is at baseline. I will recommend that we reduce dose of medications. We may want to hold the Lyrica. An EEG will be obtained. Additional labs particularly ammonia level will be obtained.  2. 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 This is associated with chronic hemosiderin deposits again indicating its chronicity. In the setting of atrial fibrillation, this is undoubtedly cardioembolic. Anticoagulation long-term is recommended. She can be started right away.  This is a 73 year old white female who presents to the hospital with a 2 week history of overall deterioration in her problems. The history is obtained mostly from the chart and also from the patient. The patient is amnestic to why she is in the hospital and is not able to provide an adequate history. Per the records, the notes indicates that she presents with a two-week history of elevated significant downturn particularly in ability to ambulate. The patient herself reports that she fell twice in the last 2 weeks. She reports having leg weakness. The patient  also reports that she had tremors over the past 2 days. The patient does not recall how she came to the hospital or why. She does endorse is that her son stated that she did pass out. She does report having some mild headaches. She reports that she may have had some slurring of her speech episodically over the last couple days. She denies any dizziness or focal numbness although she reports weakness of the legs. She denies any chest pain or shortness of breath. The review of systems is otherwise negative although limited. The patient was not considered for IV TPA other treatment because of being outside of the window.  GENERAL: This is a morbidly obese lady in no acute distress. She does appears to be in some discomfort from her continuous flapping/asterixis.  HEENT: Supple. Atraumatic normocephalic.   ABDOMEN: soft  EXTREMITIES: No edema. There is marked arthritic changes of the knees bilaterally.  BACK: Normal.  SKIN: Normal by inspection.    MENTAL STATUS: Alert and oriented. Speech show some evidence of mild dysarthria. The language and cognition are generally Fine although insight is quite limited.   CRANIAL NERVES: Pupils are equal, round and reactive to light and accommodation; extra ocular movements are full, there is no significant nystagmus; visual fields are full; upper and lower facial muscles are normal in strength and symmetric, there is no flattening of the nasolabial folds; tongue is midline; uvula is midline; shoulder elevation is normal.  MOTOR: Normal tone, bulk and strength; no pronator drift.  COORDINATION: There is mark continuous extrasystole/flapping. There is no clear tremors with action or at rest. There is no akinesia  or rigidity. No dysmetria.; No bradykinesia.  REFLEXES: Deep tendon reflexes are symmetrical and normal except at the knees where they're 1+ but there are normal at the ankles. Babinski reflexes are flexor bilaterally.   SENSATION: Normal to light  touch.     The patient's creatinine levels are reviewed and she appears to be at baseline. Her level seems to run from 3.1 to 2.5 over the last several months.  The patient had CT scan and brain MRI scans are reviewed and person. There is a clear evidence of hypodensity involving the medial temporal occipital area on the left side. The brain MRI shows evidence of hypodensity involving the spin echo gradient sequence suggestive of chronic hemosiderin deposit. In the same area there is increased signal on the FLAIR imaging. Hypodense area seen on the hemosiderin by describing of the overall area in question. There is a right thalamic lacunar infarct. 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     Past Medical History  Diagnosis Date  . Diabetes mellitus   . Hypertension   . Hyperlipemia   . Fibromyalgia   . Obesity   . Sleep apnea     No CPAP  . Anxiety   . Hiatal hernia   . NSTEMI (non-ST elevated myocardial infarction) 04/27/2011  . Anemia 04/27/2011  . Restless leg syndrome   . CAD (coronary artery disease)     small dominant right coronary artery with diffuse 95% stenosis. The LAD had 70% stenosis in the mid vessel beyond the diagonal branch but was a small vessel. Circumflex had proximal 30% stenosis.   Marland Kitchen External hemorrhoids   . Arrhythmia 10/11/2011  . Community acquired pneumonia 06/24/2012  . Renal insufficiency 05/06/2011  . Shingles     Past Surgical History  Procedure Laterality Date  . Appendectomy    . Fracture surgery      left humerous  . Ganglion cyst removal      Family History  Problem Relation Age of Onset  . Stomach cancer Mother   . Heart disease Maternal Grandmother   . Heart disease Maternal Grandfather     Social History:  reports that she has never smoked. She has never used smokeless tobacco. She reports that she does not drink alcohol or use illicit drugs.  Allergies:    Allergies  Allergen Reactions  . Codeine Other (See Comments)    Childhood allergy    Medications: Prior to Admission medications   Medication Sig Start Date End Date Taking? Authorizing Provider  aspirin EC 81 MG tablet Take 81 mg by mouth daily.   Yes Historical Provider, MD  atorvastatin (LIPITOR) 80 MG tablet Take 80 mg by mouth at bedtime.   Yes Historical Provider, MD  carvedilol (COREG) 6.25 MG tablet Take 6.25 mg by mouth 2 (two) times daily with a meal.   Yes Historical Provider, MD  Cyanocobalamin 500 MCG SUBL Place 1 tablet under the tongue daily.   Yes Historical Provider, MD  ferrous sulfate 325 (65 FE) MG tablet Take 325 mg by mouth 2 (two) times daily.   Yes Historical Provider, MD  fluocinonide cream (LIDEX) 5.57 % Apply 1 application topically 2 (two) times daily.   Yes Historical Provider, MD  Insulin Glargine (LANTUS SOLOSTAR) 100 UNIT/ML Solostar Pen Inject 40 Units into the skin at bedtime.   Yes Historical Provider, MD  insulin lispro (HUMALOG KWIKPEN) 100 UNIT/ML KiwkPen Inject 10 Units into the skin 3 (three)  times daily with meals.   Yes Historical Provider, MD  isosorbide mononitrate (IMDUR) 30 MG 24 hr tablet Take 30 mg by mouth daily.   Yes Historical Provider, MD  pregabalin (LYRICA) 150 MG capsule Take 150 mg by mouth 2 (two) times daily.   Yes Historical Provider, MD  torsemide (DEMADEX) 20 MG tablet Take 2 tablets (40 mg total) by mouth daily. 11/05/12  Yes Lezlie Octave Black, NP  valACYclovir (VALTREX) 1000 MG tablet Take 1,000 mg by mouth 3 (three) times daily. For 7 days   Yes Historical Provider, MD  Vitamin D, Ergocalciferol, (DRISDOL) 50000 UNITS CAPS capsule Take 50,000 Units by mouth every Thursday.   Yes Historical Provider, MD  nitroGLYCERIN (NITROSTAT) 0.4 MG SL tablet Place 1 mg under the tongue every 5 (five) minutes x 3 doses as needed for chest pain. For chest pain    Historical Provider, MD     Scheduled Meds: . aspirin  325 mg Oral Daily  .  atorvastatin  40 mg Oral q1800  . carvedilol  12.5 mg Oral BID WC  . diltiazem  30 mg Oral Q8H  . ferrous sulfate  325 mg Oral Q breakfast  . heparin  5,000 Units Subcutaneous Q8H  . influenza vac split quadrivalent PF  0.5 mL Intramuscular Tomorrow-1000  . insulin aspart  0-9 Units Subcutaneous TID WC  . isosorbide mononitrate  30 mg Oral Daily  . nystatin   Topical BID  . pneumococcal 23 valent vaccine  0.5 mL Intramuscular Tomorrow-1000  . pregabalin  100 mg Oral QPM  . torsemide  20 mg Oral Daily  . [START ON 07/03/2013] Vitamin D (Ergocalciferol)  50,000 Units Oral Q Thu   Continuous Infusions:  PRN Meds:.senna-docusate    Blood pressure 133/82, pulse 78, temperature 97.4 F (36.3 C), temperature source Oral, resp. rate 20, height 5' 3"  (1.6 m), weight 112.8 kg (248 lb 10.9 oz), SpO2 97.00%.   Results for orders placed during the hospital encounter of 07/01/13 (from the past 48 hour(s))  URINALYSIS, ROUTINE W REFLEX MICROSCOPIC     Status: Abnormal   Collection Time    07/01/13  5:29 AM      Result Value Range   Color, Urine YELLOW  YELLOW   APPearance CLEAR  CLEAR   Specific Gravity, Urine 1.020  1.005 - 1.030   pH 5.0  5.0 - 8.0   Glucose, UA NEGATIVE  NEGATIVE mg/dL   Hgb urine dipstick NEGATIVE  NEGATIVE   Bilirubin Urine NEGATIVE  NEGATIVE   Ketones, ur NEGATIVE  NEGATIVE mg/dL   Protein, ur 30 (*) NEGATIVE mg/dL   Urobilinogen, UA 0.2  0.0 - 1.0 mg/dL   Nitrite NEGATIVE  NEGATIVE   Leukocytes, UA NEGATIVE  NEGATIVE  URINE MICROSCOPIC-ADD ON     Status: None   Collection Time    07/01/13  5:29 AM      Result Value Range   Squamous Epithelial / LPF RARE  RARE   WBC, UA 0-2  <3 WBC/hpf  LACTIC ACID, PLASMA     Status: None   Collection Time    07/01/13  5:31 AM      Result Value Range   Lactic Acid, Venous 1.1  0.5 - 2.2 mmol/L  TSH     Status: None   Collection Time    07/01/13  5:31 AM      Result Value Range   TSH 2.435  0.350 - 4.500 uIU/mL    Comment: Performed at [facility]  COMPREHENSIVE METABOLIC PANEL     Status: Abnormal   Collection Time    07/01/13  5:32 AM      Result Value Range   Sodium 141  137 - 147 mEq/L   Potassium 4.7  3.7 - 5.3 mEq/L   Chloride 106  96 - 112 mEq/L   CO2 20  19 - 32 mEq/L   Glucose, Bld 261 (*) 70 - 99 mg/dL   BUN 88 (*) 6 - 23 mg/dL   Creatinine, Ser 2.68 (*) 0.50 - 1.10 mg/dL   Calcium 8.8  8.4 - 10.5 mg/dL   Total Protein 7.1  6.0 - 8.3 g/dL   Albumin 2.9 (*) 3.5 - 5.2 g/dL   AST 29  0 - 37 U/L   ALT 45 (*) 0 - 35 U/L   Alkaline Phosphatase 83  39 - 117 U/L   Total Bilirubin 0.2 (*) 0.3 - 1.2 mg/dL   GFR calc non Af Amer 17 (*) >90 mL/min   GFR calc Af Amer 19 (*) >90 mL/min   Comment: (NOTE)     The eGFR has been calculated using the CKD EPI equation.     This calculation has not been validated in all clinical situations.     eGFR's persistently <90 mL/min signify possible Chronic Kidney     Disease.  CBC WITH DIFFERENTIAL     Status: Abnormal   Collection Time    07/01/13  5:32 AM      Result Value Range   WBC 6.8  4.0 - 10.5 K/uL   RBC 3.24 (*) 3.87 - 5.11 MIL/uL   Hemoglobin 10.1 (*) 12.0 - 15.0 g/dL   HCT 30.8 (*) 36.0 - 46.0 %   MCV 95.1  78.0 - 100.0 fL   MCH 31.2  26.0 - 34.0 pg   MCHC 32.8  30.0 - 36.0 g/dL   RDW 18.6 (*) 11.5 - 15.5 %   Platelets 154  150 - 400 K/uL   Neutrophils Relative % 57  43 - 77 %   Neutro Abs 3.9  1.7 - 7.7 K/uL   Lymphocytes Relative 28  12 - 46 %   Lymphs Abs 1.9  0.7 - 4.0 K/uL   Monocytes Relative 7  3 - 12 %   Monocytes Absolute 0.5  0.1 - 1.0 K/uL   Eosinophils Relative 7 (*) 0 - 5 %   Eosinophils Absolute 0.5  0.0 - 0.7 K/uL   Basophils Relative 0  0 - 1 %   Basophils Absolute 0.0  0.0 - 0.1 K/uL  AMMONIA     Status: None   Collection Time    07/01/13  5:32 AM      Result Value Range   Ammonia 54  11 - 60 umol/L  TROPONIN I     Status: None   Collection Time    07/01/13  5:32 AM      Result Value Range    Troponin I <0.30  <0.30 ng/mL   Comment:            Due to the release kinetics of cTnI,     a negative result within the first hours     of the onset of symptoms does not rule out     myocardial infarction with certainty.     If myocardial infarction is still suspected,     repeat the test at appropriate intervals.  MAGNESIUM     Status: None   Collection  Time    07/01/13  5:32 AM      Result Value Range   Magnesium 2.3  1.5 - 2.5 mg/dL  PRO B NATRIURETIC PEPTIDE     Status: Abnormal   Collection Time    07/01/13  5:52 AM      Result Value Range   Pro B Natriuretic peptide (BNP) 4028.0 (*) 0 - 125 pg/mL  HEMOGLOBIN A1C     Status: Abnormal   Collection Time    07/01/13  5:52 AM      Result Value Range   Hemoglobin A1C 8.7 (*) <5.7 %   Comment: (NOTE)                                                                               According to the ADA Clinical Practice Recommendations for 2011, when     HbA1c is used as a screening test:      >=6.5%   Diagnostic of Diabetes Mellitus               (if abnormal result is confirmed)     5.7-6.4%   Increased risk of developing Diabetes Mellitus     References:Diagnosis and Classification of Diabetes Mellitus,Diabetes     UXLK,4401,02(VOZDG 1):S62-S69 and Standards of Medical Care in             Diabetes - 2011,Diabetes Care,2011,34 (Suppl 1):S11-S61.     CORRECTED ON 01/27 AT 1957: PREVIOUSLY REPORTED AS 8.7 Reference range: <5.7   Mean Plasma Glucose 203 (*) <117 mg/dL   Comment: Performed at [facility], [facility]     Status: Abnormal   Collection Time    07/01/13  5:54 AM      Result Value Range   Sodium 142  137 - 147 mEq/L   Potassium 4.6  3.7 - 5.3 mEq/L   Chloride 111  96 - 112 mEq/L   BUN 95 (*) 6 - 23 mg/dL   Creatinine, Ser 2.60 (*) 0.50 - 1.10 mg/dL   Glucose, Bld 249 (*) 70 - 99 mg/dL   Calcium, Ion 1.21  1.13 - 1.30 mmol/L   TCO2 19  0 - 100 mmol/L   Hemoglobin 10.9 (*) 12.0 - 15.0 g/dL   HCT  32.0 (*) 36.0 - 46.0 %  GLUCOSE, CAPILLARY     Status: Abnormal   Collection Time    07/01/13 10:25 AM      Result Value Range   Glucose-Capillary 164 (*) 70 - 99 mg/dL  GLUCOSE, CAPILLARY     Status: Abnormal   Collection Time    07/01/13  4:56 PM      Result Value Range   Glucose-Capillary 204 (*) 70 - 99 mg/dL   Comment 1 Notify RN     Comment 2 Documented in Chart    GLUCOSE, CAPILLARY     Status: Abnormal   Collection Time    07/01/13  9:01 PM      Result Value Range   Glucose-Capillary 203 (*) 70 - 99 mg/dL  LIPID PANEL     Status: None   Collection Time    07/02/13  5:01 AM  Result Value Range   Cholesterol 96  0 - 200 mg/dL   Triglycerides 66  <150 mg/dL   HDL 49  >39 mg/dL   Total CHOL/HDL Ratio 2.0     VLDL 13  0 - 40 mg/dL   LDL Cholesterol 34  0 - 99 mg/dL   Comment:            Total Cholesterol/HDL:CHD Risk     Coronary Heart Disease Risk Table                         Men   Women      1/2 Average Risk   3.4   3.3      Average Risk       5.0   4.4      2 X Average Risk   9.6   7.1      3 X Average Risk  23.4   11.0                Use the calculated Patient Ratio     above and the CHD Risk Table     to determine the patient's CHD Risk.                ATP III CLASSIFICATION (LDL):      <100     mg/dL   Optimal      100-129  mg/dL   Near or Above                        Optimal      130-159  mg/dL   Borderline      160-189  mg/dL   High      >190     mg/dL   Very High  CBC     Status: Abnormal   Collection Time    07/02/13  5:01 AM      Result Value Range   WBC 6.8  4.0 - 10.5 K/uL   RBC 2.98 (*) 3.87 - 5.11 MIL/uL   Hemoglobin 9.5 (*) 12.0 - 15.0 g/dL   HCT 28.5 (*) 36.0 - 46.0 %   MCV 95.6  78.0 - 100.0 fL   MCH 31.9  26.0 - 34.0 pg   MCHC 33.3  30.0 - 36.0 g/dL   RDW 18.7 (*) 11.5 - 15.5 %   Platelets 142 (*) 150 - 400 K/uL  BASIC METABOLIC PANEL     Status: Abnormal   Collection Time    07/02/13  5:01 AM      Result Value Range   Sodium  141  137 - 147 mEq/L   Potassium 4.2  3.7 - 5.3 mEq/L   Chloride 107  96 - 112 mEq/L   CO2 19  19 - 32 mEq/L   Glucose, Bld 163 (*) 70 - 99 mg/dL   BUN 79 (*) 6 - 23 mg/dL   Creatinine, Ser 2.66 (*) 0.50 - 1.10 mg/dL   Calcium 8.5  8.4 - 10.5 mg/dL   GFR calc non Af Amer 17 (*) >90 mL/min   GFR calc Af Amer 20 (*) >90 mL/min   Comment: (NOTE)     The eGFR has been calculated using the CKD EPI equation.     This calculation has not been validated in all clinical situations.     eGFR's persistently <90 mL/min signify possible Chronic Kidney     Disease.  GLUCOSE, CAPILLARY  Status: Abnormal   Collection Time    07/02/13  7:53 AM      Result Value Range   Glucose-Capillary 147 (*) 70 - 99 mg/dL    Ct Head Wo Contrast  07/01/2013   CLINICAL DATA:  Slurred speech and altered mental status. Weakness and hyperglycemia.  EXAM: CT HEAD WITHOUT CONTRAST  TECHNIQUE: Contiguous axial images were obtained from the base of the skull through the vertex without intravenous contrast.  COMPARISON:  CT of the head April 17, 2013. MRI of the brain April 17, 2013.  FINDINGS: The ventricles and sulci are normal for age. New left inferomedial occipital patchy hypodensity. No intraparenchymal hemorrhage, mass effect nor midline shift. Patchy to confluent supratentorial white matter hypodensities are within normal range for patient's age and though non-specific suggest sequelae of chronic small vessel ischemic disease. No acute large vascular territory infarcts.  No abnormal extra-axial fluid collections. Basal cisterns are patent. Moderate calcific atherosclerosis of the carotid siphons and included vertebral arteries.  No skull fracture. Visualized paranasal sinuses and mastoid aircells are well-aerated. The included ocular globes and orbital contents are non-suspicious.  IMPRESSION: New left inferomedial occipital patchy hypodensity suggesting subacute infarct in left posterior cerebral artery territory.   Involutional changes. Moderate white matter changes suggest chronic small vessel ischemic disease.  Findings discussed with and reconfirmed by Dr. Sabra Heck on July 01, 2013 at approximately 0650 hr.   Electronically Signed   By: Elon Alas   On: 07/01/2013 06:57   Mr [name] Head Wo Contrast  07/01/2013   CLINICAL DATA:  73 year old female with altered mental status and visual changes. Initial encounter. Abnormal MRI.  EXAM: MRA HEAD WITHOUT CONTRAST  TECHNIQUE: Angiographic images of the Circle of Willis were obtained using MRA technique without intravenous contrast.  COMPARISON:  Brain MRI from the same day reported separately.  FINDINGS: Study is intermittently degraded by motion artifact despite repeated imaging attempts.  Antegrade flow in the posterior circulation, dominant distal right vertebral artery. Vertebrobasilar junction is patent. Both PICA origins appear patent. Basilar artery is patent without stenosis. SCA and PCA origins are patent.  There is mild left PCA P2 segment irregularity with up to moderate focal stenosis (series 100 in 10 image 28). Preserved bilateral distal PCA flow. Posterior communicating arteries are diminutive or absent.  Antegrade flow in both ICA siphons. Cavernous and supra clinoid ICA irregularity exacerbated by motion artifact. Cannot exclude moderate to severe bilateral anterior genu stenosis.  Patent carotid termini. Dominant right ACA A1 segment. MCA origins are within normal limits. Bilateral MCA and ACA branch detail degraded by motion. No major MCA or ACA branch occlusion. Anterior communicating artery within normal limits.  IMPRESSION: 1. Degraded by motion. 2. Left PCA remains patent with moderate irregularity and P2 segment stenosis. 3. Advanced ICA siphon atherosclerosis. Cannot exclude moderate to severe bilateral supra clinoid ICA stenosis. Carotid termini remain patent.   Electronically Signed   By: Lars Pinks M.D.   On: 07/01/2013 12:01   Mr Brain Wo  Contrast  07/01/2013   CLINICAL DATA:  73 year old female with slurred speech weakness confusion and altered mental status. Initial encounter.  EXAM: MRI HEAD WITHOUT CONTRAST  TECHNIQUE: Multiplanar, multiecho pulse sequences of the brain and surrounding structures were obtained without intravenous contrast.  COMPARISON:  Head CT without contrast 0630 hr the same day. Brain MRI 04/17/2013.  FINDINGS: Study is repeat intermittently degraded by motion artifact despite repeated imaging attempts.  Heterogeneous diffusion in the left occipital lobe inferiorly corresponding  to the hypodense abnormality on the recent CT. Little if any restricted diffusion in this area. T2* images demonstrate petechial hemorrhage (series 7, image 20). Associated patchy T2 and FLAIR hyperintensity in this region. No significant mass effect.  No restricted diffusion elsewhere. Major intracranial vascular flow voids are preserved.  Additional patchy and confluent cerebral white matter T2 and FLAIR hyperintensity. Chronic small lacunar infarcts in the thalami, more so the right. No midline shift, mass effect, or evidence of intracranial mass lesion. No ventriculomegaly. Negative pituitary, cervicomedullary junction and visualized cervical spine. Normal bone marrow signal.  Visualized orbit soft tissues are within normal limits. Visualized paranasal sinuses and mastoids are clear. Visualized scalp soft tissues are within normal limits.  IMPRESSION: 1. Subacute appearing left occipital lobe infarct with petechial hemorrhage, corresponding to the earlier CT finding. No associated mass effect. 2. No other acute intracranial abnormality. Stable underlying chronic small vessel disease.   Electronically Signed   By: Lars Pinks M.D.   On: 07/01/2013 11:58   Dg Chest Port 1 View  07/01/2013   CLINICAL DATA:  Stroke  EXAM: PORTABLE CHEST - 1 VIEW  COMPARISON:  Portable exam 1253 hr compared to 07/01/2013 at 0528 hr  FINDINGS: Enlargement of cardiac  silhouette with pulmonary vascular congestion.  Mild perihilar edema consistent with CHF.  No pleural effusion or pneumothorax.  Bones unremarkable.  IMPRESSION: CHF, unchanged.   Electronically Signed   By: Lavonia Dana M.D.   On: 07/01/2013 14:30   Dg Chest Port 1 View  07/01/2013   CLINICAL DATA:  Chest pain, hyperglycemia and weakness.  EXAM: PORTABLE CHEST - 1 VIEW  COMPARISON:  Chest radiograph April 17, 2013  FINDINGS: Cardiac silhouette remains moderately enlarged, mediastinal silhouette is unremarkable. Increasing interstitial prominence with central pulmonary vasculature engorgement. No pleural effusions or focal consolidations. No pneumothorax. Large body habitus. Osseous structures are nonsuspicious.  IMPRESSION: Stable cardiomegaly, increasing interstitial pulmonary edema.   Electronically Signed   By: Elon Alas   On: 07/01/2013 05:45        [name] A. Merlene Laughter, M.D.  Diplomate, Tax adviser of Psychiatry and Neurology ( Neurology). 07/02/2013, 8:22 AM

## 2013-07-02 NOTE — Progress Notes (Signed)
*  PRELIMINARY RESULTS* Echocardiogram 2D Echocardiogram has been performed.  [place], [place] 07/02/2013, 10:54 AM

## 2013-07-02 NOTE — Care Management Note (Addendum)
    Page 1 of 1   07/03/2013     12:32:36 PM   CARE MANAGEMENT NOTE 07/03/2013  Patient:  [name], [name]   Account Number:  [id number]  Date Initiated:  07/02/2013  Documentation initiated by:  Theophilus Kinds  Subjective/Objective Assessment:   Pt admitted from home with CVA. Pt lives with 3 sons who are not very active in the care of the pt. Pt is active with [facility]. Pt has w/c for home use.     Action/Plan:   PT recommends SNF at discharge. Pt is agreeable. CSW initiated bed search. Pt would like [facility]. Will continue to follow for any discharge planning needs.   Anticipated DC Date:  07/04/2013   Anticipated DC Plan:  SKILLED NURSING FACILITY  In-house referral  Clinical Social Worker      DC Planning Services  CM consult      Choice offered to / List presented to:             Status of service:  Completed, signed off Medicare Important Message given?  NA - LOS <3 / Initial given by admissions (If response is "NO", the following Medicare IM given date fields will be blank) Date Medicare IM given:   Date Additional Medicare IM given:    Discharge Disposition:  [place]  Per UR Regulation:    If discussed at Long Length of Stay Meetings, dates discussed:    Comments:  07/03/13 [place], RN BSN CM PT discharged to [facility] today. CSW to arrange discharge to facility.  07/02/13 [place], RN BSN CM

## 2013-07-02 NOTE — Progress Notes (Signed)
Inpatient Diabetes Program Recommendations  AACE/ADA: New Consensus Statement on Inpatient Glycemic Control (2013)  Target Ranges:  Prepandial:   less than 140 mg/dL      Peak postprandial:   less than 180 mg/dL (1-2 hours)      Critically ill patients:  140 - 180 mg/dL   Results for [name], [name] (MRN [id number]) as of 07/02/2013 15:52  Ref. Range 07/01/2013 10:25 07/01/2013 16:56 07/01/2013 21:01 07/02/2013 07:53 07/02/2013 11:38  Glucose-Capillary Latest Range: 70-99 mg/dL 164 (H) 204 (H) 203 (H) 147 (H) 276 (H)    Diabetes history: DM2 Outpatient Diabetes medications: Lantus 40 units QHS and Humalog 10 units TID with meals Current orders for Inpatient glycemic control: Latnus 20 units QSH, Novolog 0-9 units TID with meals  Inpatient Diabetes Program Recommendations Insulin - Basal: Noted patient will be starting on half of home dose of Lantus so pt will receive Lantus 20 units QHS.  Agree with starting on half of home dose of Lantus as diabetes coordinator would have recommended Lantus 20 units based on pt weight in kg x 0.2 units (22.56 units). Correction (SSI): Novolog 0-9 units TID with meals is ordered for correction. Insulin - Meal Coverage: May want to consider ordering Novolog 4 units TID with meals for meal coverage (if pt eats at least 50% of meal).  Note: Spoke with patient about diabetes and home regimen for diabetes control. According to the patient her PCP helps her manage her diabetes.  She reports that she takes Lantus 40 units QHS and Humalog 10 units TID as an outpatient for diabetes control.  She reports that she checks her blood sugar 2-3 times a day and it fluctuates from 70's to 400's mg/dl and sometimes it is "HI" on her glucometer.  She states that she keeps a written record of her blood sugars and takes it with her to PCP visits.  Inquired about knowledge on A1C and patient reports that she does not know what an A1C is and far as she can remember her doctor has not talked to  her about an A1C before.  Explained what an A1C is and discussed A1C results (8.7% on 07/01/13).  Discussed basic pathophysiology of DM Type 2, basic home care, importance of checking CBGs and maintaining good CBG control to prevent long-term and short-term complications. Discussed impact of nutrition, exercise, stress, and sickness on diabetes control.  Patient reports that she follows a diabetic diet and she eats three meals a day.  Patient verbalized understanding of information discussed and reports that she has no further questions related to diabetes at this time.  Thanks, Barnie Alderman, RN, MSN, CCRN Diabetes Coordinator Inpatient Diabetes Program [phone] (Team Pager) [phone] ([facility] office) [phone] [facility] office)

## 2013-07-02 NOTE — Evaluation (Signed)
Occupational Therapy Evaluation Patient Details Name: [name] MRN: [id number] DOB: [birth year] Today's Date: 07/02/2013 Time: 3235-5732 OT Time Calculation (min): 36 min Eval 854-930 (30')  OT Assessment / Plan / Recommendation History of present illness [name] is a 73 y.o. female with PMH of HTN, HPL, CAD, DM, h/o CVA presented with slurred speech, confusion, gait ataxia since yesterday; per family she had several days of gradual decline including the inability to ambulate safely, of note she had a fall a couple of Weeks ago, she went to her family doctor for evaluation from the fall   Clinical Impression   Pt is currently presenting at a min guard level for upper body ADLs. She lives at home with hr three sons, who are available to assist as needed. Pt will benefit from skilled OT services for strengthening, ADL safety, and activity tolerance.    OT Assessment  Patient needs continued OT Services    Follow Up Recommendations  SNF;Home health OT       Equipment Recommendations  Tub/shower seat       Frequency  Min 2X/week    Precautions / Restrictions Precautions Precautions: Fall Restrictions Weight Bearing Restrictions: No   Pertinent Vitals/Pain Pt denied pain.    ADL  Eating/Feeding: Set up (Pt set up with food tray by nursing - per report required no assist.) Where Assessed - Eating/Feeding: Bed level Grooming: Supervision/safety Where Assessed - Grooming: Supported sitting Toilet Transfer: Simulated;Min guard (verbal cueing for hand placement and safety) Toilet Transfer Method: Sit to stand;Stand pivot Toilet Transfer Equipment: Grab bars;Regular height toilet ADL Comments: Pt reports independence in ADLs prior to admission. At current level, pt requires assist for balance and safety.    OT Diagnosis: Generalized weakness;Ataxia  OT Problem List: Decreased strength;Decreased activity tolerance;Impaired balance (sitting and/or standing);Decreased  coordination;Decreased safety awareness;Cardiopulmonary status limiting activity OT Treatment Interventions: Self-care/ADL training;Therapeutic exercise;Energy conservation;DME and/or AE instruction;Therapeutic activities;Patient/family education;Balance training   OT Goals(Current goals can be found in the care plan section) Acute Rehab OT Goals Patient Stated Goal: I want to go home. OT Goal Formulation: With patient Time For Goal Achievement: 07/16/13 Potential to Achieve Goals: Good ADL Goals Pt Will Perform Grooming: with set-up;standing;with adaptive equipment (at the sink) Pt Will Perform Lower Body Dressing: with adaptive equipment;sit to/from stand (LBD to be assessed- pt to be </= modA) Pt/caregiver will Perform Home Exercise Program: Increased strength;Both right and left upper extremity;With Supervision  Visit Information  Last OT Received On: 07/02/13 Assistance Needed: +1 PT/OT/SLP Co-Evaluation/Treatment: Yes (Pt present for mobility portion of evaluation) History of Present Illness: [name] is a 73 y.o. female with PMH of HTN, HPL, CAD, DM, h/o CVA presented with slurred speech, confusion, gait ataxia since yesterday; per family she had several days of gradual decline including the inability to ambulate safely, of note she had a fall a couple of Weeks ago, she went to her family doctor for evaluation from the fall       Prior [place] expects to be discharged to:: Private residence Living Arrangements: Children (3 adult sons) Available Help at Discharge: Family;Available PRN/intermittently Type of Home: House Home Access: Ramped entrance Home Layout: One level Home Equipment: Walker - 2 wheels;Wheelchair - manual;Bedside commode (Pt previously had shower seat, but it is borken. Currenlty using a small stool. Pt also has BSC.) Additional Comments: patient has shower chair, RW, BSC at home  Prior Function Level of Independence:  Needs assistance  ADL's / Homemaking Assistance Needed: Pt received assist from sons for all IADLs. Pt was independent in ADL tasks, but one of her sons was always nearby 'just in case.' Comments: Pt states she will go out to [employer] and will [place] a cart as long as possible and then she will go to a motorized chair. Communication Communication: Expressive difficulties Dominant Hand: Right    Vision/Perception Vision - History Baseline Vision: Wears glasses all the time Patient Visual Report: No change from baseline;Diplopia (Pt did not have glasses at eval - inicated that diplopia/blurring was typical and corrected with glasses) Vision - Assessment Eye Alignment: Within Functional Limits   Cognition  Cognition Arousal/Alertness: Awake/alert Behavior During Therapy: WFL for tasks assessed/performed Overall Cognitive Status: Within Functional Limits for tasks assessed    Extremity/Trunk Assessment Upper Extremity Assessment Upper Extremity Assessment: Generalized weakness Lower Extremity Assessment Lower Extremity Assessment: Defer to PT evaluation     Mobility Bed Mobility Overal bed mobility: Needs Assistance Bed Mobility: Supine to Sit Supine to sit: Mod assist Transfers Overall transfer level: Needs assistance Equipment used: Rolling walker (2 wheeled) Transfers: Sit to/from [facility] Sit to Stand: Min guard Stand pivot transfers: Min guard      Balance Balance Overall balance assessment: History of Falls;Needs assistance Sitting-balance support: Bilateral upper extremity supported;Feet supported Sitting balance-Leahy Scale: Poor Standing balance support: Bilateral upper extremity supported Standing balance-Leahy Scale: Good   End of Session OT - End of Session Equipment Utilized During Treatment: Gait belt;Rolling walker;Oxygen Activity Tolerance: Patient tolerated treatment well Patient left: in chair;with call bell/phone within reach;with chair alarm  set  Bea Graff, [place], OTR/L [phone]  07/02/2013, 10:31 AM

## 2013-07-02 NOTE — Evaluation (Signed)
Physical Therapy Evaluation Patient Details Name: [name] MRN: [id number] DOB: [birth year] Today's Date: 07/02/2013 Time: 0920-0940 PT Time Calculation (min): 20 min  PT Assessment / Plan / Recommendation History of Present Illness  [name] is a 73 y.o. female with PMH of HTN, HPL, CAD, DM, h/o CVA presented with slurred speech, confusion, gait ataxia since yesterday; per family she had several days of gradual decline including the inability to ambulate safely, of note she had a fall a couple of weeks ago, she went to her family doctor yesterday for evaluation from the fall  Clinical Impression  Pt sitting balance is fair +; standing is fair.  Pt fatigued after ambulating for 16 ft.  Pt will benefit from SNF but she is unsure if she wants to go or not do to financial reasons.    PT Assessment  Patient needs continued PT services    Follow Up Recommendations  SNF;Home health PT (Pt will benefit from SNF but is not sure she wants to go.)    Does the patient have the potential to tolerate intense rehabilitation    no  Barriers to Discharge  none;  [place] or SNF      Equipment Recommendations  None recommended by PT    Recommendations for Other Services   none  Frequency Min 3X/week    Precautions / Restrictions Precautions Precautions: Fall Restrictions Weight Bearing Restrictions: No   Pertinent Vitals/Pain denies      Mobility  Bed Mobility Overal bed mobility: Needs Assistance Bed Mobility: Supine to Sit Supine to sit: Mod assist Transfers Overall transfer level: Modified independent Equipment used: Rolling walker (2 wheeled) Ambulation/Gait Ambulation/Gait assistance: Supervision Ambulation Distance (Feet): 16 Feet Assistive device: Rolling walker (2 wheeled) Gait Pattern/deviations: Decreased step length - right;Decreased step length - left Gait velocity interpretation: <1.8 ft/sec, indicative of risk for recurrent falls         PT Diagnosis: Difficulty  walking;Generalized weakness  PT Problem List: Decreased strength;Decreased activity tolerance;Decreased balance PT Treatment Interventions: Gait training;Patient/family education;Therapeutic exercise     PT Goals(Current goals can be found in the care plan section) Acute Rehab PT Goals Patient Stated Goal: Pt would like to go home but will consider SNF PT Goal Formulation: With patient Time For Goal Achievement: 07/04/13 Potential to Achieve Goals: Good  Visit Information  Last PT Received On: 07/02/13 History of Present Illness: [name] is a 73 y.o. female with PMH of HTN, HPL, CAD, DM, h/o CVA presented with slurred speech, confusion, gait ataxia since yesterday; per family she had several days of gradual decline including the inability to ambulate safely, of note she had a fall a couple of weeks ago, she went to her family doctor yesterday for evaluation from the fall       Prior [place] expects to be discharged to:: Private residence Living Arrangements: Children Available Help at Discharge: Family;Available PRN/intermittently Type of Home: House Home Access: Ramped entrance Home Layout: One level Home Equipment: Walker - 2 wheels;Wheelchair - [employer];Shower seat Additional Comments: patient has shower chair, RW, BSC at home  Prior Function Level of Independence: Independent with assistive device(s) Comments: Pt states she will go out to [employer] and will [place] a cart as long as possible and then she will go to a motorized chair. Communication Communication: No difficulties    Cognition  Cognition Arousal/Alertness: Awake/alert Overall Cognitive Status: Within Functional Limits for tasks assessed    Extremity/Trunk Assessment Lower Extremity  Assessment Lower Extremity Assessment: Generalized weakness   Balance Balance Overall balance assessment: History of Falls  End of Session PT - End of Session Equipment Utilized  During Treatment: Gait belt Activity Tolerance: Patient limited by fatigue Patient left: in chair;with call bell/phone within reach;with chair alarm set  GP     Rakim Moone,CINDY 07/02/2013, 9:47 AM

## 2013-07-02 NOTE — Clinical Social Work Psychosocial (Signed)
Clinical Social Work Department BRIEF PSYCHOSOCIAL ASSESSMENT 07/02/2013  Patient:  [name], [name]     Account Number:  [id number]     Admit date:  07/01/2013  Clinical Social Worker:  Edwyna Shell, CLINICAL SOCIAL WORKER  Date/Time:  07/02/2013 12:00 N  Referred by:  CSW  Date Referred:  07/02/2013 Referred for  SNF Placement   Other Referral:   Interview type:  Patient Other interview type:   Also spoke w son, [name], by telephone.    PSYCHOSOCIAL DATA Living Status:  FAMILY Admitted from facility:   Level of care:   Primary support name:  [name] Primary support relationship to patient:  CHILD, ADULT Degree of support available:   Per [facility], family works during the day and patient is often alone at home.  Has had [facility] RN; however, [facility] feels patient is not able to care for self at home and family is not able to assist at level patient may need    CURRENT CONCERNS Current Concerns  Post-Acute Placement   Other Concerns:    SOCIAL WORK ASSESSMENT / PLAN CSW met w patient at bedside, discussed PT recommendation for SNF rehab due to weakness and deconditioning due to illness.  Patient lives at home w 3 adult sons.  Sons work during day, per [facility], patient is often alone during day and sits in wheelchair.  Has had difficulty comprehending diabetes care, says she does not have funds to purchase food.  Appears that family in home does not have ability to provide additional support for patient as she needs at this time.    Patient became tearful when discussing possibility of SNF placement - "I dont know how I got this way", "I tried to take care of myself", verbalizes that she does not think that she will get better physically.  Agreeable to SNF placement, wanted to know "how long I would have to stay." CSW reinforced that patient could leave SNF when she wanted, unclear how long it would take for her to rehab sufficiently to return home.  Patient has been at  [facility] before - requested CSW pursue that placement if possible.    CSW discussed SNF benefits, eligibility, copays possibly required w patient's insurance (Medicare and Medicaid). Patient agreeable to bed search.   Assessment/plan status:  Psychosocial Support/Ongoing Assessment of Needs Other assessment/ plan:   Information/referral to community resources:   SNF list    PATIENT'S/FAMILY'S RESPONSE TO PLAN OF CARE: Patient tearful at prospect of losing her physical health and prior independence.  Mentioned that her arm hurt because she does not have forearm brace she needs and that she cannot see because she does not have her glasses.  CSW mentioned these needs to son, son said someone in the family would bring these items this evening as "we are all at work during the day.        Edwyna Shell, LCSW Clinical Social Worker [phone])

## 2013-07-02 NOTE — Progress Notes (Signed)
Subjective:  Breathing easier.  Objective:  Vital Signs in the last 24 hours: Temp:  [97.3 F (36.3 C)-98.1 F (36.7 C)] 97.4 F (36.3 C) (01/28 0328) Pulse Rate:  [78-133] 78 (01/28 0328) Resp:  [18-20] 20 (01/28 0328) BP: (91-133)/(62-99) 133/82 mmHg (01/28 0328) SpO2:  [94 %-99 %] 97 % (01/28 0328) Weight:  [248 lb 10.9 oz (112.8 kg)] 248 lb 10.9 oz (112.8 kg) (01/27 1259)  Intake/Output from previous day: 01/27 0701 - 01/28 0700 In: 1546.7 [P.O.:510; I.V.:1036.7] Out: 1975 [Urine:1975] Intake/Output from this shift: Total I/O In: 240 [P.O.:240] Out: 600 [Urine:600]  Physical Exam: NECK: Without JVD, HJR, or bruit LUNGS:Decreased breath sounds but Clear anterior, posterior, lateral HEART:Iregular rate and rhythm, no murmur, gallop, rub, bruit, thrill, or heave EXTREMITIES: Without cyanosis, clubbing, or edema   Lab Results:  Recent Labs  07/01/13 0532 07/01/13 0554 07/02/13 0501  WBC 6.8  --  6.8  HGB 10.1* 10.9* 9.5*  PLT 154  --  142*    Recent Labs  07/01/13 0532 07/01/13 0554 07/02/13 0501  NA 141 142 141  K 4.7 4.6 4.2  CL 106 111 107  CO2 20  --  19  GLUCOSE 261* 249* 163*  BUN 88* 95* 79*  CREATININE 2.68* 2.60* 2.66*    Recent Labs  07/01/13 0532  TROPONINI <0.30   Hepatic Function Panel  Recent Labs  07/01/13 0532  PROT 7.1  ALBUMIN 2.9*  AST 29  ALT 45*  ALKPHOS 83  BILITOT 0.2*    Recent Labs  07/02/13 0501  CHOL 96   No results found for this basename: PROTIME,  in the last 72 hours   Prior Cardiac Testing/Procedures 1. Echo 2012 Left ventricle: The cavity size was normal. Wall thickness was increased in a pattern of mild LVH. Systolic function was normal. The estimated ejection fraction was in the range of 60% to 65%. Wall motion was normal; there were no regional wall motion abnormalities. Features are consistent with a pseudonormal left ventricular filling pattern, with concomitant abnormal relaxation  and increased filling pressure (grade 2 diastolic dysfunction). E/medial e' > 15 suggests LV end diastolic pressure at least 20 mmHg. - Aortic valve: There was no stenosis. - Mitral valve: Moderately calcified annulus. - Left atrium: The atrium was mildly dilated. - Right ventricle: The cavity size was normal. Systolic function was normal. - Tricuspid valve: Peak RV-RA gradient: 40mm Hg (S). - Pulmonary arteries: PA systoilc pressure 34-38 mmHg. - Impressions: IVC measured 2.4 cm with < 50% respirophasic variation, suggesting RA pressure 11-15 mmHg.  2. Cardiac Cath 2012 Angiographic Findings:   Left main: Short segment, no disease.   Left Anterior Descending Artery: Diffuse calcification proximally with mild obstructive disease. The vessel quickly tapers to a small caliber vessel in the mid segment beyond the small diagonal Mieczyslaw Stamas. There is a 70% stenosis in the mid vessel just beyond the diagonal Betsey Sossamon. The vessel is approximately 1.75 mm in the mid segment and tapers even further in the distal vessel.   Circumflex Artery: Large dominant vessel with 30% proximal stenosis. Large trifurcating marginal with mild plaque disease.   Right Coronary Artery: Small, non-dominant vessel with diffuse 95% stenosis in the mid segment. (1.25 mm vessel).   Left Ventricular Angiogram: Not performed.   Impression:  1. Moderately severe stenosis in the small caliber mid LAD, too small for PCI.   2. Severe stenosis in small caliber non-dominant RCA, likely culprit. Too small for PCI.   3. Mild plaque  Circumflex.   Recommendations:   Medical management at this time. Any intervention on the LAD would require a very small stent and possibly a rotablator for atherectomy. A drug eluting stent would be preferable but in this patient with anemia, a drug eluting stent would not be the best option. I am hopeful that we can control her symptoms with medications alone. Her RCA is too small for PCI.        Assessment/Plan:  Subacute left occipital lobe infarct  Atrial fibrillation with rapid ventricular response on carvedilol at home,Cardizem 30mg  q8 added yesterday. Heart rate now better controlled. Chads score of 8. Has not been anticoagulated in the past because of frequent falls. To be readdressed. 2Decho pending.  CHF likely due to diastolic decompensation from rapid atrial fibrillation. Lasix 40 mg IV given yesterday. I/O;s negative 428  CKD stage IV crt 2.66 today  Coronary artery disease with significant stenosis of the RCA and cath in 2012. Not amenable to PCI. Medical therapy recommended  Hypertension: stable  Diabetes mellitus   LOS: 1 day    Ermalinda Barrios 07/02/2013, 11:58 AM  Attending Note Patient seen and discussed with PA Lenze. Rate controlled with diltiazem and coreg, stable blood pressures. In setting of afib high risk for CVA, with evidence on imaging of prior CVA from neuro thoughts likely cardioembolic. There are significant risks with and without anticoagulation.Reviewed Dr Cherlyn Cushing note, part of reason not on anticoagulation was the frequent falls but also there had not been evidence of recurrent afib by symptoms or EKG.  I would recommend eliquis 5mg  bid, despite her renal dysfunction she still qualifies for 5mg  bid dosing.Change to long acting cardizem tomorrow morning. Will try to contact family prior to starting anticoagulation for there thoughts on the risks and benefits, patient in her current state is not able to understand.  Carlyle Dolly MD

## 2013-07-02 NOTE — Clinical Social Work Placement (Signed)
    Clinical Social Work Department CLINICAL SOCIAL WORK PLACEMENT NOTE 07/03/2013  Patient:  [name], [name]  Account Number:  [id number] Admit date:  07/01/2013  Clinical Social Worker:  Edwyna Shell, CLINICAL SOCIAL WORKER  Date/time:  07/02/2013 12:00 N  Clinical Social Work is seeking post-discharge placement for this patient at the following level of care:   [place]   (*CSW will update this form in Epic as items are completed)   07/02/2013  Patient/family provided with [place] Department of Clinical Social Work's list of facilities offering this level of care within the geographic area requested by the patient (or if unable, by the patient's family).  07/02/2013  Patient/family informed of their freedom to choose among providers that offer the needed level of care, that participate in Medicare, Medicaid or managed care program needed by the patient, have an available bed and are willing to accept the patient.  07/02/2013  Patient/family informed of [facility]' ownership interest in [facility], as well as of the fact that they are under no obligation to receive care at this facility.  PASARR submitted to EDS on 07/02/2013 PASARR number received from EDS on 07/02/2013  FL2 transmitted to all facilities in geographic area requested by pt/family on  07/02/2013 FL2 transmitted to all facilities within larger geographic area on   Patient informed that his/her managed care company has contracts with or will negotiate with  certain facilities, including the following:     Patient/family informed of bed offers received:  07/03/2013 Patient chooses bed at [facility] Physician recommends and patient chooses bed at    Patient to be transferred to [facility] on  07/03/2013 Patient to be transferred to facility by Son's car  The following physician request were entered in Epic:   Additional Comments: Patient had preexisting  [place], [place] Worker [phone])

## 2013-07-02 NOTE — Progress Notes (Signed)
[facility] PROGRESS NOTE  [name] ZOX:[id number] DOB: December 27, 1940 DOA: 07/01/2013 PCP: Sherrie Mustache, MD  Brief narrative: 73 y.o. female with PMH of HTN, HPL, CAD, DM, h/o CVA presented with slurred speech, confusion, gait ataxia that started one day prior to this admission and per family she had several days of gradual decline including the inability to ambulate safely, of note she had a fall a couple of weeks ago. In ED, pt found to be confused, no chest pain, no SOB, no fever, chills, no n/v/d reported per family members. ED work up showed subacute CVA and [facility] asked to admit for further evaluation.   Assessment/Plan:  Principal Problem:   CVA (cerebral infarction) - appreciate neurology and cardiology input - given new stroke, anticoagulation is recommended but per cardio rec's --> high risk of fall so no AC recommended per cardiology team  - PT evaluation done, SNF recommended - risk factor control as possible  - per neurology., plan on obtaining EEG to rule out possibility of seizures  Active Problems:   CAD (coronary artery disease) - Significant stenosis of RCA per cardiac cath in 2012 - this is too small for intervention so medical management recommended at that time   DM type 2, uncontrolled, with neuropathy - reasonable inpatient control - pt is on Lantus at home 40 U QHS, will start here half home dose 20 U QHS and will monitor CBG closely - diabetic educator consultation ordered and will follow up on recommendations  - continue SSI  - CBG's in past 24 hours: 203, 147, 276   Chronic diastolic CHF - pulmonary edema - clinically stable, weight 248 lbs this AM - continue to monitor daily weights, I's and O's - continue torsemide    Anemia of chronic disease - likely from diabetes, drop in Hg over 24 hours  - no signs of active bleeding, CBC in AM   Acute on chronic renal failure  - Cr slightly above her last baseline November 2014 (Cr = 2.56)   HTN -  continue Coreg, Imdur, Cardizem, Torsemide    HLD - continue statin   Atrial fibrillation with rapid ventricular response - rate controlled this AM - there is questionable medical non compliance - restarted on carvedilol BID - per ECHO in 2012, normal EF with grade II diastolic dysfunction, 2 D ECHO re ordered this admission, will need to follow up on results  - CHADs-Vasc score- 6. Per Card's --> would not start anticoagulation at this time due to frequent falls.  Studies: Ct Head Wo Contrast   07/01/2013   New left inferomedial occipital patchy hypodensity suggesting subacute infarct in left posterior cerebral artery territory.  Involutional changes. Moderate white matter changes, chronic small vessel ischemic disease.  Mr [name] Head Wo Contrast   07/01/2013   Degraded by motion. Left PCA remains patent with moderate irregularity and P2 segment stenosis. Advanced ICA siphon atherosclerosis. Cannot exclude moderate to severe bilateral supra clinoid ICA stenosis. Carotid termini remain patent.   Mr Brain Wo Contrast   07/01/2013  Subacute appearing left occipital lobe infarct with petechial hemorrhage, corresponding to the earlier CT finding. No associated mass effect. No other acute intracranial abnormality. Stable underlying chronic small vessel disease.  Dg Chest Port 1 View   07/01/2013  CHF, unchanged.  Dg Chest Port 1 View  07/01/2013  Stable cardiomegaly, increasing interstitial pulmonary edema.     Code Status: Full Family Communication: pt at bedside  Disposition Plan: Remains inpatient but when medically  ready, likely d/c SNF  Leisa Lenz, MD  [facility] Pager [phone]  If 7PM-7AM, please contact night-coverage [email or web address] Password [email or web address] 07/02/2013, 10:04 AM   LOS: 1 day   Consultants:  Neurology  Cardiology  Antibiotics:  None   HPI/Subjective: No events overnight.   Objective: Filed Vitals:   07/01/13 2213 07/02/13 0016 07/02/13 0126 07/02/13 0328  BP:  119/72 117/71 105/67 133/82  Pulse:  92 84 78  Temp: 97.5 F (36.4 C) 97.7 F (36.5 C) 97.3 F (36.3 C) 97.4 F (36.3 C)  TempSrc: Oral Oral Oral Oral  Resp: 20 20 20 20   Height:      Weight:      SpO2: 98% 98% 98% 97%    Intake/Output Summary (Last 24 hours) at 07/02/13 1004 Last data filed at 07/02/13 0936  Gross per 24 hour  Intake 1786.67 ml  Output   2575 ml  Net -788.33 ml    Exam:   General:  Pt is alert, in mild distress from discomfort, obese  Cardiovascular: Irregular rate and rhythm, appreciate S1 and S2  Respiratory: No wheezing, diminished breath sounds at bases, poor inspiratory effort   Abdomen: Soft, non tender, non distended, bowel sounds present, no guarding  Extremities: No edema, pulses DP and PT palpable bilaterally  Neuro: Grossly nonfocal  Data Reviewed: Basic Metabolic Panel:  Recent Labs Lab 07/01/13 0532 07/01/13 0554 07/02/13 0501  NA 141 142 141  K 4.7 4.6 4.2  CL 106 111 107  CO2 20  --  19  GLUCOSE 261* 249* 163*  BUN 88* 95* 79*  CREATININE 2.68* 2.60* 2.66*  CALCIUM 8.8  --  8.5  MG 2.3  --   --    Liver Function Tests:  Recent Labs Lab 07/01/13 0532  AST 29  ALT 45*  ALKPHOS 83  BILITOT 0.2*  PROT 7.1  ALBUMIN 2.9*    Recent Labs Lab 07/01/13 0532  AMMONIA 54   CBC:  Recent Labs Lab 07/01/13 0532 07/01/13 0554 07/02/13 0501  WBC 6.8  --  6.8  NEUTROABS 3.9  --   --   HGB 10.1* 10.9* 9.5*  HCT 30.8* 32.0* 28.5*  MCV 95.1  --  95.6  PLT 154  --  142*   Cardiac Enzymes:  Recent Labs Lab 07/01/13 0532  TROPONINI <0.30   CBG:  Recent Labs Lab 07/01/13 1025 07/01/13 1656 07/01/13 2101 07/02/13 0753  GLUCAP 164* 204* 203* 147*   Scheduled Meds: . aspirin  325 mg Oral Daily  . atorvastatin  40 mg Oral q1800  . carvedilol  12.5 mg Oral BID WC  . diltiazem  30 mg Oral Q8H  . ferrous sulfate  325 mg Oral Q breakfast  . heparin  5,000 Units Subcutaneous Q8H  . insulin aspart  0-9 Units  Subcutaneous TID WC  . isosorbide mononitrate  30 mg Oral Daily  . nystatin   Topical BID  . torsemide  20 mg Oral Daily   Continuous Infusions:

## 2013-07-02 NOTE — Progress Notes (Signed)
SLP Cancellation Note  Patient Details Name: [name] MRN: [id number] DOB: Oct 13, 1940   Cancelled treatment:       Reason Eval/Treat Not Completed: Patient at procedure or test/unavailable   Kaige Whistler S 07/02/2013, 10:26 AM

## 2013-07-02 NOTE — Clinical Social Work Note (Signed)
CSW is seeking SNF bed offers for patient, contacted [facility] at patient request as this is patient's first choice.  Edwyna Shell, LCSW Clinical Social Worker [phone])

## 2013-07-03 ENCOUNTER — Other Ambulatory Visit (HOSPITAL_COMMUNITY): Payer: Medicare Other

## 2013-07-03 DIAGNOSIS — E860 Dehydration: Secondary | ICD-10-CM

## 2013-07-03 LAB — GLUCOSE, CAPILLARY
GLUCOSE-CAPILLARY: 274 mg/dL — AB (ref 70–99)
Glucose-Capillary: 159 mg/dL — ABNORMAL HIGH (ref 70–99)

## 2013-07-03 LAB — BASIC METABOLIC PANEL
BUN: 86 mg/dL — AB (ref 6–23)
CO2: 20 mEq/L (ref 19–32)
Calcium: 8.6 mg/dL (ref 8.4–10.5)
Chloride: 105 mEq/L (ref 96–112)
Creatinine, Ser: 2.93 mg/dL — ABNORMAL HIGH (ref 0.50–1.10)
GFR calc non Af Amer: 15 mL/min — ABNORMAL LOW (ref >90)
GFR, EST AFRICAN AMERICAN: 17 mL/min — AB (ref >90)
Glucose, Bld: 162 mg/dL — ABNORMAL HIGH (ref 70–99)
POTASSIUM: 4.8 meq/L (ref 3.7–5.3)
SODIUM: 139 meq/L (ref 137–147)

## 2013-07-03 LAB — CBC
HCT: 31.4 % — ABNORMAL LOW (ref 36.0–46.0)
Hemoglobin: 10 g/dL — ABNORMAL LOW (ref 12.0–15.0)
MCH: 30.6 pg (ref 26.0–34.0)
MCHC: 31.8 g/dL (ref 30.0–36.0)
MCV: 96 fL (ref 78.0–100.0)
Platelets: 158 10*3/uL (ref 150–400)
RBC: 3.27 MIL/uL — ABNORMAL LOW (ref 3.87–5.11)
RDW: 18.4 % — AB (ref 11.5–15.5)
WBC: 6.7 10*3/uL (ref 4.0–10.5)

## 2013-07-03 LAB — PRO B NATRIURETIC PEPTIDE: Pro B Natriuretic peptide (BNP): 3715 pg/mL — ABNORMAL HIGH (ref 0–125)

## 2013-07-03 MED ORDER — APIXABAN 5 MG PO TABS: 5.0000 mg | ORAL_TABLET | Freq: Two times a day (BID) | ORAL | Status: DC

## 2013-07-03 MED ORDER — DILTIAZEM HCL ER COATED BEADS 120 MG PO CP24: 120.0000 mg | ORAL_CAPSULE | Freq: Every day | ORAL | Status: DC

## 2013-07-03 MED ORDER — CARVEDILOL 12.5 MG PO TABS: 12.5000 mg | ORAL_TABLET | Freq: Two times a day (BID) | ORAL | Status: DC

## 2013-07-03 MED ORDER — VALACYCLOVIR HCL 500 MG PO TABS
1000.0000 mg | ORAL_TABLET | Freq: Three times a day (TID) | ORAL | Status: DC
  Filled 2013-07-03 (×4): qty 2

## 2013-07-03 MED ORDER — NYSTATIN 100000 UNIT/GM EX POWD: 1.0000 | Freq: Two times a day (BID) | CUTANEOUS | Status: DC

## 2013-07-03 MED ORDER — SENNOSIDES-DOCUSATE SODIUM 8.6-50 MG PO TABS: 1.0000 | ORAL_TABLET | Freq: Every evening | ORAL | Status: DC | PRN

## 2013-07-03 MED ORDER — ACYCLOVIR SODIUM 50 MG/ML IV SOLN
10.0000 mg/kg | Freq: Three times a day (TID) | INTRAVENOUS | Status: DC
  Filled 2013-07-03 (×4): qty 10.5

## 2013-07-03 MED ORDER — INSULIN ASPART 100 UNIT/ML ~~LOC~~ SOLN: 0.0000 [IU] | Freq: Three times a day (TID) | SUBCUTANEOUS | Status: DC

## 2013-07-03 MED ORDER — VALACYCLOVIR HCL 1 G PO TABS: 1000.0000 mg | ORAL_TABLET | Freq: Every day | ORAL | Status: AC

## 2013-07-03 MED ORDER — APIXABAN 5 MG PO TABS
5.0000 mg | ORAL_TABLET | Freq: Two times a day (BID) | ORAL | Status: DC
  Administered 2013-07-03: 5 mg via ORAL
  Filled 2013-07-03: qty 1

## 2013-07-03 MED ORDER — VALACYCLOVIR HCL 500 MG PO TABS
1000.0000 mg | ORAL_TABLET | Freq: Every day | ORAL | Status: DC
  Administered 2013-07-03: 1000 mg via ORAL
  Filled 2013-07-03 (×2): qty 2

## 2013-07-03 MED ORDER — ACETAMINOPHEN 325 MG PO TABS: 650.0000 mg | ORAL_TABLET | Freq: Four times a day (QID) | ORAL | Status: DC | PRN

## 2013-07-03 NOTE — Progress Notes (Addendum)
Pt is to be discharged to [facility] today. Pt is in NAD, IV is out, all paperwork has been reviewed/discussed with patient, and there are no questions/concerns at this time. Report has been called. Assessment is unchanged from this morning. Pt is to be accompanied downstairs by staff and family via wheelchair.

## 2013-07-03 NOTE — Progress Notes (Signed)
Patient ID: [name], female   DOB: 1941-05-31, 73 y.o.   MRN: [id number]     Subjective:   No events overnight.    Objective:   Temp:  [97.3 F (36.3 C)-97.7 F (36.5 C)] 97.6 F (36.4 C) (01/29 0856) Pulse Rate:  [64-88] 86 (01/29 0856) Resp:  [20-24] 20 (01/29 0856) BP: (93-134)/(59-76) 111/76 mmHg (01/29 0856) SpO2:  [98 %-100 %] 98 % (01/29 0856) Last BM Date: 07/01/13  Filed Weights   07/01/13 0431 07/01/13 1259  Weight: 240 lb (108.863 kg) 248 lb 10.9 oz (112.8 kg)    Intake/Output Summary (Last 24 hours) at 07/03/13 0908 Last data filed at 07/03/13 0850  Gross per 24 hour  Intake    960 ml  Output   2100 ml  Net  -1140 ml    Telemetry: afib normal rates  Exam:  General: NAD  Resp: CTAB  Cardiac: irreg, rate 70, no m/r/g  ZJ:IRCVELF soft, NT, ND  MSK: no LE edema  Neuro: no focal deficits   Lab Results:  Basic Metabolic Panel:  Recent Labs Lab 07/01/13 0532 07/01/13 0554 07/02/13 0501 07/03/13 0457  NA 141 142 141 139  K 4.7 4.6 4.2 4.8  CL 106 111 107 105  CO2 20  --  19 20  GLUCOSE 261* 249* 163* 162*  BUN 88* 95* 79* 86*  CREATININE 2.68* 2.60* 2.66* 2.93*  CALCIUM 8.8  --  8.5 8.6  MG 2.3  --   --   --     Liver Function Tests:  Recent Labs Lab 07/01/13 0532  AST 29  ALT 45*  ALKPHOS 83  BILITOT 0.2*  PROT 7.1  ALBUMIN 2.9*    CBC:  Recent Labs Lab 07/01/13 0532 07/01/13 0554 07/02/13 0501 07/03/13 0457  WBC 6.8  --  6.8 6.7  HGB 10.1* 10.9* 9.5* 10.0*  HCT 30.8* 32.0* 28.5* 31.4*  MCV 95.1  --  95.6 96.0  PLT 154  --  142* 158    Cardiac Enzymes:  Recent Labs Lab 07/01/13 0532  TROPONINI <0.30    BNP:  Recent Labs  04/13/13 1325 07/01/13 0552 07/03/13 0457  PROBNP 4581.0* 4028.0* 3715.0*    Coagulation: No results found for this basename: INR,  in the last 168 hours  ECG:   Medications:   Scheduled Medications: . aspirin  325 mg Oral Daily  . atorvastatin  40 mg Oral q1800  .  carvedilol  12.5 mg Oral BID WC  . diltiazem  120 mg Oral Daily  . ferrous sulfate  325 mg Oral Q breakfast  . heparin  5,000 Units Subcutaneous Q8H  . influenza vac split quadrivalent PF  0.5 mL Intramuscular Tomorrow-1000  . insulin aspart  0-9 Units Subcutaneous TID WC  . insulin glargine  20 Units Subcutaneous QHS  . isosorbide mononitrate  30 mg Oral Daily  . nystatin   Topical BID  . pneumococcal 23 valent vaccine  0.5 mL Intramuscular Tomorrow-1000  . torsemide  20 mg Oral Daily  . Vitamin D (Ergocalciferol)  50,000 Units Oral Q Thu     Infusions:     PRN Medications:  acetaminophen, senna-docusate     Assessment/Plan    1. Afib - rate controlled with diltiazem and coreg - discussed in detail with patient's son [name] and daughter [name] this morning the risk and benefits of anticoagulation. Patient with evidence of prior cardioemolic stroke with high risk for recurrence, but also increased risk of bleeding related to  falls. Given risk benefit, my recommendation is to start eliquis. Family understands risk and benefits and agrees.  - will start eliquis 5mg  bid, stop her aspirin - will sign off from cardiac standpoint, she may follow up with NP Lawrence in 2 weeks, and then be set back up with Dr Percival Spanish in [place].      Carlyle Dolly, M.D., F.A.C.C.

## 2013-07-03 NOTE — Discharge Instructions (Signed)
Anticoagulation, Generic Anticoagulants are medications used to prevent clots from developing in your veins. These medications are also known as blood thinners. If blood clots are untreated, they could travel to your lungs. This is called a pulmonary embolus. A blood clot in your lungs can be fatal.  Caregivers often use anticoagulants to prevent clots following surgery. Anticoagulants are also used along with aspirin when the heart is not getting enough blood. Another anticoagulant called warfarin is started 2 to 3 days after a rapid-acting injectable anticoagulant is started. The rapid-acting anticoagulants are usually continued until warfarin has begun to work. Your caregiver will judge this length of time by blood tests known as the prothrombin time (PT) and International Normalization Ratio (INR). This means that your blood is at the necessary and best level to prevent clots. RISKS AND COMPLICATIONS  If you have received recent epidural anesthesia, spinal anesthesia, or a spinal tap while receiving anticoagulants, you are at risk for developing a blood clot in or around the spine. This condition could result in long-term or permanent paralysis.  Because anticoagulants thin your blood, severe bleeding may occur from any tissue or organ. Symptoms of the blood being too thin may include:  Bleeding from the nose or gums that does not stop quickly.  Unusual bruising or bruising easily.  Swelling or pain at an injection site.  A cut that does not stop bleeding within 10 minutes.  Continual nausea for more than 1 day or vomiting blood.  Coughing up blood.  Blood in the urine which may appear as pink, red, or brown urine.  Blood in bowel movements which may appear as red, dark or black stools.  Sudden weakness or numbness of the face, arm, or leg, especially on one side of the body.  Sudden confusion.  Trouble speaking (aphasia) or understanding.  Sudden trouble seeing in one or both  eyes.  Sudden trouble walking.  Dizziness.  Loss of balance or coordination.  Severe pain, such as a headache, joint pain, or back pain.  Fever.  Too little anticoagulation continues to allow the risk for blood clots. HOME CARE INSTRUCTIONS   Due to the complications of anticoagulants, it is very important that you take your anticoagulant as directed by your caregiver. Anticoagulants need to be taken exactly as instructed. Be sure you understand all your anticoagulant instructions.  Warfarin. Your caregiver will advise you on the length of treatment (usually 3 6 months, sometimes lifelong).  Take warfarin exactly as directed by your caregiver. It is recommended that you take your warfarin dose at the same time of the day. It is preferred that you take warfarin in the late afternoon. If you have been told to stop taking warfarin, do not resume taking warfarin until directed to do so by your caregiver. Follow your caregiver's instructions if you accidentally take an extra dose or miss a dose of warfarin. It is very important to take warfarin as directed since bleeding or blood clots could result in chronic or permanent injury, pain, or disability.  Too much and too little warfarin are both dangerous. Too much warfarin increases the risk of bleeding. Too little warfarin continues to allow the risk for blood clots. While taking warfarin, you will need to have regular blood tests to measure your blood clotting time. These blood tests usually include both the PT and INR tests. The PT and INR results allow your caregiver to adjust your dose of warfarin. The dose can change for many reasons. It is critically  important that you take warfarin exactly as prescribed, and that you have your PT and INR levels drawn exactly as directed. Follow up with your laboratory test appointments as directed. It is very important to keep your lab appointments. Not keeping lab appointments could result in a chronic or  permanent injury, pain, or disability.  Many foods, especially foods high in vitamin K can interfere with warfarin and affect the PT and INR results. Foods high in vitamin K include spinach, kale, broccoli, cabbage, collard and turnip greens, brussels sprouts, peas, cauliflower, seaweed, and parsley as well as beef and pork liver, green tea, and soybean oil. You should eat a consistent amount of foods high in vitamin K. Avoid major changes in your diet, or notify your caregiver before changing your diet. Arrange a visit with a dietitian to answer your questions.  Many medicines can interfere with warfarin and affect the PT and INR results. You must tell your caregiver about any and all medicines you take, this includes all vitamins and supplements. Ask your caregiver before taking these. Prescription and over-the-counter medicine consistency is critical to warfarin management. It is important that potential interactions are checked before you start a new medicine. Be especially cautious with aspirin and anti-inflammatory medicines. Ask your caregiver before taking these. Medicines such as antibiotics and acid-reducing medicine can interact with warfarin and can cause an increased warfarin effect. Warfarin can also interfere with the effectiveness of medicines you are taking. Do not take or discontinue any prescribed or over-the-counter medicine except on the advice of your caregiver or pharmacist.  Some vitamins, supplements, and herbal products interfere with the effectiveness of warfarin. Vitamin E may increase the anticoagulant effects of warfarin. Vitamin K may can cause warfarin to be less effective. Do not take or discontinue any vitamin, supplement, or herbal product except on the advice of your caregiver or pharmacist.  Alcohol can change the body's ability to handle warfarin. It is best to avoid alcoholic drinks or consume only very small amounts while taking warfarin. Notify your caregiver if you  change your alcohol intake. A sudden increase in alcohol use can increase your risk of bleeding. Chronic alcohol use can cause warfarin to be less effective.  If you have a loss of appetite or get the stomach flu (viral gastroenteritis), talk to your caregiver as soon as possible. A decrease in your normal vitamin K intake can make you more sensitive to your usual dose of warfarin.  Some medical conditions may increase your risk for bleeding while you are taking warfarin. A fever, diarrhea lasting more than a day, worsening heart failure, or worsening liver function are some medical conditions that could affect warfarin. Contact your caregiver if you have any of these medical conditions.  Warfarin can have side effects, such as excessive bruising or bleeding. You will need to hold pressure over cuts for longer than usual.  Be careful not to cut yourself when using sharp objects.  Notify your dentist or other caregivers before procedures.  Limit physical activities or sports that could result in a fall or cause injury. Avoid contact sports.  Wear a medical alert bracelet or carry a medical alert card. SEEK MEDICAL CARE IF:   You develop any rashes.  You have any worsening of the condition for which you are receiving anticoagulation therapy. SEEK IMMEDIATE MEDICAL CARE IF:   Bleeding from the nose or gums does not stop quickly.  You have unusual bruising or are bruising easily.  Swelling or pain occurs  at an injection site.  A cut does not stop bleeding within 10 minutes.  You have continual nausea for more than 1 day or are vomiting blood.  You are coughing up blood.  You have blood in the urine.  You have dark or black stools.  You have sudden weakness or numbness of the face, arm, or leg, especially on one side of the body.  You have sudden confusion.  You have trouble speaking (aphasia) or understanding.  You have sudden trouble seeing in one or both eyes.  You have  sudden trouble walking.  You have dizziness.  You have a loss of balance or coordination.  You have severe pain, such as a headache, joint pain, or back pain.  You have a serious fall or head injury, even if you are not bleeding.  You have an oral temperature above 102 F (38.9 C), not controlled by medicine. ANY OF THESE SYMPTOMS MAY REPRESENT A SERIOUS PROBLEM THAT IS AN EMERGENCY. Do not wait to see if the symptoms will go away. Get medical help right away. Call your local emergency services (911 in [place] DO NOT drive yourself to the hospital. MAKE SURE YOU:   Understand these instructions.  Will watch your condition.  Will get help right away if you are not doing well or get worse. Document Released: 05/22/2005 Document Revised: 02/14/2012 Document Reviewed: 12/25/2007 [facility] Patient Information 2014 [place]. Apixaban oral tablets What is this medicine? APIXABAN (a PIX a ban) is an anticoagulant (blood thinner). It is used to lower the chance of stroke in people with a medical condition called atrial fibrillation. It is also used to treat or prevent blood clots in the lungs or in the veins. This medicine may be used for other purposes; ask your health care provider or pharmacist if you have questions. COMMON BRAND NAME(S): Eliquis What should I tell my health care provider before I take this medicine? They need to know if you have any of these conditions: -bleeding disorders -bleeding in the brain -blood in your stools (black or tarry stools) or if you have blood in your vomit -history of stomach bleeding -kidney disease -liver disease -mechanical heart valve -an unusual or allergic reaction to apixaban, other medicines, foods, dyes, or preservatives -pregnant or trying to get pregnant -breast-feeding How should I use this medicine? Take this medicine by mouth with a glass of water. Follow the directions on the prescription label. You can take it with or without  food. If it upsets your stomach, take it with food. Take your medicine at regular intervals. Do not take it more often than directed. Do not stop taking except on your doctor's advice. Stopping this medicine may increase your risk of a blot clot. Be sure to refill your prescription before you run out of medicine. Talk to your pediatrician regarding the use of this medicine in children. Special care may be needed. Overdosage: If you think you have taken too much of this medicine contact a poison control center or emergency room at once. NOTE: This medicine is only for you. Do not share this medicine with others. What if I miss a dose? If you miss a dose, take it as soon as you can. If it is almost time for your next dose, take only that dose. Do not take double or extra doses. What may interact with this medicine? This medicine may interact with the following: -aspirin and aspirin-like medicines -certain medicines for fungal infections like ketoconazole and itraconazole -certain  medicines for seizures like carbamazepine and phenytoin -certain medicines that treat or prevent blood clots like warfarin, enoxaparin, and dalteparin -clarithromycin -NSAIDs, medicines for pain and inflammation, like ibuprofen or naproxen -rifampin -ritonavir -St. John's wort This list may not describe all possible interactions. Give your health care provider a list of all the medicines, herbs, non-prescription drugs, or dietary supplements you use. Also tell them if you smoke, drink alcohol, or use illegal drugs. Some items may interact with your medicine. What should I watch for while using this medicine? Notify your doctor or health care professional and seek emergency treatment if you develop breathing problems; changes in vision; chest pain; severe, sudden headache; pain, swelling, warmth in the leg; trouble speaking; sudden numbness or weakness of the face, arm, or leg. These can be signs that your condition has  gotten worse. If you are going to have surgery, tell your doctor or health care professional that you are taking this medicine. Tell your health care professional that you use this medicine before you have a spinal or epidural procedure. Sometimes people who take this medicine have bleeding problems around the spine when they have a spinal or epidural procedure. This bleeding is very rare. If you have a spinal or epidural procedure while on this medicine, call your health care professional immediately if you have back pain, numbness or tingling (especially in your legs and feet), muscle weakness, paralysis, or loss of bladder or bowel control. Avoid sports and activities that might cause injury while you are using this medicine. Severe falls or injuries can cause unseen bleeding. Be careful when using sharp tools or knives. Consider using an Copy. Take special care brushing or flossing your teeth. Report any injuries, bruising, or red spots on the skin to your doctor or health care professional. What side effects may I notice from receiving this medicine? Side effects that you should report to your doctor or health care professional as soon as possible: -allergic reactions like skin rash, itching or hives, swelling of the face, lips, or tongue -signs and symptoms of bleeding such as bloody or black, tarry stools; red or dark-brown urine; spitting up blood or brown material that looks like coffee grounds; red spots on the skin; unusual bruising or bleeding from the eye, gums, or nose This list may not describe all possible side effects. Call your doctor for medical advice about side effects. You may report side effects to FDA at 1-800-FDA-1088. Where should I keep my medicine? Keep out of the reach of children. Store at room temperature between 20 and 25 degrees C (68 and 77 degrees F). Throw away any unused medicine after the expiration date. NOTE: This sheet is a summary. It may not cover all  possible information. If you have questions about this medicine, talk to your doctor, pharmacist, or health care provider.  2014, [employer] Standard. (2013-01-24 11:59:24)

## 2013-07-03 NOTE — Clinical Social Work Note (Signed)
Patients son, Richardson Landry, will arrive after 2 PM to transport patient to [facility] in private car.  SNF aware of plan, willing to have family sign paperwork upon arrival this afternoon.    Edwyna Shell, LCSW Clinical Social Worker [phone])

## 2013-07-03 NOTE — Progress Notes (Signed)
Patient ID: [name], female   DOB: 1940/09/24, 73 y.o.   MRN: [id number]  Leland A. Merlene Laughter, MD     [email or web address]          [name] is an 73 y.o. female.   Assessment/Plan: 1. This patient presents with multiple symptoms ( Altered mental status, weakness of the legs, gait ataxia, tremors, and confusion) with no clear single unifying diagnosis. In my view, the findings seen on MRI seems more subacute to chronic and by itself is unlikely explain the patient's symptoms. The patient is clearly amnestic to why she is in the hospital. She also seemed to have significant flapping/asterixis suggestive of metabolic disturbance although there is no clear underlying metabolic disturbance to explain the patient's symptoms. The MRI finding could be due to chronic infarct with acute infarct or acute seizures. She does have renal insufficiency but this appears to be chronic. Her creatinine is at baseline.  An EEG will be obtained-Pending. The patient's symptoms and examination has improved markedly with discontinuation of Lyrica. One wonders if this could be a culprit although she has been on this medication for a while the worsening renal failure may have limited the clearance of the medication.  2. 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 This is associated with chronic hemosiderin deposits again indicating its chronicity. In the setting of atrial fibrillation, this is undoubtedly cardioembolic. Anticoagulation long-term is recommended. She can be started once the gait issue has resolved.    The patient reports that she is feeling better today. She reports not been started on any new medications recently. She reports that she has been on Lyrica for quite a while for about a year. The dose has not been increased. She states that she was apparently having shingles on the right cranial area and was started on a medication for  this although she cannot tell me which medication this is.  GENERAL: This is a morbidly obese lady in no acute distress. The patient is sitting in the chair at the bedside. She catches sitting in a recliner and appears a lot more comfortable today. She does not have the continuous flapping/asterixis. HEENT: Supple. Atraumatic normocephalic.  ABDOMEN: soft  EXTREMITIES: No edema. There is marked arthritic changes of the knees bilaterally.  BACK: Normal.  SKIN: Normal by inspection.  MENTAL STATUS: Alert and oriented. Speech show some evidence of mild dysarthria. The language and cognition are generally Fine although insight is quite limited.  CRANIAL NERVES: Pupils are equal, round and reactive to light and accommodation; extra ocular movements are full, there is no significant nystagmus; visual fields are full; upper and lower facial muscles are normal in strength and symmetric, there is no flattening of the nasolabial folds; tongue is midline; uvula is midline; shoulder elevation is normal.  MOTOR: Normal tone, bulk and strength; no pronator drift.  COORDINATION: There is some occasional mild the flapping noted. There is no clear tremors with action or at rest. There is no akinesia or rigidity. No dysmetria.; No bradykinesia.       Objective: Vital signs in last 24 hours: Temp:  [97.3 F (36.3 C)-97.7 F (36.5 C)] 97.5 F (36.4 C) (01/29 0300) Pulse Rate:  [64-88] 76 (01/29 0300) Resp:  [20-24] 20 (01/29 0300) BP: (93-134)/(59-68) 93/59 mmHg (01/29 0300) SpO2:  [98 %-100 %] 100 % (01/29 0300)  Intake/Output from previous day: 01/28 0701 - 01/29 0700 In: 720 [P.O.:720] Out: 1550 [Urine:1550] Intake/Output  this shift: Total I/O In: 240 [P.O.:240] Out: 550 [Urine:550] Nutritional status: Cardiac   Lab Results: Results for orders placed during the hospital encounter of 07/01/13 (from the past 48 hour(s))  GLUCOSE, CAPILLARY     Status: Abnormal   Collection Time    07/01/13  10:25 AM      Result Value Range   Glucose-Capillary 164 (*) 70 - 99 mg/dL  GLUCOSE, CAPILLARY     Status: Abnormal   Collection Time    07/01/13  4:56 PM      Result Value Range   Glucose-Capillary 204 (*) 70 - 99 mg/dL   Comment 1 Notify RN     Comment 2 Documented in Chart    GLUCOSE, CAPILLARY     Status: Abnormal   Collection Time    07/01/13  9:01 PM      Result Value Range   Glucose-Capillary 203 (*) 70 - 99 mg/dL  HEMOGLOBIN A1C     Status: Abnormal   Collection Time    07/02/13  5:01 AM      Result Value Range   Hemoglobin A1C 8.5 (*) <5.7 %   Comment: (NOTE)                                                                               According to the ADA Clinical Practice Recommendations for 2011, when     HbA1c is used as a screening test:      >=6.5%   Diagnostic of Diabetes Mellitus               (if abnormal result is confirmed)     5.7-6.4%   Increased risk of developing Diabetes Mellitus     References:Diagnosis and Classification of Diabetes Mellitus,Diabetes     AVWU,9811,91(YNWGN 1):S62-S69 and Standards of Medical Care in             Diabetes - 2011,Diabetes Care,2011,34 (Suppl 1):S11-S61.   Mean Plasma Glucose 197 (*) <117 mg/dL   Comment: Performed at [place]: None   Collection Time    07/02/13  5:01 AM      Result Value Range   Cholesterol 96  0 - 200 mg/dL   Triglycerides 66  <150 mg/dL   HDL 49  >39 mg/dL   Total CHOL/HDL Ratio 2.0     VLDL 13  0 - 40 mg/dL   LDL Cholesterol 34  0 - 99 mg/dL   Comment:            Total Cholesterol/HDL:CHD Risk     Coronary Heart Disease Risk Table                         Men   Women      1/2 Average Risk   3.4   3.3      Average Risk       5.0   4.4      2 X Average Risk   9.6   7.1      3 X Average Risk  23.4   11.0  Use the calculated Patient Ratio     above and the CHD Risk Table     to determine the patient's CHD Risk.                ATP III  CLASSIFICATION (LDL):      <100     mg/dL   Optimal      100-129  mg/dL   Near or Above                        Optimal      130-159  mg/dL   Borderline      160-189  mg/dL   High      >190     mg/dL   Very High  CBC     Status: Abnormal   Collection Time    07/02/13  5:01 AM      Result Value Range   WBC 6.8  4.0 - 10.5 K/uL   RBC 2.98 (*) 3.87 - 5.11 MIL/uL   Hemoglobin 9.5 (*) 12.0 - 15.0 g/dL   HCT 28.5 (*) 36.0 - 46.0 %   MCV 95.6  78.0 - 100.0 fL   MCH 31.9  26.0 - 34.0 pg   MCHC 33.3  30.0 - 36.0 g/dL   RDW 18.7 (*) 11.5 - 15.5 %   Platelets 142 (*) 150 - 400 K/uL  BASIC METABOLIC PANEL     Status: Abnormal   Collection Time    07/02/13  5:01 AM      Result Value Range   Sodium 141  137 - 147 mEq/L   Potassium 4.2  3.7 - 5.3 mEq/L   Chloride 107  96 - 112 mEq/L   CO2 19  19 - 32 mEq/L   Glucose, Bld 163 (*) 70 - 99 mg/dL   BUN 79 (*) 6 - 23 mg/dL   Creatinine, Ser 2.66 (*) 0.50 - 1.10 mg/dL   Calcium 8.5  8.4 - 10.5 mg/dL   GFR calc non Af Amer 17 (*) >90 mL/min   GFR calc Af Amer 20 (*) >90 mL/min   Comment: (NOTE)     The eGFR has been calculated using the CKD EPI equation.     This calculation has not been validated in all clinical situations.     eGFR's persistently <90 mL/min signify possible Chronic Kidney     Disease.  GLUCOSE, CAPILLARY     Status: Abnormal   Collection Time    07/02/13  7:53 AM      Result Value Range   Glucose-Capillary 147 (*) 70 - 99 mg/dL  AMMONIA     Status: None   Collection Time    07/02/13 10:05 AM      Result Value Range   Ammonia 38  11 - 60 umol/L  GLUCOSE, CAPILLARY     Status: Abnormal   Collection Time    07/02/13 11:38 AM      Result Value Range   Glucose-Capillary 276 (*) 70 - 99 mg/dL   Comment 1 Notify RN    GLUCOSE, CAPILLARY     Status: Abnormal   Collection Time    07/02/13  5:17 PM      Result Value Range   Glucose-Capillary 174 (*) 70 - 99 mg/dL   Comment 1 Notify RN     Comment 2 Documented in Chart     GLUCOSE, CAPILLARY     Status: Abnormal   Collection Time  07/02/13  8:59 PM      Result Value Range   Glucose-Capillary 298 (*) 70 - 99 mg/dL   Comment 1 Notify RN     Comment 2 Documented in Chart    CBC     Status: Abnormal   Collection Time    07/03/13  4:57 AM      Result Value Range   WBC 6.7  4.0 - 10.5 K/uL   RBC 3.27 (*) 3.87 - 5.11 MIL/uL   Hemoglobin 10.0 (*) 12.0 - 15.0 g/dL   HCT 31.4 (*) 36.0 - 46.0 %   MCV 96.0  78.0 - 100.0 fL   MCH 30.6  26.0 - 34.0 pg   MCHC 31.8  30.0 - 36.0 g/dL   RDW 18.4 (*) 11.5 - 15.5 %   Platelets 158  150 - 400 K/uL  BASIC METABOLIC PANEL     Status: Abnormal   Collection Time    07/03/13  4:57 AM      Result Value Range   Sodium 139  137 - 147 mEq/L   Potassium 4.8  3.7 - 5.3 mEq/L   Chloride 105  96 - 112 mEq/L   CO2 20  19 - 32 mEq/L   Glucose, Bld 162 (*) 70 - 99 mg/dL   BUN 86 (*) 6 - 23 mg/dL   Creatinine, Ser 2.93 (*) 0.50 - 1.10 mg/dL   Calcium 8.6  8.4 - 10.5 mg/dL   GFR calc non Af Amer 15 (*) >90 mL/min   GFR calc Af Amer 17 (*) >90 mL/min   Comment: (NOTE)     The eGFR has been calculated using the CKD EPI equation.     This calculation has not been validated in all clinical situations.     eGFR's persistently <90 mL/min signify possible Chronic Kidney     Disease.  PRO B NATRIURETIC PEPTIDE     Status: Abnormal   Collection Time    07/03/13  4:57 AM      Result Value Range   Pro B Natriuretic peptide (BNP) 3715.0 (*) 0 - 125 pg/mL  GLUCOSE, CAPILLARY     Status: Abnormal   Collection Time    07/03/13  7:24 AM      Result Value Range   Glucose-Capillary 159 (*) 70 - 99 mg/dL   Comment 1 Notify RN      Lipid Panel  Recent Labs  07/02/13 0501  CHOL 96  TRIG 66  HDL 49  CHOLHDL 2.0  VLDL 13  LDLCALC 34    Studies/Results: Mr Virgel Paling Wo Contrast  07/01/2013   CLINICAL DATA:  73 year old female with altered mental status and visual changes. Initial encounter. Abnormal MRI.  EXAM: MRA HEAD WITHOUT  CONTRAST  TECHNIQUE: Angiographic images of the Circle of Willis were obtained using MRA technique without intravenous contrast.  COMPARISON:  Brain MRI from the same day reported separately.  FINDINGS: Study is intermittently degraded by motion artifact despite repeated imaging attempts.  Antegrade flow in the posterior circulation, dominant distal right vertebral artery. Vertebrobasilar junction is patent. Both PICA origins appear patent. Basilar artery is patent without stenosis. SCA and PCA origins are patent.  There is mild left PCA P2 segment irregularity with up to moderate focal stenosis (series 100 in 10 image 28). Preserved bilateral distal PCA flow. Posterior communicating arteries are diminutive or absent.  Antegrade flow in both ICA siphons. Cavernous and supra clinoid ICA irregularity exacerbated by motion artifact. Cannot exclude moderate to  severe bilateral anterior genu stenosis.  Patent carotid termini. Dominant right ACA A1 segment. MCA origins are within normal limits. Bilateral MCA and ACA branch detail degraded by motion. No major MCA or ACA branch occlusion. Anterior communicating artery within normal limits.  IMPRESSION: 1. Degraded by motion. 2. Left PCA remains patent with moderate irregularity and P2 segment stenosis. 3. Advanced ICA siphon atherosclerosis. Cannot exclude moderate to severe bilateral supra clinoid ICA stenosis. Carotid termini remain patent.   Electronically Signed   By: Lars Pinks M.D.   On: 07/01/2013 12:01   Mr Brain Wo Contrast  07/01/2013   CLINICAL DATA:  73 year old female with slurred speech weakness confusion and altered mental status. Initial encounter.  EXAM: MRI HEAD WITHOUT CONTRAST  TECHNIQUE: Multiplanar, multiecho pulse sequences of the brain and surrounding structures were obtained without intravenous contrast.  COMPARISON:  Head CT without contrast 0630 hr the same day. Brain MRI 04/17/2013.  FINDINGS: Study is repeat intermittently degraded by motion  artifact despite repeated imaging attempts.  Heterogeneous diffusion in the left occipital lobe inferiorly corresponding to the hypodense abnormality on the recent CT. Little if any restricted diffusion in this area. T2* images demonstrate petechial hemorrhage (series 7, image 20). Associated patchy T2 and FLAIR hyperintensity in this region. No significant mass effect.  No restricted diffusion elsewhere. Major intracranial vascular flow voids are preserved.  Additional patchy and confluent cerebral white matter T2 and FLAIR hyperintensity. Chronic small lacunar infarcts in the thalami, more so the right. No midline shift, mass effect, or evidence of intracranial mass lesion. No ventriculomegaly. Negative pituitary, cervicomedullary junction and visualized cervical spine. Normal bone marrow signal.  Visualized orbit soft tissues are within normal limits. Visualized paranasal sinuses and mastoids are clear. Visualized scalp soft tissues are within normal limits.  IMPRESSION: 1. Subacute appearing left occipital lobe infarct with petechial hemorrhage, corresponding to the earlier CT finding. No associated mass effect. 2. No other acute intracranial abnormality. Stable underlying chronic small vessel disease.   Electronically Signed   By: Lars Pinks M.D.   On: 07/01/2013 11:58   Dg Chest Port 1 View  07/01/2013   CLINICAL DATA:  Stroke  EXAM: PORTABLE CHEST - 1 VIEW  COMPARISON:  Portable exam 1253 hr compared to 07/01/2013 at 0528 hr  FINDINGS: Enlargement of cardiac silhouette with pulmonary vascular congestion.  Mild perihilar edema consistent with CHF.  No pleural effusion or pneumothorax.  Bones unremarkable.  IMPRESSION: CHF, unchanged.   Electronically Signed   By: Lavonia Dana M.D.   On: 07/01/2013 14:30    Medications:  Scheduled Meds: . aspirin  325 mg Oral Daily  . atorvastatin  40 mg Oral q1800  . carvedilol  12.5 mg Oral BID WC  . diltiazem  120 mg Oral Daily  . ferrous sulfate  325 mg Oral Q  breakfast  . heparin  5,000 Units Subcutaneous Q8H  . influenza vac split quadrivalent PF  0.5 mL Intramuscular Tomorrow-1000  . insulin aspart  0-9 Units Subcutaneous TID WC  . insulin glargine  20 Units Subcutaneous QHS  . isosorbide mononitrate  30 mg Oral Daily  . nystatin   Topical BID  . pneumococcal 23 valent vaccine  0.5 mL Intramuscular Tomorrow-1000  . torsemide  20 mg Oral Daily  . Vitamin D (Ergocalciferol)  50,000 Units Oral Q Thu   Continuous Infusions:  PRN Meds:.acetaminophen, senna-docusate     LOS: 2 days   Nekeya Briski A. Merlene Laughter, M.D.  Diplomate, Tax adviser of Psychiatry and  Neurology ( Neurology).

## 2013-07-03 NOTE — Clinical Social Work Note (Signed)
Patient given bed offers ([facility], [facility], [facility], possibly [facility]), patient chooses [facility] ("I have been there before"), Claiborne Billings at [facility] notified, willing to accept patient at discharge.  Says she has 75 rehab days left in her current authorization for rehab.  Edwyna Shell, LCSW Clinical Social Worker [phone])

## 2013-07-03 NOTE — Clinical Social Work Note (Signed)
Patient ready for discharge today to [facility] for rehab, will be transported by family arriving after 2 PM.  Patient aware and agreeable, Kelli from [facility] came to [facility] to have patient complete preadmission paperwork.  Vida Roller will transport discharge packet to facility.  Discharge summary faxed to [facility], [facility] reviewed w RN and updated as needed.  Family notified and agreeable.  CSW signing off as no further SW needs identified.  Edwyna Shell, LCSW Clinical Social Worker [phone])

## 2013-07-03 NOTE — Progress Notes (Signed)
ANTIBIOTIC CONSULT NOTE - INITIAL  Pharmacy Consult for Valtrex Indication: shingles  Allergies  Allergen Reactions  . Codeine Other (See Comments)    Childhood allergy    Patient Measurements: Height: 5\' 3"  (160 cm) Weight: 248 lb 10.9 oz (112.8 kg) IBW/kg (Calculated) : 52.4  Vital Signs: Temp: 97.6 F (36.4 C) (01/29 0856) Temp src: Oral (01/29 0856) BP: 111/76 mmHg (01/29 0856) Pulse Rate: 86 (01/29 0856) Intake/Output from previous day: 01/28 0701 - 01/29 0700 In: 720 [P.O.:720] Out: 1550 [Urine:1550] Intake/Output from this shift: Total I/O In: 240 [P.O.:240] Out: 750 [Urine:750]  Labs:  Recent Labs  07/01/13 0532 07/01/13 0554 07/02/13 0501 07/03/13 0457  WBC 6.8  --  6.8 6.7  HGB 10.1* 10.9* 9.5* 10.0*  PLT 154  --  142* 158  CREATININE 2.68* 2.60* 2.66* 2.93*   Estimated Creatinine Clearance: 21.3 ml/min (by C-G formula based on Cr of 2.93). No results found for this basename: VANCOTROUGH, VANCOPEAK, VANCORANDOM, GENTTROUGH, GENTPEAK, GENTRANDOM, TOBRATROUGH, TOBRAPEAK, TOBRARND, AMIKACINPEAK, AMIKACINTROU, AMIKACIN,  in the last 72 hours   Microbiology: No results found for this or any previous visit (from the past 720 hour(s)).  Medical History: Past Medical History  Diagnosis Date  . Diabetes mellitus   . Hypertension   . Hyperlipemia   . Fibromyalgia   . Obesity   . Sleep apnea     No CPAP  . Anxiety   . Hiatal hernia   . NSTEMI (non-ST elevated myocardial infarction) 04/27/2011  . Anemia 04/27/2011  . Restless leg syndrome   . CAD (coronary artery disease)     small dominant right coronary artery with diffuse 95% stenosis. The LAD had 70% stenosis in the mid vessel beyond the diagonal branch but was a small vessel. Circumflex had proximal 30% stenosis.   Marland Kitchen External hemorrhoids   . Arrhythmia 10/11/2011  . Community acquired pneumonia 06/24/2012  . Renal insufficiency 05/06/2011  . Shingles     Medications:  Scheduled:  . apixaban   5 mg Oral BID  . atorvastatin  40 mg Oral q1800  . carvedilol  12.5 mg Oral BID WC  . diltiazem  120 mg Oral Daily  . ferrous sulfate  325 mg Oral Q breakfast  . insulin aspart  0-9 Units Subcutaneous TID WC  . insulin glargine  20 Units Subcutaneous QHS  . isosorbide mononitrate  30 mg Oral Daily  . nystatin   Topical BID  . torsemide  20 mg Oral Daily  . valACYclovir  1,000 mg Oral TID  . Vitamin D (Ergocalciferol)  50,000 Units Oral Q Thu   Assessment: 73 yo F with shingles on her scalp.  She was started on Valtrex prior to admission.  She only received a couple doses (1/26>>1/27).    This was prescribed as TID, however given patient's current renal function dose should be reduced to daily for CrCl<46ml/min.  (normalized CrCl ~70ml/min)  Goal of Therapy:  Eradicate infection.  Plan:  Valtrex 1gm po daily x 7 days  Continue to monitor renal function  Brynda Heick, Lavonia Drafts 07/03/2013,11:40 AM

## 2013-07-03 NOTE — Discharge Summary (Signed)
Physician Discharge Summary  [name] [id number] DOB: 1941/05/23 DOA: 07/01/2013  PCP: Brenda Mustache, MD  Admit date: 07/01/2013 Discharge date: 07/03/2013  Recommendations for Outpatient Follow-up:  1. Please note that per cardiology we start you on blood thinner apixaban. Please monitor for any signs of bleed and recheck CBC  To make sure hemoglobin remains stable.  2. May continue valacyclovir for 7 days for herpes lesions.  3. Nystatin powder to affected area for at least 10-14 days until erythema on affected area improves. 4. EEG results pending on discharge so please follow up on results   Discharge Diagnoses: CVA Principal Problem:   CVA (cerebral infarction) Active Problems:   CAD (coronary artery disease)   DM type 2, uncontrolled, with neuropathy   Slurred speech   Restless leg   Atrial fibrillation with rapid ventricular response  Discharge Condition: medically stable for discharge to SNF today  Diet recommendation: heart healthy   History of present illness:  73 y.o. female with PMH of HTN, HPL, CAD, DM, h/o CVA presented with slurred speech, confusion, gait ataxia that started one day prior to this admission and per family she had several days of gradual decline including the inability to ambulate safely, of note she had a fall a couple of weeks ago. In ED, pt found to be confused, no chest pain, no SOB, no fever, chills, no n/v/d reported per family members. ED work up showed subacute CVA and [facility] asked to admit for further evaluation.   Assessment/Plan:   Principal Problem:  CVA (cerebral infarction)  - appreciate neurology and cardiology input  - given new stroke, anticoagulation is recommended and per cardio rec's, apixaban will be started  - PT evaluation recommends SNF; plan for SNF today - risk factor control as possible  - per neurology., plan on obtaining EEG to rule out possibility of seizures --> results pending upon discharge, so will need to  follow up on results   Active Problems:  CAD (coronary artery disease)  - Significant stenosis of RCA per cardiac cath in 2012  - this is too small for intervention so medical management recommended at that time  DM type 2, uncontrolled, with neuropathy  - reasonable inpatient control  - pt is on Lantus at home 40 U QHS, we started here half home dose 20 U QHS as pt's oral intake was low - CBG's in past 24 hours: 203, 147, 276  - continue home regimen upon discharge 40 U QHS along with sliding scale as indicated  Chronic diastolic CHF  - pulmonary edema  - clinically stable, weight 248 lbs this AM and stable over 48 hours  - continue torsemide  Anemia of chronic disease  - likely from diabetes, Hg and Hct stable over 24 hours  - no signs of active bleeding Acute on chronic renal failure  - Cr slightly above her last baseline November 2014 (Cr = 2.56)  - repeat BMP in 1-2 weeks  HTN  - continue Coreg, Imdur, Cardizem, Torsemide  HLD  - continue statin  Atrial fibrillation with rapid ventricular response  - rate controlled this AM  - there is questionable medical non compliance  - restarted on carvedilol BID  - per ECHO in 2012, normal EF with grade II diastolic dysfunction, 2 D ECHO re ordered this admission, will need to follow up on results  - CHADs-Vasc score- 6. Per Card's anticoagulation as noted above  Herpes lesions - valacyclovir for 7 days on discharge  Studies:  Ct Head Wo Contrast 07/01/2013 New left inferomedial occipital patchy hypodensity suggesting subacute infarct in left posterior cerebral artery territory. Involutional changes. Moderate white matter changes, chronic small vessel ischemic disease.  Mr [name] Head Wo Contrast 07/01/2013 Degraded by motion. Left PCA remains patent with moderate irregularity and P2 segment stenosis. Advanced ICA siphon atherosclerosis. Cannot exclude moderate to severe bilateral supra clinoid ICA stenosis. Carotid termini remain patent.   Mr Brain Wo Contrast 07/01/2013 Subacute appearing left occipital lobe infarct with petechial hemorrhage, corresponding to the earlier CT finding. No associated mass effect. No other acute intracranial abnormality. Stable underlying chronic small vessel disease.  Dg Chest Port 1 View 07/01/2013 CHF, unchanged.  Dg Chest Port 1 View 07/01/2013 Stable cardiomegaly, increasing interstitial pulmonary edema.   Code Status: Full  Family Communication: pt at bedside    Consultants:  Neurology  Cardiology  Antibiotics:  Valacyclovir for 7 days on discharge    Signed:  Leisa Lenz, MD  [facility] 07/03/2013, 11:24 AM  Pager #: [phone]   Discharge Exam: Filed Vitals:   07/03/13 0856  BP: 111/76  Pulse: 86  Temp: 97.6 F (36.4 C)  Resp: 20   Filed Vitals:   07/02/13 2101 07/03/13 0000 07/03/13 0300 07/03/13 0856  BP: 122/60 134/68 93/59 111/76  Pulse: 75 76 76 86  Temp: 97.7 F (36.5 C) 97.3 F (36.3 C) 97.5 F (36.4 C) 97.6 F (36.4 C)  TempSrc: Oral Oral Oral Oral  Resp: 24 20 20 20   Height:      Weight:      SpO2: 100% 98% 100% 98%    General: Pt is alert, follows some commands appropriately, not in acute distress Cardiovascular: Irregular rate and rhythm, no rubs, no gallops Respiratory: Clear to auscultation bilaterally, no wheezing, no crackles, no rhonchi Abdominal: Soft, non tender, non distended, bowel sounds +, no guarding Extremities: no edema, no cyanosis, pulses palpable bilaterally DP and PT  Discharge Instructions  Discharge Orders   Future Appointments Provider Department Dept Phone   07/03/2013 3:00 PM [facility] [phone]   Future Orders Complete By Expires   Call MD for:  difficulty breathing, headache or visual disturbances  As directed    Call MD for:  persistant dizziness or light-headedness  As directed    Call MD for:  persistant nausea and vomiting  As directed    Call MD for:  severe  uncontrolled pain  As directed    Diet - low sodium heart healthy  As directed    Discharge instructions  As directed    Comments:     1. Please note that per cardiology we start you on blood thinner apixaban. Please monitor for any signs of bleed and recheck CBC  To make sure hemoglobin remains stable.  2. May continue valacyclovir for 7 days for herpes lesions.  3. Nystatin powder to affected area for at least 10-14 days until erythema on affected area improves.   Increase activity slowly  As directed        Medication List    STOP taking these medications       aspirin EC 81 MG tablet      TAKE these medications       acetaminophen 325 MG tablet  Commonly known as:  TYLENOL  Take 2 tablets (650 mg total) by mouth every 6 (six) hours as needed for mild pain.     apixaban 5 MG Tabs tablet  Commonly known as:  ELIQUIS  Take 1 tablet (5 mg total) by mouth 2 (two) times daily.     atorvastatin 80 MG tablet  Commonly known as:  LIPITOR  Take 80 mg by mouth at bedtime.     carvedilol 12.5 MG tablet  Commonly known as:  COREG  Take 1 tablet (12.5 mg total) by mouth 2 (two) times daily with a meal.     Cyanocobalamin 500 MCG Subl  Place 1 tablet under the tongue daily.     diltiazem 120 MG 24 hr capsule  Commonly known as:  CARDIZEM CD  Take 1 capsule (120 mg total) by mouth daily.     ferrous sulfate 325 (65 FE) MG tablet  Take 325 mg by mouth 2 (two) times daily.     fluocinonide cream 0.05 %  Commonly known as:  LIDEX  Apply 1 application topically 2 (two) times daily.     HUMALOG KWIKPEN 100 UNIT/ML KiwkPen  Generic drug:  insulin lispro  Inject 10 Units into the skin 3 (three) times daily with meals.     insulin aspart 100 UNIT/ML injection  Commonly known as:  novoLOG  Inject 0-9 Units into the skin 3 (three) times daily with meals.     isosorbide mononitrate 30 MG 24 hr tablet  Commonly known as:  IMDUR  Take 30 mg by mouth daily.     LANTUS SOLOSTAR 100  UNIT/ML Solostar Pen  Generic drug:  Insulin Glargine  Inject 40 Units into the skin at bedtime.     nitroGLYCERIN 0.4 MG SL tablet  Commonly known as:  NITROSTAT  Place 1 mg under the tongue every 5 (five) minutes x 3 doses as needed for chest pain. For chest pain     nystatin 100000 UNIT/GM Powd  Apply 1 Bottle topically 2 (two) times daily.     pregabalin 150 MG capsule  Commonly known as:  LYRICA  Take 150 mg by mouth 2 (two) times daily.     senna-docusate 8.6-50 MG per tablet  Commonly known as:  Senokot-S  Take 1 tablet by mouth at bedtime as needed for mild constipation.     torsemide 20 MG tablet  Commonly known as:  DEMADEX  Take 2 tablets (40 mg total) by mouth daily.     valACYclovir 1000 MG tablet  Commonly known as:  VALTREX  Take 1,000 mg by mouth 3 (three) times daily. For 7 days     Vitamin D (Ergocalciferol) 50000 UNITS Caps capsule  Commonly known as:  DRISDOL  Take 50,000 Units by mouth every Thursday.           Follow-up Information   Follow up with Brenda Mustache, MD. Schedule an appointment as soon as possible for a visit in 2 weeks.   Specialty:  Family Medicine   Contact information:   [place] [address] [phone]        The results of significant diagnostics from this hospitalization (including imaging, microbiology, ancillary and laboratory) are listed below for reference.    Significant Diagnostic Studies: Ct Head Wo Contrast  07/01/2013   CLINICAL DATA:  Slurred speech and altered mental status. Weakness and hyperglycemia.  EXAM: CT HEAD WITHOUT CONTRAST  TECHNIQUE: Contiguous axial images were obtained from the base of the skull through the vertex without intravenous contrast.  COMPARISON:  CT of the head April 17, 2013. MRI of the brain April 17, 2013.  FINDINGS: The ventricles and sulci are normal for age. New left inferomedial  occipital patchy hypodensity. No intraparenchymal hemorrhage, mass effect  nor midline shift. Patchy to confluent supratentorial white matter hypodensities are within normal range for patient's age and though non-specific suggest sequelae of chronic small vessel ischemic disease. No acute large vascular territory infarcts.  No abnormal extra-axial fluid collections. Basal cisterns are patent. Moderate calcific atherosclerosis of the carotid siphons and included vertebral arteries.  No skull fracture. Visualized paranasal sinuses and mastoid aircells are well-aerated. The included ocular globes and orbital contents are non-suspicious.  IMPRESSION: New left inferomedial occipital patchy hypodensity suggesting subacute infarct in left posterior cerebral artery territory.  Involutional changes. Moderate white matter changes suggest chronic small vessel ischemic disease.  Findings discussed with and reconfirmed by Dr. Sabra Heck on July 01, 2013 at approximately 0650 hr.   Electronically Signed   By: Elon Alas   On: 07/01/2013 06:57   Mr [name] Head Wo Contrast  07/01/2013   CLINICAL DATA:  73 year old female with altered mental status and visual changes. Initial encounter. Abnormal MRI.  EXAM: MRA HEAD WITHOUT CONTRAST  TECHNIQUE: Angiographic images of the Circle of Willis were obtained using MRA technique without intravenous contrast.  COMPARISON:  Brain MRI from the same day reported separately.  FINDINGS: Study is intermittently degraded by motion artifact despite repeated imaging attempts.  Antegrade flow in the posterior circulation, dominant distal right vertebral artery. Vertebrobasilar junction is patent. Both PICA origins appear patent. Basilar artery is patent without stenosis. SCA and PCA origins are patent.  There is mild left PCA P2 segment irregularity with up to moderate focal stenosis (series 100 in 10 image 28). Preserved bilateral distal PCA flow. Posterior communicating arteries are diminutive or absent.  Antegrade flow in both ICA siphons. Cavernous and supra  clinoid ICA irregularity exacerbated by motion artifact. Cannot exclude moderate to severe bilateral anterior genu stenosis.  Patent carotid termini. Dominant right ACA A1 segment. MCA origins are within normal limits. Bilateral MCA and ACA branch detail degraded by motion. No major MCA or ACA branch occlusion. Anterior communicating artery within normal limits.  IMPRESSION: 1. Degraded by motion. 2. Left PCA remains patent with moderate irregularity and P2 segment stenosis. 3. Advanced ICA siphon atherosclerosis. Cannot exclude moderate to severe bilateral supra clinoid ICA stenosis. Carotid termini remain patent.   Electronically Signed   By: Lars Pinks M.D.   On: 07/01/2013 12:01   Mr Brain Wo Contrast  07/01/2013   CLINICAL DATA:  73 year old female with slurred speech weakness confusion and altered mental status. Initial encounter.  EXAM: MRI HEAD WITHOUT CONTRAST  TECHNIQUE: Multiplanar, multiecho pulse sequences of the brain and surrounding structures were obtained without intravenous contrast.  COMPARISON:  Head CT without contrast 0630 hr the same day. Brain MRI 04/17/2013.  FINDINGS: Study is repeat intermittently degraded by motion artifact despite repeated imaging attempts.  Heterogeneous diffusion in the left occipital lobe inferiorly corresponding to the hypodense abnormality on the recent CT. Little if any restricted diffusion in this area. T2* images demonstrate petechial hemorrhage (series 7, image 20). Associated patchy T2 and FLAIR hyperintensity in this region. No significant mass effect.  No restricted diffusion elsewhere. Major intracranial vascular flow voids are preserved.  Additional patchy and confluent cerebral white matter T2 and FLAIR hyperintensity. Chronic small lacunar infarcts in the thalami, more so the right. No midline shift, mass effect, or evidence of intracranial mass lesion. No ventriculomegaly. Negative pituitary, cervicomedullary junction and visualized cervical spine.  Normal bone marrow signal.  Visualized orbit soft tissues are within normal limits.  Visualized paranasal sinuses and mastoids are clear. Visualized scalp soft tissues are within normal limits.  IMPRESSION: 1. Subacute appearing left occipital lobe infarct with petechial hemorrhage, corresponding to the earlier CT finding. No associated mass effect. 2. No other acute intracranial abnormality. Stable underlying chronic small vessel disease.   Electronically Signed   By: Lars Pinks M.D.   On: 07/01/2013 11:58   Dg Chest Port 1 View  07/01/2013   CLINICAL DATA:  Stroke  EXAM: PORTABLE CHEST - 1 VIEW  COMPARISON:  Portable exam 1253 hr compared to 07/01/2013 at 0528 hr  FINDINGS: Enlargement of cardiac silhouette with pulmonary vascular congestion.  Mild perihilar edema consistent with CHF.  No pleural effusion or pneumothorax.  Bones unremarkable.  IMPRESSION: CHF, unchanged.   Electronically Signed   By: Lavonia Dana M.D.   On: 07/01/2013 14:30   Dg Chest Port 1 View  07/01/2013   CLINICAL DATA:  Chest pain, hyperglycemia and weakness.  EXAM: PORTABLE CHEST - 1 VIEW  COMPARISON:  Chest radiograph April 17, 2013  FINDINGS: Cardiac silhouette remains moderately enlarged, mediastinal silhouette is unremarkable. Increasing interstitial prominence with central pulmonary vasculature engorgement. No pleural effusions or focal consolidations. No pneumothorax. Large body habitus. Osseous structures are nonsuspicious.  IMPRESSION: Stable cardiomegaly, increasing interstitial pulmonary edema.   Electronically Signed   By: Elon Alas   On: 07/01/2013 05:45    Microbiology: No results found for this or any previous visit (from the past 240 hour(s)).   Labs: Basic Metabolic Panel:  Recent Labs Lab 07/01/13 0532 07/01/13 0554 07/02/13 0501 07/03/13 0457  NA 141 142 141 139  K 4.7 4.6 4.2 4.8  CL 106 111 107 105  CO2 20  --  19 20  GLUCOSE 261* 249* 163* 162*  BUN 88* 95* 79* 86*  CREATININE 2.68*  2.60* 2.66* 2.93*  CALCIUM 8.8  --  8.5 8.6  MG 2.3  --   --   --    Liver Function Tests:  Recent Labs Lab 07/01/13 0532  AST 29  ALT 45*  ALKPHOS 83  BILITOT 0.2*  PROT 7.1  ALBUMIN 2.9*    Recent Labs Lab 07/01/13 0532 07/02/13 1005  AMMONIA 54 38   CBC:  Recent Labs Lab 07/01/13 0532 07/01/13 0554 07/02/13 0501 07/03/13 0457  WBC 6.8  --  6.8 6.7  NEUTROABS 3.9  --   --   --   HGB 10.1* 10.9* 9.5* 10.0*  HCT 30.8* 32.0* 28.5* 31.4*  MCV 95.1  --  95.6 96.0  PLT 154  --  142* 158   Cardiac Enzymes:  Recent Labs Lab 07/01/13 0532  TROPONINI <0.30   BNP: BNP (last 3 results)  Recent Labs  04/13/13 1325 07/01/13 0552 07/03/13 0457  PROBNP 4581.0* 4028.0* 3715.0*   CBG:  Recent Labs Lab 07/02/13 0753 07/02/13 1138 07/02/13 1717 07/02/13 2059 07/03/13 0724  GLUCAP 147* 276* 174* 298* 159*    Time coordinating discharge: Over 30 minutes

## 2013-07-18 NOTE — Telephone Encounter (Signed)
No other info °

## 2013-09-08 ENCOUNTER — Ambulatory Visit: Payer: Medicare Other | Admitting: Neurology

## 2013-09-09 ENCOUNTER — Encounter (INDEPENDENT_AMBULATORY_CARE_PROVIDER_SITE_OTHER): Payer: Self-pay

## 2013-09-09 ENCOUNTER — Encounter: Payer: Self-pay | Admitting: Neurology

## 2013-09-09 ENCOUNTER — Ambulatory Visit (INDEPENDENT_AMBULATORY_CARE_PROVIDER_SITE_OTHER): Payer: Medicare Other | Admitting: Neurology

## 2013-09-09 VITALS — BP 152/84 | HR 115 | Ht 59.0 in | Wt 249.0 lb

## 2013-09-09 DIAGNOSIS — I6350 Cerebral infarction due to unspecified occlusion or stenosis of unspecified cerebral artery: Secondary | ICD-10-CM

## 2013-09-09 NOTE — Progress Notes (Signed)
[facility] [address] [place]. [place] [address] [phone]       OFFICE CONSULT NOTE  Ms. ANETTE BARRA Date of Birth:  01-17-1941 Medical Record Number:  [id number]   Referring MD:  Dione Housekeeper  Reason for Referral:  Stroke  HPI: 40 year obese Caucasian lady who noticed difficulty with peripheral vision on the right in mid-March as well as some slurred speech but did not seek images medical help. A week later she was admitted with an episode of unresponsiveness at [facility] and diagnosed with hypoglycemia. During that hospitalization she underwent an MRI scan of the brain which I personally reviewed and  showed a subacute left occipital posterior cerebral artery infarct. Carotid ultrasound showed 40-59% proximal right ICA stenosis. She was found to be in atrial fibrillation with rapid ventricular rate. She is started on [place] second of stroke prevention. She states she's done well she is living at home and getting home physical and occupational therapy. She still has some mild peripheral vision on the right. She is able to ambulate with a walker. She has not noticed any memory difficulties or speech problems. She denies any other prior history of strokes or TIAs. She does have significant vascular risk factors in the form of diabetes, heart disease, obesity.  ROS:   14 system review of systems is positive for weight gain, chest pain, shortness of breath, increased thirst and all the systems negative  PMH:  Past Medical History  Diagnosis Date  . Diabetes mellitus   . Hypertension   . Hyperlipemia   . Fibromyalgia   . Obesity   . Sleep apnea     No CPAP  . Anxiety   . Hiatal hernia   . NSTEMI (non-ST elevated myocardial infarction) 04/27/2011  . Anemia 04/27/2011  . Restless leg syndrome   . CAD (coronary artery disease)     small dominant right coronary artery with diffuse 95% stenosis. The LAD had 70% stenosis in the mid vessel beyond the  diagonal branch but was a small vessel. Circumflex had proximal 30% stenosis.   Marland Kitchen External hemorrhoids   . Arrhythmia Atrial Fibrillation 10/11/2011  . Community acquired pneumonia 06/24/2012  . Renal insufficiency 05/06/2011  . Shingles     Social History:  History   Social History  . Marital Status: Widowed    Spouse Name: N/A    Number of Children: 4  . Years of Education: 12th   Occupational History  . Retired    Social History Main Topics  . Smoking status: Never Smoker   . Smokeless tobacco: Never Used  . Alcohol Use: No  . Drug Use: No  . Sexual Activity: No   Other Topics Concern  . Not on file   Social History Narrative   Patient lives at home with son    Medications:   Current Outpatient Prescriptions on File Prior to Visit  Medication Sig Dispense Refill  . acetaminophen (TYLENOL) 325 MG tablet Take 2 tablets (650 mg total) by mouth every 6 (six) hours as needed for mild pain.  30 tablet  0  . apixaban (ELIQUIS) 5 MG TABS tablet Take 1 tablet (5 mg total) by mouth 2 (two) times daily.  60 tablet  0  . atorvastatin (LIPITOR) 80 MG tablet Take 80 mg by mouth at bedtime.      . carvedilol (COREG) 12.5 MG tablet Take 1 tablet (12.5 mg total) by mouth 2 (two) times daily with a meal.  60  tablet  0  . Cyanocobalamin 500 MCG SUBL Place 1 tablet under the tongue daily.      Marland Kitchen diltiazem (CARDIZEM CD) 120 MG 24 hr capsule Take 1 capsule (120 mg total) by mouth daily.  30 capsule  0  . ferrous sulfate 325 (65 FE) MG tablet Take 325 mg by mouth 2 (two) times daily.      . fluocinonide cream (LIDEX) 5.45 % Apply 1 application topically 2 (two) times daily.      . insulin aspart (NOVOLOG) 100 UNIT/ML injection Inject 0-9 Units into the skin 3 (three) times daily with meals.  10 mL  11  . Insulin Glargine (LANTUS SOLOSTAR) 100 UNIT/ML Solostar Pen Inject 40 Units into the skin at bedtime.      . insulin lispro (HUMALOG KWIKPEN) 100 UNIT/ML KiwkPen Inject 10 Units into the skin  3 (three) times daily with meals.      . isosorbide mononitrate (IMDUR) 30 MG 24 hr tablet Take 30 mg by mouth daily.      . nitroGLYCERIN (NITROSTAT) 0.4 MG SL tablet Place 1 mg under the tongue every 5 (five) minutes x 3 doses as needed for chest pain. For chest pain      . nystatin (MYCOSTATIN/NYSTOP) 100000 UNIT/GM POWD Apply 1 Bottle topically 2 (two) times daily.  15 g  0  . pregabalin (LYRICA) 150 MG capsule Take 150 mg by mouth 2 (two) times daily.      Marland Kitchen senna-docusate (SENOKOT-S) 8.6-50 MG per tablet Take 1 tablet by mouth at bedtime as needed for mild constipation.  30 tablet  0  . torsemide (DEMADEX) 20 MG tablet Take 2 tablets (40 mg total) by mouth daily.  60 tablet  0  . Vitamin D, Ergocalciferol, (DRISDOL) 50000 UNITS CAPS capsule Take 50,000 Units by mouth every Thursday.       No current facility-administered medications on file prior to visit.    Allergies:   Allergies  Allergen Reactions  . Codeine Other (See Comments)    Childhood allergy    Physical Exam General: obese middle aged lady, seated, in no evident distress Head: head normocephalic and atraumatic. Orohparynx benign Neck: supple with no carotid or supraclavicular bruits Cardiovascular: regular rate and rhythm, no murmurs Musculoskeletal: no deformity Skin:  no rash/petichiae Vascular:  Normal pulses all extremities Filed Vitals:   09/09/13 1305  BP: 152/84  Pulse: 115    Neurologic Exam Mental Status: Awake and fully alert. Oriented to place and time. Recent and remote memory intact. Attention span, concentration and fund of knowledge appropriate. Mood and affect appropriate. Diminished recall 1/3 Cranial Nerves: Fundoscopic exam reveals sharp disc margins. Pupils equal, briskly reactive to light. Extraocular movements full without nystagmus. Visual fields show partial right homonymous hemianopsia to confrontation. Hearing intact. Facial sensation intact. Face, tongue, palate moves normally and  symmetrically.  Motor: Normal bulk and tone. Normal strength in all tested extremity muscles. Sensory.: intact to touch and pinprick and  Diminished vibratory and position sense over toes bilaterally..  Coordination: Rapid alternating movements normal in all extremities. Finger-to-nose and heel-to-shin performed accurately bilaterally. Gait and Station: Arises from chair with mild difficulty. Stance is broad based. Gait demonstrates slight broad base Reflexes: 1+ and symmetric. Toes downgoing.   NIHSS  1 Modified Rankin 2   ASSESSMENT: 72 year subacute left occipital infarct in March 20 15th likely from the onset atrial fibrillation with rapid ventricular response. Multiple vascular risk factors of diabetes, heart disease, atrial fibrillation and obesity.  PLAN: I had a long discussion with the patient and her son regarding a recent subacute stroke, it ablation and risk reduction and need for long-term anticoagulation and risk factor control. Continue to expand her second stroke prevention and strict control of diabetes with hemoglobin A1c goal below 6.5%, lipids with LDL cholesterol goal below 7 mg percent and hypertension with blood pressure goal below 130/90. I also advised her to if possible diet, exercise and lose weight. Check transthoracic echocardiogram and transcranial Doppler studies. I advised him not to drive to the peripheral vision improves. Return for followup in 2 months with Jeani Hawking, nurse practitioner call earlier if necessary    Note: This document was prepared with digital dictation and possible smart phrase technology. Any transcriptional errors that result from this process are unintentional.

## 2013-09-09 NOTE — Patient Instructions (Signed)
I had a long discussion with the patient and her son regarding a recent subacute stroke, it ablation and risk reduction and need for long-term anticoagulation and risk factor control. Continue to expand her second stroke prevention and strict control of diabetes with hemoglobin A1c goal below 6.5%, lipids with LDL cholesterol goal below 7 mg percent and hypertension with blood pressure goal below 130/90. I also advised her to if possible diet, exercise and lose weight. Check transthoracic echocardiogram and transcranial Doppler studies. I advised him not to drive to the peripheral vision improves. Return for followup in 2 months with Jeani Hawking, nurse practitioner call earlier if necessary  Stroke Prevention Some medical conditions and behaviors are associated with an increased chance of having a stroke. You may prevent a stroke by making healthy choices and managing medical conditions. HOW CAN I REDUCE MY RISK OF HAVING A STROKE?   Stay physically active. Get at least 30 minutes of activity on most or all days.  Do not smoke. It may also be helpful to avoid exposure to secondhand smoke.  Limit alcohol use. Moderate alcohol use is considered to be:  No more than 2 drinks per day for men.  No more than 1 drink per day for nonpregnant women.  Eat healthy foods. This involves  Eating 5 or more servings of fruits and vegetables a day.  Following a diet that addresses high blood pressure (hypertension), high cholesterol, diabetes, or obesity.  Manage your cholesterol levels.  A diet low in saturated fat, trans fat, and cholesterol and high in fiber may control cholesterol levels.  Take any prescribed medicines to control cholesterol as directed by your health care provider.  Manage your diabetes.  A controlled-carbohydrate, controlled-sugar diet is recommended to manage diabetes.  Take any prescribed medicines to control diabetes as directed by your health care provider.  Control your  hypertension.  A low-salt (sodium), low-saturated fat, low-trans fat, and low-cholesterol diet is recommended to manage hypertension.  Take any prescribed medicines to control hypertension as directed by your health care provider.  Maintain a healthy weight.  A reduced-calorie, low-sodium, low-saturated fat, low-trans fat, low-cholesterol diet is recommended to manage weight.  Stop drug abuse.  Avoid taking birth control pills.  Talk to your health care provider about the risks of taking birth control pills if you are over 39 years old, smoke, get migraines, or have ever had a blood clot.  Get evaluated for sleep disorders (sleep apnea).  Talk to your health care provider about getting a sleep evaluation if you snore a lot or have excessive sleepiness.  Take medicines as directed by your health care provider.  For some people, aspirin or blood thinners (anticoagulants) are helpful in reducing the risk of forming abnormal blood clots that can lead to stroke. If you have the irregular heart rhythm of atrial fibrillation, you should be on a blood thinner unless there is a good reason you cannot take them.  Understand all your medicine instructions.  Make sure that other other conditions (such as anemia or atherosclerosis) are addressed. SEEK IMMEDIATE MEDICAL CARE IF:   You have sudden weakness or numbness of the face, arm, or leg, especially on one side of the body.  Your face or eyelid droops to one side.  You have sudden confusion.  You have trouble speaking (aphasia) or understanding.  You have sudden trouble seeing in one or both eyes.  You have sudden trouble walking.  You have dizziness.  You have a loss of balance  or coordination.  You have a sudden, severe headache with no known cause.  You have new chest pain or an irregular heartbeat. Any of these symptoms may represent a serious problem that is an emergency. Do not wait to see if the symptoms will go away. Get  medical help at once. Call your local emergency services  (911 in [place] Do not drive yourself to the hospital. Document Released: 06/29/2004 Document Revised: 03/12/2013 Document Reviewed: 11/22/2012 [facility] Patient Information 2014 [place].

## 2013-09-18 ENCOUNTER — Other Ambulatory Visit: Payer: Self-pay | Admitting: *Deleted

## 2013-09-18 DIAGNOSIS — T6701XA Heatstroke and sunstroke, initial encounter: Secondary | ICD-10-CM

## 2013-09-18 DIAGNOSIS — I639 Cerebral infarction, unspecified: Secondary | ICD-10-CM

## 2013-09-19 ENCOUNTER — Ambulatory Visit (INDEPENDENT_AMBULATORY_CARE_PROVIDER_SITE_OTHER): Payer: Medicare Other

## 2013-09-19 DIAGNOSIS — I6350 Cerebral infarction due to unspecified occlusion or stenosis of unspecified cerebral artery: Secondary | ICD-10-CM

## 2013-09-26 ENCOUNTER — Ambulatory Visit (HOSPITAL_COMMUNITY): Payer: Medicare Other | Attending: Cardiology | Admitting: Radiology

## 2013-09-26 ENCOUNTER — Encounter: Payer: Self-pay | Admitting: Cardiology

## 2013-09-26 DIAGNOSIS — I4891 Unspecified atrial fibrillation: Secondary | ICD-10-CM

## 2013-09-26 DIAGNOSIS — I639 Cerebral infarction, unspecified: Secondary | ICD-10-CM

## 2013-09-26 DIAGNOSIS — I6350 Cerebral infarction due to unspecified occlusion or stenosis of unspecified cerebral artery: Secondary | ICD-10-CM

## 2013-09-26 DIAGNOSIS — Z8673 Personal history of transient ischemic attack (TIA), and cerebral infarction without residual deficits: Secondary | ICD-10-CM | POA: Insufficient documentation

## 2013-09-26 DIAGNOSIS — I6529 Occlusion and stenosis of unspecified carotid artery: Secondary | ICD-10-CM | POA: Insufficient documentation

## 2013-09-26 DIAGNOSIS — G459 Transient cerebral ischemic attack, unspecified: Secondary | ICD-10-CM

## 2013-09-26 NOTE — Progress Notes (Signed)
Echocardiogram performed.  

## 2013-10-02 ENCOUNTER — Emergency Department (HOSPITAL_COMMUNITY): Payer: Medicare Other

## 2013-10-02 ENCOUNTER — Encounter (HOSPITAL_COMMUNITY): Payer: Self-pay | Admitting: Emergency Medicine

## 2013-10-02 ENCOUNTER — Inpatient Hospital Stay (HOSPITAL_COMMUNITY)
Admission: EM | Admit: 2013-10-02 | Discharge: 2013-10-07 | DRG: 291 | Disposition: A | Discharge status: Home / Self Care | Payer: Medicare Other | Attending: Internal Medicine | Admitting: Internal Medicine

## 2013-10-02 DIAGNOSIS — E1140 Type 2 diabetes mellitus with diabetic neuropathy, unspecified: Secondary | ICD-10-CM | POA: Diagnosis present

## 2013-10-02 DIAGNOSIS — E1142 Type 2 diabetes mellitus with diabetic polyneuropathy: Secondary | ICD-10-CM | POA: Diagnosis present

## 2013-10-02 DIAGNOSIS — N184 Chronic kidney disease, stage 4 (severe): Secondary | ICD-10-CM | POA: Diagnosis present

## 2013-10-02 DIAGNOSIS — I499 Cardiac arrhythmia, unspecified: Secondary | ICD-10-CM

## 2013-10-02 DIAGNOSIS — Z800 Family history of malignant neoplasm of digestive organs: Secondary | ICD-10-CM

## 2013-10-02 DIAGNOSIS — S40019A Contusion of unspecified shoulder, initial encounter: Secondary | ICD-10-CM | POA: Diagnosis present

## 2013-10-02 DIAGNOSIS — E1165 Type 2 diabetes mellitus with hyperglycemia: Secondary | ICD-10-CM | POA: Diagnosis present

## 2013-10-02 DIAGNOSIS — G2581 Restless legs syndrome: Secondary | ICD-10-CM | POA: Diagnosis present

## 2013-10-02 DIAGNOSIS — R1312 Dysphagia, oropharyngeal phase: Secondary | ICD-10-CM | POA: Diagnosis present

## 2013-10-02 DIAGNOSIS — R4781 Slurred speech: Secondary | ICD-10-CM

## 2013-10-02 DIAGNOSIS — R079 Chest pain, unspecified: Secondary | ICD-10-CM | POA: Diagnosis present

## 2013-10-02 DIAGNOSIS — E785 Hyperlipidemia, unspecified: Secondary | ICD-10-CM | POA: Diagnosis present

## 2013-10-02 DIAGNOSIS — I4891 Unspecified atrial fibrillation: Secondary | ICD-10-CM | POA: Diagnosis present

## 2013-10-02 DIAGNOSIS — IMO0002 Reserved for concepts with insufficient information to code with codable children: Secondary | ICD-10-CM | POA: Diagnosis present

## 2013-10-02 DIAGNOSIS — E872 Acidosis: Secondary | ICD-10-CM

## 2013-10-02 DIAGNOSIS — Z7901 Long term (current) use of anticoagulants: Secondary | ICD-10-CM

## 2013-10-02 DIAGNOSIS — I119 Hypertensive heart disease without heart failure: Secondary | ICD-10-CM

## 2013-10-02 DIAGNOSIS — E1149 Type 2 diabetes mellitus with other diabetic neurological complication: Secondary | ICD-10-CM | POA: Diagnosis present

## 2013-10-02 DIAGNOSIS — R404 Transient alteration of awareness: Secondary | ICD-10-CM | POA: Diagnosis not present

## 2013-10-02 DIAGNOSIS — I214 Non-ST elevation (NSTEMI) myocardial infarction: Secondary | ICD-10-CM

## 2013-10-02 DIAGNOSIS — IMO0001 Reserved for inherently not codable concepts without codable children: Secondary | ICD-10-CM | POA: Diagnosis present

## 2013-10-02 DIAGNOSIS — I252 Old myocardial infarction: Secondary | ICD-10-CM

## 2013-10-02 DIAGNOSIS — Z885 Allergy status to narcotic agent status: Secondary | ICD-10-CM

## 2013-10-02 DIAGNOSIS — Z8673 Personal history of transient ischemic attack (TIA), and cerebral infarction without residual deficits: Secondary | ICD-10-CM

## 2013-10-02 DIAGNOSIS — R748 Abnormal levels of other serum enzymes: Secondary | ICD-10-CM

## 2013-10-02 DIAGNOSIS — Z9981 Dependence on supplemental oxygen: Secondary | ICD-10-CM

## 2013-10-02 DIAGNOSIS — I5033 Acute on chronic diastolic (congestive) heart failure: Principal | ICD-10-CM | POA: Diagnosis present

## 2013-10-02 DIAGNOSIS — R05 Cough: Secondary | ICD-10-CM

## 2013-10-02 DIAGNOSIS — K449 Diaphragmatic hernia without obstruction or gangrene: Secondary | ICD-10-CM | POA: Diagnosis present

## 2013-10-02 DIAGNOSIS — E119 Type 2 diabetes mellitus without complications: Secondary | ICD-10-CM

## 2013-10-02 DIAGNOSIS — F411 Generalized anxiety disorder: Secondary | ICD-10-CM | POA: Diagnosis present

## 2013-10-02 DIAGNOSIS — R739 Hyperglycemia, unspecified: Secondary | ICD-10-CM

## 2013-10-02 DIAGNOSIS — I5031 Acute diastolic (congestive) heart failure: Secondary | ICD-10-CM

## 2013-10-02 DIAGNOSIS — J189 Pneumonia, unspecified organism: Secondary | ICD-10-CM

## 2013-10-02 DIAGNOSIS — Z794 Long term (current) use of insulin: Secondary | ICD-10-CM

## 2013-10-02 DIAGNOSIS — F05 Delirium due to known physiological condition: Secondary | ICD-10-CM | POA: Diagnosis not present

## 2013-10-02 DIAGNOSIS — Z9181 History of falling: Secondary | ICD-10-CM

## 2013-10-02 DIAGNOSIS — N289 Disorder of kidney and ureter, unspecified: Secondary | ICD-10-CM

## 2013-10-02 DIAGNOSIS — E875 Hyperkalemia: Secondary | ICD-10-CM | POA: Diagnosis present

## 2013-10-02 DIAGNOSIS — J9601 Acute respiratory failure with hypoxia: Secondary | ICD-10-CM | POA: Diagnosis present

## 2013-10-02 DIAGNOSIS — E1169 Type 2 diabetes mellitus with other specified complication: Secondary | ICD-10-CM

## 2013-10-02 DIAGNOSIS — R740 Nonspecific elevation of levels of transaminase and lactic acid dehydrogenase [LDH]: Secondary | ICD-10-CM

## 2013-10-02 DIAGNOSIS — R7401 Elevation of levels of liver transaminase levels: Secondary | ICD-10-CM

## 2013-10-02 DIAGNOSIS — Z6841 Body Mass Index (BMI) 40.0 and over, adult: Secondary | ICD-10-CM

## 2013-10-02 DIAGNOSIS — Z8249 Family history of ischemic heart disease and other diseases of the circulatory system: Secondary | ICD-10-CM

## 2013-10-02 DIAGNOSIS — B379 Candidiasis, unspecified: Secondary | ICD-10-CM | POA: Diagnosis present

## 2013-10-02 DIAGNOSIS — B029 Zoster without complications: Secondary | ICD-10-CM

## 2013-10-02 DIAGNOSIS — R001 Bradycardia, unspecified: Secondary | ICD-10-CM

## 2013-10-02 DIAGNOSIS — N179 Acute kidney failure, unspecified: Secondary | ICD-10-CM

## 2013-10-02 DIAGNOSIS — Y92009 Unspecified place in unspecified non-institutional (private) residence as the place of occurrence of the external cause: Secondary | ICD-10-CM

## 2013-10-02 DIAGNOSIS — I509 Heart failure, unspecified: Secondary | ICD-10-CM | POA: Diagnosis present

## 2013-10-02 DIAGNOSIS — I639 Cerebral infarction, unspecified: Secondary | ICD-10-CM

## 2013-10-02 DIAGNOSIS — I129 Hypertensive chronic kidney disease with stage 1 through stage 4 chronic kidney disease, or unspecified chronic kidney disease: Secondary | ICD-10-CM | POA: Diagnosis present

## 2013-10-02 DIAGNOSIS — D649 Anemia, unspecified: Secondary | ICD-10-CM | POA: Diagnosis present

## 2013-10-02 DIAGNOSIS — R058 Other specified cough: Secondary | ICD-10-CM

## 2013-10-02 DIAGNOSIS — E8729 Other acidosis: Secondary | ICD-10-CM

## 2013-10-02 DIAGNOSIS — R1311 Dysphagia, oral phase: Secondary | ICD-10-CM | POA: Diagnosis present

## 2013-10-02 DIAGNOSIS — W19XXXA Unspecified fall, initial encounter: Secondary | ICD-10-CM | POA: Diagnosis present

## 2013-10-02 DIAGNOSIS — N189 Chronic kidney disease, unspecified: Secondary | ICD-10-CM

## 2013-10-02 DIAGNOSIS — I2510 Atherosclerotic heart disease of native coronary artery without angina pectoris: Secondary | ICD-10-CM | POA: Diagnosis present

## 2013-10-02 DIAGNOSIS — G4730 Sleep apnea, unspecified: Secondary | ICD-10-CM | POA: Diagnosis present

## 2013-10-02 DIAGNOSIS — J9600 Acute respiratory failure, unspecified whether with hypoxia or hypercapnia: Secondary | ICD-10-CM | POA: Diagnosis present

## 2013-10-02 HISTORY — DX: Heart failure, unspecified: I50.9

## 2013-10-02 LAB — URINALYSIS, ROUTINE W REFLEX MICROSCOPIC
BILIRUBIN URINE: NEGATIVE
Glucose, UA: >1000 mg/dL — AB
Hgb urine dipstick: NEGATIVE
Ketones, ur: NEGATIVE mg/dL
LEUKOCYTES UA: NEGATIVE
Nitrite: NEGATIVE
PH: 5.5 (ref 5.0–8.0)
Protein, ur: 100 mg/dL — AB
SPECIFIC GRAVITY, URINE: 1.022 (ref 1.005–1.030)
UROBILINOGEN UA: 0.2 mg/dL (ref 0.0–1.0)

## 2013-10-02 LAB — BASIC METABOLIC PANEL
BUN: 72 mg/dL — ABNORMAL HIGH (ref 6–23)
CHLORIDE: 103 meq/L (ref 96–112)
CO2: 19 meq/L (ref 19–32)
CREATININE: 2.23 mg/dL — AB (ref 0.50–1.10)
Calcium: 8.8 mg/dL (ref 8.4–10.5)
GFR calc Af Amer: 24 mL/min — ABNORMAL LOW (ref >90)
GFR calc non Af Amer: 21 mL/min — ABNORMAL LOW (ref >90)
Glucose, Bld: 505 mg/dL — ABNORMAL HIGH (ref 70–99)
POTASSIUM: 4.8 meq/L (ref 3.7–5.3)
Sodium: 139 mEq/L (ref 137–147)

## 2013-10-02 LAB — CBC WITH DIFFERENTIAL/PLATELET
Basophils Absolute: 0 10*3/uL (ref 0.0–0.1)
Basophils Relative: 0 % (ref 0–1)
EOS ABS: 0.4 10*3/uL (ref 0.0–0.7)
Eosinophils Relative: 4 % (ref 0–5)
HCT: 34.6 % — ABNORMAL LOW (ref 36.0–46.0)
Hemoglobin: 10.9 g/dL — ABNORMAL LOW (ref 12.0–15.0)
LYMPHS PCT: 15 % (ref 12–46)
Lymphs Abs: 1.3 10*3/uL (ref 0.7–4.0)
MCH: 29.6 pg (ref 26.0–34.0)
MCHC: 31.5 g/dL (ref 30.0–36.0)
MCV: 94 fL (ref 78.0–100.0)
Monocytes Absolute: 0.4 10*3/uL (ref 0.1–1.0)
Monocytes Relative: 4 % (ref 3–12)
NEUTROS PCT: 76 % (ref 43–77)
Neutro Abs: 6.8 10*3/uL (ref 1.7–7.7)
PLATELETS: 105 10*3/uL — AB (ref 150–400)
RBC: 3.68 MIL/uL — AB (ref 3.87–5.11)
RDW: 17.3 % — ABNORMAL HIGH (ref 11.5–15.5)
WBC: 8.9 10*3/uL (ref 4.0–10.5)

## 2013-10-02 LAB — COMPREHENSIVE METABOLIC PANEL
ALK PHOS: 98 U/L (ref 39–117)
ALT: 29 U/L (ref 0–35)
AST: 22 U/L (ref 0–37)
Albumin: 3.1 g/dL — ABNORMAL LOW (ref 3.5–5.2)
BUN: 74 mg/dL — ABNORMAL HIGH (ref 6–23)
CO2: 20 mEq/L (ref 19–32)
Calcium: 8.9 mg/dL (ref 8.4–10.5)
Chloride: 105 mEq/L (ref 96–112)
Creatinine, Ser: 2.33 mg/dL — ABNORMAL HIGH (ref 0.50–1.10)
GFR calc non Af Amer: 20 mL/min — ABNORMAL LOW (ref >90)
GFR, EST AFRICAN AMERICAN: 23 mL/min — AB (ref >90)
GLUCOSE: 413 mg/dL — AB (ref 70–99)
Potassium: 5.9 mEq/L — ABNORMAL HIGH (ref 3.7–5.3)
SODIUM: 138 meq/L (ref 137–147)
TOTAL PROTEIN: 7.4 g/dL (ref 6.0–8.3)
Total Bilirubin: 0.4 mg/dL (ref 0.3–1.2)

## 2013-10-02 LAB — TROPONIN I
Troponin I: <0.3 ng/mL (ref <0.30)
Troponin I: <0.3 ng/mL (ref <0.30)

## 2013-10-02 LAB — URINE MICROSCOPIC-ADD ON

## 2013-10-02 LAB — POTASSIUM: Potassium: 5.8 mEq/L — ABNORMAL HIGH (ref 3.7–5.3)

## 2013-10-02 LAB — I-STAT VENOUS BLOOD GAS, ED
Acid-base deficit: 6 mmol/L — ABNORMAL HIGH (ref 0.0–2.0)
Bicarbonate: 21.7 mEq/L (ref 20.0–24.0)
O2 Saturation: 36 %
PCO2 VEN: 52.3 mmHg — AB (ref 45.0–50.0)
PH VEN: 7.226 — AB (ref 7.250–7.300)
TCO2: 23 mmol/L (ref 0–100)
pO2, Ven: 26 mmHg — CL (ref 30.0–45.0)

## 2013-10-02 LAB — GLUCOSE, CAPILLARY
GLUCOSE-CAPILLARY: 502 mg/dL — AB (ref 70–99)
GLUCOSE-CAPILLARY: 471 mg/dL — AB (ref 70–99)
GLUCOSE-CAPILLARY: 551 mg/dL — AB (ref 70–99)
GLUCOSE-CAPILLARY: 407 mg/dL — AB (ref 70–99)
Glucose-Capillary: 386 mg/dL — ABNORMAL HIGH (ref 70–99)

## 2013-10-02 LAB — TSH: TSH: 0.97 u[IU]/mL (ref 0.350–4.500)

## 2013-10-02 LAB — MRSA PCR SCREENING: MRSA BY PCR: NEGATIVE

## 2013-10-02 LAB — CBG MONITORING, ED: Glucose-Capillary: 516 mg/dL — ABNORMAL HIGH (ref 70–99)

## 2013-10-02 LAB — I-STAT CG4 LACTIC ACID, ED: LACTIC ACID, VENOUS: 0.95 mmol/L (ref 0.5–2.2)

## 2013-10-02 LAB — PROTIME-INR
INR: 1.33 (ref 0.00–1.49)
Prothrombin Time: 16.2 seconds — ABNORMAL HIGH (ref 11.6–15.2)

## 2013-10-02 LAB — PRO B NATRIURETIC PEPTIDE: PRO B NATRI PEPTIDE: 6285 pg/mL — AB (ref 0–125)

## 2013-10-02 MED ORDER — FUROSEMIDE 10 MG/ML IJ SOLN
40.0000 mg | Freq: Two times a day (BID) | INTRAMUSCULAR | Status: DC
  Administered 2013-10-02 – 2013-10-03 (×2): 40 mg via INTRAVENOUS
  Filled 2013-10-02 (×4): qty 4

## 2013-10-02 MED ORDER — CYANOCOBALAMIN 500 MCG SL SUBL: 1.0000 | SUBLINGUAL_TABLET | Freq: Every day | SUBLINGUAL | Status: DC

## 2013-10-02 MED ORDER — PREGABALIN 75 MG PO CAPS
150.0000 mg | ORAL_CAPSULE | Freq: Two times a day (BID) | ORAL | Status: DC
Start: 2013-10-02 — End: 2013-10-02

## 2013-10-02 MED ORDER — SENNOSIDES-DOCUSATE SODIUM 8.6-50 MG PO TABS
1.0000 | ORAL_TABLET | Freq: Every evening | ORAL | Status: DC | PRN
  Filled 2013-10-02: qty 1

## 2013-10-02 MED ORDER — ASPIRIN 81 MG PO CHEW
324.0000 mg | CHEWABLE_TABLET | Freq: Once | ORAL | Status: AC
  Administered 2013-10-02: 324 mg via ORAL
  Filled 2013-10-02: qty 4

## 2013-10-02 MED ORDER — DEXTROSE 5 % IV SOLN
5.0000 mg/h | INTRAVENOUS | Status: DC
  Administered 2013-10-02: 10 mg/h via INTRAVENOUS
  Administered 2013-10-02: 5 mg/h via INTRAVENOUS

## 2013-10-02 MED ORDER — CYANOCOBALAMIN 500 MCG PO TABS
500.0000 ug | ORAL_TABLET | Freq: Every day | ORAL | Status: DC
  Administered 2013-10-02 – 2013-10-07 (×6): 500 ug via ORAL
  Filled 2013-10-02 (×6): qty 1

## 2013-10-02 MED ORDER — INSULIN REGULAR BOLUS VIA INFUSION
0.0000 [IU] | Freq: Three times a day (TID) | INTRAVENOUS | Status: DC
  Filled 2013-10-02: qty 10

## 2013-10-02 MED ORDER — SODIUM POLYSTYRENE SULFONATE 15 GM/60ML PO SUSP
30.0000 g | Freq: Once | ORAL | Status: AC
  Administered 2013-10-02: 30 g via ORAL
  Filled 2013-10-02: qty 120

## 2013-10-02 MED ORDER — POTASSIUM CHLORIDE CRYS ER 20 MEQ PO TBCR: 20.0000 meq | EXTENDED_RELEASE_TABLET | Freq: Every day | ORAL | Status: DC

## 2013-10-02 MED ORDER — CARVEDILOL 6.25 MG PO TABS
6.2500 mg | ORAL_TABLET | Freq: Two times a day (BID) | ORAL | Status: DC
  Administered 2013-10-03 – 2013-10-07 (×9): 6.25 mg via ORAL
  Filled 2013-10-02 (×12): qty 1

## 2013-10-02 MED ORDER — INSULIN ASPART 100 UNIT/ML ~~LOC~~ SOLN
8.0000 [IU] | Freq: Once | SUBCUTANEOUS | Status: AC
  Administered 2013-10-02: 8 [IU] via SUBCUTANEOUS
  Filled 2013-10-02: qty 1

## 2013-10-02 MED ORDER — FUROSEMIDE 10 MG/ML IJ SOLN
40.0000 mg | Freq: Once | INTRAMUSCULAR | Status: AC
  Administered 2013-10-02: 40 mg via INTRAVENOUS
  Filled 2013-10-02: qty 4

## 2013-10-02 MED ORDER — DEXTROSE 50 % IV SOLN
25.0000 mL | INTRAVENOUS | Status: DC | PRN
Start: 2013-10-02 — End: 2013-10-03

## 2013-10-02 MED ORDER — NITROGLYCERIN 0.4 MG SL SUBL: 0.4000 mg | SUBLINGUAL_TABLET | SUBLINGUAL | Status: DC | PRN

## 2013-10-02 MED ORDER — LEVALBUTEROL HCL 0.63 MG/3ML IN NEBU
0.6300 mg | INHALATION_SOLUTION | RESPIRATORY_TRACT | Status: DC | PRN
  Administered 2013-10-03 – 2013-10-04 (×5): 0.63 mg via RESPIRATORY_TRACT
  Filled 2013-10-02 (×5): qty 3

## 2013-10-02 MED ORDER — DILTIAZEM HCL 25 MG/5ML IV SOLN
10.0000 mg | Freq: Once | INTRAVENOUS | Status: AC
  Administered 2013-10-02: 10 mg via INTRAVENOUS
  Filled 2013-10-02: qty 5

## 2013-10-02 MED ORDER — FERROUS SULFATE 325 (65 FE) MG PO TABS
325.0000 mg | ORAL_TABLET | Freq: Two times a day (BID) | ORAL | Status: DC
  Administered 2013-10-02 – 2013-10-07 (×10): 325 mg via ORAL
  Filled 2013-10-02 (×12): qty 1

## 2013-10-02 MED ORDER — IPRATROPIUM-ALBUTEROL 0.5-2.5 (3) MG/3ML IN SOLN
3.0000 mL | RESPIRATORY_TRACT | Status: DC
  Administered 2013-10-02 (×2): 3 mL via RESPIRATORY_TRACT
  Filled 2013-10-02 (×2): qty 3

## 2013-10-02 MED ORDER — SODIUM CHLORIDE 0.9 % IV SOLN: INTRAVENOUS | Status: DC

## 2013-10-02 MED ORDER — ACETAMINOPHEN 325 MG PO TABS
650.0000 mg | ORAL_TABLET | Freq: Four times a day (QID) | ORAL | Status: DC | PRN
  Administered 2013-10-03 – 2013-10-07 (×5): 650 mg via ORAL
  Filled 2013-10-02 (×5): qty 2

## 2013-10-02 MED ORDER — INSULIN REGULAR HUMAN 100 UNIT/ML IJ SOLN
INTRAMUSCULAR | Status: DC
  Administered 2013-10-02: 4.1 [IU]/h via INTRAVENOUS
  Filled 2013-10-02: qty 1

## 2013-10-02 MED ORDER — NYSTATIN 100000 UNIT/GM EX POWD
Freq: Three times a day (TID) | CUTANEOUS | Status: DC
  Administered 2013-10-02 – 2013-10-07 (×14): via TOPICAL
  Filled 2013-10-02 (×2): qty 15

## 2013-10-02 MED ORDER — PREGABALIN 25 MG PO CAPS
150.0000 mg | ORAL_CAPSULE | Freq: Two times a day (BID) | ORAL | Status: DC
  Administered 2013-10-02 – 2013-10-05 (×5): 150 mg via ORAL
  Filled 2013-10-02: qty 1
  Filled 2013-10-02 (×2): qty 2
  Filled 2013-10-02: qty 1
  Filled 2013-10-02: qty 2
  Filled 2013-10-02: qty 1
  Filled 2013-10-02 (×2): qty 2
  Filled 2013-10-02: qty 1
  Filled 2013-10-02: qty 2

## 2013-10-02 MED ORDER — ONDANSETRON HCL 4 MG/2ML IJ SOLN: 4.0000 mg | Freq: Four times a day (QID) | INTRAMUSCULAR | Status: DC | PRN

## 2013-10-02 MED ORDER — ISOSORBIDE MONONITRATE ER 30 MG PO TB24
30.0000 mg | ORAL_TABLET | Freq: Every day | ORAL | Status: DC
  Administered 2013-10-02 – 2013-10-07 (×6): 30 mg via ORAL
  Filled 2013-10-02 (×6): qty 1

## 2013-10-02 MED ORDER — VITAMIN D (ERGOCALCIFEROL) 1.25 MG (50000 UNIT) PO CAPS
50000.0000 [IU] | ORAL_CAPSULE | ORAL | Status: DC
  Administered 2013-10-02: 50000 [IU] via ORAL
  Filled 2013-10-02: qty 1

## 2013-10-02 MED ORDER — APIXABAN 5 MG PO TABS
5.0000 mg | ORAL_TABLET | Freq: Two times a day (BID) | ORAL | Status: DC
  Administered 2013-10-02 – 2013-10-05 (×6): 5 mg via ORAL
  Filled 2013-10-02 (×8): qty 1

## 2013-10-02 MED ORDER — RACEPINEPHRINE HCL 2.25 % IN NEBU
0.5000 mL | INHALATION_SOLUTION | Freq: Once | RESPIRATORY_TRACT | Status: AC
Start: 2013-10-02 — End: 2013-10-02
  Administered 2013-10-02: 0.5 mL via RESPIRATORY_TRACT
  Filled 2013-10-02: qty 0.5

## 2013-10-02 MED ORDER — IPRATROPIUM-ALBUTEROL 0.5-2.5 (3) MG/3ML IN SOLN
3.0000 mL | Freq: Once | RESPIRATORY_TRACT | Status: AC
  Administered 2013-10-02: 3 mL via RESPIRATORY_TRACT
  Filled 2013-10-02: qty 3

## 2013-10-02 MED ORDER — ONDANSETRON HCL 4 MG PO TABS: 4.0000 mg | ORAL_TABLET | Freq: Four times a day (QID) | ORAL | Status: DC | PRN

## 2013-10-02 MED ORDER — ACETAMINOPHEN 650 MG RE SUPP: 650.0000 mg | Freq: Four times a day (QID) | RECTAL | Status: DC | PRN

## 2013-10-02 NOTE — ED Notes (Signed)
Pt states she has been feeling short of breath for the past 2 days.  Can not explain why she is on O2 at home.  Pt states she fell yesterday when she was in her bathroom.  Pt states she fell in to the door, cannot remember is she had LOC.  Pt has scattered bruising on the Left and right shoulders and bilateral arms.  Pt presents with a left wrist brace unrelated to yesterdays fall.

## 2013-10-02 NOTE — H&P (Signed)
[facility] History and Physical  [name] GGY:[id number] DOB: January 03, 1941 DOA: 10/02/2013  Referring physician: EDP PCP: Sherrie Mustache, MD   Chief Complaint:   HPI: [name] is a 73 y.o. female with past medical history significant for chronic diastolic CH, uncontrolled diabetes mellitus, hypertension, sleep apnea, hypertension, CAD history of MI in the past, anemia and other medical problems as listed below who presents with complaints of worsening shortness of breath x1 week. She also reports chest pain over the past one week described as a tightness, intermittent up to 10 out of 10 in intensity sometimes. Her daughter is at the bedside assisting with the history and states that she was living at home with her son. Patient admits to leg swelling over the past several weeks. She reports that she fell last PM as she was trying to get up from the commode. She denies any dizziness, nausea or vomiting and no diarrhea. She was seen in the ED and imaging studies were negative for any fractures. CT of head showed no acute findings. BNP was elevated at 6285, chest x-ray showed improvement in central venous pulmonary congestion. She was treated with IV Lasix, steroids and nebs. Initial EKG showed atrial fibrillation at 94, but followup tele monitoring she was found to be in atrial fibrillation with rapid ventricular rate response and started on Cardizem drip. Patient's blood glucose was greater than 400, potassium 5.9, CO2 of 20. She was given insulin, Kayexalate and admitted for further evaluation and management. (Pt might not have been compliant with her Eliquis as it is not listed on her preadmission meds).    Review of Systems The patient denies anorexia, fever, weight loss,, vision loss, decreased hearing, hoarseness,edema, balance deficits, hemoptysis, , melena, hematochezia, severe indigestion/heartburn, hematuria, incontinence, suspicious skin lesions, transient blindness,  depression, unusual weight change, abnormal bleeding, enlarged lymph nodes, angioedema, and breast masses.   Past Medical History  Diagnosis Date  . Diabetes mellitus   . Hypertension   . Hyperlipemia   . Fibromyalgia   . Obesity   . Sleep apnea     No CPAP  . Anxiety   . Hiatal hernia   . NSTEMI (non-ST elevated myocardial infarction) 04/27/2011  . Anemia 04/27/2011  . Restless leg syndrome   . CAD (coronary artery disease)     small dominant right coronary artery with diffuse 95% stenosis. The LAD had 70% stenosis in the mid vessel beyond the diagonal branch but was a small vessel. Circumflex had proximal 30% stenosis.   Marland Kitchen External hemorrhoids   . Arrhythmia 10/11/2011  . Community acquired pneumonia 06/24/2012  . Renal insufficiency 05/06/2011  . Shingles   . CHF exacerbation 10/02/2013   Past Surgical History  Procedure Laterality Date  . Appendectomy    . Fracture surgery      left humerous  . Ganglion cyst removal     Social History:  reports that she has never smoked. She has never used smokeless tobacco. She reports that she does not drink alcohol or use illicit drugs.  Allergies  Allergen Reactions  . Codeine Other (See Comments)    Childhood allergy    Family History  Problem Relation Age of Onset  . Stomach cancer Mother   . Heart disease Maternal Grandmother   . Heart disease Maternal Grandfather   . Dementia Father      Prior to Admission medications   Medication Sig Start Date End Date Taking? Authorizing Provider  carvedilol (COREG) 6.25 MG tablet Take  6.25 mg by mouth 2 (two) times daily with a meal.   Yes Historical Provider, MD  Cyanocobalamin 500 MCG SUBL Place 1 tablet under the tongue daily.   Yes Historical Provider, MD  ferrous sulfate 325 (65 FE) MG tablet Take 325 mg by mouth 2 (two) times daily.   Yes Historical Provider, MD  insulin aspart (NOVOLOG) 100 UNIT/ML injection Inject 0-9 Units into the skin 3 (three) times daily with meals.  07/03/13  Yes Robbie Lis, MD  Insulin Glargine (LANTUS SOLOSTAR) 100 UNIT/ML Solostar Pen Inject 40 Units into the skin at bedtime.   Yes Historical Provider, MD  insulin lispro (HUMALOG KWIKPEN) 100 UNIT/ML KiwkPen Inject 10 Units into the skin 3 (three) times daily with meals.   Yes Historical Provider, MD  isosorbide mononitrate (IMDUR) 30 MG 24 hr tablet Take 30 mg by mouth daily.   Yes Historical Provider, MD  nitroGLYCERIN (NITROSTAT) 0.4 MG SL tablet Place 1 mg under the tongue every 5 (five) minutes x 3 doses as needed for chest pain. For chest pain   Yes Historical Provider, MD  potassium chloride SA (K-DUR,KLOR-CON) 20 MEQ tablet Take 20 mEq by mouth daily.   Yes Historical Provider, MD  pregabalin (LYRICA) 150 MG capsule Take 150 mg by mouth 2 (two) times daily.   Yes Historical Provider, MD  senna-docusate (SENOKOT-S) 8.6-50 MG per tablet Take 1 tablet by mouth at bedtime as needed for mild constipation. 07/03/13  Yes Robbie Lis, MD  torsemide (DEMADEX) 20 MG tablet Take 2 tablets (40 mg total) by mouth daily. 11/05/12  Yes Radene Gunning, NP  Vitamin D, Ergocalciferol, (DRISDOL) 50000 UNITS CAPS capsule Take 50,000 Units by mouth every Thursday.   Yes Historical Provider, MD   Physical Exam: Filed Vitals:   10/02/13 1645  BP: 117/72  Pulse:   Temp:   Resp: 18    BP 117/72  Pulse 90  Temp(Src) 98.2 F (36.8 C) (Oral)  Resp 18  SpO2 97% Constitutional: Vital signs reviewed.  Patient is a well-developed and morbidly obese in no acute distress and cooperative with exam. Alert and oriented x3.  Head: Normocephalic and atraumatic Mouth: no erythema or exudates, MMM Eyes: PERRL, EOMI, conjunctivae normal, No scleral icterus.  Neck: Supple, Trachea midline normal ROM, No JVD, mass, thyromegaly, or carotid bruit present.  Cardiovascular: Tachycardic, irregular, S1 normal, S2 normal, no MRG, pulses symmetric and intact bilaterally Pulmonary/Chest: Coarse breath sounds, few basilar  crackles. Abdominal: Soft. Non-tender, non-distended, bowel sounds are normal, no masses, organomegaly, or guarding present. Erythema in intertriginous areas/groins bilaterally  GU: no CVA tenderness Extremities: Trace to +1 edema, no cyanosis  Neurological: A&O x3, cranial nerve II-XII are grossly intact, no focal motor deficit.  Skin: Warm, dry and intact. No rash, cyanosis, or clubbing.  Psychiatric: Normal mood and affect. speech and behavior is normal.              Labs on Admission:  Basic Metabolic Panel:  Recent Labs Lab 10/02/13 0905 10/02/13 1149  NA 138  --   K 5.9* 5.8*  CL 105  --   CO2 20  --   GLUCOSE 413*  --   BUN 74*  --   CREATININE 2.33*  --   CALCIUM 8.9  --    Liver Function Tests:  Recent Labs Lab 10/02/13 0905  AST 22  ALT 29  ALKPHOS 98  BILITOT 0.4  PROT 7.4  ALBUMIN 3.1*   No results found for  this basename: LIPASE, AMYLASE,  in the last 168 hours No results found for this basename: AMMONIA,  in the last 168 hours CBC:  Recent Labs Lab 10/02/13 0905  WBC 8.9  NEUTROABS 6.8  HGB 10.9*  HCT 34.6*  MCV 94.0  PLT 105*   Cardiac Enzymes:  Recent Labs Lab 10/02/13 0905  TROPONINI <0.30    BNP (last 3 results)  Recent Labs  07/01/13 0552 07/03/13 0457 10/02/13 0905  PROBNP 4028.0* 3715.0* 6285.0*   CBG:  Recent Labs Lab 10/02/13 1507 10/02/13 1728  GLUCAP 516* 502*    Radiological Exams on Admission: Dg Chest 1 View  10/02/2013   CLINICAL DATA:  Right shoulder bruising fall, right shoulder bruising  EXAM: CHEST - 1 VIEW  COMPARISON:  Chest radiograph 08/20/2013  FINDINGS: Stable enlarged cardiac silhouette. There is improvement in central venous pulmonary congestion pattern seen on prior. Bibasilar atelectasis appear  IMPRESSION: 1. Improvement in central venous pulmonary congestion. 2. Persistent cardiomegaly. 3. Bibasilar atelectasis.   Electronically Signed   By: Suzy Bouchard M.D.   On: 10/02/2013 10:22    Dg Shoulder Right  10/02/2013   CLINICAL DATA:  Right shoulder pain  EXAM: RIGHT SHOULDER - 2+ VIEW  COMPARISON:  None.  FINDINGS: There is no fracture or dislocation. There are mild degenerative changes of the acromioclavicular joint. Mild interstitial thickening involving the right lung.  IMPRESSION: No acute osseous injury of the right shoulder.   Electronically Signed   By: Kathreen Devoid   On: 10/02/2013 10:05   Ct Head Wo Contrast  10/02/2013   CLINICAL DATA:  No cervical spine fracture or traumatic subluxation. Similar appearance to priors.  EXAM: CT HEAD WITHOUT CONTRAST  CT CERVICAL SPINE WITHOUT CONTRAST  TECHNIQUE: Multidetector CT imaging of the head and cervical spine was performed following the standard protocol without intravenous contrast. Multiplanar CT image reconstructions of the cervical spine were also generated.  COMPARISON:  08/20/2013.  FINDINGS: CT HEAD FINDINGS  No evidence for acute infarction, hemorrhage, mass lesion, hydrocephalus, or extra-axial fluid. Mild atrophy. Chronic microvascular ischemic change. Remote left posterior temporal/occipital infarct. Vascular calcification. Calvarium intact. No sinus or mastoid disease.  CT CERVICAL SPINE FINDINGS  There is no visible cervical spine fracture, traumatic subluxation, prevertebral soft tissue swelling, or intraspinal hematoma. Carotid atherosclerosis. Facet arthropathy. Clear lung apices. No dominant neck mass.  IMPRESSION: Atrophy and small vessel disease similar to priors. Remote cerebral infarction. No skull fracture or intracranial hemorrhage.   Electronically Signed   By: Rolla Flatten M.D.   On: 10/02/2013 10:35   Ct Cervical Spine Wo Contrast  10/02/2013   CLINICAL DATA:  No cervical spine fracture or traumatic subluxation. Similar appearance to priors.  EXAM: CT HEAD WITHOUT CONTRAST  CT CERVICAL SPINE WITHOUT CONTRAST  TECHNIQUE: Multidetector CT imaging of the head and cervical spine was performed following the  standard protocol without intravenous contrast. Multiplanar CT image reconstructions of the cervical spine were also generated.  COMPARISON:  08/20/2013.  FINDINGS: CT HEAD FINDINGS  No evidence for acute infarction, hemorrhage, mass lesion, hydrocephalus, or extra-axial fluid. Mild atrophy. Chronic microvascular ischemic change. Remote left posterior temporal/occipital infarct. Vascular calcification. Calvarium intact. No sinus or mastoid disease.  CT CERVICAL SPINE FINDINGS  There is no visible cervical spine fracture, traumatic subluxation, prevertebral soft tissue swelling, or intraspinal hematoma. Carotid atherosclerosis. Facet arthropathy. Clear lung apices. No dominant neck mass.  IMPRESSION: Atrophy and small vessel disease similar to priors. Remote cerebral infarction. No skull fracture  or intracranial hemorrhage.   Electronically Signed   By: Rolla Flatten M.D.   On: 10/02/2013 10:35   Dg Shoulder Left  10/02/2013   CLINICAL DATA:  Left shoulder pain.  EXAM: LEFT SHOULDER - 2+ VIEW  COMPARISON:  None.  FINDINGS: There is no fracture or dislocation. There are mild degenerative changes of the acromioclavicular joint.  IMPRESSION: No acute osseous injury of the left shoulder.   Electronically Signed   By: Kathreen Devoid   On: 10/02/2013 10:06    EKG:   Assessment/Plan Active Problems:   Atrial fibrillation with RVR -As discussed above Continue Cardizem drip, follow resume Coreg in a.m. and by mouth meds as appropriate and wean drip -Cycle cardiac enzymes, will hold off echo for now as she had one done January 2015-EF 55-60% -Resume Eliquis 5 mg twice a day as previously recommended per cardiology   CHF, acute on chronic diastolic -Diuresis IV Lasix -Monitor daily weights, strict I.'s and Os -Cycle cardiac enzymes, last echo January 2015 with EF 55-60% as above -Follow consult cards pending enzymes/clinical course as appropriate   CAD (coronary artery disease) /Chest pain -Cycle cardiac  enzymes as above follow and continue outpatient medications   CKD (chronic kidney disease), stage IV -Creatinine stable, monitor closely with diuresis   Hyperkalemia -Given insulin, and Kayexalate in ED. As above also received bronchodilators -Recheck K. Follow and further treat accordingly. Will DC potassium supplements   DM type 2, uncontrolled, with neuropathy with hyperglycemia -Will place on IV insulin per glucose stabilizer>> follow and transition to Lantus and sliding scale when she meets criteria   Fall at home -Imaging studies negative for fractures -Check TSH -Follow consult PT OT when appropriate Intertriginous yeast infection -Mycostatin powder follow     Code Status: full Family Communication: daughter at beside Disposition Plan: admit to step down  Time spent:   [facility] Pager [phone]

## 2013-10-02 NOTE — ED Notes (Signed)
Breathing treatment ended.

## 2013-10-02 NOTE — ED Notes (Signed)
Lactic acid results given to Dr. Wyvonnia Dusky

## 2013-10-02 NOTE — ED Notes (Signed)
EMS - pt presents with c/o SOB x 2 days.  Pt took 2 puffs Albuterol from home and with EMS given 2 Abuterol treatments and 125 Solumedrol.  Pt on 4L Nasal Cannula, O2 saturation of 88%.

## 2013-10-02 NOTE — Progress Notes (Signed)
Dr. Dillard Essex notified of CBG 502. Stated she will write orders

## 2013-10-02 NOTE — ED Notes (Signed)
Delice Lesch (Sister) [phone]

## 2013-10-02 NOTE — ED Notes (Signed)
Phone received by this RN to redraw Potassium lab.  Tech at bedside.

## 2013-10-02 NOTE — ED Provider Notes (Signed)
CSN: [id number]     Arrival date & time 10/02/13  6948 History   First MD Initiated Contact with Patient 10/02/13 0825     Chief Complaint  Patient presents with  . Shortness of Breath     (Consider location/radiation/quality/duration/timing/severity/associated sxs/prior Treatment) HPI Comments: Patient presents with a two-day history of shortness of breath associated with chest tightness. She's been wearing oxygen at the nursing home all the time of which he normally wears intermittently. She endorses nonproductive cough. No fever. She is a poor historian. She reports falling and her right side injuring her shoulder and possibly her head. She is on eliquis for a history of atrial fibrillation. He is not sure why she is on oxygen. She denies COPD or asthma. She was initially hypoxic 87% on room air. She denies any abdominal pain, leg pain leg swelling. She denies any fever. She reports improvement receiving nebulizers and steroids via EMS.  The history is provided by the patient and the EMS personnel.    Past Medical History  Diagnosis Date  . Diabetes mellitus   . Hypertension   . Hyperlipemia   . Fibromyalgia   . Obesity   . Sleep apnea     No CPAP  . Anxiety   . Hiatal hernia   . NSTEMI (non-ST elevated myocardial infarction) 04/27/2011  . Anemia 04/27/2011  . Restless leg syndrome   . CAD (coronary artery disease)     small dominant right coronary artery with diffuse 95% stenosis. The LAD had 70% stenosis in the mid vessel beyond the diagonal branch but was a small vessel. Circumflex had proximal 30% stenosis.   Marland Kitchen External hemorrhoids   . Arrhythmia 10/11/2011  . Community acquired pneumonia 06/24/2012  . Renal insufficiency 05/06/2011  . Shingles   . CHF exacerbation 10/02/2013   Past Surgical History  Procedure Laterality Date  . Appendectomy    . Fracture surgery      left humerous  . Ganglion cyst removal     Family History  Problem Relation Age of Onset  . Stomach  cancer Mother   . Heart disease Maternal Grandmother   . Heart disease Maternal Grandfather   . Dementia Father    History  Substance Use Topics  . Smoking status: Never Smoker   . Smokeless tobacco: Never Used  . Alcohol Use: No   OB History   Grav Para Term Preterm Abortions TAB SAB Ect Mult Living                 Review of Systems  Constitutional: Positive for activity change and appetite change. Negative for fatigue.  Respiratory: Positive for cough, chest tightness and shortness of breath.   Cardiovascular: Positive for chest pain.  Gastrointestinal: Negative for nausea, vomiting and abdominal pain.  Genitourinary: Negative for dysuria, hematuria and vaginal discharge.  Musculoskeletal: Negative for arthralgias and myalgias.  Neurological: Negative for dizziness and headaches.  A complete 10 system review of systems was obtained and all systems are negative except as noted in the HPI and PMH.      Allergies  Codeine  Home Medications   Prior to Admission medications   Medication Sig Start Date End Date Taking? Authorizing Provider  acetaminophen (TYLENOL) 325 MG tablet Take 2 tablets (650 mg total) by mouth every 6 (six) hours as needed for mild pain. 07/03/13   Robbie Lis, MD  apixaban (ELIQUIS) 5 MG TABS tablet Take 1 tablet (5 mg total) by mouth 2 (two) times daily.  07/03/13   Robbie Lis, MD  atorvastatin (LIPITOR) 80 MG tablet Take 80 mg by mouth at bedtime.    Historical Provider, MD  carvedilol (COREG) 12.5 MG tablet Take 1 tablet (12.5 mg total) by mouth 2 (two) times daily with a meal. 07/03/13   Robbie Lis, MD  Cyanocobalamin 500 MCG SUBL Place 1 tablet under the tongue daily.    Historical Provider, MD  diltiazem (CARDIZEM CD) 120 MG 24 hr capsule Take 1 capsule (120 mg total) by mouth daily. 07/03/13   Robbie Lis, MD  ferrous sulfate 325 (65 FE) MG tablet Take 325 mg by mouth 2 (two) times daily.    Historical Provider, MD  fluocinonide cream (LIDEX)  1.75 % Apply 1 application topically 2 (two) times daily.    Historical Provider, MD  insulin aspart (NOVOLOG) 100 UNIT/ML injection Inject 0-9 Units into the skin 3 (three) times daily with meals. 07/03/13   Robbie Lis, MD  Insulin Glargine (LANTUS SOLOSTAR) 100 UNIT/ML Solostar Pen Inject 40 Units into the skin at bedtime.    Historical Provider, MD  insulin lispro (HUMALOG KWIKPEN) 100 UNIT/ML KiwkPen Inject 10 Units into the skin 3 (three) times daily with meals.    Historical Provider, MD  isosorbide mononitrate (IMDUR) 30 MG 24 hr tablet Take 30 mg by mouth daily.    Historical Provider, MD  nitroGLYCERIN (NITROSTAT) 0.4 MG SL tablet Place 1 mg under the tongue every 5 (five) minutes x 3 doses as needed for chest pain. For chest pain    Historical Provider, MD  nystatin (MYCOSTATIN/NYSTOP) 100000 UNIT/GM POWD Apply 1 Bottle topically 2 (two) times daily. 07/03/13   Robbie Lis, MD  pregabalin (LYRICA) 150 MG capsule Take 150 mg by mouth 2 (two) times daily.    Historical Provider, MD  senna-docusate (SENOKOT-S) 8.6-50 MG per tablet Take 1 tablet by mouth at bedtime as needed for mild constipation. 07/03/13   Robbie Lis, MD  torsemide (DEMADEX) 20 MG tablet Take 2 tablets (40 mg total) by mouth daily. 11/05/12   Radene Gunning, NP  Vitamin D, Ergocalciferol, (DRISDOL) 50000 UNITS CAPS capsule Take 50,000 Units by mouth every Thursday.    Historical Provider, MD   BP 172/83  Pulse 115  Temp(Src) 97.6 F (36.4 C) (Axillary)  Resp 19  SpO2 98% Physical Exam  Constitutional: She is oriented to person, place, and time. She appears well-developed and well-nourished. She appears distressed.  Mildly increased work of breathing speaking in short sentences  HENT:  Head: Normocephalic and atraumatic.  Mouth/Throat: Oropharynx is clear and moist. No oropharyngeal exudate.  Eyes: Conjunctivae and EOM are normal. Pupils are equal, round, and reactive to light.  Neck: Normal range of motion. Neck  supple.  No C-spine tenderness  Cardiovascular: Normal rate, regular rhythm and normal heart sounds.   No murmur heard. Pulmonary/Chest: She is in respiratory distress. She has wheezes.  Diminished breath sounds bilaterally with scattered respiratory wheezing and poor air exchange  Abdominal: Soft. There is no tenderness. There is no rebound and no guarding.  Musculoskeletal: Normal range of motion. She exhibits no edema and no tenderness.  Ecchymosis to right supraclavicular area. Ecchymosis and bruising to her left shoulder without bony tenderness No T. or L-spine tenderness  Neurological: She is alert and oriented to person, place, and time. No cranial nerve deficit. She exhibits normal muscle tone. Coordination normal.    ED Course  Procedures (including critical care time) Labs Review Labs  Reviewed  CBC WITH DIFFERENTIAL - Abnormal; Notable for the following:    RBC 3.68 (*)    Hemoglobin 10.9 (*)    HCT 34.6 (*)    RDW 17.3 (*)    Platelets 105 (*)    All other components within normal limits  COMPREHENSIVE METABOLIC PANEL - Abnormal; Notable for the following:    Potassium 5.9 (*)    Glucose, Bld 413 (*)    BUN 74 (*)    Creatinine, Ser 2.33 (*)    Albumin 3.1 (*)    GFR calc non Af Amer 20 (*)    GFR calc Af Amer 23 (*)    All other components within normal limits  PROTIME-INR - Abnormal; Notable for the following:    Prothrombin Time 16.2 (*)    All other components within normal limits  PRO B NATRIURETIC PEPTIDE - Abnormal; Notable for the following:    Pro B Natriuretic peptide (BNP) 6285.0 (*)    All other components within normal limits  URINALYSIS, ROUTINE W REFLEX MICROSCOPIC - Abnormal; Notable for the following:    Glucose, UA >1000 (*)    Protein, ur 100 (*)    All other components within normal limits  URINE MICROSCOPIC-ADD ON - Abnormal; Notable for the following:    Casts HYALINE CASTS (*)    All other components within normal limits  POTASSIUM -  Abnormal; Notable for the following:    Potassium 5.8 (*)    All other components within normal limits  I-STAT VENOUS BLOOD GAS, ED - Abnormal; Notable for the following:    pH, Ven 7.226 (*)    pCO2, Ven 52.3 (*)    pO2, Ven 26.0 (*)    Acid-base deficit 6.0 (*)    All other components within normal limits  TROPONIN I  I-STAT CG4 LACTIC ACID, ED    Imaging Review Dg Chest 1 View  10/02/2013   CLINICAL DATA:  Right shoulder bruising fall, right shoulder bruising  EXAM: CHEST - 1 VIEW  COMPARISON:  Chest radiograph 08/20/2013  FINDINGS: Stable enlarged cardiac silhouette. There is improvement in central venous pulmonary congestion pattern seen on prior. Bibasilar atelectasis appear  IMPRESSION: 1. Improvement in central venous pulmonary congestion. 2. Persistent cardiomegaly. 3. Bibasilar atelectasis.   Electronically Signed   By: Suzy Bouchard M.D.   On: 10/02/2013 10:22   Dg Shoulder Right  10/02/2013   CLINICAL DATA:  Right shoulder pain  EXAM: RIGHT SHOULDER - 2+ VIEW  COMPARISON:  None.  FINDINGS: There is no fracture or dislocation. There are mild degenerative changes of the acromioclavicular joint. Mild interstitial thickening involving the right lung.  IMPRESSION: No acute osseous injury of the right shoulder.   Electronically Signed   By: Kathreen Devoid   On: 10/02/2013 10:05   Ct Head Wo Contrast  10/02/2013   CLINICAL DATA:  No cervical spine fracture or traumatic subluxation. Similar appearance to priors.  EXAM: CT HEAD WITHOUT CONTRAST  CT CERVICAL SPINE WITHOUT CONTRAST  TECHNIQUE: Multidetector CT imaging of the head and cervical spine was performed following the standard protocol without intravenous contrast. Multiplanar CT image reconstructions of the cervical spine were also generated.  COMPARISON:  08/20/2013.  FINDINGS: CT HEAD FINDINGS  No evidence for acute infarction, hemorrhage, mass lesion, hydrocephalus, or extra-axial fluid. Mild atrophy. Chronic microvascular ischemic  change. Remote left posterior temporal/occipital infarct. Vascular calcification. Calvarium intact. No sinus or mastoid disease.  CT CERVICAL SPINE FINDINGS  There is no visible cervical  spine fracture, traumatic subluxation, prevertebral soft tissue swelling, or intraspinal hematoma. Carotid atherosclerosis. Facet arthropathy. Clear lung apices. No dominant neck mass.  IMPRESSION: Atrophy and small vessel disease similar to priors. Remote cerebral infarction. No skull fracture or intracranial hemorrhage.   Electronically Signed   By: Rolla Flatten M.D.   On: 10/02/2013 10:35   Ct Cervical Spine Wo Contrast  10/02/2013   CLINICAL DATA:  No cervical spine fracture or traumatic subluxation. Similar appearance to priors.  EXAM: CT HEAD WITHOUT CONTRAST  CT CERVICAL SPINE WITHOUT CONTRAST  TECHNIQUE: Multidetector CT imaging of the head and cervical spine was performed following the standard protocol without intravenous contrast. Multiplanar CT image reconstructions of the cervical spine were also generated.  COMPARISON:  08/20/2013.  FINDINGS: CT HEAD FINDINGS  No evidence for acute infarction, hemorrhage, mass lesion, hydrocephalus, or extra-axial fluid. Mild atrophy. Chronic microvascular ischemic change. Remote left posterior temporal/occipital infarct. Vascular calcification. Calvarium intact. No sinus or mastoid disease.  CT CERVICAL SPINE FINDINGS  There is no visible cervical spine fracture, traumatic subluxation, prevertebral soft tissue swelling, or intraspinal hematoma. Carotid atherosclerosis. Facet arthropathy. Clear lung apices. No dominant neck mass.  IMPRESSION: Atrophy and small vessel disease similar to priors. Remote cerebral infarction. No skull fracture or intracranial hemorrhage.   Electronically Signed   By: Rolla Flatten M.D.   On: 10/02/2013 10:35   Dg Shoulder Left  10/02/2013   CLINICAL DATA:  Left shoulder pain.  EXAM: LEFT SHOULDER - 2+ VIEW  COMPARISON:  None.  FINDINGS: There is no  fracture or dislocation. There are mild degenerative changes of the acromioclavicular joint.  IMPRESSION: No acute osseous injury of the left shoulder.   Electronically Signed   By: Kathreen Devoid   On: 10/02/2013 10:06     EKG Interpretation   Date/Time:  Thursday October 02 2013 08:20:35 EDT Ventricular Rate:  94 PR Interval:    QRS Duration: 137 QT Interval:  375 QTC Calculation: 469 R Axis:   87 Text Interpretation:  Atrial fibrillation Ventricular premature complex  Right bundle branch block No significant change was found Confirmed by  Wyvonnia Dusky  MD, [place]ak ([id number]) on 10/02/2013 8:26:27 AM      MDM   Final diagnoses:  CHF exacerbation  Atrial fibrillation with rapid ventricular response   Two-day history of shortness of breath with chest tightness. Atrial fibrillation unchanged and EKG without ST changes. Diminished breath sounds throughout with wheezing.  Nebs and steroids given via EMS.  CXR, EKG.  Will obtain CT head and imaging given recent fall and blood thinner use.  CT head and C-spine negative. No evidence of fractures. Chest x-ray with vascular congestion but improved from previous. BNP elevated at 6000. Will be given IV Lasix. Suspect diastolic heart failure as source of dyspnea. EF normal on last echo in January. Slight CO2 retention on ABG. PE unlikely as patient on eliquis.  Creatinine is improved from baseline. Hyperkalemia 5.7 without EKG changes. Kayexalate given.  Patient developed A. fib with RVR while in the ED. Cardizem drip was started for rate control. She is stable on nasal cannula. Stepdown admission d/w Dr. Inis Sizer.  CRITICAL CARE Performed by: Ezequiel Essex Total critical care time: 30 Critical care time was exclusive of separately billable procedures and treating other patients. Critical care was necessary to treat or prevent imminent or life-threatening deterioration. Critical care was time spent personally by me on the following activities:  development of treatment plan with patient and/or surrogate as well as  nursing, discussions with consultants, evaluation of patient's response to treatment, examination of patient, obtaining history from patient or surrogate, ordering and performing treatments and interventions, ordering and review of laboratory studies, ordering and review of radiographic studies, pulse oximetry and re-evaluation of patient's condition.     Ezequiel Essex, MD 10/02/13 2039

## 2013-10-03 DIAGNOSIS — I509 Heart failure, unspecified: Secondary | ICD-10-CM

## 2013-10-03 LAB — CBC
HEMATOCRIT: 33.1 % — AB (ref 36.0–46.0)
Hemoglobin: 10.4 g/dL — ABNORMAL LOW (ref 12.0–15.0)
MCH: 29.4 pg (ref 26.0–34.0)
MCHC: 31.4 g/dL (ref 30.0–36.0)
MCV: 93.5 fL (ref 78.0–100.0)
PLATELETS: 119 10*3/uL — AB (ref 150–400)
RBC: 3.54 MIL/uL — AB (ref 3.87–5.11)
RDW: 17.4 % — ABNORMAL HIGH (ref 11.5–15.5)
WBC: 8.3 10*3/uL (ref 4.0–10.5)

## 2013-10-03 LAB — BASIC METABOLIC PANEL
BUN: 74 mg/dL — ABNORMAL HIGH (ref 6–23)
CALCIUM: 9.1 mg/dL (ref 8.4–10.5)
CHLORIDE: 105 meq/L (ref 96–112)
CO2: 19 meq/L (ref 19–32)
Creatinine, Ser: 2.46 mg/dL — ABNORMAL HIGH (ref 0.50–1.10)
GFR calc Af Amer: 21 mL/min — ABNORMAL LOW (ref >90)
GFR calc non Af Amer: 18 mL/min — ABNORMAL LOW (ref >90)
Glucose, Bld: 121 mg/dL — ABNORMAL HIGH (ref 70–99)
Potassium: 4.6 mEq/L (ref 3.7–5.3)
SODIUM: 142 meq/L (ref 137–147)

## 2013-10-03 LAB — GLUCOSE, CAPILLARY
GLUCOSE-CAPILLARY: 142 mg/dL — AB (ref 70–99)
GLUCOSE-CAPILLARY: 101 mg/dL — AB (ref 70–99)
GLUCOSE-CAPILLARY: 120 mg/dL — AB (ref 70–99)
GLUCOSE-CAPILLARY: 134 mg/dL — AB (ref 70–99)
GLUCOSE-CAPILLARY: 135 mg/dL — AB (ref 70–99)
GLUCOSE-CAPILLARY: 165 mg/dL — AB (ref 70–99)
GLUCOSE-CAPILLARY: 287 mg/dL — AB (ref 70–99)
Glucose-Capillary: 165 mg/dL — ABNORMAL HIGH (ref 70–99)
Glucose-Capillary: 187 mg/dL — ABNORMAL HIGH (ref 70–99)
Glucose-Capillary: 226 mg/dL — ABNORMAL HIGH (ref 70–99)
Glucose-Capillary: 305 mg/dL — ABNORMAL HIGH (ref 70–99)
Glucose-Capillary: 125 mg/dL — ABNORMAL HIGH (ref 70–99)
Glucose-Capillary: 146 mg/dL — ABNORMAL HIGH (ref 70–99)
Glucose-Capillary: 236 mg/dL — ABNORMAL HIGH (ref 70–99)

## 2013-10-03 LAB — TROPONIN I
Troponin I: <0.3 ng/mL (ref <0.30)
Troponin I: <0.3 ng/mL (ref <0.30)

## 2013-10-03 LAB — HEMOGLOBIN A1C
Hgb A1c MFr Bld: 9.9 % — ABNORMAL HIGH (ref <5.7)
MEAN PLASMA GLUCOSE: 237 mg/dL — AB (ref <117)

## 2013-10-03 MED ORDER — FUROSEMIDE 10 MG/ML IJ SOLN
80.0000 mg | Freq: Two times a day (BID) | INTRAMUSCULAR | Status: DC
Start: 2013-10-03 — End: 2013-10-04
  Administered 2013-10-03 – 2013-10-04 (×2): 80 mg via INTRAVENOUS
  Filled 2013-10-03 (×2): qty 8

## 2013-10-03 MED ORDER — GUAIFENESIN 100 MG/5ML PO SYRP
200.0000 mg | ORAL_SOLUTION | ORAL | Status: DC | PRN
  Administered 2013-10-03 – 2013-10-06 (×4): 200 mg via ORAL
  Filled 2013-10-03 (×4): qty 10

## 2013-10-03 MED ORDER — INSULIN GLARGINE 100 UNIT/ML ~~LOC~~ SOLN
26.0000 [IU] | Freq: Every day | SUBCUTANEOUS | Status: DC
  Administered 2013-10-03: 26 [IU] via SUBCUTANEOUS
  Filled 2013-10-03 (×2): qty 0.26

## 2013-10-03 MED ORDER — INSULIN ASPART 100 UNIT/ML ~~LOC~~ SOLN
0.0000 [IU] | Freq: Three times a day (TID) | SUBCUTANEOUS | Status: DC
  Administered 2013-10-04 (×2): 11 [IU] via SUBCUTANEOUS

## 2013-10-03 MED ORDER — LORAZEPAM 0.5 MG PO TABS
0.5000 mg | ORAL_TABLET | Freq: Once | ORAL | Status: AC
  Administered 2013-10-03: 0.5 mg via ORAL
  Filled 2013-10-03: qty 1

## 2013-10-03 MED ORDER — ACETAMINOPHEN 325 MG PO TABS
325.0000 mg | ORAL_TABLET | Freq: Once | ORAL | Status: AC
  Administered 2013-10-03: 325 mg via ORAL
  Filled 2013-10-03: qty 1

## 2013-10-03 MED ORDER — INSULIN GLARGINE 100 UNIT/ML ~~LOC~~ SOLN
10.0000 [IU] | SUBCUTANEOUS | Status: DC
  Administered 2013-10-03: 10 [IU] via SUBCUTANEOUS
  Filled 2013-10-03: qty 0.1

## 2013-10-03 MED ORDER — INSULIN ASPART 100 UNIT/ML ~~LOC~~ SOLN
4.0000 [IU] | Freq: Three times a day (TID) | SUBCUTANEOUS | Status: DC
  Administered 2013-10-04: 4 [IU] via SUBCUTANEOUS

## 2013-10-03 MED ORDER — INSULIN ASPART 100 UNIT/ML ~~LOC~~ SOLN
1.0000 [IU] | SUBCUTANEOUS | Status: DC
  Administered 2013-10-03: 2 [IU] via SUBCUTANEOUS

## 2013-10-03 MED ORDER — INSULIN ASPART 100 UNIT/ML ~~LOC~~ SOLN
1.0000 [IU] | Freq: Three times a day (TID) | SUBCUTANEOUS | Status: DC
Start: 2013-10-03 — End: 2013-10-03

## 2013-10-03 NOTE — Progress Notes (Signed)
Utilization review completed.  

## 2013-10-03 NOTE — Clinical Documentation Improvement (Signed)
  Possible Clinical Conditions?      Acute Respiratory Failure     Acute on Chronic Respiratory Failure     Chronic Respiratory Failure  Supporting Information:  Per ED Physician, "Presents with a two-day history of shortness of breath associated with chest tightness. She's been wearing oxygen at the nursing home all the time of which she normally wears intermittently. She was initially hypoxic 87% on room air (in the ED). She reports improvement receiving nebulizers and steroids via EMS."   Diagnostic:  Per ED Physician, "Slight CO2 retention on ABG."  Venous blood gas:  pH 7.23, pCO2 52.3, pO2 26.0, Bicarbonate 21.7.    Treatment: Placed on 4L of oxygen per nasal cannula.  Received nebulizers and steroids by EMS en route to the hospital.    Thank You,  Posey Pronto, RN, BSN, [place] Documentation Improvement Specialist HIM department--[place] Office [phone]

## 2013-10-03 NOTE — Progress Notes (Signed)
Report given to receiving RN, [place].  Patient to be transferred from [place] to 3E27.  Patient's meds, chart, and personal belongings sent with patient.  Family present during transfer.  Patient transported via bed, on tele monitor, and 2 L [place].  Safety measures maintained.  Doran Clay, RN

## 2013-10-03 NOTE — Progress Notes (Signed)
[name] TEAM 1 - Stepdown/ICU TEAM Progress Note  [name] YWV:[id number] DOB: [birth year] DOA: 10/02/2013 PCP: Sherrie Mustache, MD  Admit HPI / Brief Narrative: 73 y.o. F w/ history of chronic diastolic CHF, uncontrolled diabetes mellitus, hypertension, sleep apnea, hypertension, CAD history of MI in the past, and anemia who presented w/ complaints of worsening shortness of breath x1 week. She also reported chest pain described as a tightness, intermittent up to 10 out of 10 in intensity sometimes. Patient admitted to leg swelling over the past several weeks. She was seen in the ED and imaging studies were negative for any fractures. CT of head showed no acute findings. BNP was elevated at 6285, chest x-ray showed improvement in central venous pulmonary congestion. She was treated with IV Lasix, steroids and nebs. Initial EKG showed atrial fibrillation at 94, but followup tele monitoring she was found to be in atrial fibrillation with rapid ventricular rate response and started on Cardizem drip. Patient's blood glucose was greater than 400, potassium 5.9, CO2 of 20. She was given insulin, Kayexalate and admitted for further evaluation and management.   HPI/Subjective: Patient states that she is breathing somewhat better but is not yet to her baseline.  She denies chest pain nausea vomiting or abdominal pain.  Assessment/Plan:  Chronic Afib w/ acute RVR Compliance w/ medications has been questioned in the past - TSH normal - rate as quickly, under control - resume prescribed home regimen and follow on telemetry - continue Eliquis (unclear if pt was taking at home)  Acute exacerbation of chronic diastolic CHF TTE 8/54/6270 w/ EF 60-65%, no WMA, mild Pulm HTN, dilated LA - fluid restrict - aggressively diuresed - follow in and outs  Acute hypoxic respiratory failure  Due to pulmonary edema related to atrial fibrillation with RVR and acute diastolic CHF exacerbation  Hyperkalemia  K+  5.9 at presentation - resolved  DM CBG reasonably controlled in acute setting - A1c 9.9 - follow - educate on compliance with medications and diet  HTN Blood pressure currently well controlled  CKD stage IV crt 2.56 Nov 2014 - renal fxn appears stable at this time - follow with diuresis  Hx of CVA  HLD Continue prescribed home medication  Obesity - Body mass index is 45.47 kg/(m^2).  CAD  Medically managed - stenosis of RCA per cardiac cath in 2012 too small for intervention  Code Status: FULL Family Communication: no family present at time of exam Disposition Plan: Stable for transfer to telemetry bed  Consultants: None  Procedures: None  Antibiotics: None  DVT prophylaxis: Eliquis  Objective: Blood pressure 137/46, pulse 90, temperature 97.7 F (36.5 C), temperature source Oral, resp. rate 20, height 5\' 3"  (1.6 m), weight 116.4 kg (256 lb 9.9 oz), SpO2 100.00%.  Intake/Output Summary (Last 24 hours) at 10/03/13 1123 Last data filed at 10/03/13 0800  Gross per 24 hour  Intake 480.56 ml  Output   1551 ml  Net -1070.44 ml   Exam: General: No acute respiratory distress Lungs: Mild bibasilar crackles with no wheeze  Cardiovascular: Irregularly irregular - rate controlled Abdomen: Nontender, nondistended, soft, bowel sounds positive, no rebound, no ascites, no appreciable mass Extremities: No significant cyanosis, clubbing;  2+ edema bilateral lower extremities  Data Reviewed: Basic Metabolic Panel:  Recent Labs Lab 10/02/13 0905 10/02/13 1149 10/02/13 2138 10/03/13 0545  NA 138  --  139 142  K 5.9* 5.8* 4.8 4.6  CL 105  --  103 105  CO2 20  --  19 19  GLUCOSE 413*  --  505* 121*  BUN 74*  --  72* 74*  CREATININE 2.33*  --  2.23* 2.46*  CALCIUM 8.9  --  8.8 9.1   Liver Function Tests:  Recent Labs Lab 10/02/13 0905  AST 22  ALT 29  ALKPHOS 98  BILITOT 0.4  PROT 7.4  ALBUMIN 3.1*   CBC:  Recent Labs Lab 10/02/13 0905 10/03/13 0545    WBC 8.9 8.3  NEUTROABS 6.8  --   HGB 10.9* 10.4*  HCT 34.6* 33.1*  MCV 94.0 93.5  PLT 105* 119*   Cardiac Enzymes:  Recent Labs Lab 10/02/13 0905 10/02/13 2138 10/02/13 2320 10/03/13 0600  TROPONINI <0.30 <0.30 <0.30 <0.30   BNP (last 3 results)  Recent Labs  07/01/13 0552 07/03/13 0457 10/02/13 0905  PROBNP 4028.0* 3715.0* 6285.0*   CBG:  Recent Labs Lab 10/03/13 0631 10/03/13 0740 10/03/13 0807 10/03/13 0843 10/03/13 0925  GLUCAP 120* 125* 134* 135* 146*    Recent Results (from the past 240 hour(s))  MRSA PCR SCREENING     Status: None   Collection Time    10/02/13  3:46 PM      Result Value Ref Range Status   MRSA by PCR NEGATIVE  NEGATIVE Final   Comment:            The GeneXpert MRSA Assay (FDA     approved for NASAL specimens     only), is one component of a     comprehensive MRSA colonization     surveillance program. It is not     intended to diagnose MRSA     infection nor to guide or     monitor treatment for     MRSA infections.     Studies:  Recent x-ray studies have been reviewed in detail by the Attending Physician  Scheduled Meds:  Scheduled Meds: . apixaban  5 mg Oral BID  . carvedilol  6.25 mg Oral BID WC  . vitamin B-12  500 mcg Oral Daily  . ferrous sulfate  325 mg Oral BID  . furosemide  40 mg Intravenous BID  . insulin aspart  1-3 Units Subcutaneous 6 times per day  . insulin glargine  10 Units Subcutaneous Q24H  . isosorbide mononitrate  30 mg Oral Daily  . nystatin   Topical TID  . pregabalin  150 mg Oral BID  . Vitamin D (Ergocalciferol)  50,000 Units Oral Q Thu    Time spent on care of this patient: 35 mins   Cherene Altes , MD   [facility] Office  [phone] Pager - Text Page per Amion as per below:  On-Call/Text Page:      [email or web address]      password [email or web address]  If 7PM-7AM, please contact night-coverage [email or web address] Password [email or web address] 10/03/2013, 11:23 AM   LOS: 1 day

## 2013-10-03 NOTE — Progress Notes (Addendum)
Inpatient Diabetes Program Recommendations  AACE/ADA: New Consensus Statement on Inpatient Glycemic Control (2013)  Target Ranges:  Prepandial:   less than 140 mg/dL      Peak postprandial:   less than 180 mg/dL (1-2 hours)      Critically ill patients:  140 - 180 mg/dL   Reason for Assessment: Results for GLENDORIS, NODARSE (MRN [id number]) as of 10/03/2013 14:18  Ref. Range 10/02/2013 21:38  Hemoglobin A1C Latest Range: <5.7 % 9.9 (H)  Results for KYNSLI, HAAPALA (MRN [id number]) as of 10/03/2013 14:18  Ref. Range 10/02/2013 09:05 10/02/2013 21:38 10/03/2013 05:45  Glucose Latest Range: 70-99 mg/dL 413 (H) 505 (H) 121 (H)   Diabetes history: Type 2 diabetes Outpatient Diabetes medications: Lantus 40 units q HS, Humalog 10 units tid with meals Current orders for Inpatient glycemic control: Patient has transitioned off insulin drip.  Novolog 1-2-3 q 4 hours.  Please consider discontinuation of current correction scale, changing to Glycemic control order set, and restarting a portion of home dose of Lantus.  Consider adding Lantus 20 units daily, Novolog moderate correction tid with meals and Novolog meal coverage 4 units tid with meals (Hold if patient eats less than 50%)/    Thanks, Adah Perl, RN, BC-ADM Inpatient Diabetes Coordinator Pager [phone]   Addendum:  Spoke with patient at bedside.  She is receptive to my visit.  She is homebound.  States she doesn't know why her blood sugars go up like they do when she "takes her medicine like she is supposed to and doesn't eat any candy between meals or anything".  Checks CBG's before each meal and at bedtime.  Has had Home Health in the past.  Would like to have [place] visits after discharge.  Recommend that [place] visit after discharge and help assess diabetes management and review diabetes self-care in her home setting.  Thank you.  Kele Barthelemy S. Marcelline Mates, RN, CNS, CDE Inpatient Diabetes Program, team pager [phone]

## 2013-10-03 NOTE — Progress Notes (Signed)
Physical Therapy Evaluation Patient Details Name: [name] MRN: [id number] DOB: [birth year] Today's Date: 10/03/2013   History of Present Illness  Patient is a 73 yo female admitted with CHF/SOB, Afib with RVR, fall, chest pain, hyperkalemia, uncontrolled DM.  Clinical Impression  Patient presents with problems listed below.  Will benefit from acute PT to maximize independence prior to discharge. Recommend HHPT for continued therapy at discharge.  Patient will need to function at supervision level for mobility and gait to return home with family.  If doesn't progress well, may need to consider SNF.    Follow Up Recommendations Home health PT;Supervision for mobility/OOB;Supervision - Intermittent    Equipment Recommendations  None recommended by PT    Recommendations for Other Services       Precautions / Restrictions Precautions Precautions: Fall Restrictions Weight Bearing Restrictions: No      Mobility  Bed Mobility Overal bed mobility: Needs Assistance Bed Mobility: Supine to Sit;Sit to Supine     Supine to sit: Min guard;HOB elevated Sit to supine: Min guard   General bed mobility comments: Verbal cues for technique.  Assist to bring trunk to sitting and to scoot to EOB.  Once upright, patient able to sit with supervision x 8 minutes.  Dyspnea 3-4/4 with mobility on O2.  Transfers                    Ambulation/Gait                Stairs            Wheelchair Mobility    Modified Rankin (Stroke Patients Only)       Balance Overall balance assessment: Needs assistance Sitting-balance support: Feet unsupported;Single extremity supported Sitting balance-Leahy Scale: Fair                                       Pertinent Vitals/Pain Pain in BLE's 10/10 (restless leg) Dyspnea at 3-4/4 with mobility.    Home Living Family/patient expects to be discharged to:: Private residence Living Arrangements: Children  (Son) Available Help at Discharge: Family;Available PRN/intermittently Type of Home: House Home Access: Ramped entrance     Home Layout: One level Home Equipment: Walker - 2 wheels;Wheelchair - manual;Cane - single point;Shower seat;Bedside commode (Sleeps in recliner)      Prior Function Level of Independence: Needs assistance   Gait / Transfers Assistance Needed: Supervision with ambulation with RW.  ADL's / Homemaking Assistance Needed: Assist for meal prep, housekeeping        Hand Dominance   Dominant Hand: Right    Extremity/Trunk Assessment   Upper Extremity Assessment: Overall WFL for tasks assessed           Lower Extremity Assessment: Generalized weakness         Communication   Communication: No difficulties (occasional difficulty coming up with right word)  Cognition Arousal/Alertness: Awake/alert Behavior During Therapy: WFL for tasks assessed/performed Overall Cognitive Status: Within Functional Limits for tasks assessed                      General Comments      Exercises        Assessment/Plan    PT Assessment Patient needs continued PT services  PT Diagnosis Difficulty walking;Generalized weakness;Acute pain   PT Problem List Decreased strength;Decreased activity tolerance;Decreased balance;Decreased mobility;Cardiopulmonary status limiting activity;Obesity;Pain  PT  Treatment Interventions DME instruction;Gait training;Functional mobility training;Therapeutic exercise;Balance training;Patient/family education   PT Goals (Current goals can be found in the Care Plan section) Acute Rehab PT Goals Patient Stated Goal: To get stronger PT Goal Formulation: With patient Time For Goal Achievement: 10/10/13 Potential to Achieve Goals: Fair    Frequency Min 3X/week   Barriers to discharge Decreased caregiver support Family available prn    Co-evaluation               End of Session Equipment Utilized During Treatment:  Oxygen Activity Tolerance: Patient limited by fatigue (Limited by dyspnea on exertion) Patient left: in bed;with call bell/phone within reach Nurse Communication: Mobility status         Time: 1900-1918 PT Time Calculation (min): 18 min   Charges:   PT Evaluation $Initial PT Evaluation Tier I: 1 Procedure PT Treatments $Therapeutic Activity: 8-22 mins   PT G Codes:          Despina Pole 28-Oct-2013, 7:33 PM Carita Pian. Sanjuana Kava, [place] Pager [phone]

## 2013-10-04 ENCOUNTER — Inpatient Hospital Stay (HOSPITAL_COMMUNITY): Payer: Medicare Other

## 2013-10-04 DIAGNOSIS — E875 Hyperkalemia: Secondary | ICD-10-CM

## 2013-10-04 DIAGNOSIS — J9600 Acute respiratory failure, unspecified whether with hypoxia or hypercapnia: Secondary | ICD-10-CM

## 2013-10-04 DIAGNOSIS — J9601 Acute respiratory failure with hypoxia: Secondary | ICD-10-CM | POA: Diagnosis present

## 2013-10-04 LAB — BLOOD GAS, ARTERIAL
Acid-base deficit: 2.4 mmol/L — ABNORMAL HIGH (ref 0.0–2.0)
Bicarbonate: 23.1 mEq/L (ref 20.0–24.0)
Drawn by: 10552
O2 Content: 6 L/min
O2 Saturation: 95 %
PCO2 ART: 48.7 mmHg — AB (ref 35.0–45.0)
PH ART: 7.298 — AB (ref 7.350–7.450)
Patient temperature: 98.6
TCO2: 24.6 mmol/L (ref 0–100)
pO2, Arterial: 79.6 mmHg — ABNORMAL LOW (ref 80.0–100.0)

## 2013-10-04 LAB — BASIC METABOLIC PANEL
BUN: 78 mg/dL — ABNORMAL HIGH (ref 6–23)
CHLORIDE: 98 meq/L (ref 96–112)
CO2: 24 mEq/L (ref 19–32)
Calcium: 8.1 mg/dL — ABNORMAL LOW (ref 8.4–10.5)
Creatinine, Ser: 2.6 mg/dL — ABNORMAL HIGH (ref 0.50–1.10)
GFR calc non Af Amer: 17 mL/min — ABNORMAL LOW (ref >90)
GFR, EST AFRICAN AMERICAN: 20 mL/min — AB (ref >90)
Glucose, Bld: 320 mg/dL — ABNORMAL HIGH (ref 70–99)
Potassium: 3.9 mEq/L (ref 3.7–5.3)
SODIUM: 135 meq/L — AB (ref 137–147)

## 2013-10-04 LAB — GLUCOSE, CAPILLARY
GLUCOSE-CAPILLARY: 265 mg/dL — AB (ref 70–99)
GLUCOSE-CAPILLARY: 267 mg/dL — AB (ref 70–99)
GLUCOSE-CAPILLARY: 222 mg/dL — AB (ref 70–99)
Glucose-Capillary: 297 mg/dL — ABNORMAL HIGH (ref 70–99)
Glucose-Capillary: 200 mg/dL — ABNORMAL HIGH (ref 70–99)

## 2013-10-04 MED ORDER — PREDNISONE 20 MG PO TABS
40.0000 mg | ORAL_TABLET | Freq: Every day | ORAL | Status: DC
  Filled 2013-10-04: qty 2

## 2013-10-04 MED ORDER — LEVOFLOXACIN 750 MG PO TABS: 750.0000 mg | ORAL_TABLET | Freq: Every day | ORAL | Status: DC

## 2013-10-04 MED ORDER — INSULIN ASPART 100 UNIT/ML ~~LOC~~ SOLN
0.0000 [IU] | SUBCUTANEOUS | Status: DC
  Administered 2013-10-04: 2 [IU] via SUBCUTANEOUS
  Administered 2013-10-04 – 2013-10-05 (×3): 5 [IU] via SUBCUTANEOUS
  Administered 2013-10-05: 9 [IU] via SUBCUTANEOUS
  Administered 2013-10-05: 3 [IU] via SUBCUTANEOUS
  Administered 2013-10-05: 7 [IU] via SUBCUTANEOUS
  Administered 2013-10-05 – 2013-10-06 (×2): 3 [IU] via SUBCUTANEOUS
  Administered 2013-10-06: 9 [IU] via SUBCUTANEOUS
  Administered 2013-10-06 (×2): 7 [IU] via SUBCUTANEOUS
  Administered 2013-10-06: 9 [IU] via SUBCUTANEOUS

## 2013-10-04 MED ORDER — LEVOFLOXACIN IN D5W 750 MG/150ML IV SOLN
750.0000 mg | Freq: Once | INTRAVENOUS | Status: DC
  Filled 2013-10-04: qty 150

## 2013-10-04 MED ORDER — INSULIN ASPART 100 UNIT/ML ~~LOC~~ SOLN: 0.0000 [IU] | SUBCUTANEOUS | Status: DC

## 2013-10-04 MED ORDER — INSULIN GLARGINE 100 UNIT/ML ~~LOC~~ SOLN
30.0000 [IU] | Freq: Every day | SUBCUTANEOUS | Status: DC
  Filled 2013-10-04 (×2): qty 0.3

## 2013-10-04 MED ORDER — METHYLPREDNISOLONE SODIUM SUCC 125 MG IJ SOLR
60.0000 mg | Freq: Four times a day (QID) | INTRAMUSCULAR | Status: DC
  Administered 2013-10-04: 60 mg via INTRAVENOUS
  Filled 2013-10-04 (×4): qty 0.96

## 2013-10-04 MED ORDER — INSULIN ASPART 100 UNIT/ML ~~LOC~~ SOLN: 2.0000 [IU] | SUBCUTANEOUS | Status: DC

## 2013-10-04 MED ORDER — DILTIAZEM LOAD VIA INFUSION
10.0000 mg | Freq: Once | INTRAVENOUS | Status: AC
  Administered 2013-10-04: 10 mg via INTRAVENOUS
  Filled 2013-10-04: qty 10

## 2013-10-04 MED ORDER — SODIUM CHLORIDE 0.9 % IV SOLN
1.5000 g | Freq: Two times a day (BID) | INTRAVENOUS | Status: DC
  Administered 2013-10-04 – 2013-10-07 (×6): 1.5 g via INTRAVENOUS
  Filled 2013-10-04 (×8): qty 1.5

## 2013-10-04 MED ORDER — LEVALBUTEROL HCL 0.63 MG/3ML IN NEBU
0.6300 mg | INHALATION_SOLUTION | Freq: Four times a day (QID) | RESPIRATORY_TRACT | Status: DC
  Administered 2013-10-04 – 2013-10-05 (×5): 0.63 mg via RESPIRATORY_TRACT
  Filled 2013-10-04 (×9): qty 3

## 2013-10-04 MED ORDER — HALOPERIDOL LACTATE 5 MG/ML IJ SOLN: 1.0000 mg | Freq: Four times a day (QID) | INTRAMUSCULAR | Status: DC | PRN

## 2013-10-04 MED ORDER — FUROSEMIDE 10 MG/ML IJ SOLN
80.0000 mg | Freq: Three times a day (TID) | INTRAMUSCULAR | Status: DC
  Administered 2013-10-04 – 2013-10-05 (×3): 80 mg via INTRAVENOUS
  Filled 2013-10-04 (×5): qty 8

## 2013-10-04 MED ORDER — LEVOFLOXACIN 500 MG PO TABS: 500.0000 mg | ORAL_TABLET | ORAL | Status: DC

## 2013-10-04 MED ORDER — DILTIAZEM HCL 100 MG IV SOLR
5.0000 mg/h | INTRAVENOUS | Status: DC
  Administered 2013-10-04: 10 mg/h via INTRAVENOUS
  Administered 2013-10-04 – 2013-10-05 (×2): 5 mg/h via INTRAVENOUS
  Filled 2013-10-04 (×3): qty 100

## 2013-10-04 NOTE — Progress Notes (Signed)
PT Cancellation Note  Patient Details Name: [name] MRN: [id number] DOB: [birth year]   Cancelled Treatment:    Reason Eval/Treat Not Completed: Medical issues which prohibited therapy (Pt having HR issues.  Nursing asked PT to hold. )Will return as able.  Thanks.   Jordane Hisle [place] 10/04/2013, 9:25 AM [facility] [phone] (pager)

## 2013-10-04 NOTE — Evaluation (Signed)
Clinical/Bedside Swallow Evaluation Patient Details  Name: [name] MRN: [id number] Date of Birth: Mar 12, 1941  Today's Date: 10/04/2013 Time: 9417-4081 SLP Time Calculation (min): 20 min  Past Medical History:  Past Medical History  Diagnosis Date  . Diabetes mellitus   . Hypertension   . Hyperlipemia   . Fibromyalgia   . Obesity   . Sleep apnea     No CPAP  . Anxiety   . Hiatal hernia   . NSTEMI (non-ST elevated myocardial infarction) 04/27/2011  . Anemia 04/27/2011  . Restless leg syndrome   . CAD (coronary artery disease)     small dominant right coronary artery with diffuse 95% stenosis. The LAD had 70% stenosis in the mid vessel beyond the diagonal branch but was a small vessel. Circumflex had proximal 30% stenosis.   Marland Kitchen External hemorrhoids   . Arrhythmia 10/11/2011  . Community acquired pneumonia 06/24/2012  . Renal insufficiency 05/06/2011  . Shingles   . CHF exacerbation 10/02/2013   Past Surgical History:  Past Surgical History  Procedure Laterality Date  . Appendectomy    . Fracture surgery      left humerous  . Ganglion cyst removal     HPI:  73 y.o. F w/ history of chronic diastolic CHF, uncontrolled diabetes mellitus, hypertension, sleep apnea, hypertension, CAD history of MI in the past, and anemia who presented w/ complaints of worsening shortness of breath x1 week. She also reported chest pain described as a tightness, intermittent up to 10 out of 10 in intensity sometimes. Patient admitted to leg swelling over the past several weeks. She was seen in the ED and imaging studies were negative for any fractures. CT of head showed no acute findings. BNP was elevated at 6285, chest x-ray showed improvement in central venous pulmonary congestion. She was treated with IV Lasix, steroids and nebs. Initial EKG showed atrial fibrillation at 94, but followup tele monitoring she was found to be in atrial fibrillation with rapid ventricular rate response and started on  Cardizem drip. Patient's blood glucose was greater than 400, potassium 5.9, CO2 of 20. She was given insulin, Kayexalate and admitted for further evaluation and management.    Assessment / Plan / Recommendation Clinical Impression  Pt demonstrated immediate s/s of aspiration with first cup sip of thin liquid; this reduced on subsequent sips when cued to take small sips but pt continued to demonstrate wet vocal quality throughout eval. It is unclear whether pt was silently aspirating on subsequent sips or if wet vocal quality was d/t residue from initial cup sip. Pt demonstrated no s/s of aspiration with foods. Spoke with MD who would like for pt to have objective test and remain NPO. Recommendations are as follows- NPO, allow ice chips following thorough oral care, meds via alt means, diet rec pending MBS. Speech will continue to follow.    Aspiration Risk  Severe    Diet Recommendation NPO;Ice chips PRN after oral care   Liquid Administration via: Cup;No straw Medication Administration: Via alternative means    Other  Recommendations Recommended Consults: MBS;Other (Comment) (FEES if unable to complete MBS) Oral Care Recommendations: Oral care Q4 per protocol   Follow Up Recommendations       Frequency and Duration min 2x/week      Pertinent Vitals/Pain n/a    SLP Swallow Goals     Swallow Study Prior Functional Status       General HPI: 73 y.o. F w/ history of chronic diastolic CHF, uncontrolled diabetes  mellitus, hypertension, sleep apnea, hypertension, CAD history of MI in the past, and anemia who presented w/ complaints of worsening shortness of breath x1 week. She also reported chest pain described as a tightness, intermittent up to 10 out of 10 in intensity sometimes. Patient admitted to leg swelling over the past several weeks. She was seen in the ED and imaging studies were negative for any fractures. CT of head showed no acute findings. BNP was elevated at 6285, chest x-ray  showed improvement in central venous pulmonary congestion. She was treated with IV Lasix, steroids and nebs. Initial EKG showed atrial fibrillation at 94, but followup tele monitoring she was found to be in atrial fibrillation with rapid ventricular rate response and started on Cardizem drip. Patient's blood glucose was greater than 400, potassium 5.9, CO2 of 20. She was given insulin, Kayexalate and admitted for further evaluation and management.  Type of Study: Bedside swallow evaluation Diet Prior to this Study: NPO Temperature Spikes Noted: No Respiratory Status: Nasal cannula History of Recent Intubation: No Behavior/Cognition: Alert;Cooperative Oral Cavity - Dentition: Edentulous Self-Feeding Abilities: Needs assist;Able to feed self Patient Positioning: Upright in bed Baseline Vocal Quality: Wet Volitional Cough: Strong;Congested Volitional Swallow: Able to elicit    Oral/Motor/Sensory Function Overall Oral Motor/Sensory Function: Appears within functional limits for tasks assessed Labial ROM: Within Functional Limits Labial Symmetry: Within Functional Limits Labial Strength: Within Functional Limits Lingual ROM: Within Functional Limits Lingual Symmetry: Within Functional Limits Lingual Strength: Within Functional Limits   Ice Chips Ice chips: Not tested   Thin Liquid Thin Liquid: Impaired Presentation: Cup;Self Fed Pharyngeal  Phase Impairments: Suspected delayed Swallow;Multiple swallows;Cough - Immediate;Wet Vocal Quality    Nectar Thick Nectar Thick Liquid: Not tested   Honey Thick Honey Thick Liquid: Not tested   Puree Puree: Within functional limits Presentation: Self Fed;Spoon   Solid   GO    Solid: Impaired Presentation: Self Fed Oral Phase Impairments: Impaired mastication Pharyngeal Phase Impairments: Suspected delayed Swallow;Multiple swallows       [name] K Brenda Ocon, MA, CCC-SLP 10/04/2013,3:58 PM

## 2013-10-04 NOTE — Progress Notes (Signed)
Pt c/o shortness of breath and cough, breathing treatment given per RT and prn cough med given. Pt is also anxious, Kathline Magic NP notified and ordered ativan 0.5mg  po, order carried out. Will continue to monitor.

## 2013-10-04 NOTE — Progress Notes (Addendum)
[facility] PROGRESS NOTE Interim History: 73 y.o. F w/ history of chronic diastolic CHF, uncontrolled diabetes mellitus, hypertension, sleep apnea, hypertension, CAD history of MI in the past, and anemia who presented w/ complaints of worsening shortness of breath x1 week. She also reported chest pain described as a tightness, intermittent up to 10 out of 10 in intensity sometimes. Patient admitted to leg swelling over the past several weeks. She was seen in the ED and imaging studies were negative for any fractures. CT of head showed no acute findings. BNP was elevated at 6285, chest x-ray showed improvement in central venous pulmonary congestion. She was treated with IV Lasix, steroids and nebs. Initial EKG showed atrial fibrillation at 94, but followup tele monitoring she was found to be in atrial fibrillation with rapid ventricular rate response and started on Cardizem drip. Patient's blood glucose was greater than 400, potassium 5.9, CO2 of 20. She was given insulin, Kayexalate and admitted for further evaluation and management.   Filed Weights   10/02/13 1630 10/03/13 0500 10/04/13 0342  Weight: 115.6 kg (254 lb 13.6 oz) 116.4 kg (256 lb 9.9 oz) 113.9 kg (251 lb 1.7 oz)        Intake/Output Summary (Last 24 hours) at 10/04/13 1032 Last data filed at 10/04/13 0949  Gross per 24 hour  Intake    720 ml  Output   2550 ml  Net  -1830 ml     Assessment/Plan: Acute hypoxic respiratory failure multifactorial due to  Afib w/ acute RVR and acute diastolic heart failure: - None Compliance w/ medications has been questioned in the past - Back in a fib with RVR re-started diltiazem drip. - Cont coreg. - CXR this am showed pulmonary edema. - Wheezing on physical exam, start steroids, start Unasyn. - check SLP.  Acute exacerbation of chronic diastolic CHF:  - TTE 2/83/1517 w/ EF 60-65%, pt getting close to dry weight. - Increase IV lasix, strict I and O's replete electrolytes. - CRX  showed pulmonary edema. + JVD. - daily standing weights.  Acute confusional state/Delirium: - haldol PRN for agitation  Hyperkalemia: - K+ 5.9 at presentation  - resolved   DM  - Increase lantus  - A1c 9.9 - follow.  HTN: - high. - cont diuresis hopefully Bp will start to improve.  CKD stage IV  crt 2.56 Nov 2014 - renal fxn appears stable at this time - follow with diuresis   Hx of CVA   HLD  Continue prescribed home medication   Obesity - Body mass index is 45.47 kg/(m^2).   CAD  Medically managed - stenosis of RCA per cardiac cath in 2012 too small for intervention      Code Status: FULL  Family Communication: no family present at time of exam  Disposition Plan: inpatient  Consultants:  none  Procedures: ECHO: none  Antibiotics:  None  HPI/Subjective: No complains Objective: Filed Vitals:   10/04/13 0913 10/04/13 0930 10/04/13 0934 10/04/13 1014  BP: 133/91 86/72 137/75   Pulse: 109 108 115   Temp:      TempSrc:      Resp: 20 22    Height:      Weight:      SpO2:    94%     Exam:  General: Alert, awake, oriented x1, in no acute distress.  HEENT: No bruits, no goiter. +JVD Heart: Regular rate and rhythm, without murmurs, rubs, gallops.  Lungs: Good air movement, clear Abdomen: Soft, nontender, nondistended,  positive bowel sounds.     Data Reviewed: Basic Metabolic Panel:  Recent Labs Lab 10/02/13 0905 10/02/13 1149 10/02/13 2138 10/03/13 0545  NA 138  --  139 142  K 5.9* 5.8* 4.8 4.6  CL 105  --  103 105  CO2 20  --  19 19  GLUCOSE 413*  --  505* 121*  BUN 74*  --  72* 74*  CREATININE 2.33*  --  2.23* 2.46*  CALCIUM 8.9  --  8.8 9.1   Liver Function Tests:  Recent Labs Lab 10/02/13 0905  AST 22  ALT 29  ALKPHOS 98  BILITOT 0.4  PROT 7.4  ALBUMIN 3.1*   No results found for this basename: LIPASE, AMYLASE,  in the last 168 hours No results found for this basename: AMMONIA,  in the last 168 hours CBC:  Recent  Labs Lab 10/02/13 0905 10/03/13 0545  WBC 8.9 8.3  NEUTROABS 6.8  --   HGB 10.9* 10.4*  HCT 34.6* 33.1*  MCV 94.0 93.5  PLT 105* 119*   Cardiac Enzymes:  Recent Labs Lab 10/02/13 0905 10/02/13 2138 10/02/13 2320 10/03/13 0600  TROPONINI <0.30 <0.30 <0.30 <0.30   BNP (last 3 results)  Recent Labs  07/01/13 0552 07/03/13 0457 10/02/13 0905  PROBNP 4028.0* 3715.0* 6285.0*   CBG:  Recent Labs Lab 10/03/13 0925 10/03/13 1128 10/03/13 1619 10/03/13 2018 10/04/13 0629  GLUCAP 146* 165* 236* 287* 265*    Recent Results (from the past 240 hour(s))  MRSA PCR SCREENING     Status: None   Collection Time    10/02/13  3:46 PM      Result Value Ref Range Status   MRSA by PCR NEGATIVE  NEGATIVE Final   Comment:            The GeneXpert MRSA Assay (FDA     approved for NASAL specimens     only), is one component of a     comprehensive MRSA colonization     surveillance program. It is not     intended to diagnose MRSA     infection nor to guide or     monitor treatment for     MRSA infections.     Studies: Dg Chest Port 1 View  10/04/2013   CLINICAL DATA:  Dyspnea.  Worsening CHF.  Cough.  EXAM: PORTABLE CHEST - 1 VIEW  COMPARISON:  DG CHEST 1 VIEW dated 10/02/2013  FINDINGS: Shallow inspiration. Cardiac enlargement with mild pulmonary vascular congestion. Residual but improving infiltration in the right lung base.  IMPRESSION: Cardiac enlargement with pulmonary vascular congestion. No edema. Improving infiltrates in the bases with some residual on the right.   Electronically Signed   By: Lucienne Capers M.D.   On: 10/04/2013 06:28    Scheduled Meds: . apixaban  5 mg Oral BID  . carvedilol  6.25 mg Oral BID WC  . vitamin B-12  500 mcg Oral Daily  . ferrous sulfate  325 mg Oral BID  . furosemide  80 mg Intravenous BID  . insulin aspart  0-20 Units Subcutaneous TID WC  . insulin aspart  4 Units Subcutaneous TID WC  . insulin glargine  26 Units Subcutaneous QHS  .  isosorbide mononitrate  30 mg Oral Daily  . levalbuterol  0.63 mg Nebulization QID  . nystatin   Topical TID  . pregabalin  150 mg Oral BID  . Vitamin D (Ergocalciferol)  50,000 Units Oral Q Thu   Continuous Infusions: .  diltiazem (CARDIZEM) infusion 10 mg/hr (10/04/13 0937)     [facility] Pager [phone] If 8PM-8AM, please contact night-coverage at [email or web address], password [facility] 10/04/2013, 10:32 AM  LOS: 2 days

## 2013-10-04 NOTE — Progress Notes (Signed)
Pt's HR is in the 120's-140's sustained, pt still anxious, Kathline Magic NP notified and ordered to give the scheduled  AM dose of coreg 6.25mg  earlier and ordered stat CXR.

## 2013-10-04 NOTE — Progress Notes (Signed)
Pt's HR still sustained in the 130's-140's , Day RN notified Dr. Aileen Fass and ordered to start cardizem drip.

## 2013-10-04 NOTE — Progress Notes (Signed)
Patient alert this AM, became lethargic but easily arousable this afternoon.  CAM positive.  Rapid response called, MD aware and at bedside; orders updated and ABG ordered.  MD notified of ABG results, at bedside to reassess, determined patient to remain on unit at this time.  Cardizem drip started this AM and titrated up (see MAR) based on patient's heart rate sustaining in 140s.  Heart rate controlled <100 after cardizem drip titrated.  Patient incontinent intermittently throughout shift.  Family at bedside this evening.  Will continue to monitor.

## 2013-10-04 NOTE — Progress Notes (Signed)
Noted area of redness with white center on patient's labia; per patient, this is a "boil" and it has been present for "awhile".  White center approximately doubled in size during shift.  MD notified.  Will continue to monitor.

## 2013-10-05 ENCOUNTER — Inpatient Hospital Stay (HOSPITAL_COMMUNITY): Payer: Medicare Other

## 2013-10-05 LAB — BASIC METABOLIC PANEL
BUN: 80 mg/dL — AB (ref 6–23)
CO2: 24 mEq/L (ref 19–32)
Calcium: 8.3 mg/dL — ABNORMAL LOW (ref 8.4–10.5)
Chloride: 98 mEq/L (ref 96–112)
Creatinine, Ser: 2.62 mg/dL — ABNORMAL HIGH (ref 0.50–1.10)
GFR calc non Af Amer: 17 mL/min — ABNORMAL LOW (ref >90)
GFR, EST AFRICAN AMERICAN: 20 mL/min — AB (ref >90)
Glucose, Bld: 301 mg/dL — ABNORMAL HIGH (ref 70–99)
POTASSIUM: 4.6 meq/L (ref 3.7–5.3)
SODIUM: 138 meq/L (ref 137–147)

## 2013-10-05 LAB — GLUCOSE, CAPILLARY
GLUCOSE-CAPILLARY: 275 mg/dL — AB (ref 70–99)
GLUCOSE-CAPILLARY: 265 mg/dL — AB (ref 70–99)
GLUCOSE-CAPILLARY: 417 mg/dL — AB (ref 70–99)
Glucose-Capillary: 237 mg/dL — ABNORMAL HIGH (ref 70–99)
Glucose-Capillary: 246 mg/dL — ABNORMAL HIGH (ref 70–99)
Glucose-Capillary: 275 mg/dL — ABNORMAL HIGH (ref 70–99)
Glucose-Capillary: 305 mg/dL — ABNORMAL HIGH (ref 70–99)
Glucose-Capillary: 506 mg/dL — ABNORMAL HIGH (ref 70–99)
Glucose-Capillary: 536 mg/dL — ABNORMAL HIGH (ref 70–99)

## 2013-10-05 MED ORDER — INSULIN ASPART 100 UNIT/ML ~~LOC~~ SOLN
15.0000 [IU] | Freq: Once | SUBCUTANEOUS | Status: AC
  Administered 2013-10-05: 15 [IU] via SUBCUTANEOUS

## 2013-10-05 MED ORDER — PREGABALIN 25 MG PO CAPS
75.0000 mg | ORAL_CAPSULE | Freq: Every day | ORAL | Status: DC
  Administered 2013-10-06 – 2013-10-07 (×2): 75 mg via ORAL
  Filled 2013-10-05 (×2): qty 3

## 2013-10-05 MED ORDER — DILTIAZEM HCL 30 MG PO TABS
30.0000 mg | ORAL_TABLET | Freq: Four times a day (QID) | ORAL | Status: DC
Start: 2013-10-05 — End: 2013-10-07
  Administered 2013-10-05 – 2013-10-07 (×9): 30 mg via ORAL
  Filled 2013-10-05 (×12): qty 1

## 2013-10-05 MED ORDER — PREDNISONE 20 MG PO TABS
20.0000 mg | ORAL_TABLET | Freq: Every day | ORAL | Status: DC
  Filled 2013-10-05: qty 1

## 2013-10-05 MED ORDER — INSULIN GLARGINE 100 UNIT/ML ~~LOC~~ SOLN
45.0000 [IU] | Freq: Every day | SUBCUTANEOUS | Status: DC
  Administered 2013-10-05: 45 [IU] via SUBCUTANEOUS
  Filled 2013-10-05 (×2): qty 0.45

## 2013-10-05 MED ORDER — PREDNISONE 20 MG PO TABS
40.0000 mg | ORAL_TABLET | Freq: Every day | ORAL | Status: DC
  Administered 2013-10-05 – 2013-10-07 (×3): 40 mg via ORAL
  Filled 2013-10-05 (×4): qty 2

## 2013-10-05 NOTE — Progress Notes (Signed)
[facility] PROGRESS NOTE Interim History: 73 y.o. F w/ history of chronic diastolic CHF, uncontrolled diabetes mellitus, hypertension, sleep apnea, hypertension, CAD history of MI in the past, and anemia who presented w/ complaints of worsening shortness of breath x1 week. She also reported chest pain described as a tightness, intermittent up to 10 out of 10 in intensity sometimes. Patient admitted to leg swelling over the past several weeks. She was seen in the ED and imaging studies were negative for any fractures. CT of head showed no acute findings. BNP was elevated at 6285, chest x-ray showed improvement in central venous pulmonary congestion. She was treated with IV Lasix, steroids and nebs. Initial EKG showed atrial fibrillation at 94, but followup tele monitoring she was found to be in atrial fibrillation with rapid ventricular rate response and started on Cardizem drip. Patient's blood glucose was greater than 400, potassium 5.9, CO2 of 20. She was given insulin, Kayexalate and admitted for further evaluation and management.   Filed Weights   10/03/13 0500 10/04/13 0342 10/05/13 0500  Weight: 116.4 kg (256 lb 9.9 oz) 113.9 kg (251 lb 1.7 oz) 112.265 kg (247 lb 8 oz)        Intake/Output Summary (Last 24 hours) at 10/05/13 1000 Last data filed at 10/05/13 0951  Gross per 24 hour  Intake 540.33 ml  Output   1850 ml  Net -1309.67 ml     Assessment/Plan: Acute hypoxic respiratory failure multifactorial due to  Afib w/ acute RVR and acute diastolic heart failure: - None Compliance w/ medications has been questioned in the past - rate controlled change to oral diltiazem - Cont coreg. - Wheezing on physical exam, start steroids, start Unasyn. - FEES pending.  Acute exacerbation of chronic diastolic CHF:  - TTE 8/92/1194 w/ EF 60-65%, weight 112.2 kg - Cont IV lasix, strict I and O's replete electrolytes. - cont to diurese, cr. Stable.  Acute confusional  state/Delirium: - haldol PRN for agitation  Hyperkalemia: - K+ 5.9 at presentation  - resolved   DM  - Increase lantus  - A1c 9.9 - follow.  HTN: - high. - cont diuresis hopefully Bp will start to improve.  CKD stage IV  crt 2.56 Nov 2014 - renal fxn appears stable at this time - follow with diuresis   Hx of CVA   HLD  Continue prescribed home medication   Obesity - Body mass index is 45.47 kg/(m^2).   CAD  Medically managed - stenosis of RCA per cardiac cath in 2012 too small for intervention      Code Status: FULL  Family Communication: no family present at time of exam  Disposition Plan: inpatient  Consultants:  none  Procedures: ECHO: none  Antibiotics:  None  HPI/Subjective: No complains Objective: Filed Vitals:   10/05/13 0030 10/05/13 0434 10/05/13 0500 10/05/13 0808  BP: 125/60 138/48    Pulse: 63 71    Temp: 97.3 F (36.3 C) 97.2 F (36.2 C)    TempSrc: Oral Axillary    Resp: 18 18    Height:      Weight:   112.265 kg (247 lb 8 oz)   SpO2: 99% 98%  98%     Exam:  General: Alert, awake, oriented x1, in no acute distress.  HEENT: No bruits, no goiter. +JVD Heart: Regular rate and rhythm, without murmurs, rubs, gallops.  Lungs: Good air movement, wheezing B/L Abdomen: Soft, nontender, nondistended, positive bowel sounds.     Data Reviewed: Basic  Metabolic Panel:  Recent Labs Lab 10/02/13 0905 10/02/13 1149 10/02/13 2138 10/03/13 0545 10/04/13 1522 10/05/13 0507  NA 138  --  139 142 135* 138  K 5.9* 5.8* 4.8 4.6 3.9 4.6  CL 105  --  103 105 98 98  CO2 20  --  19 19 24 24   GLUCOSE 413*  --  505* 121* 320* 301*  BUN 74*  --  72* 74* 78* 80*  CREATININE 2.33*  --  2.23* 2.46* 2.60* 2.62*  CALCIUM 8.9  --  8.8 9.1 8.1* 8.3*   Liver Function Tests:  Recent Labs Lab 10/02/13 0905  AST 22  ALT 29  ALKPHOS 98  BILITOT 0.4  PROT 7.4  ALBUMIN 3.1*   No results found for this basename: LIPASE, AMYLASE,  in the last  168 hours No results found for this basename: AMMONIA,  in the last 168 hours CBC:  Recent Labs Lab 10/02/13 0905 10/03/13 0545  WBC 8.9 8.3  NEUTROABS 6.8  --   HGB 10.9* 10.4*  HCT 34.6* 33.1*  MCV 94.0 93.5  PLT 105* 119*   Cardiac Enzymes:  Recent Labs Lab 10/02/13 0905 10/02/13 2138 10/02/13 2320 10/03/13 0600  TROPONINI <0.30 <0.30 <0.30 <0.30   BNP (last 3 results)  Recent Labs  07/01/13 0552 07/03/13 0457 10/02/13 0905  PROBNP 4028.0* 3715.0* 6285.0*   CBG:  Recent Labs Lab 10/04/13 2306 10/05/13 0014 10/05/13 0030 10/05/13 0455 10/05/13 0757  GLUCAP 222* 275* 237* 305* 275*    Recent Results (from the past 240 hour(s))  MRSA PCR SCREENING     Status: None   Collection Time    10/02/13  3:46 PM      Result Value Ref Range Status   MRSA by PCR NEGATIVE  NEGATIVE Final   Comment:            The GeneXpert MRSA Assay (FDA     approved for NASAL specimens     only), is one component of a     comprehensive MRSA colonization     surveillance program. It is not     intended to diagnose MRSA     infection nor to guide or     monitor treatment for     MRSA infections.     Studies: Dg Chest Port 1 View  10/04/2013   CLINICAL DATA:  Dyspnea.  Worsening CHF.  Cough.  EXAM: PORTABLE CHEST - 1 VIEW  COMPARISON:  DG CHEST 1 VIEW dated 10/02/2013  FINDINGS: Shallow inspiration. Cardiac enlargement with mild pulmonary vascular congestion. Residual but improving infiltration in the right lung base.  IMPRESSION: Cardiac enlargement with pulmonary vascular congestion. No edema. Improving infiltrates in the bases with some residual on the right.   Electronically Signed   By: Lucienne Capers M.D.   On: 10/04/2013 06:28    Scheduled Meds: . ampicillin-sulbactam (UNASYN) IV  1.5 g Intravenous Q12H  . apixaban  5 mg Oral BID  . carvedilol  6.25 mg Oral BID WC  . vitamin B-12  500 mcg Oral Daily  . diltiazem  30 mg Oral 4 times per day  . ferrous sulfate  325 mg  Oral BID  . furosemide  80 mg Intravenous Q8H  . insulin aspart  0-9 Units Subcutaneous 6 times per day  . insulin glargine  30 Units Subcutaneous QHS  . isosorbide mononitrate  30 mg Oral Daily  . levalbuterol  0.63 mg Nebulization QID  . nystatin   Topical TID  . [  START ON 10/06/2013] predniSONE  20 mg Oral Q breakfast  . pregabalin  150 mg Oral BID  . Vitamin D (Ergocalciferol)  50,000 Units Oral Q Thu   Continuous Infusions:     [facility] Pager [phone] If 8PM-8AM, please contact night-coverage at [email or web address], password [email or web address] 10/05/2013, 10:00 AM  LOS: 3 days

## 2013-10-05 NOTE — Procedures (Signed)
Objective Swallowing Evaluation: Modified Barium Swallowing Study  Patient Details  Name: [name] MRN: [id number] Date of Birth: [birth year]  Today's Date: 10/05/2013 Time: 1230-1245 SLP Time Calculation (min): 15 min  Past Medical History:  Past Medical History  Diagnosis Date  . Diabetes mellitus   . Hypertension   . Hyperlipemia   . Fibromyalgia   . Obesity   . Sleep apnea     No CPAP  . Anxiety   . Hiatal hernia   . NSTEMI (non-ST elevated myocardial infarction) 04/27/2011  . Anemia 04/27/2011  . Restless leg syndrome   . CAD (coronary artery disease)     small dominant right coronary artery with diffuse 95% stenosis. The LAD had 70% stenosis in the mid vessel beyond the diagonal branch but was a small vessel. Circumflex had proximal 30% stenosis.   Marland Kitchen External hemorrhoids   . Arrhythmia 10/11/2011  . Community acquired pneumonia 06/24/2012  . Renal insufficiency 05/06/2011  . Shingles   . CHF exacerbation 10/02/2013   Past Surgical History:  Past Surgical History  Procedure Laterality Date  . Appendectomy    . Fracture surgery      left humerous  . Ganglion cyst removal     HPI:  73 y.o. F w/ history of chronic diastolic CHF, uncontrolled diabetes mellitus, hypertension, sleep apnea, hypertension, CAD history of MI in the past, and anemia who presented w/ complaints of worsening shortness of breath x1 week.  EKG showed atrial fibrillation at 94, but followup tele monitoring she was found to be in atrial fibrillation with rapid ventricular rate response and started on Cardizem drip. Patient's blood glucose was greater than 400, potassium 5.9, CO2 of 20. She was given insulin, Kayexalate and admitted for further evaluation and management.  MBS indicated following results of BSE completed on 10/04/13.    Assessment / Plan / Recommendation Clinical Impression  Dysphagia Diagnosis: Mild oral phase dysphagia;Mild pharyngeal phase dysphagia;Mild cervical esophageal phase  dysphagia Mild sensory oropharyngeal dysphagia.    Pharyngeal phase marked by decreased sensory resulting in delay in initiation of swallow with all consistencies.  Prespill to valleculae space and  pyriforms prior to initiating swallow with thin liquid by spoon and cup, puree and soft solids.  Swallow initiated at pyriforms with nectar thick liquids by cup with silent penetration during swallow.  No further instance of penetration or evidence of aspiration.    Esophageal screen indicates prominent CP muscle.  Slow bolus transit noted with mechanical soft consistency.   One instance of coughing at end of evaluation but cannot determine backflow from cervical esophagus as fluoroscopy not on.  Recommend dysphagia 3 diet consistency and thin liquids with aspiration precautions.  ST to continue in acute care setting for diet tolerance.  Recommend continued Skilled ST at next level of care.  Repeat MBS not indicated at this time.         Diet Recommendation Dysphagia 3 (Mechanical Soft);Thin liquid   Liquid Administration via: Cup;Straw Medication Administration: Whole meds with puree Supervision: Patient able to self feed;Staff to assist with self feeding;Full supervision/cueing for compensatory strategies Compensations: Slow rate;Small sips/bites Postural Changes and/or Swallow Maneuvers: Out of bed for meals;Seated upright 90 degrees;Upright 30-60 min after meal    Other  Recommendations Oral Care Recommendations: Oral care Q4 per protocol   Follow Up Recommendations  Skilled Nursing facility    Frequency and Duration min 2x/week  2 weeks       SLP Swallow Goals  General Date of Onset: 10/02/13 HPI: 73 y.o. F w/ history of chronic diastolic CHF, uncontrolled diabetes mellitus, hypertension, sleep apnea, hypertension, CAD history of MI in the past, and anemia who presented w/ complaints of worsening shortness of breath x1 week. She also reported chest pain described as a tightness,  intermittent up to 10 out of 10 in intensity sometimes. Patient admitted to leg swelling over the past several weeks. She was seen in the ED and imaging studies were negative for any fractures. CT of head showed no acute findings. BNP was elevated at 6285, chest x-ray showed improvement in central venous pulmonary congestion. She was treated with IV Lasix, steroids and nebs. Initial EKG showed atrial fibrillation at 94, but followup tele monitoring she was found to be in atrial fibrillation with rapid ventricular rate response and started on Cardizem drip. Patient's blood glucose was greater than 400, potassium 5.9, CO2 of 20. She was given insulin, Kayexalate and admitted for further evaluation and management.  Type of Study: Modified Barium Swallowing Study Reason for Referral: Objectively evaluate swallowing function Previous Swallow Assessment: BSE 10/04/13 NPO  Diet Prior to this Study: NPO Temperature Spikes Noted: No Respiratory Status: Nasal cannula History of Recent Intubation: No Behavior/Cognition: Alert;Cooperative;Pleasant mood Oral Cavity - Dentition: Edentulous Oral Motor / Sensory Function: Impaired - see Bedside swallow eval Self-Feeding Abilities: Able to feed self;Needs assist Patient Positioning: Upright in chair Baseline Vocal Quality: Hoarse;Low vocal intensity Volitional Cough: Strong Volitional Swallow: Able to elicit Anatomy: Within functional limits Pharyngeal Secretions: Not observed secondary MBS    Reason for Referral Objectively evaluate swallowing function   Oral Phase Oral Preparation/Oral Phase Oral Phase: Impaired Oral - Nectar Oral - Nectar Teaspoon: Incomplete tongue to palate contact;Piecemeal swallowing;Weak lingual manipulation Oral - Nectar Cup: Incomplete tongue to palate contact;Piecemeal swallowing;Weak lingual manipulation Oral - Thin Oral - Thin Teaspoon: Incomplete tongue to palate contact;Piecemeal swallowing;Weak lingual manipulation Oral -  Thin Cup: Incomplete tongue to palate contact;Piecemeal swallowing Oral - Thin Straw: Incomplete tongue to palate contact;Piecemeal swallowing;Weak lingual manipulation Oral - Solids Oral - Mechanical Soft: Piecemeal swallowing;Incomplete tongue to palate contact;Reduced posterior propulsion;Weak lingual manipulation   Pharyngeal Phase Pharyngeal Phase Pharyngeal Phase: Impaired Pharyngeal - Nectar Pharyngeal - Nectar Teaspoon: Delayed swallow initiation;Premature spillage to pyriform sinuses;Reduced airway/laryngeal closure;Reduced tongue base retraction Pharyngeal - Nectar Cup: Reduced tongue base retraction;Penetration/Aspiration during swallow;Penetration/Aspiration before swallow;Premature spillage to pyriform sinuses;Reduced airway/laryngeal closure Penetration/Aspiration details (nectar cup): Material enters airway, CONTACTS cords and not ejected out;Material enters airway, remains ABOVE vocal cords and not ejected out Pharyngeal - Thin Pharyngeal - Thin Teaspoon: Delayed swallow initiation;Premature spillage to valleculae;Premature spillage to pyriform sinuses;Reduced tongue base retraction;Reduced airway/laryngeal closure Pharyngeal - Thin Cup: Reduced tongue base retraction;Premature spillage to valleculae;Premature spillage to pyriform sinuses;Reduced airway/laryngeal closure Pharyngeal - Thin Straw: Reduced tongue base retraction;Premature spillage to pyriform sinuses;Reduced airway/laryngeal closure Pharyngeal - Solids Pharyngeal - Mechanical Soft: Reduced tongue base retraction;Premature spillage to pyriform sinuses;Reduced airway/laryngeal closure  Cervical Esophageal Phase    GO    Cervical Esophageal Phase Cervical Esophageal Phase: Impaired Cervical Esophageal Phase - Solids Mechanical Soft: Reduced cricopharyngeal relaxation Pill: Reduced cricopharyngeal relaxation        Sharman Crate [place], [place] Buffy 10/05/2013, 3:08 PM

## 2013-10-06 LAB — GLUCOSE, CAPILLARY
GLUCOSE-CAPILLARY: 310 mg/dL — AB (ref 70–99)
GLUCOSE-CAPILLARY: 219 mg/dL — AB (ref 70–99)
GLUCOSE-CAPILLARY: 470 mg/dL — AB (ref 70–99)
Glucose-Capillary: 311 mg/dL — ABNORMAL HIGH (ref 70–99)
Glucose-Capillary: 366 mg/dL — ABNORMAL HIGH (ref 70–99)
Glucose-Capillary: 383 mg/dL — ABNORMAL HIGH (ref 70–99)

## 2013-10-06 LAB — BASIC METABOLIC PANEL
BUN: 88 mg/dL — ABNORMAL HIGH (ref 6–23)
CALCIUM: 8.2 mg/dL — AB (ref 8.4–10.5)
CO2: 25 mEq/L (ref 19–32)
CREATININE: 2.66 mg/dL — AB (ref 0.50–1.10)
Chloride: 99 mEq/L (ref 96–112)
GFR, EST AFRICAN AMERICAN: 20 mL/min — AB (ref >90)
GFR, EST NON AFRICAN AMERICAN: 17 mL/min — AB (ref >90)
Glucose, Bld: 280 mg/dL — ABNORMAL HIGH (ref 70–99)
Potassium: 4.5 mEq/L (ref 3.7–5.3)
SODIUM: 139 meq/L (ref 137–147)

## 2013-10-06 LAB — PROTIME-INR
INR: 1.63 — AB (ref 0.00–1.49)
PROTHROMBIN TIME: 18.9 s — AB (ref 11.6–15.2)

## 2013-10-06 MED ORDER — INSULIN ASPART 100 UNIT/ML ~~LOC~~ SOLN
8.0000 [IU] | Freq: Once | SUBCUTANEOUS | Status: AC
  Administered 2013-10-06: 8 [IU] via SUBCUTANEOUS

## 2013-10-06 MED ORDER — COUMADIN BOOK
Freq: Once | Status: AC
  Administered 2013-10-06: 16:00:00
  Filled 2013-10-06: qty 1

## 2013-10-06 MED ORDER — WARFARIN VIDEO: Freq: Once | Status: DC

## 2013-10-06 MED ORDER — WARFARIN SODIUM 5 MG PO TABS
5.0000 mg | ORAL_TABLET | Freq: Once | ORAL | Status: AC
  Administered 2013-10-06: 5 mg via ORAL
  Filled 2013-10-06: qty 1

## 2013-10-06 MED ORDER — INSULIN GLARGINE 100 UNIT/ML ~~LOC~~ SOLN
45.0000 [IU] | Freq: Two times a day (BID) | SUBCUTANEOUS | Status: DC
  Administered 2013-10-06 – 2013-10-07 (×2): 45 [IU] via SUBCUTANEOUS
  Filled 2013-10-06 (×3): qty 0.45

## 2013-10-06 MED ORDER — INSULIN ASPART 100 UNIT/ML ~~LOC~~ SOLN
0.0000 [IU] | Freq: Three times a day (TID) | SUBCUTANEOUS | Status: DC
  Administered 2013-10-07 (×2): 5 [IU] via SUBCUTANEOUS

## 2013-10-06 MED ORDER — WARFARIN - PHARMACIST DOSING INPATIENT: Freq: Every day | Status: DC

## 2013-10-06 NOTE — Progress Notes (Signed)
UR completed Rowen Hur K. Naomi Castrogiovanni, RN, BSN, [place], CCM  10/06/2013 6:23 PM

## 2013-10-06 NOTE — Progress Notes (Signed)
Physical Therapy Treatment Patient Details Name: [name] MRN: [id number] DOB: Oct 26, 1940 Today's Date: 10/06/2013    History of Present Illness Patient is a 73 yo female admitted with CHF/SOB, Afib with RVR, fall, chest pain, hyperkalemia, uncontrolled DM.    PT Comments    Pt progressing towards physical therapy goals. Was able to ambulate 50 feet with RW and 3L/min supplemental O2 donned, with sats remaining in the mid-90's. Pt eager to participate with PT today. Will continue to progress per POC.   Follow Up Recommendations  Home health PT;Supervision for mobility/OOB;Supervision - Intermittent     Equipment Recommendations  None recommended by PT    Recommendations for Other Services       Precautions / Restrictions Precautions Precautions: Fall Restrictions Weight Bearing Restrictions: No    Mobility  Bed Mobility               General bed mobility comments: Pt sitting in recliner upon PT arrival.   Transfers Overall transfer level: Needs assistance Equipment used: Rolling walker (2 wheeled) Transfers: Sit to/from Stand Sit to Stand: Min assist Stand pivot transfers: Min assist       General transfer comment: Assist to power-up to full standing. Once standing, pt initially had a brief LOB posteriorly, and therapist had pt sit and attempt transfer again. No assist required for balance on second attempt, however continued to provide minimal assistance to reach full standing.   Ambulation/Gait Ambulation/Gait assistance: Min guard Ambulation Distance (Feet): 50 Feet Assistive device: Rolling walker (2 wheeled) Gait Pattern/deviations: Step-through pattern;Decreased stride length;Trunk flexed Gait velocity: Decreased Gait velocity interpretation: Below normal speed for age/gender General Gait Details: VC's for pursed-lip breathing as well as for sequencing and technique. Pt on 3L/min supplemental O2 throughout session with sats remaining at 94% with  ambulation.    Stairs            Wheelchair Mobility    Modified Rankin (Stroke Patients Only)       Balance Overall balance assessment: Needs assistance Sitting-balance support: Feet supported;No upper extremity supported Sitting balance-Leahy Scale: Good     Standing balance support: Bilateral upper extremity supported Standing balance-Leahy Scale: Fair                      Cognition Arousal/Alertness: Awake/alert Behavior During Therapy: WFL for tasks assessed/performed Overall Cognitive Status: Within Functional Limits for tasks assessed                      Exercises General Exercises - Lower Extremity Ankle Circles/Pumps: 10 reps Quad Sets: 10 reps Long Arc Quad: 10 reps Heel Slides: 10 reps Hip ABduction/ADduction: 10 reps    General Comments        Pertinent Vitals/Pain See above for O2 sats.     Home Living Family/patient expects to be discharged to:: Private residence Living Arrangements: Children Available Help at Discharge: Family;Available PRN/intermittently Type of Home: House Home Access: Ramped entrance   Home Layout: One level Home Equipment: Walker - 2 wheels;Wheelchair - manual;Cane - single point;Shower seat;Bedside commode Additional Comments: patient has shower chair, RW, BSC at home     Prior Function Level of Independence: Needs assistance  Gait / Transfers Assistance Needed: Supervision with ambulation with RW. ADL's / Homemaking Assistance Needed: Assist for meal prep (can prep light meal snack), housekeeping. Independent with ADLs, assist for donning socks and shoes Comments: Pt states she will go out to [employer] and will [place] a  cart as long as possible and then she will go to a motorized chair.   PT Goals (current goals can now be found in the care plan section) Acute Rehab PT Goals Patient Stated Goal: To get stronger and maybe go home tomorrow PT Goal Formulation: With patient Time For Goal Achievement:  10/10/13 Potential to Achieve Goals: Fair Progress towards PT goals: Progressing toward goals    Frequency  Min 3X/week    PT Plan Current plan remains appropriate    Co-evaluation             End of Session Equipment Utilized During Treatment: Gait belt;Oxygen Activity Tolerance: Patient tolerated treatment well Patient left: in chair;with call bell/phone within reach     Time: 1154-1218 PT Time Calculation (min): 24 min  Charges:  $Gait Training: 8-22 mins $Therapeutic Exercise: 8-22 mins                    G Codes:      Jolyn Lent Oct 26, 2013, 1:28 PM  Jolyn Lent, PT, DPT Acute Rehabilitation Services Pager: [phone]

## 2013-10-06 NOTE — Evaluation (Signed)
Occupational Therapy Evaluation Patient Details Name: [name] MRN: [id number] DOB: Dec 06, 1940 Today's Date: 10/06/2013    History of Present Illness Patient is a 73 yo female admitted with CHF/SOB, Afib with RVR, fall, chest pain, hyperkalemia, uncontrolled DM.   Clinical Impression   Pt demonstrates decline in function and safety with ADLs and ADL mobility with decreased strength, balance and endurance. Pt would benefit from acute OT services to address impairments to increase level of function and safety    Follow Up Recommendations  No OT follow up;Supervision - Intermittent    Equipment Recommendations  None recommended by OT    Recommendations for Other Services       Precautions / Restrictions Precautions Precautions: Fall Restrictions Weight Bearing Restrictions: No      Mobility Bed Mobility               General bed mobility comments: pt up in recline rupon entering room  Transfers Overall transfer level: Needs assistance   Transfers: Sit to/from Stand;Stand Pivot Transfers Sit to Stand: Min assist Stand pivot transfers: Min assist       General transfer comment: from recliner to [place] Overall balance assessment: Needs assistance Sitting-balance support: No upper extremity supported;Feet supported Sitting balance-Leahy Scale: Good     Standing balance support: Single extremity supported;Bilateral upper extremity supported;During functional activity Standing balance-Leahy Scale: Fair                              ADL Overall ADL's : Needs assistance/impaired     Grooming: Wash/dry hands;Wash/dry face;Sitting;Set up   Upper Body Bathing: Set up;Sitting   Lower Body Bathing: Minimal assistance;Sitting/lateral leans;Sit to/from stand   Upper Body Dressing : Set up;Sitting   Lower Body Dressing: Moderate assistance;Minimal assistance;Sit to/from stand Lower Body Dressing Details (indicate cue type and reason): max A  to donn socks Toilet Transfer: Minimal assistance;BSC;Stand-pivot   Toileting- Clothing Manipulation and Hygiene: Minimal assistance;Sit to/from stand       Functional mobility during ADLs: Minimal assistance       Vision  wears glasses at all times                              Pertinent Vitals/Pain 3/10 pain in chest with coughing, VSS     Hand Dominance Right   Extremity/Trunk Assessment Upper Extremity Assessment Upper Extremity Assessment: Generalized weakness   Lower Extremity Assessment Lower Extremity Assessment: Defer to PT evaluation   Cervical / Trunk Assessment Cervical / Trunk Assessment: Normal   Communication Communication Communication: No difficulties   Cognition Arousal/Alertness: Awake/alert Behavior During Therapy: WFL for tasks assessed/performed Overall Cognitive Status: Within Functional Limits for tasks assessed                     General Comments   Pt pleasant and cooperative                 Home Living Family/patient expects to be discharged to:: Private residence Living Arrangements: Children Available Help at Discharge: Family;Available PRN/intermittently Type of Home: House Home Access: Ramped entrance     Home Layout: One level     Bathroom Shower/Tub: Walk-in shower;Door   [employer] Toilet: Standard Bathroom Accessibility: Yes   Home Equipment: Environmental consultant - 2 wheels;Wheelchair - manual;Cane - single point;Shower seat;Bedside commode   Additional Comments: patient has shower chair, RW,  BSC at home       Prior Functioning/Environment Level of Independence: Needs assistance  Gait / Transfers Assistance Needed: Supervision with ambulation with RW. ADL's / Homemaking Assistance Needed: Assist for meal prep (can prep light meal snack), housekeeping. Independent with ADLs, assist for donning socks and shoes   Comments: Pt states she will go out to [employer] and will [place] a cart as long as possible and then she  will go to a motorized chair.    OT Diagnosis: Generalized weakness   OT Problem List: Impaired balance (sitting and/or standing);Decreased activity tolerance   OT Treatment/Interventions: Self-care/ADL training;Therapeutic exercise;Patient/family education;Neuromuscular education;Balance training;Therapeutic activities;DME and/or AE instruction    OT Goals(Current goals can be found in the care plan section) Acute Rehab OT Goals Patient Stated Goal: To get stronger and maybe go home tomorrow OT Goal Formulation: With patient Time For Goal Achievement: 10/13/13 Potential to Achieve Goals: Good ADL Goals Pt Will Perform Grooming: with min assist;with min guard assist;standing Pt Will Perform Lower Body Bathing: with min guard assist;with supervision;with set-up;sitting/lateral leans;sit to/from stand Pt Will Perform Lower Body Dressing: with min assist;with min guard assist;sit to/from stand;sitting/lateral leans Pt Will Transfer to Toilet: with min guard assist;with supervision;bedside commode;ambulating;regular height toilet;grab bars Pt Will Perform Toileting - Clothing Manipulation and hygiene: with min guard assist;with supervision  OT Frequency: Min 2X/week   Barriers to D/C:    none                     End of Session Equipment Utilized During Treatment: Gait belt;Other (comment) (BSC)  Activity Tolerance: Patient tolerated treatment well Patient left: in chair;with call bell/phone within reach   Time: 1035-1054 OT Time Calculation (min): 19 min Charges:  OT General Charges $OT Visit: 1 Procedure OT Evaluation $Initial OT Evaluation Tier I: 1 Procedure OT Treatments $Therapeutic Activity: 8-22 mins G-Codes:    Mosetta Putt Oct 07, 2013, 1:04 PM

## 2013-10-06 NOTE — Care Management Note (Addendum)
  Page 2 of 2   10/07/2013     4:28:37 PM CARE MANAGEMENT NOTE 10/07/2013  Patient:  [name], [name]   Account Number:  [id number]  Date Initiated:  10/06/2013  Documentation initiated by:  Albie Bazin  Subjective/Objective Assessment:   Admitted with SOB, Afib, HF     Action/Plan:   CM to follow for disposition needs   Anticipated DC Date:  10/09/2013   Anticipated DC Plan:  [place]  CM consult      [facility] Choice  HOME HEALTH   Choice offered to / List presented to:  C-1 Patient        [place] arranged  HH-1 RN  [place].   Status of service:  Completed, signed off Medicare Important Message given?  YES (If response is "NO", the following Medicare IM given date fields will be blank) Date Medicare IM given:  10/06/2013 Date Additional Medicare IM given:    Discharge Disposition:  [place]  Per UR Regulation:  Reviewed for med. necessity/level of care/duration of stay  If discussed at [place] of Stay Meetings, dates discussed:   10/07/2013    Comments:  Mariann Laster RN, BSN, MSHL, CCM  Nurse - Case Manager, ([facility])  [phone]  10/06/2013 SATURATION QUALIFICATIONS: (This note is used to comply with regulatory documentation for home oxygen) Patient Saturations on Room Air at Rest = 95% Patient Saturations on Room Air while Ambulating = 80% Patient Saturations on 3 Liters of oxygen while Ambulating = 91% Please briefly explain why patient needs home oxygen: Pt very SOB while ambulating, Oxygen level dropped to 80%. Disposition Plan:  Home with HHS:  RN, PT, CNA  *Wound mgmt with [facility]   ([facility] notified) Home Oxygen ordered with [facility]/Jermaine  Xylan Sheils RN, BSN, MSHL, CCM  Nurse - Case Manager, ([phone]  10/06/2013 Benefits check apixaban (ELIQUIS) tablet 5 mg 2x/day by mouth Please verify coverage, co-pays,  dectibles authorizations and preferred pharmacy.

## 2013-10-06 NOTE — Progress Notes (Signed)
ANTICOAGULATION CONSULT NOTE - Initial Consult  Pharmacy Consult for warfarin Indication: atrial fibrillation  Allergies  Allergen Reactions  . Codeine Other (See Comments)    Childhood allergy    Patient Measurements: Height: 5\' 3"  (160 cm) Weight: 245 lb 8 oz (111.358 kg) (c scale) IBW/kg (Calculated) : 52.4 Heparin Dosing Weight: NA  Vital Signs: Temp: 97 F (36.1 C) (05/04 0543) Temp src: Oral (05/04 0543) BP: 132/60 mmHg (05/04 1233) Pulse Rate: 66 (05/04 0543)  Labs:  Recent Labs  10/04/13 1522 10/05/13 0507 10/06/13 0533  LABPROT  --   --  18.9*  INR  --   --  1.63*  CREATININE 2.60* 2.62* 2.66*    Estimated Creatinine Clearance: 22.9 ml/min (by C-G formula based on Cr of 2.66).   Medical History: Past Medical History  Diagnosis Date  . Diabetes mellitus   . Hypertension   . Hyperlipemia   . Fibromyalgia   . Obesity   . Sleep apnea     No CPAP  . Anxiety   . Hiatal hernia   . NSTEMI (non-ST elevated myocardial infarction) 04/27/2011  . Anemia 04/27/2011  . Restless leg syndrome   . CAD (coronary artery disease)     small dominant right coronary artery with diffuse 95% stenosis. The LAD had 70% stenosis in the mid vessel beyond the diagonal branch but was a small vessel. Circumflex had proximal 30% stenosis.   Marland Kitchen External hemorrhoids   . Arrhythmia 10/11/2011  . Community acquired pneumonia 06/24/2012  . Renal insufficiency 05/06/2011  . Shingles   . CHF exacerbation 10/02/2013    Medications:  Prescriptions prior to admission  Medication Sig Dispense Refill  . carvedilol (COREG) 6.25 MG tablet Take 6.25 mg by mouth 2 (two) times daily with a meal.      . Cyanocobalamin 500 MCG SUBL Place 1 tablet under the tongue daily.      . ferrous sulfate 325 (65 FE) MG tablet Take 325 mg by mouth 2 (two) times daily.      . insulin aspart (NOVOLOG) 100 UNIT/ML injection Inject 0-9 Units into the skin 3 (three) times daily with meals.  10 mL  11  . Insulin  Glargine (LANTUS SOLOSTAR) 100 UNIT/ML Solostar Pen Inject 40 Units into the skin at bedtime.      . insulin lispro (HUMALOG KWIKPEN) 100 UNIT/ML KiwkPen Inject 10 Units into the skin 3 (three) times daily with meals.      . isosorbide mononitrate (IMDUR) 30 MG 24 hr tablet Take 30 mg by mouth daily.      . nitroGLYCERIN (NITROSTAT) 0.4 MG SL tablet Place 1 mg under the tongue every 5 (five) minutes x 3 doses as needed for chest pain. For chest pain      . potassium chloride SA (K-DUR,KLOR-CON) 20 MEQ tablet Take 20 mEq by mouth daily.      . pregabalin (LYRICA) 150 MG capsule Take 150 mg by mouth 2 (two) times daily.      Marland Kitchen senna-docusate (SENOKOT-S) 8.6-50 MG per tablet Take 1 tablet by mouth at bedtime as needed for mild constipation.  30 tablet  0  . torsemide (DEMADEX) 20 MG tablet Take 2 tablets (40 mg total) by mouth daily.  60 tablet  0  . Vitamin D, Ergocalciferol, (DRISDOL) 50000 UNITS CAPS capsule Take 50,000 Units by mouth every Thursday.        Assessment: 73 year old female on apixaban prior to admission for history of atrial fibrillation.  Currently transitioning to warfarin.  No bridging necessary.  INR on admission was 1.3, today it is 1.63.  INR possibly elevated due to effect from apixaban.  H/h stable, plts 119, no bleeding noted.  Goal of Therapy:  INR 2-3 Monitor platelets by anticoagulation protocol: Yes   Plan:  Warfarin 5 mg po x1  Daily INR   Hughes Better, PharmD, BCPS Clinical Pharmacist Pager: [phone] 10/06/2013 1:05 PM

## 2013-10-06 NOTE — Progress Notes (Signed)
Inpatient Diabetes Program Recommendations  AACE/ADA: New Consensus Statement on Inpatient Glycemic Control (2013)  Target Ranges:  Prepandial:   less than 140 mg/dL      Peak postprandial:   less than 180 mg/dL (1-2 hours)      Critically ill patients:  140 - 180 mg/dL   Reason for Visit: Hyperglycemia  Diabetes history: DM2 Outpatient Diabetes medications: Lantus 40 units QHS and Novolog 10 Current orders for Inpatient glycemic control: Novolog sensitive Q4H and Lantus 45 units bid  Inpatient Diabetes Program Recommendations Insulin - Basal: Decrease Lantus to 50 units QHS Insulin - Meal Coverage: Add meal coverage insulin - Novolog 6 units tidwc if pt eats >50% meal  Note: Will continue to follow. Thank you. Lorenda Peck, RD, LDN, CDE Inpatient Diabetes Coordinator [phone]

## 2013-10-06 NOTE — Progress Notes (Signed)
[facility] PROGRESS NOTE Interim History: 73 y.o. F w/ history of chronic diastolic CHF, uncontrolled diabetes mellitus, hypertension, sleep apnea, hypertension, CAD history of MI in the past, and anemia who presented w/ complaints of worsening shortness of breath x1 week. She also reported chest pain described as a tightness, intermittent up to 10 out of 10 in intensity sometimes. Patient admitted to leg swelling over the past several weeks. She was seen in the ED and imaging studies were negative for any fractures. CT of head showed no acute findings. BNP was elevated at 6285, chest x-ray showed improvement in central venous pulmonary congestion. She was treated with IV Lasix, steroids and nebs. Initial EKG showed atrial fibrillation at 94, but followup tele monitoring she was found to be in atrial fibrillation with rapid ventricular rate response and started on Cardizem drip. Patient's blood glucose was greater than 400, potassium 5.9, CO2 of 20. She was given insulin, Kayexalate and admitted for further evaluation and management.   Filed Weights   10/04/13 0342 10/05/13 0500 10/06/13 0446  Weight: 113.9 kg (251 lb 1.7 oz) 112.265 kg (247 lb 8 oz) 111.358 kg (245 lb 8 oz)        Intake/Output Summary (Last 24 hours) at 10/06/13 5701 Last data filed at 10/06/13 0900  Gross per 24 hour  Intake    800 ml  Output   1025 ml  Net   -225 ml     Assessment/Plan: Acute hypoxic respiratory failure multifactorial due to  Afib w/ acute RVR and acute diastolic heart failure: - None Compliance w/ medications has been questioned in the past - rate controlled change to oral diltiazem - Cont coreg. - Change steroids to oral. - FEES pending.  Acute exacerbation of chronic diastolic CHF:  - TTE 7/79/3903 w/ EF 60-65%, weight 112.2 kg - Cont IV lasix, strict I and O's replete electrolytes. - cont to diurese, cr. Stable.  Acute confusional state/Delirium: - haldol PRN for  agitation  Hyperkalemia: - K+ 5.9 at presentation  - resolved   DM  - Change lantus to BID. - A1c 9.9 - follow.  HTN: - Improve with diuresis.Marland Kitchen - cont home meds.  CKD stage IV  crt 2.56 Nov 2014 - renal fxn appears stable at this time.  Hx of CVA   HLD  Continue prescribed home medication   Obesity  - Body mass index is 45.47 kg/(m^2).   CAD  Medically managed - stenosis of RCA per cardiac cath in 2012 too small for intervention      Code Status: FULL  Family Communication: no family present at time of exam  Disposition Plan: inpatient  Consultants:  none  Procedures: ECHO: none  Antibiotics:  None  HPI/Subjective: No complains Objective: Filed Vitals:   10/05/13 1418 10/05/13 2022 10/06/13 0446 10/06/13 0543  BP: 131/59 107/49  136/59  Pulse: 64 87  66  Temp: 97.9 F (36.6 C) 97.6 F (36.4 C)  97 F (36.1 C)  TempSrc: Oral Oral  Oral  Resp: 18 20  20   Height:      Weight:   111.358 kg (245 lb 8 oz)   SpO2: 99% 97%  100%     Exam:  General: Alert, awake, oriented x1, in no acute distress.  HEENT: No bruits, no goiter. +JVD Heart: Regular rate and rhythm, without murmurs, rubs, gallops.  Lungs: Good air movement, wheezing B/L Abdomen: Soft, nontender, nondistended, positive bowel sounds.     Data Reviewed: Basic Metabolic Panel:  Recent Labs Lab 10/02/13 2138 10/03/13 0545 10/04/13 1522 2013-10-29 0507 10/06/13 0533  NA 139 142 135* 138 139  K 4.8 4.6 3.9 4.6 4.5  CL 103 105 98 98 99  CO2 19 19 24 24 25   GLUCOSE 505* 121* 320* 301* 280*  BUN 72* 74* 78* 80* 88*  CREATININE 2.23* 2.46* 2.60* 2.62* 2.66*  CALCIUM 8.8 9.1 8.1* 8.3* 8.2*   Liver Function Tests:  Recent Labs Lab 10/02/13 0905  AST 22  ALT 29  ALKPHOS 98  BILITOT 0.4  PROT 7.4  ALBUMIN 3.1*   No results found for this basename: LIPASE, AMYLASE,  in the last 168 hours No results found for this basename: AMMONIA,  in the last 168 hours CBC:  Recent  Labs Lab 10/02/13 0905 10/03/13 0545  WBC 8.9 8.3  NEUTROABS 6.8  --   HGB 10.9* 10.4*  HCT 34.6* 33.1*  MCV 94.0 93.5  PLT 105* 119*   Cardiac Enzymes:  Recent Labs Lab 10/02/13 0905 10/02/13 2138 10/02/13 2320 10/03/13 0600  TROPONINI <0.30 <0.30 <0.30 <0.30   BNP (last 3 results)  Recent Labs  07/01/13 0552 07/03/13 0457 10/02/13 0905  PROBNP 4028.0* 3715.0* 6285.0*   CBG:  Recent Labs Lab Oct 29, 2013 2003 10/29/13 2302 10/06/13 0002 10/06/13 0433 10/06/13 0817  GLUCAP 536* 417* 366* 310* 219*    Recent Results (from the past 240 hour(s))  MRSA PCR SCREENING     Status: None   Collection Time    10/02/13  3:46 PM      Result Value Ref Range Status   MRSA by PCR NEGATIVE  NEGATIVE Final   Comment:            The GeneXpert MRSA Assay (FDA     approved for NASAL specimens     only), is one component of a     comprehensive MRSA colonization     surveillance program. It is not     intended to diagnose MRSA     infection nor to guide or     monitor treatment for     MRSA infections.     Studies: Dg Swallowing Function  29-Oct-2013   CLINICAL DATA:  73 year old female with possible aspiration. Dysphagia.  EXAM: MODIFIED BARIUM SWALLOW  TECHNIQUE: Different consistencies of barium were administered orally to the patient by the Speech Pathologist. Imaging of the pharynx was performed in the lateral projection.  FLUOROSCOPY TIME:  1 min 18 seconds  COMPARISON:  None.  FINDINGS: With thin preparation of barium, early pharyngeal spillover and laryngeal penetration noted without aspiration.  With thick preparation of barium, mild early pharyngeal spillover and laryngeal penetration noted without aspiration.  With solid and pill preparations of barium, normal oropharyngeal transport noted without aspiration.  Normal epiglottic inversion with all consistencies noted.  IMPRESSION: Early pharyngeal spillover and laryngeal penetration with thick and thin preparations of  barium. No evidence of aspiration.  Please refer to the Speech Pathologists report for complete details and recommendations.   Electronically Signed   By: Hassan Rowan M.D.   On: Oct 29, 2013 18:24    Scheduled Meds: . ampicillin-sulbactam (UNASYN) IV  1.5 g Intravenous Q12H  . carvedilol  6.25 mg Oral BID WC  . vitamin B-12  500 mcg Oral Daily  . diltiazem  30 mg Oral 4 times per day  . ferrous sulfate  325 mg Oral BID  . insulin aspart  0-9 Units Subcutaneous 6 times per day  . insulin glargine  45  Units Subcutaneous QHS  . isosorbide mononitrate  30 mg Oral Daily  . nystatin   Topical TID  . predniSONE  40 mg Oral Q breakfast  . pregabalin  75 mg Oral Daily  . Vitamin D (Ergocalciferol)  50,000 Units Oral Q Thu   Continuous Infusions:     [facility] Pager [phone] If 8PM-8AM, please contact night-coverage at [email or web address], password [facility] 10/06/2013, 9:58 AM  LOS: 4 days

## 2013-10-06 NOTE — Progress Notes (Signed)
Pt CBG 470 tonight. K Kirby notified. Ordered to give 8 units novolog tonight with scheduled dose of lantus. New order for CBG ACHS. Will continue to monitor. Ronnette Hila, RN

## 2013-10-07 LAB — PROTIME-INR
INR: 1.22 (ref 0.00–1.49)
Prothrombin Time: 15.1 seconds (ref 11.6–15.2)

## 2013-10-07 LAB — BASIC METABOLIC PANEL
BUN: 85 mg/dL — AB (ref 6–23)
CALCIUM: 8.3 mg/dL — AB (ref 8.4–10.5)
CO2: 25 mEq/L (ref 19–32)
CREATININE: 2.53 mg/dL — AB (ref 0.50–1.10)
Chloride: 97 mEq/L (ref 96–112)
GFR, EST AFRICAN AMERICAN: 21 mL/min — AB (ref >90)
GFR, EST NON AFRICAN AMERICAN: 18 mL/min — AB (ref >90)
Glucose, Bld: 260 mg/dL — ABNORMAL HIGH (ref 70–99)
Potassium: 4.1 mEq/L (ref 3.7–5.3)
Sodium: 137 mEq/L (ref 137–147)

## 2013-10-07 LAB — GLUCOSE, CAPILLARY
Glucose-Capillary: 279 mg/dL — ABNORMAL HIGH (ref 70–99)
Glucose-Capillary: 286 mg/dL — ABNORMAL HIGH (ref 70–99)

## 2013-10-07 MED ORDER — WARFARIN SODIUM 5 MG PO TABS
5.0000 mg | ORAL_TABLET | Freq: Once | ORAL | Status: DC
  Filled 2013-10-07: qty 1

## 2013-10-07 MED ORDER — PREDNISONE 10 MG PO TABS: ORAL_TABLET | ORAL | Status: DC

## 2013-10-07 MED ORDER — WARFARIN SODIUM 5 MG PO TABS: 5.0000 mg | ORAL_TABLET | Freq: Once | ORAL | Status: DC

## 2013-10-07 MED ORDER — DILTIAZEM HCL ER 60 MG PO CP12: 60.0000 mg | ORAL_CAPSULE | Freq: Two times a day (BID) | ORAL | Status: DC

## 2013-10-07 MED ORDER — AMOXICILLIN-POT CLAVULANATE 875-125 MG PO TABS: 1.0000 | ORAL_TABLET | Freq: Two times a day (BID) | ORAL | Status: DC

## 2013-10-07 MED ORDER — INSULIN GLARGINE 100 UNIT/ML ~~LOC~~ SOLN: 45.0000 [IU] | Freq: Two times a day (BID) | SUBCUTANEOUS | Status: DC

## 2013-10-07 NOTE — Progress Notes (Signed)
All d/c instructions explained and given to pt.  Pt stated understand when asked.  D/c'd off floor via w/c to awaiting transport.  Karie Kirks, Therapist, sports.

## 2013-10-07 NOTE — Progress Notes (Signed)
ANTICOAGULATION CONSULT NOTE - Initial Consult  Pharmacy Consult for warfarin Indication: atrial fibrillation  Allergies  Allergen Reactions  . Codeine Other (See Comments)    Childhood allergy    Patient Measurements: Height: 5\' 3"  (160 cm) Weight: 248 lb 7.3 oz (112.7 kg) IBW/kg (Calculated) : 52.4 Heparin Dosing Weight: NA  Vital Signs: Temp: 97.2 F (36.2 C) (05/05 0433) Temp src: Oral (05/05 0433) BP: 129/62 mmHg (05/05 0433) Pulse Rate: 64 (05/05 0433)  Labs:  Recent Labs  10/05/13 0507 10/06/13 0533 10/07/13 0600  LABPROT  --  18.9* 15.1  INR  --  1.63* 1.22  CREATININE 2.62* 2.66* 2.53*    Estimated Creatinine Clearance: 24.3 ml/min (by C-G formula based on Cr of 2.53).   Medical History: Past Medical History  Diagnosis Date  . Diabetes mellitus   . Hypertension   . Hyperlipemia   . Fibromyalgia   . Obesity   . Sleep apnea     No CPAP  . Anxiety   . Hiatal hernia   . NSTEMI (non-ST elevated myocardial infarction) 04/27/2011  . Anemia 04/27/2011  . Restless leg syndrome   . CAD (coronary artery disease)     small dominant right coronary artery with diffuse 95% stenosis. The LAD had 70% stenosis in the mid vessel beyond the diagonal branch but was a small vessel. Circumflex had proximal 30% stenosis.   Marland Kitchen External hemorrhoids   . Arrhythmia 10/11/2011  . Community acquired pneumonia 06/24/2012  . Renal insufficiency 05/06/2011  . Shingles   . CHF exacerbation 10/02/2013    Medications:  Prescriptions prior to admission  Medication Sig Dispense Refill  . carvedilol (COREG) 6.25 MG tablet Take 6.25 mg by mouth 2 (two) times daily with a meal.      . Cyanocobalamin 500 MCG SUBL Place 1 tablet under the tongue daily.      . ferrous sulfate 325 (65 FE) MG tablet Take 325 mg by mouth 2 (two) times daily.      . insulin aspart (NOVOLOG) 100 UNIT/ML injection Inject 0-9 Units into the skin 3 (three) times daily with meals.  10 mL  11  . Insulin Glargine  (LANTUS SOLOSTAR) 100 UNIT/ML Solostar Pen Inject 40 Units into the skin at bedtime.      . insulin lispro (HUMALOG KWIKPEN) 100 UNIT/ML KiwkPen Inject 10 Units into the skin 3 (three) times daily with meals.      . isosorbide mononitrate (IMDUR) 30 MG 24 hr tablet Take 30 mg by mouth daily.      . nitroGLYCERIN (NITROSTAT) 0.4 MG SL tablet Place 1 mg under the tongue every 5 (five) minutes x 3 doses as needed for chest pain. For chest pain      . potassium chloride SA (K-DUR,KLOR-CON) 20 MEQ tablet Take 20 mEq by mouth daily.      . pregabalin (LYRICA) 150 MG capsule Take 150 mg by mouth 2 (two) times daily.      Marland Kitchen senna-docusate (SENOKOT-S) 8.6-50 MG per tablet Take 1 tablet by mouth at bedtime as needed for mild constipation.  30 tablet  0  . torsemide (DEMADEX) 20 MG tablet Take 2 tablets (40 mg total) by mouth daily.  60 tablet  0  . Vitamin D, Ergocalciferol, (DRISDOL) 50000 UNITS CAPS capsule Take 50,000 Units by mouth every Thursday.        Assessment: 73 year old female on apixaban prior to admission for history of atrial fibrillation.  Currently transitioning to warfarin.  No  bridging necessary.  INR 1.22. CBC stable. No s/sx bleeding.  Goal of Therapy:  INR 2-3 Monitor platelets by anticoagulation protocol: Yes   Plan:  Warfarin 5 mg po x1  Daily INR   Hughes Better, PharmD, BCPS Clinical Pharmacist Pager: [phone] 10/07/2013 9:59 AM

## 2013-10-07 NOTE — Discharge Summary (Signed)
Physician Discharge Summary  [name] [id number] DOB: 1940-11-09 DOA: 10/02/2013  PCP: Sherrie Mustache, MD  Admit date: 10/02/2013 Discharge date: 10/07/2013  Time spent: 35 minutes  Recommendations for Outpatient Follow-up:  1. Follow up with coumadin clinic this week. Titrate coumadin as needed. 2. Follow up with Endocrinologist in 2 week. Follow up on CBG's and titrate insulin as tolerate it. 3. Follow up with heart failure clinic in 1 week. Follow on weight. Titrate meds as needed. BNP    Component Value Date/Time   PROBNP 6285.0* 10/02/2013 0905   Filed Weights   10/05/13 0500 10/06/13 0446 10/07/13 0433  Weight: 112.265 kg (247 lb 8 oz) 111.358 kg (245 lb 8 oz) 112.7 kg (248 lb 7.3 oz)     Discharge Diagnoses:  Active Problems:   CAD (coronary artery disease)   CKD (chronic kidney disease), stage IV   Hyperkalemia   DM type 2, uncontrolled, with neuropathy   Chest pain   Atrial fibrillation with RVR   Fall at home   Hyperglycemia   CHF, acute on chronic   Acute respiratory failure   Discharge Condition: Guarded  Diet recommendation: low sodium diet dysphagia 3    History of present illness:  73 y.o. female with past medical history significant for chronic diastolic CH, uncontrolled diabetes mellitus, hypertension, sleep apnea, hypertension, CAD history of MI in the past, anemia and other medical problems as listed below who presents with complaints of worsening shortness of breath x1 week. She also reports chest pain over the past one week described as a tightness, intermittent up to 10 out of 10 in intensity sometimes. Her daughter is at the bedside assisting with the history and states that she was living at home with her son. Patient admits to leg swelling over the past several weeks. She reports that she fell last PM as she was trying to get up from the commode. She denies any dizziness, nausea or vomiting and no diarrhea. She was seen in the ED and  imaging studies were negative for any fractures. CT of head showed no acute findings. BNP was elevated at 6285, chest x-ray showed improvement in central venous pulmonary congestion. She was treated with IV Lasix, steroids and nebs. Initial EKG showed atrial fibrillation at 94, but followup tele monitoring she was found to be in atrial fibrillation with rapid ventricular rate response and started on Cardizem drip. Patient's blood glucose was greater than 400, potassium 5.9, CO2 of 20. She was given insulin, Kayexalate and admitted for further evaluation and management   Hospital Course:  Acute hypoxic respiratory failure multifactorial due to Afib w/ acute RVR and acute diastolic heart failure and possible Aspiration PNA:  - None Compliance w/ medications has been questioned in the past  - Started on diltiazem ggt, once stable changed to orals. - Cont home dose of coreg.  - Was wheezing on admission and she was started on steroids, antibiotics and inhalers, then cganged to orals. - Speech was consult due to the high risk of aspiration and rec Dys 3 diet. - home with daily weight PT and Home health. - d/c apixiban and started coumadin as her GFR < 30 ml/min.  Acute exacerbation of chronic diastolic CHF:  - TTE 99991111 w/ EF 60-65%, discharge  weight 112.2 kg  - Started on IV lasix, with strict I and O's replete electrolytes as needed. - negative 5.8L. Will need follow up at the heart failure clinic in 1 week.  Acute confusional state/Delirium:  -  haldol PRN for agitation. - resolved.   DM  - Change lantus to BID. Will cont with meal coverage at home. - A1c 9.9. - follow up with endo as an outpatient.  HTN:  - Improve with diuresis.Marland Kitchen  - cont home meds.   CKD stage IV  crt 2.56 Nov 2014 - renal fxn appears stable at this time.   Hx of CVA  HLD  Continue prescribed home medication.  Obesity  - Body mass index is 45.47 kg/(m^2).   CAD  Medically managed - stenosis of RCA per  cardiac cath in 2012 too small for intervention      Procedures:  CXR  MBBS  Consultations:  none  Discharge Exam: Filed Vitals:   10/07/13 1215  BP: 134/58  Pulse:   Temp:   Resp:     General: A&O x3 Cardiovascular: irregular rate and rhythm. Respiratory: good air movement CTA B/L  Discharge Instructions      Discharge Orders   Future Appointments Provider Department Dept Phone   12/11/2013 3:30 PM Philmore Pali, NP [facility] [phone]   Future Orders Complete By Expires   Diet - low sodium heart healthy  As directed    Increase activity slowly  As directed        Medication List    STOP taking these medications       LANTUS SOLOSTAR 100 UNIT/ML Solostar Pen  Generic drug:  Insulin Glargine  Replaced by:  insulin glargine 100 UNIT/ML injection      TAKE these medications       amoxicillin-clavulanate 875-125 MG per tablet  Commonly known as:  AUGMENTIN  Take 1 tablet by mouth 2 (two) times daily.     carvedilol 6.25 MG tablet  Commonly known as:  COREG  Take 6.25 mg by mouth 2 (two) times daily with a meal.     Cyanocobalamin 500 MCG Subl  Place 1 tablet under the tongue daily.     diltiazem 60 MG 12 hr capsule  Commonly known as:  CARDIZEM SR  Take 1 capsule (60 mg total) by mouth 2 (two) times daily.     ferrous sulfate 325 (65 FE) MG tablet  Take 325 mg by mouth 2 (two) times daily.     HUMALOG KWIKPEN 100 UNIT/ML KiwkPen  Generic drug:  insulin lispro  Inject 10 Units into the skin 3 (three) times daily with meals.     insulin aspart 100 UNIT/ML injection  Commonly known as:  novoLOG  Inject 0-9 Units into the skin 3 (three) times daily with meals.     insulin glargine 100 UNIT/ML injection  Commonly known as:  LANTUS  Inject 0.45 mLs (45 Units total) into the skin 2 (two) times daily.     isosorbide mononitrate 30 MG 24 hr tablet  Commonly known as:  IMDUR  Take 30 mg by mouth daily.     nitroGLYCERIN 0.4  MG SL tablet  Commonly known as:  NITROSTAT  Place 1 mg under the tongue every 5 (five) minutes x 3 doses as needed for chest pain. For chest pain     potassium chloride SA 20 MEQ tablet  Commonly known as:  K-DUR,KLOR-CON  Take 20 mEq by mouth daily.     predniSONE 10 MG tablet  Commonly known as:  DELTASONE  Takes 6 tablets for 1 days, then 5 tablets for 1 days, then 4 tablets for 1 days, then 3 tablets for 1 days, then 2 tabs for  1 days, then 1 tab for 1 days, and then stop.     pregabalin 150 MG capsule  Commonly known as:  LYRICA  Take 150 mg by mouth 2 (two) times daily.     senna-docusate 8.6-50 MG per tablet  Commonly known as:  Senokot-S  Take 1 tablet by mouth at bedtime as needed for mild constipation.     torsemide 20 MG tablet  Commonly known as:  DEMADEX  Take 2 tablets (40 mg total) by mouth daily.     Vitamin D (Ergocalciferol) 50000 UNITS Caps capsule  Commonly known as:  DRISDOL  Take 50,000 Units by mouth every Thursday.     warfarin 5 MG tablet  Commonly known as:  COUMADIN  Take 1 tablet (5 mg total) by mouth one time only at 6 PM.       Allergies  Allergen Reactions  . Codeine Other (See Comments)    Childhood allergy   Follow-up Information   Follow up with [facility] In 3 days. (hospital follow up)    Contact information:   [address] [place] [address]       Follow up with Philemon Kingdom, MD In 1 week. (hospital follow up)    Specialty:  Internal Medicine   Contact information:   [address] [place] [address] [phone]        The results of significant diagnostics from this hospitalization (including imaging, microbiology, ancillary and laboratory) are listed below for reference.    Significant Diagnostic Studies: Dg Chest 1 View  10/02/2013   CLINICAL DATA:  Right shoulder bruising fall, right shoulder bruising  EXAM: CHEST - 1 VIEW  COMPARISON:  Chest radiograph  08/20/2013  FINDINGS: Stable enlarged cardiac silhouette. There is improvement in central venous pulmonary congestion pattern seen on prior. Bibasilar atelectasis appear  IMPRESSION: 1. Improvement in central venous pulmonary congestion. 2. Persistent cardiomegaly. 3. Bibasilar atelectasis.   Electronically Signed   By: Suzy Bouchard M.D.   On: 10/02/2013 10:22   Dg Shoulder Right  10/02/2013   CLINICAL DATA:  Right shoulder pain  EXAM: RIGHT SHOULDER - 2+ VIEW  COMPARISON:  None.  FINDINGS: There is no fracture or dislocation. There are mild degenerative changes of the acromioclavicular joint. Mild interstitial thickening involving the right lung.  IMPRESSION: No acute osseous injury of the right shoulder.   Electronically Signed   By: Kathreen Devoid   On: 10/02/2013 10:05   Ct Head Wo Contrast  10/02/2013   CLINICAL DATA:  No cervical spine fracture or traumatic subluxation. Similar appearance to priors.  EXAM: CT HEAD WITHOUT CONTRAST  CT CERVICAL SPINE WITHOUT CONTRAST  TECHNIQUE: Multidetector CT imaging of the head and cervical spine was performed following the standard protocol without intravenous contrast. Multiplanar CT image reconstructions of the cervical spine were also generated.  COMPARISON:  08/20/2013.  FINDINGS: CT HEAD FINDINGS  No evidence for acute infarction, hemorrhage, mass lesion, hydrocephalus, or extra-axial fluid. Mild atrophy. Chronic microvascular ischemic change. Remote left posterior temporal/occipital infarct. Vascular calcification. Calvarium intact. No sinus or mastoid disease.  CT CERVICAL SPINE FINDINGS  There is no visible cervical spine fracture, traumatic subluxation, prevertebral soft tissue swelling, or intraspinal hematoma. Carotid atherosclerosis. Facet arthropathy. Clear lung apices. No dominant neck mass.  IMPRESSION: Atrophy and small vessel disease similar to priors. Remote cerebral infarction. No skull fracture or intracranial hemorrhage.   Electronically Signed    By: Michae Kava.D.  On: 10/02/2013 10:35   Ct Cervical Spine Wo Contrast  10/02/2013   CLINICAL DATA:  No cervical spine fracture or traumatic subluxation. Similar appearance to priors.  EXAM: CT HEAD WITHOUT CONTRAST  CT CERVICAL SPINE WITHOUT CONTRAST  TECHNIQUE: Multidetector CT imaging of the head and cervical spine was performed following the standard protocol without intravenous contrast. Multiplanar CT image reconstructions of the cervical spine were also generated.  COMPARISON:  08/20/2013.  FINDINGS: CT HEAD FINDINGS  No evidence for acute infarction, hemorrhage, mass lesion, hydrocephalus, or extra-axial fluid. Mild atrophy. Chronic microvascular ischemic change. Remote left posterior temporal/occipital infarct. Vascular calcification. Calvarium intact. No sinus or mastoid disease.  CT CERVICAL SPINE FINDINGS  There is no visible cervical spine fracture, traumatic subluxation, prevertebral soft tissue swelling, or intraspinal hematoma. Carotid atherosclerosis. Facet arthropathy. Clear lung apices. No dominant neck mass.  IMPRESSION: Atrophy and small vessel disease similar to priors. Remote cerebral infarction. No skull fracture or intracranial hemorrhage.   Electronically Signed   By: Rolla Flatten M.D.   On: 10/02/2013 10:35   Dg Swallowing Function  10/05/2013   CLINICAL DATA:  73 year old female with possible aspiration. Dysphagia.  EXAM: MODIFIED BARIUM SWALLOW  TECHNIQUE: Different consistencies of barium were administered orally to the patient by the Speech Pathologist. Imaging of the pharynx was performed in the lateral projection.  FLUOROSCOPY TIME:  1 min 18 seconds  COMPARISON:  None.  FINDINGS: With thin preparation of barium, early pharyngeal spillover and laryngeal penetration noted without aspiration.  With thick preparation of barium, mild early pharyngeal spillover and laryngeal penetration noted without aspiration.  With solid and pill preparations of barium, normal  oropharyngeal transport noted without aspiration.  Normal epiglottic inversion with all consistencies noted.  IMPRESSION: Early pharyngeal spillover and laryngeal penetration with thick and thin preparations of barium. No evidence of aspiration.  Please refer to the Speech Pathologists report for complete details and recommendations.   Electronically Signed   By: Hassan Rowan M.D.   On: 10/05/2013 18:24   Dg Chest Port 1 View  10/04/2013   CLINICAL DATA:  Dyspnea.  Worsening CHF.  Cough.  EXAM: PORTABLE CHEST - 1 VIEW  COMPARISON:  DG CHEST 1 VIEW dated 10/02/2013  FINDINGS: Shallow inspiration. Cardiac enlargement with mild pulmonary vascular congestion. Residual but improving infiltration in the right lung base.  IMPRESSION: Cardiac enlargement with pulmonary vascular congestion. No edema. Improving infiltrates in the bases with some residual on the right.   Electronically Signed   By: Lucienne Capers M.D.   On: 10/04/2013 06:28   Dg Shoulder Left  10/02/2013   CLINICAL DATA:  Left shoulder pain.  EXAM: LEFT SHOULDER - 2+ VIEW  COMPARISON:  None.  FINDINGS: There is no fracture or dislocation. There are mild degenerative changes of the acromioclavicular joint.  IMPRESSION: No acute osseous injury of the left shoulder.   Electronically Signed   By: Kathreen Devoid   On: 10/02/2013 10:06    Microbiology: Recent Results (from the past 240 hour(s))  MRSA PCR SCREENING     Status: None   Collection Time    10/02/13  3:46 PM      Result Value Ref Range Status   MRSA by PCR NEGATIVE  NEGATIVE Final   Comment:            The GeneXpert MRSA Assay (FDA     approved for NASAL specimens     only), is one component of a     comprehensive MRSA  colonization     surveillance program. It is not     intended to diagnose MRSA     infection nor to guide or     monitor treatment for     MRSA infections.     Labs: Basic Metabolic Panel:  Recent Labs Lab 10/03/13 0545 10/04/13 1522 10/05/13 0507 10/06/13 0533  10/07/13 0600  NA 142 135* 138 139 137  K 4.6 3.9 4.6 4.5 4.1  CL 105 98 98 99 97  CO2 19 24 24 25 25   GLUCOSE 121* 320* 301* 280* 260*  BUN 74* 78* 80* 88* 85*  CREATININE 2.46* 2.60* 2.62* 2.66* 2.53*  CALCIUM 9.1 8.1* 8.3* 8.2* 8.3*   Liver Function Tests:  Recent Labs Lab 10/02/13 0905  AST 22  ALT 29  ALKPHOS 98  BILITOT 0.4  PROT 7.4  ALBUMIN 3.1*   No results found for this basename: LIPASE, AMYLASE,  in the last 168 hours No results found for this basename: AMMONIA,  in the last 168 hours CBC:  Recent Labs Lab 10/02/13 0905 10/03/13 0545  WBC 8.9 8.3  NEUTROABS 6.8  --   HGB 10.9* 10.4*  HCT 34.6* 33.1*  MCV 94.0 93.5  PLT 105* 119*   Cardiac Enzymes:  Recent Labs Lab 10/02/13 0905 10/02/13 2138 10/02/13 2320 10/03/13 0600  TROPONINI <0.30 <0.30 <0.30 <0.30   BNP: BNP (last 3 results)  Recent Labs  07/01/13 0552 07/03/13 0457 10/02/13 0905  PROBNP 4028.0* 3715.0* 6285.0*   CBG:  Recent Labs Lab 10/06/13 1124 10/06/13 1710 10/06/13 2017 10/07/13 0542 10/07/13 1058  GLUCAP 311* 383* 470* 279* 286*       Signed:  Charlynne Cousins  [facility] 10/07/2013, 12:27 PM

## 2013-10-07 NOTE — Progress Notes (Signed)
Informed by Lesleigh Noe that pt's MBS done past Saturday, and will have a bedside f/u this afternoon.  Informed her if she can have it done earlier today as Dr. Aileen Fass that he would like to d/c her today.  Margie informed that  She will get some one else to come.  Karie Kirks, Therapist, sports.

## 2013-10-07 NOTE — Progress Notes (Signed)
Occupational Therapy Treatment Patient Details Name: [name] MRN: [id number] DOB: 05-21-1941 Today's Date: 10/07/2013    History of present illness Patient is a 73 yo female admitted with CHF/SOB, Afib with RVR, fall, chest pain, hyperkalemia, uncontrolled DM.   OT comments  Pt making good progress with functional goals and should continue with acute OT services to increase level of function and safety  Follow Up Recommendations  No OT follow up;Supervision - Intermittent    Equipment Recommendations  None recommended by OT    Recommendations for Other Services      Precautions / Restrictions Precautions Precautions: Fall Restrictions Weight Bearing Restrictions: No       Mobility Bed Mobility               General bed mobility comments: Pt on BSC upon entering room  Transfers Overall transfer level: Needs assistance Equipment used: Rolling walker (2 wheeled)   Sit to Stand: Min guard              Balance Overall balance assessment: Needs assistance Sitting-balance support: No upper extremity supported;Feet supported Sitting balance-Leahy Scale: Good     Standing balance support: Single extremity supported;During functional activity Standing balance-Leahy Scale: Fair                     ADL       Grooming: Wash/dry hands;Wash/dry face;Set up;Standing;Supervision/safety   Upper Body Bathing: Set up;Supervision/ safety;Standing   Lower Body Bathing: Sit to/from stand;Supervison/ safety;Set up;Min guard   Upper Body Dressing : Set up;Standing;Supervision/safety   Lower Body Dressing: Minimal assistance;Sit to/from stand;Set up Lower Body Dressing Details (indicate cue type and reason): max A to donn socks Toilet Transfer: Min guard;BSC   Toileting- Clothing Manipulation and Hygiene: Min guard       Functional mobility during ADLs: Min guard General ADL Comments: Pt stood at sink for bathing and dresing this morning      Vision   wears glasses                              Cognition   Behavior During Therapy: [facility] for tasks assessed/performed Overall Cognitive Status: Within Functional Limits for tasks assessed                                                  General Comments  Pt very pleasant and cooperative    Pertinent Vitals/ Pain       No c/o pain, VSS                                                          Frequency Min 2X/week     Progress Toward Goals  OT Goals(current goals can now be found in the care plan section)  Progress towards OT goals: Progressing toward goals     Plan Discharge plan remains appropriate                     End of Session Equipment Utilized During Treatment: Rolling walker;Other (comment) (BSC)   Activity Tolerance Patient tolerated treatment well  Patient Left in chair;with call bell/phone within reach             Time: 1028-1052 OT Time Calculation (min): 24 min  Charges: OT Evaluation $Initial OT Evaluation Tier I: 1 Procedure OT Treatments $Self Care/Home Management : 23-37 mins  Mosetta Putt 10/07/2013, 1:11 PM

## 2013-10-07 NOTE — Progress Notes (Signed)
SATURATION QUALIFICATIONS: (This note is used to comply with regulatory documentation for home oxygen)  Patient Saturations on Room Air at Rest = 95%  Patient Saturations on Room Air while Ambulating = 80%  Patient Saturations on 3 Liters of oxygen while Ambulating = 91%  Please briefly explain why patient needs home oxygen: Pt very SOB while ambulating, Oxygen level dropped to 80%.

## 2013-10-07 NOTE — Progress Notes (Signed)
Speech Language Pathology Treatment: Dysphagia  Patient Details Name: [name] MRN: [id number] DOB: 1940-11-18 Today's Date: 10/07/2013 Time: 1100-1130 SLP Time Calculation (min): 30 min  Assessment / Plan / Recommendation Clinical Impression  Pt seated in chair, family members present. SLP observed pt with po trials of graham cracker/Peanut butter and gingerale.  No overt s/s aspiration observed or reported. SLP reviewed MBS results with pt and family, and gave written safe swallow precautions, and provided the opportunity to ask questions.  During education, family voiced concern about pt going home alone.  Her son, who lives with pt, works during the day, which leaves pt alone in the home for several hours.  SLP expressed concern (of pt, family, and SLP) regarding pt not being safe at home alone to progression team, including MD, Case Management, RN, given pending DC. Pt/family will be seen by the team to determine appropriate course of action. Primary RN also informed of this issue.  All education re: swallow function, compensatory strategies, diet recs has been completed.  ST to DC at this time. Please reconsult if needs arise. If cognitive testing is warranted, please order.    HPI HPI: 73 y.o. F w/ history of chronic diastolic CHF, uncontrolled diabetes mellitus, hypertension, sleep apnea, hypertension, CAD history of MI in the past, and anemia who presented w/ complaints of worsening shortness of breath x1 week. She also reported chest pain described as a tightness, intermittent up to 10 out of 10 in intensity sometimes. Patient admitted to leg swelling over the past several weeks. She was seen in the ED and imaging studies were negative for any fractures. CT of head showed no acute findings. BNP was elevated at 6285, chest x-ray showed improvement in central venous pulmonary congestion. She was treated with IV Lasix, steroids and nebs. Initial EKG showed atrial fibrillation at 94, but followup  tele monitoring she was found to be in atrial fibrillation with rapid ventricular rate response and started on Cardizem drip. Patient's blood glucose was greater than 400, potassium 5.9, CO2 of 20. She was given insulin, Kayexalate and admitted for further evaluation and management.    Pertinent Vitals VSS  SLP Plan  All goals met;Discharge SLP treatment due to (comment)    Recommendations Diet recommendations: Dysphagia 3 (mechanical soft);Thin liquid Liquids provided via: Cup;Straw Medication Administration: Whole meds with puree Supervision: Patient able to self feed;Staff to assist with self feeding;Full supervision/cueing for compensatory strategies Compensations: Slow rate;Small sips/bites;Follow solids with liquid Postural Changes and/or Swallow Maneuvers: Seated upright 90 degrees;Upright 30-60 min after meal;Out of bed for meals              Oral Care Recommendations: Oral care Q4 per protocol Follow up Recommendations: Skilled Nursing facility;24 hour supervision/assistance Plan: All goals met;Discharge SLP treatment due to (comment)    GO   Jhovany Weidinger B. Quentin Ore [facility], [place] [phone]   [name] 10/07/2013, 11:37 AM

## 2013-10-07 NOTE — Discharge Instructions (Signed)

## 2013-10-09 ENCOUNTER — Telehealth: Payer: Self-pay | Admitting: *Deleted

## 2013-10-09 NOTE — Telephone Encounter (Signed)
Called home health nurse and advised her that she is not our patient here at [facility] and she needs to contact the pt's PCP or the hospital.

## 2013-10-09 NOTE — Telephone Encounter (Signed)
I cannot do that as she is not my patient and those were not my orders. She needs to contact her PCP.

## 2013-10-09 NOTE — Telephone Encounter (Signed)
Home Health Nurse, Malachy Mood, called stating the pt has been in the hospital and was released to go home. She is quite confused by the orders for pt's insulin. The orders have her taking Humalog and Novolog. The nurse did not sound right. Can you please review the notes from the hospital and help with the proper insulin and dosing amount; and advise? Thank you.

## 2013-10-13 DIAGNOSIS — E119 Type 2 diabetes mellitus without complications: Secondary | ICD-10-CM | POA: Insufficient documentation

## 2013-10-13 DIAGNOSIS — N185 Chronic kidney disease, stage 5: Secondary | ICD-10-CM | POA: Insufficient documentation

## 2013-10-13 DIAGNOSIS — I10 Essential (primary) hypertension: Secondary | ICD-10-CM | POA: Insufficient documentation

## 2013-10-14 ENCOUNTER — Ambulatory Visit (INDEPENDENT_AMBULATORY_CARE_PROVIDER_SITE_OTHER): Payer: Medicare Other | Admitting: Internal Medicine

## 2013-10-14 ENCOUNTER — Ambulatory Visit: Payer: Medicare Other | Admitting: Internal Medicine

## 2013-10-14 ENCOUNTER — Encounter: Payer: Self-pay | Admitting: Internal Medicine

## 2013-10-14 VITALS — BP 120/68 | HR 85 | Resp 12 | Wt 237.6 lb

## 2013-10-14 DIAGNOSIS — E119 Type 2 diabetes mellitus without complications: Secondary | ICD-10-CM

## 2013-10-14 MED ORDER — INSULIN ASPART 100 UNIT/ML ~~LOC~~ SOLN: SUBCUTANEOUS | Status: DC

## 2013-10-14 MED ORDER — INSULIN GLARGINE 100 UNIT/ML ~~LOC~~ SOLN: 50.0000 [IU] | Freq: Every day | SUBCUTANEOUS | Status: DC

## 2013-10-14 NOTE — Patient Instructions (Signed)
Please decrease Lantus to 50 units at bedtime. Use NovoLog as follows: - 7 units with a smaller meal - 10 units with a larger meal Continue the sliding scale:  - 150-175: + 1 unit  - 176-200: + 2 units  - 201-225: + 3 units  - 226-250: + 4 units  - >250: + 5 units  Please return in 1 month with your sugar log.   PATIENT INSTRUCTIONS FOR TYPE 2 DIABETES:  **Please join MyChart!** - see attached instructions about how to join if you have not done so already.  DIET AND EXERCISE Diet and exercise is an important part of diabetic treatment.  We recommended aerobic exercise in the form of brisk walking (working between 40-60% of maximal aerobic capacity, similar to brisk walking) for 150 minutes per week (such as 30 minutes five days per week) along with 3 times per week performing 'resistance' training (using various gauge rubber tubes with handles) 5-10 exercises involving the major muscle groups (upper body, lower body and core) performing 10-15 repetitions (or near fatigue) each exercise. Start at half the above goal but build slowly to reach the above goals. If limited by weight, joint pain, or disability, we recommend daily walking in a swimming pool with water up to waist to reduce pressure from joints while allow for adequate exercise.    BLOOD GLUCOSES Monitoring your blood glucoses is important for continued management of your diabetes. Please check your blood glucoses 2-4 times a day: fasting, before meals and at bedtime (you can rotate these measurements - e.g. one day check before the 3 meals, the next day check before 2 of the meals and before bedtime, etc.).   HYPOGLYCEMIA (low blood sugar) Hypoglycemia is usually a reaction to not eating, exercising, or taking too much insulin/ other diabetes drugs.  Symptoms include tremors, sweating, hunger, confusion, headache, etc. Treat IMMEDIATELY with 15 grams of Carbs:   4 glucose tablets    cup regular juice/soda   2 tablespoons  raisins   4 teaspoons sugar   1 tablespoon honey Recheck blood glucose in 15 mins and repeat above if still symptomatic/blood glucose <100.  RECOMMENDATIONS TO REDUCE YOUR RISK OF DIABETIC COMPLICATIONS: * Take your prescribed MEDICATION(S) * Follow a DIABETIC diet: Complex carbs, fiber rich foods, (monounsaturated and polyunsaturated) fats * AVOID saturated/trans fats, high fat foods, >2,300 mg salt per day. * EXERCISE at least 5 times a week for 30 minutes or preferably daily.  * DO NOT SMOKE OR DRINK more than 1 drink a day. * Check your FEET every day. Do not wear tightfitting shoes. Contact us if you develop an ulcer * See your EYE doctor once a year or more if needed * Get a FLU shot once a year * Get a PNEUMONIA vaccine once before and once after age 61 years  GOALS:  * Your Hemoglobin A1c of <7%  * fasting sugars need to be <130 * after meals sugars need to be <180 (2h after you start eating) * Your Systolic BP should be 254 or lower  * Your Diastolic BP should be 80 or lower  * Your HDL (Good Cholesterol) should be 40 or higher  * Your LDL (Bad Cholesterol) should be 100 or lower. * Your Triglycerides should be 150 or lower  * Your Urine microalbumin (kidney function) should be <30 * Your Body Mass Index should be 25 or lower   We will be glad to help you achieve these goals. Our telephone number is:  [phone].  

## 2013-10-14 NOTE — Progress Notes (Addendum)
Patient ID: [name], female   DOB: November 19, 1940, 73 y.o.   MRN: [id number]  HPI: [name] is a 73 y.o.-year-old female, referred by Dr Aileen Fass ([facility]), for management of DM2, non-insulin-dependent, uncontrolled, with complications (CAD, CHF, CKD stage 4, PN, DR). She is here with her son.  Patient has been diagnosed with diabetes in 1995; she started insulin in 2014.  Last hemoglobin A1c was: Lab Results  Component Value Date   HGBA1C 9.9* 10/02/2013   HGBA1C 8.5* 07/02/2013   HGBA1C 8.7* 07/01/2013   Pt is on a regimen of: - Lantus 40 units 2x a day - Novolog 4 units tid ac (based on SSI)  Pt checks her sugars 1-4 a day and they are: - am: 44, 59, 90-360 - 2h after b'fast:n/c - before lunch: 73, 218-360 - 2h after lunch: n/c - before dinner: 237-547 - 2h after dinner: n/c - bedtime: 409-811 - nighttime: n/c + lows. Lowest sugar was 25 and 44; she has hypoglycemia awareness at 70.  Highest sugar was 500s.  Pt's meals are: - Breakfast: egg and coffee - Lunch: yoghurt +/- fruit cup, water - Dinner: hamburger, fish, chicken + vegetables + starch - Snacks: no  - Has stage 4 CKD, last BUN/creatinine:  Lab Results  Component Value Date   BUN 85* 10/07/2013   CREATININE 2.53* 10/07/2013  Not on ACEI. - last set of lipids: Lab Results  Component Value Date   CHOL 96 07/02/2013   HDL 49 07/02/2013   LDLCALC 34 07/02/2013   TRIG 66 07/02/2013   CHOLHDL 2.0 07/02/2013  Not on statins. - last eye exam was in 06/2013. + DR.  - no numbness and tingling in her feet.  No FH of DM.  ROS: Constitutional: + both weight gain/loss, no fatigue, + subjective hyperthermia Eyes: no blurry vision, no xerophthalmia ENT: + sore throat, no nodules palpated in throat, no dysphagia/odynophagia, no hoarseness Cardiovascular: + CP/+ SOB/no palpitations/+ leg swelling Respiratory: + all: cough/SOB/wheezing Gastrointestinal: no N/V/D/C, + heartburn Musculoskeletal: no muscle/joint  aches Skin: no rashes Neurological: no tremors/numbness/tingling/dizziness, + HA Psychiatric: no depression/anxiety Burning with urination, excessive urination  Past Medical History  Diagnosis Date  . Diabetes mellitus   . Hypertension   . Hyperlipemia   . Fibromyalgia   . Obesity   . Sleep apnea     No CPAP  . Anxiety   . Hiatal hernia   . NSTEMI (non-ST elevated myocardial infarction) 04/27/2011  . Anemia 04/27/2011  . Restless leg syndrome   . CAD (coronary artery disease)     small dominant right coronary artery with diffuse 95% stenosis. The LAD had 70% stenosis in the mid vessel beyond the diagonal branch but was a small vessel. Circumflex had proximal 30% stenosis.   Marland Kitchen External hemorrhoids   . Arrhythmia 10/11/2011  . Community acquired pneumonia 06/24/2012  . Renal insufficiency 05/06/2011  . Shingles   . CHF exacerbation 10/02/2013   Past Surgical History  Procedure Laterality Date  . Appendectomy    . Fracture surgery      left humerous  . Ganglion cyst removal     History   Social History  . Marital Status: Widowed    Spouse Name: N/A    Number of Children: 4  . Years of Education: 12th   Occupational History  . Retired    Social History Main Topics  . Smoking status: Never Smoker   . Smokeless tobacco: Never Used  . Alcohol Use:  No  . Drug Use: No  . Sexual Activity: No   Social History Narrative   Patient lives at home with son   Current Outpatient Prescriptions on File Prior to Visit  Medication Sig Dispense Refill  . amoxicillin-clavulanate (AUGMENTIN) 875-125 MG per tablet Take 1 tablet by mouth 2 (two) times daily.  10 tablet  0  . carvedilol (COREG) 6.25 MG tablet Take 6.25 mg by mouth 2 (two) times daily with a meal.      . Cyanocobalamin 500 MCG SUBL Place 1 tablet under the tongue daily.      Marland Kitchen diltiazem (CARDIZEM SR) 60 MG 12 hr capsule Take 1 capsule (60 mg total) by mouth 2 (two) times daily.  30 capsule  0  . ferrous sulfate 325 (65  FE) MG tablet Take 325 mg by mouth 2 (two) times daily.      . insulin aspart (NOVOLOG) 100 UNIT/ML injection Inject 0-9 Units into the skin 3 (three) times daily with meals.  10 mL  11  . insulin glargine (LANTUS) 100 UNIT/ML injection Inject 0.45 mLs (45 Units total) into the skin 2 (two) times daily.  10 mL  11  . insulin lispro (HUMALOG KWIKPEN) 100 UNIT/ML KiwkPen Inject 10 Units into the skin 3 (three) times daily with meals.      . isosorbide mononitrate (IMDUR) 30 MG 24 hr tablet Take 30 mg by mouth daily.      . nitroGLYCERIN (NITROSTAT) 0.4 MG SL tablet Place 1 mg under the tongue every 5 (five) minutes x 3 doses as needed for chest pain. For chest pain      . potassium chloride SA (K-DUR,KLOR-CON) 20 MEQ tablet Take 20 mEq by mouth daily.      . predniSONE (DELTASONE) 10 MG tablet Takes 6 tablets for 1 days, then 5 tablets for 1 days, then 4 tablets for 1 days, then 3 tablets for 1 days, then 2 tabs for 1 days, then 1 tab for 1 days, and then stop.  21 tablet  0  . pregabalin (LYRICA) 150 MG capsule Take 150 mg by mouth 2 (two) times daily.      Marland Kitchen senna-docusate (SENOKOT-S) 8.6-50 MG per tablet Take 1 tablet by mouth at bedtime as needed for mild constipation.  30 tablet  0  . torsemide (DEMADEX) 20 MG tablet Take 2 tablets (40 mg total) by mouth daily.  60 tablet  0  . Vitamin D, Ergocalciferol, (DRISDOL) 50000 UNITS CAPS capsule Take 50,000 Units by mouth every Thursday.      . warfarin (COUMADIN) 5 MG tablet Take 1 tablet (5 mg total) by mouth one time only at 6 PM.  15 tablet  0   No current facility-administered medications on file prior to visit.   Allergies  Allergen Reactions  . Codeine Other (See Comments)    Childhood allergy   Family History  Problem Relation Age of Onset  . Stomach cancer Mother   . Heart disease Maternal Grandmother   . Heart disease Maternal Grandfather   . Dementia Father    PE: BP 120/68  Pulse 85  Resp 12  Wt 237 lb 9.6 oz (107.775 kg)   SpO2 96% Wt Readings from Last 3 Encounters:  10/14/13 237 lb 9.6 oz (107.775 kg)  10/07/13 248 lb 7.3 oz (112.7 kg)  09/09/13 249 lb (112.946 kg)   Constitutional: obese, in wheelchair, oxygen tank attached, in NAD Eyes: PERRLA, EOMI, no exophthalmos ENT: moist mucous membranes, no thyromegaly, no  cervical lymphadenopathy Cardiovascular: RRR, No MRG Respiratory: CTA B Gastrointestinal: abdomen soft, NT, ND, BS+ Musculoskeletal: no deformities, strength intact in all 4 Skin: warm, multiple bruises on arms, very dry skin Neurological: ++ tremor with outstretched hands  ASSESSMENT: 1. DM2, insulin-dependent, uncontrolled, with complications - CAD - CHF - CKD stage 4 - PN - DR  PLAN:  1. Patient with long-standing, uncontrolled diabetes, on basal-bolus insulin regimen, with a large amount of basal insulin and only SSI for meals >> very fluctuating sugar levels. - We discussed about options for treatment, and I suggested to:  Patient Instructions  Please decrease Lantus to 50 units at bedtime. Use NovoLog as follows: - 7 units with a smaller meal - 10 units with a larger meal Continue the sliding scale:  - 150-175: + 1 unit  - 176-200: + 2 units  - 201-225: + 3 units  - 226-250: + 4 units  - >250: + 5 units Please return in 1 month with your sugar log.  - continue checking sugars at different times of the day - check 3 times a day, rotating checks - given new sugar log and advised how to fill it and to bring it at next appt  - given foot care handout and explained the principles  - given instructions for hypoglycemia management "15-15 rule"  - advised for yearly eye exams >> she is up to date - Return to clinic in 1 mo with sugar log

## 2013-10-30 ENCOUNTER — Inpatient Hospital Stay (HOSPITAL_COMMUNITY)
Admission: EM | Admit: 2013-10-30 | Discharge: 2013-11-05 | DRG: 292 | Disposition: A | Discharge status: Skilled Nursing Facility | Payer: Medicare Other | Attending: Internal Medicine | Admitting: Internal Medicine

## 2013-10-30 ENCOUNTER — Emergency Department (HOSPITAL_COMMUNITY): Payer: Medicare Other

## 2013-10-30 ENCOUNTER — Encounter (HOSPITAL_COMMUNITY): Payer: Self-pay | Admitting: Emergency Medicine

## 2013-10-30 DIAGNOSIS — E785 Hyperlipidemia, unspecified: Secondary | ICD-10-CM | POA: Diagnosis present

## 2013-10-30 DIAGNOSIS — Z7901 Long term (current) use of anticoagulants: Secondary | ICD-10-CM

## 2013-10-30 DIAGNOSIS — I119 Hypertensive heart disease without heart failure: Secondary | ICD-10-CM

## 2013-10-30 DIAGNOSIS — W19XXXA Unspecified fall, initial encounter: Secondary | ICD-10-CM

## 2013-10-30 DIAGNOSIS — F411 Generalized anxiety disorder: Secondary | ICD-10-CM | POA: Diagnosis present

## 2013-10-30 DIAGNOSIS — R748 Abnormal levels of other serum enzymes: Secondary | ICD-10-CM

## 2013-10-30 DIAGNOSIS — D649 Anemia, unspecified: Secondary | ICD-10-CM

## 2013-10-30 DIAGNOSIS — R740 Nonspecific elevation of levels of transaminase and lactic acid dehydrogenase [LDH]: Secondary | ICD-10-CM

## 2013-10-30 DIAGNOSIS — Z6841 Body Mass Index (BMI) 40.0 and over, adult: Secondary | ICD-10-CM

## 2013-10-30 DIAGNOSIS — R739 Hyperglycemia, unspecified: Secondary | ICD-10-CM

## 2013-10-30 DIAGNOSIS — IMO0001 Reserved for inherently not codable concepts without codable children: Secondary | ICD-10-CM | POA: Diagnosis present

## 2013-10-30 DIAGNOSIS — Z79899 Other long term (current) drug therapy: Secondary | ICD-10-CM

## 2013-10-30 DIAGNOSIS — I2510 Atherosclerotic heart disease of native coronary artery without angina pectoris: Secondary | ICD-10-CM | POA: Diagnosis present

## 2013-10-30 DIAGNOSIS — N184 Chronic kidney disease, stage 4 (severe): Secondary | ICD-10-CM | POA: Diagnosis present

## 2013-10-30 DIAGNOSIS — Y92009 Unspecified place in unspecified non-institutional (private) residence as the place of occurrence of the external cause: Secondary | ICD-10-CM

## 2013-10-30 DIAGNOSIS — R079 Chest pain, unspecified: Secondary | ICD-10-CM | POA: Diagnosis present

## 2013-10-30 DIAGNOSIS — B029 Zoster without complications: Secondary | ICD-10-CM

## 2013-10-30 DIAGNOSIS — E8729 Other acidosis: Secondary | ICD-10-CM

## 2013-10-30 DIAGNOSIS — R7401 Elevation of levels of liver transaminase levels: Secondary | ICD-10-CM

## 2013-10-30 DIAGNOSIS — R05 Cough: Secondary | ICD-10-CM

## 2013-10-30 DIAGNOSIS — J189 Pneumonia, unspecified organism: Secondary | ICD-10-CM

## 2013-10-30 DIAGNOSIS — I509 Heart failure, unspecified: Secondary | ICD-10-CM | POA: Diagnosis present

## 2013-10-30 DIAGNOSIS — E1165 Type 2 diabetes mellitus with hyperglycemia: Secondary | ICD-10-CM

## 2013-10-30 DIAGNOSIS — Z794 Long term (current) use of insulin: Secondary | ICD-10-CM

## 2013-10-30 DIAGNOSIS — E669 Obesity, unspecified: Secondary | ICD-10-CM | POA: Diagnosis present

## 2013-10-30 DIAGNOSIS — G4730 Sleep apnea, unspecified: Secondary | ICD-10-CM | POA: Diagnosis present

## 2013-10-30 DIAGNOSIS — K761 Chronic passive congestion of liver: Secondary | ICD-10-CM | POA: Diagnosis present

## 2013-10-30 DIAGNOSIS — R4781 Slurred speech: Secondary | ICD-10-CM

## 2013-10-30 DIAGNOSIS — J9610 Chronic respiratory failure, unspecified whether with hypoxia or hypercapnia: Secondary | ICD-10-CM | POA: Diagnosis present

## 2013-10-30 DIAGNOSIS — I129 Hypertensive chronic kidney disease with stage 1 through stage 4 chronic kidney disease, or unspecified chronic kidney disease: Secondary | ICD-10-CM | POA: Diagnosis present

## 2013-10-30 DIAGNOSIS — E119 Type 2 diabetes mellitus without complications: Secondary | ICD-10-CM | POA: Diagnosis present

## 2013-10-30 DIAGNOSIS — R058 Other specified cough: Secondary | ICD-10-CM

## 2013-10-30 DIAGNOSIS — I214 Non-ST elevation (NSTEMI) myocardial infarction: Secondary | ICD-10-CM

## 2013-10-30 DIAGNOSIS — I4891 Unspecified atrial fibrillation: Secondary | ICD-10-CM | POA: Diagnosis present

## 2013-10-30 DIAGNOSIS — I499 Cardiac arrhythmia, unspecified: Secondary | ICD-10-CM

## 2013-10-30 DIAGNOSIS — G2581 Restless legs syndrome: Secondary | ICD-10-CM

## 2013-10-30 DIAGNOSIS — I639 Cerebral infarction, unspecified: Secondary | ICD-10-CM

## 2013-10-30 DIAGNOSIS — N289 Disorder of kidney and ureter, unspecified: Secondary | ICD-10-CM

## 2013-10-30 DIAGNOSIS — I252 Old myocardial infarction: Secondary | ICD-10-CM

## 2013-10-30 DIAGNOSIS — N179 Acute kidney failure, unspecified: Secondary | ICD-10-CM

## 2013-10-30 DIAGNOSIS — IMO0002 Reserved for concepts with insufficient information to code with codable children: Secondary | ICD-10-CM

## 2013-10-30 DIAGNOSIS — E872 Acidosis: Secondary | ICD-10-CM

## 2013-10-30 DIAGNOSIS — E1142 Type 2 diabetes mellitus with diabetic polyneuropathy: Secondary | ICD-10-CM | POA: Diagnosis present

## 2013-10-30 DIAGNOSIS — I5033 Acute on chronic diastolic (congestive) heart failure: Principal | ICD-10-CM | POA: Diagnosis present

## 2013-10-30 DIAGNOSIS — E1149 Type 2 diabetes mellitus with other diabetic neurological complication: Secondary | ICD-10-CM | POA: Diagnosis present

## 2013-10-30 DIAGNOSIS — R001 Bradycardia, unspecified: Secondary | ICD-10-CM

## 2013-10-30 DIAGNOSIS — N189 Chronic kidney disease, unspecified: Secondary | ICD-10-CM

## 2013-10-30 DIAGNOSIS — E1140 Type 2 diabetes mellitus with diabetic neuropathy, unspecified: Secondary | ICD-10-CM

## 2013-10-30 DIAGNOSIS — J9600 Acute respiratory failure, unspecified whether with hypoxia or hypercapnia: Secondary | ICD-10-CM

## 2013-10-30 DIAGNOSIS — E875 Hyperkalemia: Secondary | ICD-10-CM

## 2013-10-30 LAB — PRO B NATRIURETIC PEPTIDE: PRO B NATRI PEPTIDE: 4361 pg/mL — AB (ref 0–125)

## 2013-10-30 LAB — COMPREHENSIVE METABOLIC PANEL
ALBUMIN: 2.9 g/dL — AB (ref 3.5–5.2)
ALT: 162 U/L — ABNORMAL HIGH (ref 0–35)
AST: 189 U/L — ABNORMAL HIGH (ref 0–37)
Alkaline Phosphatase: 93 U/L (ref 39–117)
BILIRUBIN TOTAL: 0.3 mg/dL (ref 0.3–1.2)
BUN: 87 mg/dL — AB (ref 6–23)
CALCIUM: 8.5 mg/dL (ref 8.4–10.5)
CO2: 22 mEq/L (ref 19–32)
CREATININE: 2.45 mg/dL — AB (ref 0.50–1.10)
Chloride: 104 mEq/L (ref 96–112)
GFR calc Af Amer: 22 mL/min — ABNORMAL LOW (ref >90)
GFR calc non Af Amer: 19 mL/min — ABNORMAL LOW (ref >90)
Glucose, Bld: 183 mg/dL — ABNORMAL HIGH (ref 70–99)
Potassium: 4.7 mEq/L (ref 3.7–5.3)
Sodium: 139 mEq/L (ref 137–147)
TOTAL PROTEIN: 7.1 g/dL (ref 6.0–8.3)

## 2013-10-30 LAB — CBC WITH DIFFERENTIAL/PLATELET
Basophils Absolute: 0 10*3/uL (ref 0.0–0.1)
Basophils Relative: 0 % (ref 0–1)
EOS PCT: 4 % (ref 0–5)
Eosinophils Absolute: 0.2 10*3/uL (ref 0.0–0.7)
HEMATOCRIT: 31.4 % — AB (ref 36.0–46.0)
Hemoglobin: 10 g/dL — ABNORMAL LOW (ref 12.0–15.0)
Lymphocytes Relative: 41 % (ref 12–46)
Lymphs Abs: 2.5 10*3/uL (ref 0.7–4.0)
MCH: 29.5 pg (ref 26.0–34.0)
MCHC: 31.8 g/dL (ref 30.0–36.0)
MCV: 92.6 fL (ref 78.0–100.0)
Monocytes Absolute: 0.6 10*3/uL (ref 0.1–1.0)
Monocytes Relative: 10 % (ref 3–12)
Neutro Abs: 2.9 10*3/uL (ref 1.7–7.7)
Neutrophils Relative %: 45 % (ref 43–77)
PLATELETS: 140 10*3/uL — AB (ref 150–400)
RBC: 3.39 MIL/uL — ABNORMAL LOW (ref 3.87–5.11)
RDW: 17.3 % — ABNORMAL HIGH (ref 11.5–15.5)
WBC: 6.2 10*3/uL (ref 4.0–10.5)

## 2013-10-30 LAB — I-STAT CG4 LACTIC ACID, ED: Lactic Acid, Venous: 0.56 mmol/L (ref 0.5–2.2)

## 2013-10-30 LAB — I-STAT TROPONIN, ED: TROPONIN I, POC: 0.02 ng/mL (ref 0.00–0.08)

## 2013-10-30 NOTE — ED Notes (Signed)
Pt reports SOB and coughing for several days and chest just began to hurt when got into triage room. Worse with deep breathe. States she feels like she is "choking to death". Bloody nose today as well.

## 2013-10-30 NOTE — ED Provider Notes (Addendum)
CSN: [id number]     Arrival date & time 10/30/13  1945 History   First MD Initiated Contact with Patient 10/30/13 2308     Chief Complaint  Patient presents with  . Cough  . Chest Pain     (Consider location/radiation/quality/duration/timing/severity/associated sxs/prior Treatment) HPI Comments: 73 y.o. F w/ history of chronic diastolic CHF, uncontrolled diabetes mellitus, hypertension, sleep apnea, CAD history, with ? MI in the past, pulm HTN and CKD comes in with cc of cough and chest pain. Pt has been having chest pain for the last several days. Chest pain is substernal, non radiating, tightness type, with no specific aggravating or relieving factors. Pt has a cough - dry. She has orthopnea, worsening of pnd and also admits to weight gain. Pt has leg swelling - appears normal to her. No hx of DVt, PE = she is on coumadin for Afib.  Patient is a 73 y.o. female presenting with cough and chest pain. The history is provided by the patient.  Cough Associated symptoms: chest pain and shortness of breath   Associated symptoms: no headaches   Chest Pain Associated symptoms: cough and shortness of breath   Associated symptoms: no abdominal pain, no headache, no nausea and not vomiting     Past Medical History  Diagnosis Date  . Diabetes mellitus   . Hypertension   . Hyperlipemia   . Fibromyalgia   . Obesity   . Sleep apnea     No CPAP  . Anxiety   . Hiatal hernia   . NSTEMI (non-ST elevated myocardial infarction) 04/27/2011  . Anemia 04/27/2011  . Restless leg syndrome   . CAD (coronary artery disease)     small dominant right coronary artery with diffuse 95% stenosis. The LAD had 70% stenosis in the mid vessel beyond the diagonal branch but was a small vessel. Circumflex had proximal 30% stenosis.   Marland Kitchen External hemorrhoids   . Arrhythmia 10/11/2011  . Community acquired pneumonia 06/24/2012  . Renal insufficiency 05/06/2011  . Shingles   . CHF exacerbation 10/02/2013   Past Surgical  History  Procedure Laterality Date  . Appendectomy    . Fracture surgery      left humerous  . Ganglion cyst removal     Family History  Problem Relation Age of Onset  . Stomach cancer Mother   . Heart disease Maternal Grandmother   . Heart disease Maternal Grandfather   . Dementia Father    History  Substance Use Topics  . Smoking status: Never Smoker   . Smokeless tobacco: Never Used  . Alcohol Use: No   OB History   Grav Para Term Preterm Abortions TAB SAB Ect Mult Living                 Review of Systems  Constitutional: Positive for activity change.  Respiratory: Positive for cough and shortness of breath.   Cardiovascular: Positive for chest pain.  Gastrointestinal: Negative for nausea, vomiting and abdominal pain.  Genitourinary: Negative for dysuria.  Musculoskeletal: Negative for neck pain.  Neurological: Negative for headaches.  All other systems reviewed and are negative.     Allergies  Codeine  Home Medications   Prior to Admission medications   Medication Sig Start Date End Date Taking? Authorizing Provider  atorvastatin (LIPITOR) 80 MG tablet Take 80 mg by mouth daily.   Yes Historical Provider, MD  carvedilol (COREG) 6.25 MG tablet Take 6.25 mg by mouth 2 (two) times daily with a meal.  Yes Historical Provider, MD  cetirizine (ZYRTEC) 10 MG tablet Take 10 mg by mouth daily as needed for allergies.    Yes Historical Provider, MD  Cyanocobalamin 500 MCG SUBL Place 1 tablet under the tongue daily.   Yes Historical Provider, MD  diltiazem (CARDIZEM SR) 60 MG 12 hr capsule Take 1 capsule (60 mg total) by mouth 2 (two) times daily. 10/07/13  Yes Charlynne Cousins, MD  ferrous sulfate 325 (65 FE) MG tablet Take 325 mg by mouth 2 (two) times daily.   Yes Historical Provider, MD  insulin aspart (NOVOLOG) 100 UNIT/ML injection Inject 0-40 Units into the skin 3 (three) times daily before meals.    Yes Historical Provider, MD  insulin glargine (LANTUS) 100  UNIT/ML injection Inject 0.5 mLs (50 Units total) into the skin at bedtime. 10/14/13  Yes Philemon Kingdom, MD  isosorbide mononitrate (IMDUR) 30 MG 24 hr tablet Take 30 mg by mouth daily.   Yes Historical Provider, MD  nitroGLYCERIN (NITROSTAT) 0.4 MG SL tablet Place 1 mg under the tongue every 5 (five) minutes x 3 doses as needed for chest pain. For chest pain   Yes Historical Provider, MD  pregabalin (LYRICA) 150 MG capsule Take 150 mg by mouth 2 (two) times daily.   Yes Historical Provider, MD  senna-docusate (SENOKOT-S) 8.6-50 MG per tablet Take 1 tablet by mouth at bedtime as needed for mild constipation. 07/03/13  Yes Robbie Lis, MD  torsemide (DEMADEX) 20 MG tablet Take 2 tablets (40 mg total) by mouth daily. 11/05/12  Yes Radene Gunning, NP  Vitamin D, Ergocalciferol, (DRISDOL) 50000 UNITS CAPS capsule Take 50,000 Units by mouth every Thursday.   Yes Historical Provider, MD  warfarin (COUMADIN) 5 MG tablet Take 1 tablet (5 mg total) by mouth one time only at 6 PM. 10/07/13  Yes Charlynne Cousins, MD   BP 131/95  Pulse 118  Temp(Src) 98.2 F (36.8 C) (Oral)  Resp 28  SpO2 98% Physical Exam  Nursing note and vitals reviewed. Constitutional: She is oriented to person, place, and time. She appears well-developed and well-nourished.  HENT:  Head: Normocephalic and atraumatic.  Eyes: EOM are normal. Pupils are equal, round, and reactive to light.  Neck: Neck supple.  Cardiovascular: Normal rate, regular rhythm and normal heart sounds.   No murmur heard. Pulmonary/Chest: Effort normal. No respiratory distress.  Fine rhonchi, anteriorly and bibasilar crackles.  Abdominal: Soft. She exhibits no distension. There is no tenderness. There is no rebound and no guarding.  Neurological: She is alert and oriented to person, place, and time.  Skin: Skin is warm and dry.    ED Course  Procedures (including critical care time) Labs Review Labs Reviewed  COMPREHENSIVE METABOLIC PANEL -  Abnormal; Notable for the following:    Glucose, Bld 183 (*)    BUN 87 (*)    Creatinine, Ser 2.45 (*)    Albumin 2.9 (*)    AST 189 (*)    ALT 162 (*)    GFR calc non Af Amer 19 (*)    GFR calc Af Amer 22 (*)    All other components within normal limits  CBC WITH DIFFERENTIAL - Abnormal; Notable for the following:    RBC 3.39 (*)    Hemoglobin 10.0 (*)    HCT 31.4 (*)    RDW 17.3 (*)    Platelets 140 (*)    All other components within normal limits  PRO B NATRIURETIC PEPTIDE - Abnormal; Notable  for the following:    Pro B Natriuretic peptide (BNP) 4361.0 (*)    All other components within normal limits  URINALYSIS, ROUTINE W REFLEX MICROSCOPIC - Abnormal; Notable for the following:    APPearance CLOUDY (*)    Protein, ur 100 (*)    All other components within normal limits  URINE MICROSCOPIC-ADD ON  APTT  PROTIME-INR  I-STAT TROPOININ, ED  I-STAT CG4 LACTIC ACID, ED    Imaging Review Dg Chest 2 View  10/30/2013   CLINICAL DATA:  Shortness of breath, cough for 4 days, history hypertension, diabetes, coronary artery disease post MI  EXAM: CHEST  2 VIEW  COMPARISON:  10/04/2013  FINDINGS: Enlargement of cardiac silhouette with pulmonary vascular congestion.  Atherosclerotic calcification aorta.  Linear density projects over LEFT upper lobe on the PA view not identified on lateral view, likely superimposed artifact.  No acute infiltrate, pleural effusion or pneumothorax.  Bones demineralized.  IMPRESSION: Enlargement of cardiac silhouette with pulmonary vascular congestion.  No acute abnormalities.   Electronically Signed   By: Lavonia Dana M.D.   On: 10/30/2013 21:43     EKG Interpretation   Date/Time:  Thursday Oct 30 2013 20:09:49 EDT Ventricular Rate:  99 PR Interval:    QRS Duration: 124 QT Interval:  320 QTC Calculation: 410 R Axis:   87 Text Interpretation:  Atrial fibrillation Right bundle branch block  Abnormal ECG Confirmed by Larnie Heart, MD, Thelma Comp [phone]) on  10/30/2013  11:11:26 PM      MDM   Final diagnoses:  Acute exacerbation of CHF (congestive heart failure)    Pt comes in with cc of atypical chest pain, cough, dib. Exam shows some signs of fluid overload. Xrays shows no PNA, there is no infiltrate seen, no fevers, no WC elevation.  Pain could be due to CHF, or with her significant CAD risk factors and hx of CAD - ACS related pain. LFTs slightle elevated, Cr is at baseline. Possibly worsening of her pulm HTN. ASA given. Nitro ordered.  Will reassess. Likely will need admission.  Varney Biles, MD 10/31/13 0140  Varney Biles, MD 10/31/13 0140

## 2013-10-31 ENCOUNTER — Inpatient Hospital Stay (HOSPITAL_COMMUNITY): Payer: Medicare Other

## 2013-10-31 ENCOUNTER — Encounter (HOSPITAL_COMMUNITY): Payer: Self-pay | Admitting: Internal Medicine

## 2013-10-31 DIAGNOSIS — I509 Heart failure, unspecified: Secondary | ICD-10-CM | POA: Diagnosis present

## 2013-10-31 DIAGNOSIS — Z7901 Long term (current) use of anticoagulants: Secondary | ICD-10-CM | POA: Diagnosis not present

## 2013-10-31 DIAGNOSIS — R0602 Shortness of breath: Secondary | ICD-10-CM | POA: Diagnosis present

## 2013-10-31 DIAGNOSIS — N184 Chronic kidney disease, stage 4 (severe): Secondary | ICD-10-CM | POA: Diagnosis present

## 2013-10-31 DIAGNOSIS — I252 Old myocardial infarction: Secondary | ICD-10-CM | POA: Diagnosis not present

## 2013-10-31 DIAGNOSIS — R079 Chest pain, unspecified: Secondary | ICD-10-CM

## 2013-10-31 DIAGNOSIS — Z79899 Other long term (current) drug therapy: Secondary | ICD-10-CM | POA: Diagnosis not present

## 2013-10-31 DIAGNOSIS — I2510 Atherosclerotic heart disease of native coronary artery without angina pectoris: Secondary | ICD-10-CM | POA: Diagnosis present

## 2013-10-31 DIAGNOSIS — E669 Obesity, unspecified: Secondary | ICD-10-CM | POA: Diagnosis present

## 2013-10-31 DIAGNOSIS — Z794 Long term (current) use of insulin: Secondary | ICD-10-CM | POA: Diagnosis not present

## 2013-10-31 DIAGNOSIS — E1149 Type 2 diabetes mellitus with other diabetic neurological complication: Secondary | ICD-10-CM | POA: Diagnosis present

## 2013-10-31 DIAGNOSIS — I4891 Unspecified atrial fibrillation: Secondary | ICD-10-CM

## 2013-10-31 DIAGNOSIS — F411 Generalized anxiety disorder: Secondary | ICD-10-CM | POA: Diagnosis present

## 2013-10-31 DIAGNOSIS — I129 Hypertensive chronic kidney disease with stage 1 through stage 4 chronic kidney disease, or unspecified chronic kidney disease: Secondary | ICD-10-CM | POA: Diagnosis present

## 2013-10-31 DIAGNOSIS — E785 Hyperlipidemia, unspecified: Secondary | ICD-10-CM | POA: Diagnosis present

## 2013-10-31 DIAGNOSIS — Z6841 Body Mass Index (BMI) 40.0 and over, adult: Secondary | ICD-10-CM | POA: Diagnosis not present

## 2013-10-31 DIAGNOSIS — K761 Chronic passive congestion of liver: Secondary | ICD-10-CM | POA: Diagnosis present

## 2013-10-31 DIAGNOSIS — E1142 Type 2 diabetes mellitus with diabetic polyneuropathy: Secondary | ICD-10-CM | POA: Diagnosis present

## 2013-10-31 DIAGNOSIS — J9610 Chronic respiratory failure, unspecified whether with hypoxia or hypercapnia: Secondary | ICD-10-CM | POA: Diagnosis present

## 2013-10-31 DIAGNOSIS — I5033 Acute on chronic diastolic (congestive) heart failure: Secondary | ICD-10-CM | POA: Diagnosis present

## 2013-10-31 DIAGNOSIS — IMO0001 Reserved for inherently not codable concepts without codable children: Secondary | ICD-10-CM | POA: Diagnosis present

## 2013-10-31 DIAGNOSIS — G4730 Sleep apnea, unspecified: Secondary | ICD-10-CM | POA: Diagnosis present

## 2013-10-31 LAB — TROPONIN I: Troponin I: <0.3 ng/mL (ref <0.30)

## 2013-10-31 LAB — CBC
HCT: 30.5 % — ABNORMAL LOW (ref 36.0–46.0)
HEMOGLOBIN: 9.6 g/dL — AB (ref 12.0–15.0)
MCH: 29.2 pg (ref 26.0–34.0)
MCHC: 31.5 g/dL (ref 30.0–36.0)
MCV: 92.7 fL (ref 78.0–100.0)
Platelets: 122 10*3/uL — ABNORMAL LOW (ref 150–400)
RBC: 3.29 MIL/uL — AB (ref 3.87–5.11)
RDW: 17.5 % — ABNORMAL HIGH (ref 11.5–15.5)
WBC: 6.3 10*3/uL (ref 4.0–10.5)

## 2013-10-31 LAB — HEPATIC FUNCTION PANEL
ALK PHOS: 85 U/L (ref 39–117)
ALT: 136 U/L — AB (ref 0–35)
AST: 136 U/L — ABNORMAL HIGH (ref 0–37)
Albumin: 2.7 g/dL — ABNORMAL LOW (ref 3.5–5.2)
BILIRUBIN TOTAL: 0.3 mg/dL (ref 0.3–1.2)
Total Protein: 6.7 g/dL (ref 6.0–8.3)

## 2013-10-31 LAB — ACETAMINOPHEN LEVEL: Acetaminophen (Tylenol), Serum: <15 ug/mL (ref 10–30)

## 2013-10-31 LAB — GLUCOSE, CAPILLARY
Glucose-Capillary: 136 mg/dL — ABNORMAL HIGH (ref 70–99)
Glucose-Capillary: 139 mg/dL — ABNORMAL HIGH (ref 70–99)
Glucose-Capillary: 267 mg/dL — ABNORMAL HIGH (ref 70–99)
Glucose-Capillary: 219 mg/dL — ABNORMAL HIGH (ref 70–99)
Glucose-Capillary: 234 mg/dL — ABNORMAL HIGH (ref 70–99)

## 2013-10-31 LAB — URINE MICROSCOPIC-ADD ON

## 2013-10-31 LAB — URINALYSIS, ROUTINE W REFLEX MICROSCOPIC
Bilirubin Urine: NEGATIVE
Glucose, UA: NEGATIVE mg/dL
HGB URINE DIPSTICK: NEGATIVE
Ketones, ur: NEGATIVE mg/dL
Leukocytes, UA: NEGATIVE
NITRITE: NEGATIVE
Protein, ur: 100 mg/dL — AB
SPECIFIC GRAVITY, URINE: 1.018 (ref 1.005–1.030)
Urobilinogen, UA: 0.2 mg/dL (ref 0.0–1.0)
pH: 5 (ref 5.0–8.0)

## 2013-10-31 LAB — BASIC METABOLIC PANEL
BUN: 80 mg/dL — ABNORMAL HIGH (ref 6–23)
CHLORIDE: 105 meq/L (ref 96–112)
CO2: 21 mEq/L (ref 19–32)
Calcium: 8.4 mg/dL (ref 8.4–10.5)
Creatinine, Ser: 2.29 mg/dL — ABNORMAL HIGH (ref 0.50–1.10)
GFR calc non Af Amer: 20 mL/min — ABNORMAL LOW (ref >90)
GFR, EST AFRICAN AMERICAN: 23 mL/min — AB (ref >90)
Glucose, Bld: 160 mg/dL — ABNORMAL HIGH (ref 70–99)
Potassium: 4.4 mEq/L (ref 3.7–5.3)
Sodium: 139 mEq/L (ref 137–147)

## 2013-10-31 LAB — PROTIME-INR
INR: 2.49 — ABNORMAL HIGH (ref 0.00–1.49)
Prothrombin Time: 26.1 seconds — ABNORMAL HIGH (ref 11.6–15.2)

## 2013-10-31 LAB — APTT: APTT: 46 s — AB (ref 24–37)

## 2013-10-31 MED ORDER — PREGABALIN 75 MG PO CAPS
150.0000 mg | ORAL_CAPSULE | Freq: Two times a day (BID) | ORAL | Status: DC
  Administered 2013-10-31 – 2013-11-05 (×11): 150 mg via ORAL
  Filled 2013-10-31 (×11): qty 2

## 2013-10-31 MED ORDER — ISOSORBIDE MONONITRATE ER 30 MG PO TB24
30.0000 mg | ORAL_TABLET | Freq: Every day | ORAL | Status: DC
  Administered 2013-10-31 – 2013-11-05 (×6): 30 mg via ORAL
  Filled 2013-10-31 (×6): qty 1

## 2013-10-31 MED ORDER — LORATADINE 10 MG PO TABS
10.0000 mg | ORAL_TABLET | Freq: Every day | ORAL | Status: DC
  Administered 2013-10-31 – 2013-11-05 (×6): 10 mg via ORAL
  Filled 2013-10-31 (×7): qty 1

## 2013-10-31 MED ORDER — ONDANSETRON HCL 4 MG PO TABS: 4.0000 mg | ORAL_TABLET | Freq: Four times a day (QID) | ORAL | Status: DC | PRN

## 2013-10-31 MED ORDER — FUROSEMIDE 10 MG/ML IJ SOLN
20.0000 mg | Freq: Once | INTRAMUSCULAR | Status: AC
  Administered 2013-10-31: 20 mg via INTRAVENOUS
  Filled 2013-10-31: qty 2

## 2013-10-31 MED ORDER — ACETAMINOPHEN 650 MG RE SUPP: 650.0000 mg | Freq: Four times a day (QID) | RECTAL | Status: DC | PRN

## 2013-10-31 MED ORDER — CARVEDILOL 6.25 MG PO TABS
6.2500 mg | ORAL_TABLET | Freq: Two times a day (BID) | ORAL | Status: DC
  Administered 2013-10-31 – 2013-11-05 (×12): 6.25 mg via ORAL
  Filled 2013-10-31 (×13): qty 1

## 2013-10-31 MED ORDER — FERROUS SULFATE 325 (65 FE) MG PO TABS
325.0000 mg | ORAL_TABLET | Freq: Two times a day (BID) | ORAL | Status: DC
  Administered 2013-10-31 – 2013-11-05 (×12): 325 mg via ORAL
  Filled 2013-10-31 (×13): qty 1

## 2013-10-31 MED ORDER — SENNOSIDES-DOCUSATE SODIUM 8.6-50 MG PO TABS
1.0000 | ORAL_TABLET | Freq: Every evening | ORAL | Status: DC | PRN
  Filled 2013-10-31: qty 1

## 2013-10-31 MED ORDER — ATORVASTATIN CALCIUM 80 MG PO TABS
80.0000 mg | ORAL_TABLET | Freq: Every day | ORAL | Status: DC
  Filled 2013-10-31: qty 1

## 2013-10-31 MED ORDER — SODIUM CHLORIDE 0.9 % IJ SOLN
3.0000 mL | Freq: Two times a day (BID) | INTRAMUSCULAR | Status: DC
  Administered 2013-11-01 – 2013-11-05 (×6): 3 mL via INTRAVENOUS

## 2013-10-31 MED ORDER — INSULIN GLARGINE 100 UNIT/ML ~~LOC~~ SOLN
25.0000 [IU] | Freq: Every day | SUBCUTANEOUS | Status: DC
  Administered 2013-10-31 – 2013-11-01 (×2): 25 [IU] via SUBCUTANEOUS
  Filled 2013-10-31 (×2): qty 0.25

## 2013-10-31 MED ORDER — WARFARIN - PHARMACIST DOSING INPATIENT: Freq: Every day | Status: DC

## 2013-10-31 MED ORDER — ONDANSETRON HCL 4 MG/2ML IJ SOLN: 4.0000 mg | Freq: Four times a day (QID) | INTRAMUSCULAR | Status: DC | PRN

## 2013-10-31 MED ORDER — SODIUM CHLORIDE 0.9 % IJ SOLN
3.0000 mL | Freq: Two times a day (BID) | INTRAMUSCULAR | Status: DC
  Administered 2013-10-31 – 2013-11-04 (×5): 3 mL via INTRAVENOUS

## 2013-10-31 MED ORDER — LEVALBUTEROL HCL 0.63 MG/3ML IN NEBU: 0.6300 mg | INHALATION_SOLUTION | Freq: Four times a day (QID) | RESPIRATORY_TRACT | Status: DC | PRN

## 2013-10-31 MED ORDER — BUDESONIDE 0.25 MG/2ML IN SUSP
0.2500 mg | Freq: Two times a day (BID) | RESPIRATORY_TRACT | Status: DC
  Administered 2013-10-31 – 2013-11-05 (×11): 0.25 mg via RESPIRATORY_TRACT
  Filled 2013-10-31 (×13): qty 2

## 2013-10-31 MED ORDER — VITAMIN D (ERGOCALCIFEROL) 1.25 MG (50000 UNIT) PO CAPS: 50000.0000 [IU] | ORAL_CAPSULE | ORAL | Status: DC

## 2013-10-31 MED ORDER — ACETAMINOPHEN 325 MG PO TABS: 650.0000 mg | ORAL_TABLET | Freq: Four times a day (QID) | ORAL | Status: DC | PRN

## 2013-10-31 MED ORDER — WARFARIN SODIUM 5 MG PO TABS
5.0000 mg | ORAL_TABLET | Freq: Every day | ORAL | Status: DC
  Administered 2013-10-31: 5 mg via ORAL
  Filled 2013-10-31 (×2): qty 1

## 2013-10-31 MED ORDER — INSULIN GLARGINE 100 UNIT/ML ~~LOC~~ SOLN
50.0000 [IU] | Freq: Every day | SUBCUTANEOUS | Status: DC
  Filled 2013-10-31 (×2): qty 0.5

## 2013-10-31 MED ORDER — DILTIAZEM HCL ER 60 MG PO CP12
60.0000 mg | ORAL_CAPSULE | Freq: Two times a day (BID) | ORAL | Status: DC
  Administered 2013-10-31 – 2013-11-03 (×7): 60 mg via ORAL
  Filled 2013-10-31 (×9): qty 1

## 2013-10-31 MED ORDER — INSULIN ASPART 100 UNIT/ML ~~LOC~~ SOLN
0.0000 [IU] | Freq: Three times a day (TID) | SUBCUTANEOUS | Status: DC
  Administered 2013-10-31: 5 [IU] via SUBCUTANEOUS
  Administered 2013-10-31: 3 [IU] via SUBCUTANEOUS
  Administered 2013-11-01 (×2): 2 [IU] via SUBCUTANEOUS

## 2013-10-31 MED ORDER — LEVALBUTEROL HCL 0.63 MG/3ML IN NEBU
0.6300 mg | INHALATION_SOLUTION | Freq: Four times a day (QID) | RESPIRATORY_TRACT | Status: DC
Start: 2013-10-31 — End: 2013-11-02
  Administered 2013-10-31 – 2013-11-01 (×6): 0.63 mg via RESPIRATORY_TRACT
  Filled 2013-10-31 (×10): qty 3

## 2013-10-31 MED ORDER — FUROSEMIDE 10 MG/ML IJ SOLN
40.0000 mg | Freq: Two times a day (BID) | INTRAMUSCULAR | Status: DC
Start: 2013-10-31 — End: 2013-11-01
  Administered 2013-10-31 – 2013-11-01 (×3): 40 mg via INTRAVENOUS
  Filled 2013-10-31 (×6): qty 4

## 2013-10-31 MED ORDER — NITROGLYCERIN 0.6 MG SL SUBL
1.0000 mg | SUBLINGUAL_TABLET | SUBLINGUAL | Status: DC | PRN
  Administered 2013-11-01: 0.4 mg via SUBLINGUAL
  Filled 2013-10-31: qty 25

## 2013-10-31 NOTE — Progress Notes (Signed)
ANTICOAGULATION CONSULT NOTE - Initial Consult  Pharmacy Consult for Coumadin Indication: atrial fibrillation  Allergies  Allergen Reactions  . Codeine Other (See Comments)    Childhood allergy    Patient Measurements: Height: 5\' 3"  (160 cm) Weight: 264 lb (119.75 kg) IBW/kg (Calculated) : 52.4  Vital Signs: Temp: 98.3 F (36.8 C) (05/29 0250) Temp src: Oral (05/29 0250) BP: 146/79 mmHg (05/29 0250) Pulse Rate: 86 (05/29 0250)  Labs:  Recent Labs  10/30/13 2010 10/31/13 0110  HGB 10.0*  --   HCT 31.4*  --   PLT 140*  --   APTT  --  46*  LABPROT  --  26.1*  INR  --  2.49*  CREATININE 2.45*  --     Estimated Creatinine Clearance: 26 ml/min (by C-G formula based on Cr of 2.45).   Medical History: Past Medical History  Diagnosis Date  . Diabetes mellitus   . Hypertension   . Hyperlipemia   . Fibromyalgia   . Obesity   . Sleep apnea     No CPAP  . Anxiety   . Hiatal hernia   . NSTEMI (non-ST elevated myocardial infarction) 04/27/2011  . Anemia 04/27/2011  . Restless leg syndrome   . CAD (coronary artery disease)     small dominant right coronary artery with diffuse 95% stenosis. The LAD had 70% stenosis in the mid vessel beyond the diagonal branch but was a small vessel. Circumflex had proximal 30% stenosis.   Marland Kitchen External hemorrhoids   . Arrhythmia 10/11/2011  . Community acquired pneumonia 06/24/2012  . Renal insufficiency 05/06/2011  . Shingles   . CHF exacerbation 10/02/2013    Medications:  Prescriptions prior to admission  Medication Sig Dispense Refill  . atorvastatin (LIPITOR) 80 MG tablet Take 80 mg by mouth daily.      . carvedilol (COREG) 6.25 MG tablet Take 6.25 mg by mouth 2 (two) times daily with a meal.      . cetirizine (ZYRTEC) 10 MG tablet Take 10 mg by mouth daily as needed for allergies.       . Cyanocobalamin 500 MCG SUBL Place 1 tablet under the tongue daily.      Marland Kitchen diltiazem (CARDIZEM SR) 60 MG 12 hr capsule Take 1 capsule (60 mg  total) by mouth 2 (two) times daily.  30 capsule  0  . ferrous sulfate 325 (65 FE) MG tablet Take 325 mg by mouth 2 (two) times daily.      . insulin aspart (NOVOLOG) 100 UNIT/ML injection Inject 0-40 Units into the skin 3 (three) times daily before meals.       . insulin glargine (LANTUS) 100 UNIT/ML injection Inject 0.5 mLs (50 Units total) into the skin at bedtime.  20 mL  11  . isosorbide mononitrate (IMDUR) 30 MG 24 hr tablet Take 30 mg by mouth daily.      . nitroGLYCERIN (NITROSTAT) 0.4 MG SL tablet Place 1 mg under the tongue every 5 (five) minutes x 3 doses as needed for chest pain. For chest pain      . pregabalin (LYRICA) 150 MG capsule Take 150 mg by mouth 2 (two) times daily.      Marland Kitchen senna-docusate (SENOKOT-S) 8.6-50 MG per tablet Take 1 tablet by mouth at bedtime as needed for mild constipation.  30 tablet  0  . torsemide (DEMADEX) 20 MG tablet Take 2 tablets (40 mg total) by mouth daily.  60 tablet  0  . Vitamin D, Ergocalciferol, (DRISDOL) 50000  UNITS CAPS capsule Take 50,000 Units by mouth every Thursday.      . warfarin (COUMADIN) 5 MG tablet Take 1 tablet (5 mg total) by mouth one time only at 6 PM.  15 tablet  0    Assessment: 73 yo female admitted with cough/chest pain, h/o Afib, to continue Coumadin  Goal of Therapy:  INR 2-3 Monitor platelets by anticoagulation protocol: Yes   Plan:  Continue Coumadin 5 mg daily Daily INR  Bronson Curb See Beharry 10/31/2013,3:17 AM

## 2013-10-31 NOTE — Progress Notes (Signed)
PROGRESS NOTE  [name]:[id number] DOB: [birth year] DOA: 10/30/2013 PCP: Sherrie Mustache, MD  Assessment/Plan: Acute on chronic diastolic CHF -35/00/9381 echocardiogram--EF 60-65% -Continue intravenous furosemide every 12 hours -10/07/2013 discharge weight--112 kg -Weight on admission 119.7 kg -Daily weights -Question outpatient compliance Atypical chest pain -Troponins negative x3 -EKG--RBBB unchanged Transaminasemia -Abdominal ultrasound remarkable -Likely due to hepatic congestion from CHF -Continue to trend Diabetes mellitus type 2, uncontrolled -Hemoglobin A1c 9.9 on 10/02/2013 -Give half home dose of Lantus -NovoLog sliding scale -Monitor CBGs and adjust accordingly Atrial fibrillation -Rate controlled -Continue warfarin -Continue carvedilol and diltiazem CKD stage IV -Serum creatinine at baseline -Continue to monitor -Baseline creatinine 2.2-2.6 Hyperlipidemia -Continue statin Coronary artery disease -Stable -Continue Imdur  Family Communication:   Pt at beside Disposition Plan:   Home when medically stable   Procedures/Studies: Dg Chest 1 View  10/02/2013   CLINICAL DATA:  Right shoulder bruising fall, right shoulder bruising  EXAM: CHEST - 1 VIEW  COMPARISON:  Chest radiograph 08/20/2013  FINDINGS: Stable enlarged cardiac silhouette. There is improvement in central venous pulmonary congestion pattern seen on prior. Bibasilar atelectasis appear  IMPRESSION: 1. Improvement in central venous pulmonary congestion. 2. Persistent cardiomegaly. 3. Bibasilar atelectasis.   Electronically Signed   By: Suzy Bouchard M.D.   On: 10/02/2013 10:22   Dg Chest 2 View  10/30/2013   CLINICAL DATA:  Shortness of breath, cough for 4 days, history hypertension, diabetes, coronary artery disease post MI  EXAM: CHEST  2 VIEW  COMPARISON:  10/04/2013  FINDINGS: Enlargement of cardiac silhouette with pulmonary vascular congestion.  Atherosclerotic  calcification aorta.  Linear density projects over LEFT upper lobe on the PA view not identified on lateral view, likely superimposed artifact.  No acute infiltrate, pleural effusion or pneumothorax.  Bones demineralized.  IMPRESSION: Enlargement of cardiac silhouette with pulmonary vascular congestion.  No acute abnormalities.   Electronically Signed   By: Lavonia Dana M.D.   On: 10/30/2013 21:43   Dg Shoulder Right  10/02/2013   CLINICAL DATA:  Right shoulder pain  EXAM: RIGHT SHOULDER - 2+ VIEW  COMPARISON:  None.  FINDINGS: There is no fracture or dislocation. There are mild degenerative changes of the acromioclavicular joint. Mild interstitial thickening involving the right lung.  IMPRESSION: No acute osseous injury of the right shoulder.   Electronically Signed   By: Kathreen Devoid   On: 10/02/2013 10:05   Ct Head Wo Contrast  10/02/2013   CLINICAL DATA:  No cervical spine fracture or traumatic subluxation. Similar appearance to priors.  EXAM: CT HEAD WITHOUT CONTRAST  CT CERVICAL SPINE WITHOUT CONTRAST  TECHNIQUE: Multidetector CT imaging of the head and cervical spine was performed following the standard protocol without intravenous contrast. Multiplanar CT image reconstructions of the cervical spine were also generated.  COMPARISON:  08/20/2013.  FINDINGS: CT HEAD FINDINGS  No evidence for acute infarction, hemorrhage, mass lesion, hydrocephalus, or extra-axial fluid. Mild atrophy. Chronic microvascular ischemic change. Remote left posterior temporal/occipital infarct. Vascular calcification. Calvarium intact. No sinus or mastoid disease.  CT CERVICAL SPINE FINDINGS  There is no visible cervical spine fracture, traumatic subluxation, prevertebral soft tissue swelling, or intraspinal hematoma. Carotid atherosclerosis. Facet arthropathy. Clear lung apices. No dominant neck mass.  IMPRESSION: Atrophy and small vessel disease similar to priors. Remote cerebral infarction. No skull fracture or intracranial  hemorrhage.   Electronically Signed   By: Rolla Flatten M.D.   On: 10/02/2013 10:35  Ct Cervical Spine Wo Contrast  10/02/2013   CLINICAL DATA:  No cervical spine fracture or traumatic subluxation. Similar appearance to priors.  EXAM: CT HEAD WITHOUT CONTRAST  CT CERVICAL SPINE WITHOUT CONTRAST  TECHNIQUE: Multidetector CT imaging of the head and cervical spine was performed following the standard protocol without intravenous contrast. Multiplanar CT image reconstructions of the cervical spine were also generated.  COMPARISON:  08/20/2013.  FINDINGS: CT HEAD FINDINGS  No evidence for acute infarction, hemorrhage, mass lesion, hydrocephalus, or extra-axial fluid. Mild atrophy. Chronic microvascular ischemic change. Remote left posterior temporal/occipital infarct. Vascular calcification. Calvarium intact. No sinus or mastoid disease.  CT CERVICAL SPINE FINDINGS  There is no visible cervical spine fracture, traumatic subluxation, prevertebral soft tissue swelling, or intraspinal hematoma. Carotid atherosclerosis. Facet arthropathy. Clear lung apices. No dominant neck mass.  IMPRESSION: Atrophy and small vessel disease similar to priors. Remote cerebral infarction. No skull fracture or intracranial hemorrhage.   Electronically Signed   By: Rolla Flatten M.D.   On: 10/02/2013 10:35   Dg Swallowing Function  10/05/2013   CLINICAL DATA:  73 year old female with possible aspiration. Dysphagia.  EXAM: MODIFIED BARIUM SWALLOW  TECHNIQUE: Different consistencies of barium were administered orally to the patient by the Speech Pathologist. Imaging of the pharynx was performed in the lateral projection.  FLUOROSCOPY TIME:  1 min 18 seconds  COMPARISON:  None.  FINDINGS: With thin preparation of barium, early pharyngeal spillover and laryngeal penetration noted without aspiration.  With thick preparation of barium, mild early pharyngeal spillover and laryngeal penetration noted without aspiration.  With solid and pill  preparations of barium, normal oropharyngeal transport noted without aspiration.  Normal epiglottic inversion with all consistencies noted.  IMPRESSION: Early pharyngeal spillover and laryngeal penetration with thick and thin preparations of barium. No evidence of aspiration.  Please refer to the Speech Pathologists report for complete details and recommendations.   Electronically Signed   By: Hassan Rowan M.D.   On: 10/05/2013 18:24   Dg Chest Port 1 View  10/04/2013   CLINICAL DATA:  Dyspnea.  Worsening CHF.  Cough.  EXAM: PORTABLE CHEST - 1 VIEW  COMPARISON:  DG CHEST 1 VIEW dated 10/02/2013  FINDINGS: Shallow inspiration. Cardiac enlargement with mild pulmonary vascular congestion. Residual but improving infiltration in the right lung base.  IMPRESSION: Cardiac enlargement with pulmonary vascular congestion. No edema. Improving infiltrates in the bases with some residual on the right.   Electronically Signed   By: Lucienne Capers M.D.   On: 10/04/2013 06:28   Dg Shoulder Left  10/02/2013   CLINICAL DATA:  Left shoulder pain.  EXAM: LEFT SHOULDER - 2+ VIEW  COMPARISON:  None.  FINDINGS: There is no fracture or dislocation. There are mild degenerative changes of the acromioclavicular joint.  IMPRESSION: No acute osseous injury of the left shoulder.   Electronically Signed   By: Kathreen Devoid   On: 10/02/2013 10:06         Subjective:   Objective: Filed Vitals:   10/31/13 0250 10/31/13 0900 10/31/13 0913 10/31/13 1328  BP: 146/79  149/100 155/78  Pulse: 86  100 115  Temp: 98.3 F (36.8 C)   99.1 F (37.3 C)  TempSrc: Oral   Oral  Resp: 20   19  Height: 5\' 3"  (1.6 m)     Weight: 119.75 kg (264 lb)     SpO2: 100% 98%  96%    Intake/Output Summary (Last 24 hours) at 10/31/13 1433 Last data filed at  10/31/13 1400  Gross per 24 hour  Intake    360 ml  Output   2350 ml  Net  -1990 ml   Weight change:  Exam:   General:  Pt is alert, follows commands appropriately, not in acute  distress  HEENT: No icterus, No thrush,[place]/AT  Cardiovascular: RRR, S1/S2, no rubs, no gallops  Respiratory: Bibasilar crackles. No wheezing. Good air movement.  Abdomen: Soft/+BS, non tender, non distended, no guarding  Extremities: 1+LE edema, No lymphangitis, No petechiae, No rashes, no synovitis  Data Reviewed: Basic Metabolic Panel:  Recent Labs Lab 10/30/13 2010 10/31/13 0450  NA 139 139  K 4.7 4.4  CL 104 105  CO2 22 21  GLUCOSE 183* 160*  BUN 87* 80*  CREATININE 2.45* 2.29*  CALCIUM 8.5 8.4   Liver Function Tests:  Recent Labs Lab 10/30/13 2010 10/31/13 0450  AST 189* 136*  ALT 162* 136*  ALKPHOS 93 85  BILITOT 0.3 0.3  PROT 7.1 6.7  ALBUMIN 2.9* 2.7*   No results found for this basename: LIPASE, AMYLASE,  in the last 168 hours No results found for this basename: AMMONIA,  in the last 168 hours CBC:  Recent Labs Lab 10/30/13 2010 10/31/13 0450  WBC 6.2 6.3  NEUTROABS 2.9  --   HGB 10.0* 9.6*  HCT 31.4* 30.5*  MCV 92.6 92.7  PLT 140* 122*   Cardiac Enzymes:  Recent Labs Lab 10/31/13 0450 10/31/13 1059  TROPONINI <0.30 <0.30   BNP: No components found with this basename: POCBNP,  CBG:  Recent Labs Lab 10/31/13 0308 10/31/13 0742 10/31/13 1156  GLUCAP 136* 139* 267*    No results found for this or any previous visit (from the past 240 hour(s)).   Scheduled Meds: . budesonide (PULMICORT) nebulizer solution  0.25 mg Nebulization BID  . carvedilol  6.25 mg Oral BID WC  . diltiazem  60 mg Oral BID  . ferrous sulfate  325 mg Oral BID WC  . furosemide  40 mg Intravenous Q12H  . insulin aspart  0-9 Units Subcutaneous TID WC  . insulin glargine  25 Units Subcutaneous Daily  . isosorbide mononitrate  30 mg Oral Daily  . levalbuterol  0.63 mg Nebulization Q6H  . loratadine  10 mg Oral Daily  . pregabalin  150 mg Oral BID  . sodium chloride  3 mL Intravenous Q12H  . sodium chloride  3 mL Intravenous Q12H  . [START ON 11/06/2013]  Vitamin D (Ergocalciferol)  50,000 Units Oral Q Thu  . warfarin  5 mg Oral q1800  . Warfarin - Pharmacist Dosing Inpatient   Does not apply q1800   Continuous Infusions:    Geraldo Docker  [facility] Pager [phone]  If 7PM-7AM, please contact night-coverage [email or web address] Password [email or web address] 10/31/2013, 2:33 PM   LOS: 1 day

## 2013-10-31 NOTE — H&P (Signed)
[facility] History and Physical  [name] [name] DOB: [birth year] DOA: 10/30/2013  Referring physician: ER physician. PCP: Sherrie Mustache, MD  Chief Complaint: Shortness of breath and chest pain.  HPI: [name] is a 73 y.o. female with history of CHF, CAD, atrial fibrillation, chronic kidney disease, diabetes mellitus presents to the ER because of increasing shortness of breath over the last few days with chest pain. Patient's symptoms are increased on exertion. Patient's shortness of breath is present even at rest. Also has some productive cough. Chest pain is retrosternal and epigastric. Denies any nausea vomiting abdominal pain though patient has noticed increased abdominal girth and lower extremity edema. Patient states she has not taken her diuretics for last few days. In the ER patient was given Lasix IV and cardiac markers were negative admitted for further management. Labs reveal elevated LFTs. Patient is afebrile.   Review of Systems: As presented in the history of presenting illness, rest negative.  Past Medical History  Diagnosis Date  . Diabetes mellitus   . Hypertension   . Hyperlipemia   . Fibromyalgia   . Obesity   . Sleep apnea     No CPAP  . Anxiety   . Hiatal hernia   . NSTEMI (non-ST elevated myocardial infarction) 04/27/2011  . Anemia 04/27/2011  . Restless leg syndrome   . CAD (coronary artery disease)     small dominant right coronary artery with diffuse 95% stenosis. The LAD had 70% stenosis in the mid vessel beyond the diagonal branch but was a small vessel. Circumflex had proximal 30% stenosis.   Marland Kitchen External hemorrhoids   . Arrhythmia 10/11/2011  . Community acquired pneumonia 06/24/2012  . Renal insufficiency 05/06/2011  . Shingles   . CHF exacerbation 10/02/2013   Past Surgical History  Procedure Laterality Date  . Appendectomy    . Fracture surgery      left humerous  . Ganglion cyst removal     Social History:  reports that  she has never smoked. She has never used smokeless tobacco. She reports that she does not drink alcohol or use illicit drugs. Where does patient live home. Can patient participate in ADLs? Yes.  Allergies  Allergen Reactions  . Codeine Other (See Comments)    Childhood allergy    Family History:  Family History  Problem Relation Age of Onset  . Stomach cancer Mother   . Heart disease Maternal Grandmother   . Heart disease Maternal Grandfather   . Dementia Father       Prior to Admission medications   Medication Sig Start Date End Date Taking? Authorizing Provider  atorvastatin (LIPITOR) 80 MG tablet Take 80 mg by mouth daily.   Yes Historical Provider, MD  carvedilol (COREG) 6.25 MG tablet Take 6.25 mg by mouth 2 (two) times daily with a meal.   Yes Historical Provider, MD  cetirizine (ZYRTEC) 10 MG tablet Take 10 mg by mouth daily as needed for allergies.    Yes Historical Provider, MD  Cyanocobalamin 500 MCG SUBL Place 1 tablet under the tongue daily.   Yes Historical Provider, MD  diltiazem (CARDIZEM SR) 60 MG 12 hr capsule Take 1 capsule (60 mg total) by mouth 2 (two) times daily. 10/07/13  Yes Charlynne Cousins, MD  ferrous sulfate 325 (65 FE) MG tablet Take 325 mg by mouth 2 (two) times daily.   Yes Historical Provider, MD  insulin aspart (NOVOLOG) 100 UNIT/ML injection Inject 0-40 Units into the skin 3 (three)  times daily before meals.    Yes Historical Provider, MD  insulin glargine (LANTUS) 100 UNIT/ML injection Inject 0.5 mLs (50 Units total) into the skin at bedtime. 10/14/13  Yes Philemon Kingdom, MD  isosorbide mononitrate (IMDUR) 30 MG 24 hr tablet Take 30 mg by mouth daily.   Yes Historical Provider, MD  nitroGLYCERIN (NITROSTAT) 0.4 MG SL tablet Place 1 mg under the tongue every 5 (five) minutes x 3 doses as needed for chest pain. For chest pain   Yes Historical Provider, MD  pregabalin (LYRICA) 150 MG capsule Take 150 mg by mouth 2 (two) times daily.   Yes Historical  Provider, MD  senna-docusate (SENOKOT-S) 8.6-50 MG per tablet Take 1 tablet by mouth at bedtime as needed for mild constipation. 07/03/13  Yes Robbie Lis, MD  torsemide (DEMADEX) 20 MG tablet Take 2 tablets (40 mg total) by mouth daily. 11/05/12  Yes Radene Gunning, NP  Vitamin D, Ergocalciferol, (DRISDOL) 50000 UNITS CAPS capsule Take 50,000 Units by mouth every Thursday.   Yes Historical Provider, MD  warfarin (COUMADIN) 5 MG tablet Take 1 tablet (5 mg total) by mouth one time only at 6 PM. 10/07/13  Yes Charlynne Cousins, MD    Physical Exam: Filed Vitals:   10/30/13 2346 10/31/13 0153 10/31/13 0203 10/31/13 0215  BP: 131/95 154/48 117/54 132/78  Pulse:   100 74  Temp:      TempSrc:      Resp: 28 18 17 20   SpO2: 98% 100% 97% 100%     General:  Well-developed and nourished.  Eyes: Anicteric no pallor.  ENT: No discharge from the ears eyes nose mouth.  Neck: No JVD appreciated.  Cardiovascular: S1-S2 heard.  Respiratory: Bilateral expiratory wheeze.  Abdomen: Soft nontender bowel sounds present. No guarding or rigidity.  Skin: No rash.  Musculoskeletal: Bilateral extremity edema.  Psychiatric: Appears normal.  Neurologic: Alert awake oriented to time place and person. Moves all extremities.  Labs on Admission:  Basic Metabolic Panel:  Recent Labs Lab 10/30/13 2010  NA 139  K 4.7  CL 104  CO2 22  GLUCOSE 183*  BUN 87*  CREATININE 2.45*  CALCIUM 8.5   Liver Function Tests:  Recent Labs Lab 10/30/13 2010  AST 189*  ALT 162*  ALKPHOS 93  BILITOT 0.3  PROT 7.1  ALBUMIN 2.9*   No results found for this basename: LIPASE, AMYLASE,  in the last 168 hours No results found for this basename: AMMONIA,  in the last 168 hours CBC:  Recent Labs Lab 10/30/13 2010  WBC 6.2  NEUTROABS 2.9  HGB 10.0*  HCT 31.4*  MCV 92.6  PLT 140*   Cardiac Enzymes: No results found for this basename: CKTOTAL, CKMB, CKMBINDEX, TROPONINI,  in the last 168 hours  BNP  (last 3 results)  Recent Labs  07/03/13 0457 10/02/13 0905 10/30/13 2010  PROBNP 3715.0* 6285.0* 4361.0*   CBG: No results found for this basename: GLUCAP,  in the last 168 hours  Radiological Exams on Admission: Dg Chest 2 View  10/30/2013   CLINICAL DATA:  Shortness of breath, cough for 4 days, history hypertension, diabetes, coronary artery disease post MI  EXAM: CHEST  2 VIEW  COMPARISON:  10/04/2013  FINDINGS: Enlargement of cardiac silhouette with pulmonary vascular congestion.  Atherosclerotic calcification aorta.  Linear density projects over LEFT upper lobe on the PA view not identified on lateral view, likely superimposed artifact.  No acute infiltrate, pleural effusion or pneumothorax.  Bones  demineralized.  IMPRESSION: Enlargement of cardiac silhouette with pulmonary vascular congestion.  No acute abnormalities.   Electronically Signed   By: Lavonia Dana M.D.   On: 10/30/2013 21:43    EKG: Independently reviewed. Atrial fibrillation rate on 99 beats per minute.  Assessment/Plan Principal Problem:   Acute CHF Active Problems:   Diabetes mellitus   CKD (chronic kidney disease), stage IV   Chest pain   Atrial fibrillation   CHF (congestive heart failure)   1. Decompensated CHF diastolic last EF measured was 60-65% - patient has been placed on Lasix 40 mg IV Q. 12 hourly. Patient probably not compliant with her medications. Closely follow intake output and metabolic panel. Since patient was wheezing I have placed patient on nebulizer and Pulmicort. 2. Chest pain - cycle cardiac markers. Presently chest pain-free. Patient is on Coumadin. 3. Elevated LFTs - cause not clear. Hold statins. Check sonogram of the abdomen acute hepatitis panel and Tylenol levels. Follow LFTs closely. 4. Diabetes mellitus2 - continue home medications with coverage. 5. Atrial fibrillation - rate controlled. Coumadin per pharmacy. 6. Chronic kidney disease - creatinine appears to be at baseline.  Closely follow intake output and metabolic panel.    Code Status: Full code.  Family Communication: None.  Disposition Plan: Admit to inpatient.    [facility] Pager [phone].  If 7PM-7AM, please contact night-coverage [email or web address] Password [email or web address] 10/31/2013, 2:27 AM

## 2013-10-31 NOTE — ED Notes (Signed)
Nanavati, MD at bedside. 

## 2013-10-31 NOTE — Care Management Note (Signed)
    Page 1 of 1   11/03/2013     4:47:20 PM CARE MANAGEMENT NOTE 11/03/2013  Patient:  [name], [name]   Account Number:  [id number]  Date Initiated:  10/31/2013  Documentation initiated by:  GRAVES-BIGELOW,Malani Lees  Subjective/Objective Assessment:   Pt admitted for Shortness of breath and chest pain.     Action/Plan:   Pt states she is from home with family support. CM will continue to monitor for disposition needs.   Anticipated DC Date:  11/02/2013   Anticipated DC Plan:  [place]  CM consult      Choice offered to / List presented to:             Status of service:  Completed, signed off Medicare Important Message given?  YES (If response is "NO", the following Medicare IM given date fields will be blank) Date Medicare IM given:  10/31/2013 Date Additional Medicare IM given:    Discharge Disposition:  [place]  Per UR Regulation:  Reviewed for med. necessity/level of care/duration of stay  If discussed at [place] of Stay Meetings, dates discussed:   11/04/2013    Comments:  11-03-13 1646 Plan for SNF once medically stable for d/c. CSW assisting with disposition needs.

## 2013-10-31 NOTE — Progress Notes (Signed)
Inpatient Diabetes Program Recommendations  AACE/ADA: New Consensus Statement on Inpatient Glycemic Control (2013)  Target Ranges:  Prepandial:   less than 140 mg/dL      Peak postprandial:   less than 180 mg/dL (1-2 hours)      Critically ill patients:  140 - 180 mg/dL   Results for [name], [name] (MRN [id number]) as of 10/31/2013 12:49  Ref. Range 10/31/2013 03:08 10/31/2013 07:42 10/31/2013 11:56  Glucose-Capillary Latest Range: 70-99 mg/dL 136 (H) 139 (H) 267 (H)   Diabetes history: DM2 Outpatient Diabetes medications: Lantus 50 units QHS, Novolog 0-40 units TID with meals Current orders for Inpatient glycemic control: Lantus 50 units QHS, Novolog 0-9 units AC  Inpatient Diabetes Program Recommendations Insulin - Basal: Noted patient was not given Lantus 50 units last night (charted as Not Given at 3:56 am with reason "inadequate meal intake"). Therefore, please consider changing Lantus timing to Lantus 50 units Q24H (start now).  Note: In reviewing the chart, noted patient last saw endocrinologist (Dr. Cruzita Lederer on 10/20/13) and was noted to be having frequent hypoglycemia. As a result, patient was prescribed Lantus 50 units QHS (decreased from Lantus 45 units BID), Novolog 7-10 units TID with meals (7 units for small meal or 10 units for a large meal), Novolog 1-5 units TID for correction (150-175: + 1 unit ; - 176-200: + 2 units ; - 201-225: + 3 units ; - 226-250: + 4 units ; - >250: + 5 units).  Noted patient was ordered Lantus 50 units QHS on 5/29 at 3:13 am and first dose to start 10/31/13 at 3:45am. However Lantus was Not Given at 3:45 as it was charted as not given with reason "inadequate meal intake".  Since patient did not receive any basal insulin last night, anticipate CBGs to continue to increase unless Lantus is given now and ordered as Lantus 50 units Q24H (dose to be given now). Will continue to follow.  Thanks, Barnie Alderman, RN, MSN, CCRN Diabetes Coordinator Inpatient Diabetes  Program [phone] (Team Pager) [phone] ([facility] office) [phone] [facility] office)

## 2013-10-31 NOTE — Progress Notes (Signed)
Utilization review completed. Pink Maye, RN, BSN. 

## 2013-11-01 LAB — PROTIME-INR
INR: 1.52 — AB (ref 0.00–1.49)
PROTHROMBIN TIME: 17.9 s — AB (ref 11.6–15.2)

## 2013-11-01 LAB — BASIC METABOLIC PANEL
BUN: 77 mg/dL — ABNORMAL HIGH (ref 6–23)
CHLORIDE: 104 meq/L (ref 96–112)
CO2: 22 mEq/L (ref 19–32)
Calcium: 8.7 mg/dL (ref 8.4–10.5)
Creatinine, Ser: 2.6 mg/dL — ABNORMAL HIGH (ref 0.50–1.10)
GFR calc non Af Amer: 17 mL/min — ABNORMAL LOW (ref >90)
GFR, EST AFRICAN AMERICAN: 20 mL/min — AB (ref >90)
Glucose, Bld: 151 mg/dL — ABNORMAL HIGH (ref 70–99)
POTASSIUM: 4.5 meq/L (ref 3.7–5.3)
SODIUM: 142 meq/L (ref 137–147)

## 2013-11-01 LAB — GLUCOSE, CAPILLARY
GLUCOSE-CAPILLARY: 159 mg/dL — AB (ref 70–99)
GLUCOSE-CAPILLARY: 194 mg/dL — AB (ref 70–99)
Glucose-Capillary: 160 mg/dL — ABNORMAL HIGH (ref 70–99)
Glucose-Capillary: 217 mg/dL — ABNORMAL HIGH (ref 70–99)

## 2013-11-01 LAB — TROPONIN I: Troponin I: <0.3 ng/mL

## 2013-11-01 MED ORDER — INSULIN ASPART 100 UNIT/ML ~~LOC~~ SOLN: 0.0000 [IU] | Freq: Every day | SUBCUTANEOUS | Status: DC

## 2013-11-01 MED ORDER — FUROSEMIDE 40 MG PO TABS
40.0000 mg | ORAL_TABLET | Freq: Two times a day (BID) | ORAL | Status: DC
  Administered 2013-11-01 – 2013-11-02 (×2): 40 mg via ORAL
  Filled 2013-11-01 (×4): qty 1

## 2013-11-01 MED ORDER — INSULIN GLARGINE 100 UNIT/ML ~~LOC~~ SOLN
30.0000 [IU] | Freq: Every day | SUBCUTANEOUS | Status: DC
  Administered 2013-11-02 – 2013-11-04 (×3): 30 [IU] via SUBCUTANEOUS
  Filled 2013-11-01 (×3): qty 0.3

## 2013-11-01 MED ORDER — WARFARIN SODIUM 7.5 MG PO TABS
7.5000 mg | ORAL_TABLET | Freq: Once | ORAL | Status: AC
  Administered 2013-11-01: 7.5 mg via ORAL
  Filled 2013-11-01: qty 1

## 2013-11-01 MED ORDER — INSULIN ASPART 100 UNIT/ML ~~LOC~~ SOLN
0.0000 [IU] | Freq: Three times a day (TID) | SUBCUTANEOUS | Status: DC
  Administered 2013-11-01: 5 [IU] via SUBCUTANEOUS
  Administered 2013-11-02 (×2): 3 [IU] via SUBCUTANEOUS
  Administered 2013-11-02: 2 [IU] via SUBCUTANEOUS
  Administered 2013-11-03: 5 [IU] via SUBCUTANEOUS
  Administered 2013-11-03 – 2013-11-04 (×2): 3 [IU] via SUBCUTANEOUS

## 2013-11-01 NOTE — Progress Notes (Signed)
Pt c/o midsterna chest pain rating it a 5/10 with BP 132/59 and Hr 78. 12 lead EKG done without acute changes noted. Pt given 1 SL NTG with complete relief of pain within 3 min. BP 99/48. Fredirick Maudlin, NP made aware of above. Will cont to monitor closely.

## 2013-11-01 NOTE — Progress Notes (Signed)
[facility] progress note. Chief complaint. Chest pain. History of present illness. This 73 year old female in hospital with acute on chronic congestive heart failure and atypical chest pain. She has had negative troponins earlier in this hospitalization. She complained of 5/10 chest pain to nursing and a 12-lead EKG was obtained. EKG does not look significantly different from previous and does not look consistent with acute ischemia. Came to the bedside to see the patient and she describes 5/10 pain epigastric/low sternal area with relief following one sublingual nitroglycerin. She denied radiation of the pain, diaphoresis, or nausea. Vital signs. Temperature 97.8, pulse 94, respiration 19, blood pressure 114/75. O2 sats 97%. General appearance. Obese elderly female who is alert and in no distress. Cardiac. Rate and rhythm regular. Lungs. Breath sounds reduced but clear. Abdomen. Soft obese with positive bowel sounds. Impression/plan. Problem #1. Chest pain. Pain resolved after one sublingual nitroglycerin. EKG looks unchanged and does not look suggestive of acute ischemia. Nonetheless I will obtain further troponins now then every 6 hours for a total of 3 sets. We'll repeat a 12-lead EKG and approximately 6 hours to evaluate for any changes. Patient clinically stable at this time.

## 2013-11-01 NOTE — Progress Notes (Signed)
ANTICOAGULATION CONSULT NOTE - Follow up  Pharmacy Consult for Coumadin Indication: atrial fibrillation  Allergies  Allergen Reactions  . Codeine Other (See Comments)    Childhood allergy    Patient Measurements: Height: 5\' 3"  (160 cm) Weight: 247 lb 3.2 oz (112.129 kg) IBW/kg (Calculated) : 52.4  Vital Signs: Temp: 97.7 F (36.5 C) (05/30 0606) Temp src: Oral (05/30 0606) BP: 116/48 mmHg (05/30 0606) Pulse Rate: 77 (05/30 0606)  Labs:  Recent Labs  10/30/13 2010 10/31/13 0110 10/31/13 0450 10/31/13 1059 10/31/13 1515 11/01/13 0539  HGB 10.0*  --  9.6*  --   --   --   HCT 31.4*  --  30.5*  --   --   --   PLT 140*  --  122*  --   --   --   APTT  --  46*  --   --   --   --   LABPROT  --  26.1*  --   --   --  17.9*  INR  --  2.49*  --   --   --  1.52*  CREATININE 2.45*  --  2.29*  --   --  2.60*  TROPONINI  --   --  <0.30 <0.30 <0.30  --     Estimated Creatinine Clearance: 23.6 ml/min (by C-G formula based on Cr of 2.6).   Medical History: Past Medical History  Diagnosis Date  . Diabetes mellitus   . Hypertension   . Hyperlipemia   . Fibromyalgia   . Obesity   . Sleep apnea     No CPAP  . Anxiety   . Hiatal hernia   . NSTEMI (non-ST elevated myocardial infarction) 04/27/2011  . Anemia 04/27/2011  . Restless leg syndrome   . CAD (coronary artery disease)     small dominant right coronary artery with diffuse 95% stenosis. The LAD had 70% stenosis in the mid vessel beyond the diagonal branch but was a small vessel. Circumflex had proximal 30% stenosis.   Marland Kitchen External hemorrhoids   . Arrhythmia 10/11/2011  . Community acquired pneumonia 06/24/2012  . Renal insufficiency 05/06/2011  . Shingles   . CHF exacerbation 10/02/2013    Medications:  Prescriptions prior to admission  Medication Sig Dispense Refill  . atorvastatin (LIPITOR) 80 MG tablet Take 80 mg by mouth daily.      . carvedilol (COREG) 6.25 MG tablet Take 6.25 mg by mouth 2 (two) times daily with  a meal.      . cetirizine (ZYRTEC) 10 MG tablet Take 10 mg by mouth daily as needed for allergies.       . Cyanocobalamin 500 MCG SUBL Place 1 tablet under the tongue daily.      Marland Kitchen diltiazem (CARDIZEM SR) 60 MG 12 hr capsule Take 1 capsule (60 mg total) by mouth 2 (two) times daily.  30 capsule  0  . ferrous sulfate 325 (65 FE) MG tablet Take 325 mg by mouth 2 (two) times daily.      . insulin aspart (NOVOLOG) 100 UNIT/ML injection Inject 0-40 Units into the skin 3 (three) times daily before meals.       . insulin glargine (LANTUS) 100 UNIT/ML injection Inject 0.5 mLs (50 Units total) into the skin at bedtime.  20 mL  11  . isosorbide mononitrate (IMDUR) 30 MG 24 hr tablet Take 30 mg by mouth daily.      . nitroGLYCERIN (NITROSTAT) 0.4 MG SL tablet Place 1 mg under  the tongue every 5 (five) minutes x 3 doses as needed for chest pain. For chest pain      . pregabalin (LYRICA) 150 MG capsule Take 150 mg by mouth 2 (two) times daily.      Marland Kitchen senna-docusate (SENOKOT-S) 8.6-50 MG per tablet Take 1 tablet by mouth at bedtime as needed for mild constipation.  30 tablet  0  . torsemide (DEMADEX) 20 MG tablet Take 2 tablets (40 mg total) by mouth daily.  60 tablet  0  . Vitamin D, Ergocalciferol, (DRISDOL) 50000 UNITS CAPS capsule Take 50,000 Units by mouth every Thursday.      . warfarin (COUMADIN) 5 MG tablet Take 1 tablet (5 mg total) by mouth one time only at 6 PM.  15 tablet  0    Assessment: 73 yo female admitted with cough/chest pain.  Continuing coumadin for h/o afib.  INR today = 1.52, decreased from 2.49 yesterday.  Coumadin 5mg  dose charted given.  PTA dose is 5 mg daily, reported last taken on 5/28 prior to admission.  No bleeding noted. Atypical CP- Troponins neg x 3, EKG--RBBB unchanged per Dr. Doristine Devoid note yesterday.   Goal of Therapy:  INR 2-3 Monitor platelets by anticoagulation protocol: Yes   Plan:  Give Coumadin 7.5 mg today x1. Daily INR  Nicole Cella, RPh Clinical  Pharmacist Pager: [phone] 11/01/2013,12:45 PM

## 2013-11-01 NOTE — Progress Notes (Signed)
Pt with noted 2.03-2.22 sec pauses over 2 1/2 hour period while sleeping up in recliner. Pt easily awakened, asymptomatic, reports she has never has a sleep study and does not wear C-PAP at nite. Loud snoring noted while pt was napping. MD updated

## 2013-11-01 NOTE — Plan of Care (Signed)
Problem: Phase II Progression Outcomes Goal: Walk in hall or up in chair TID Outcome: Completed/Met Date Met:  11/01/13 [place] OOB up in chair  Minimal SOB when up to [facility] on 2L O2 per N/C

## 2013-11-01 NOTE — Progress Notes (Signed)
PROGRESS NOTE  TRYPHENA PERKOVICH HFW:[id number] DOB: 06-Jan-1941 DOA: 10/30/2013 PCP: Sherrie Mustache, MD  Assessment/Plan: Acute on chronic diastolic CHF  -02/77/4128 echocardiogram--EF 60-65%  -Continue intravenous furosemide every 12 hours  -10/07/2013 discharge weight--112 kg  -Weight on admission 119.7 kg  -Daily weights  -Question outpatient compliance  -change to po lasix after today Atypical chest pain  -Troponins negative x3  -EKG--RBBB unchanged  Transaminasemia  -Abdominal ultrasound remarkable  -Likely due to hepatic congestion from CHF  -Continue to trend  -LFTs in am Diabetes mellitus type 2, uncontrolled  -Hemoglobin A1c 9.9 on 10/02/2013  -NovoLog sliding scale  -Monitor CBGs and adjust accordingly  -Increase Lantus to 30 units daily Atrial fibrillation  -Rate controlled  -Continue warfarin  -Continue carvedilol and diltiazem  CKD stage IV  -Serum creatinine at baseline  -Continue to monitor  -Baseline creatinine 2.2-2.6  Hyperlipidemia  -Continue statin  Coronary artery disease  -Stable  -Continue Imdur  Family Communication: updated daughter on phone Dispo--SNF         Procedures/Studies: Dg Chest 2 View  10/30/2013   CLINICAL DATA:  Shortness of breath, cough for 4 days, history hypertension, diabetes, coronary artery disease post MI  EXAM: CHEST  2 VIEW  COMPARISON:  10/04/2013  FINDINGS: Enlargement of cardiac silhouette with pulmonary vascular congestion.  Atherosclerotic calcification aorta.  Linear density projects over LEFT upper lobe on the PA view not identified on lateral view, likely superimposed artifact.  No acute infiltrate, pleural effusion or pneumothorax.  Bones demineralized.  IMPRESSION: Enlargement of cardiac silhouette with pulmonary vascular congestion.  No acute abnormalities.   Electronically Signed   By: Lavonia Dana M.D.   On: 10/30/2013 21:43   US Abdomen Complete  10/31/2013   CLINICAL DATA:  Abnormal liver  function tests  EXAM: ULTRASOUND ABDOMEN COMPLETE  COMPARISON:  11/01/2012  FINDINGS: Gallbladder:  No gallstones or wall thickening visualized. No sonographic Murphy sign noted.  Common bile duct:  Diameter: 4 mm in caliber.  Liver:  No focal lesion identified. Within normal limits in parenchymal echogenicity.  IVC:  No abnormality visualized.  Pancreas:  Visualized portion unremarkable.  Spleen:  Size and appearance within normal limits.  Right Kidney:  Length: 10.7 cm in length. Echogenicity within normal limits. No mass or hydronephrosis visualized. Moderate cortical thinning.  Left Kidney:  Length: 9.5 cm in length. Echogenicity within normal limits. No mass or hydronephrosis visualized. Mild cortical thinning.  Abdominal aorta:  No aneurysm visualized.  Other findings:  None.  IMPRESSION: No acute abdominal pathology.   Electronically Signed   By: Maryclare Bean M.D.   On: 10/31/2013 15:16   Dg Swallowing Function  10/05/2013   CLINICAL DATA:  73 year old female with possible aspiration. Dysphagia.  EXAM: MODIFIED BARIUM SWALLOW  TECHNIQUE: Different consistencies of barium were administered orally to the patient by the Speech Pathologist. Imaging of the pharynx was performed in the lateral projection.  FLUOROSCOPY TIME:  1 min 18 seconds  COMPARISON:  None.  FINDINGS: With thin preparation of barium, early pharyngeal spillover and laryngeal penetration noted without aspiration.  With thick preparation of barium, mild early pharyngeal spillover and laryngeal penetration noted without aspiration.  With solid and pill preparations of barium, normal oropharyngeal transport noted without aspiration.  Normal epiglottic inversion with all consistencies noted.  IMPRESSION: Early pharyngeal spillover and laryngeal penetration with thick and thin preparations of barium. No evidence of aspiration.  Please refer to the Speech  Pathologists report for complete details and recommendations.   Electronically Signed   By: Hassan Rowan M.D.   On: 10/05/2013 18:24   Dg Chest Port 1 View  10/04/2013   CLINICAL DATA:  Dyspnea.  Worsening CHF.  Cough.  EXAM: PORTABLE CHEST - 1 VIEW  COMPARISON:  DG CHEST 1 VIEW dated 10/02/2013  FINDINGS: Shallow inspiration. Cardiac enlargement with mild pulmonary vascular congestion. Residual but improving infiltration in the right lung base.  IMPRESSION: Cardiac enlargement with pulmonary vascular congestion. No edema. Improving infiltrates in the bases with some residual on the right.   Electronically Signed   By: Lucienne Capers M.D.   On: 10/04/2013 06:28         Subjective: Patient is breathing better but still has some dyspnea with exertion. Denies any fevers, chills, chest pain, nausea, vomiting, diarrhea, abdominal pain  Objective: Filed Vitals:   11/01/13 0126 11/01/13 0139 11/01/13 0606 11/01/13 0934  BP:   116/48   Pulse:   77   Temp:   97.7 F (36.5 C)   TempSrc:   Oral   Resp:   20   Height:      Weight: 112.129 kg (247 lb 3.2 oz)     SpO2:  97% 96% 97%    Intake/Output Summary (Last 24 hours) at 11/01/13 1242 Last data filed at 11/01/13 1034  Gross per 24 hour  Intake    363 ml  Output   2250 ml  Net  -1887 ml   Weight change: -7.62 kg (-16 lb 12.8 oz) Exam:   General:  Pt is alert, follows commands appropriately, not in acute distress  HEENT: No icterus, No thrush, [place]/AT  Cardiovascular: IRRR, S1/S2, no rubs, no gallops  Respiratory: Bibasilar crackles but no wheezing. Good air movement  Abdomen: Soft/+BS, non tender, non distended, no guarding  Extremities: 2+LE edema, No lymphangitis, No petechiae, No rashes, no synovitis  Data Reviewed: Basic Metabolic Panel:  Recent Labs Lab 10/30/13 2010 10/31/13 0450 11/01/13 0539  NA 139 139 142  K 4.7 4.4 4.5  CL 104 105 104  CO2 22 21 22   GLUCOSE 183* 160* 151*  BUN 87* 80* 77*  CREATININE 2.45* 2.29* 2.60*  CALCIUM 8.5 8.4 8.7   Liver Function Tests:  Recent Labs Lab 10/30/13 2010  10/31/13 0450  AST 189* 136*  ALT 162* 136*  ALKPHOS 93 85  BILITOT 0.3 0.3  PROT 7.1 6.7  ALBUMIN 2.9* 2.7*   No results found for this basename: LIPASE, AMYLASE,  in the last 168 hours No results found for this basename: AMMONIA,  in the last 168 hours CBC:  Recent Labs Lab 10/30/13 2010 10/31/13 0450  WBC 6.2 6.3  NEUTROABS 2.9  --   HGB 10.0* 9.6*  HCT 31.4* 30.5*  MCV 92.6 92.7  PLT 140* 122*   Cardiac Enzymes:  Recent Labs Lab 10/31/13 0450 10/31/13 1059 10/31/13 1515  TROPONINI <0.30 <0.30 <0.30   BNP: No components found with this basename: POCBNP,  CBG:  Recent Labs Lab 10/31/13 1156 10/31/13 1619 10/31/13 2209 11/01/13 0804 11/01/13 1202  GLUCAP 267* 219* 234* 159* 160*    No results found for this or any previous visit (from the past 240 hour(s)).   Scheduled Meds: . budesonide (PULMICORT) nebulizer solution  0.25 mg Nebulization BID  . carvedilol  6.25 mg Oral BID WC  . diltiazem  60 mg Oral BID  . ferrous sulfate  325 mg Oral BID WC  . furosemide  40 mg Intravenous Q12H  . insulin aspart  0-15 Units Subcutaneous TID WC  . insulin aspart  0-5 Units Subcutaneous QHS  . [START ON 11/02/2013] insulin glargine  30 Units Subcutaneous Daily  . isosorbide mononitrate  30 mg Oral Daily  . levalbuterol  0.63 mg Nebulization Q6H  . loratadine  10 mg Oral Daily  . pregabalin  150 mg Oral BID  . sodium chloride  3 mL Intravenous Q12H  . sodium chloride  3 mL Intravenous Q12H  . [START ON 11/06/2013] Vitamin D (Ergocalciferol)  50,000 Units Oral Q Thu  . warfarin  5 mg Oral q1800  . Warfarin - Pharmacist Dosing Inpatient   Does not apply q1800   Continuous Infusions:    Geraldo Docker  [facility] Pager [phone]  If 7PM-7AM, please contact night-coverage [email or web address] Password [email or web address] 11/01/2013, 12:42 PM   LOS: 2 days

## 2013-11-02 DIAGNOSIS — R740 Nonspecific elevation of levels of transaminase and lactic acid dehydrogenase [LDH]: Secondary | ICD-10-CM

## 2013-11-02 DIAGNOSIS — R7401 Elevation of levels of liver transaminase levels: Secondary | ICD-10-CM

## 2013-11-02 LAB — BASIC METABOLIC PANEL
BUN: 89 mg/dL — ABNORMAL HIGH (ref 6–23)
CHLORIDE: 101 meq/L (ref 96–112)
CO2: 23 meq/L (ref 19–32)
Calcium: 8.7 mg/dL (ref 8.4–10.5)
Creatinine, Ser: 2.66 mg/dL — ABNORMAL HIGH (ref 0.50–1.10)
GFR calc non Af Amer: 17 mL/min — ABNORMAL LOW (ref >90)
GFR, EST AFRICAN AMERICAN: 20 mL/min — AB (ref >90)
Glucose, Bld: 132 mg/dL — ABNORMAL HIGH (ref 70–99)
Potassium: 4.2 mEq/L (ref 3.7–5.3)
SODIUM: 138 meq/L (ref 137–147)

## 2013-11-02 LAB — GLUCOSE, CAPILLARY
GLUCOSE-CAPILLARY: 170 mg/dL — AB (ref 70–99)
Glucose-Capillary: 148 mg/dL — ABNORMAL HIGH (ref 70–99)
Glucose-Capillary: 149 mg/dL — ABNORMAL HIGH (ref 70–99)
Glucose-Capillary: 186 mg/dL — ABNORMAL HIGH (ref 70–99)

## 2013-11-02 LAB — PROTIME-INR
INR: 1.53 — AB (ref 0.00–1.49)
PROTHROMBIN TIME: 18 s — AB (ref 11.6–15.2)

## 2013-11-02 LAB — MAGNESIUM: Magnesium: 2.1 mg/dL (ref 1.5–2.5)

## 2013-11-02 LAB — TROPONIN I
Troponin I: <0.3 ng/mL (ref <0.30)
Troponin I: <0.3 ng/mL (ref <0.30)

## 2013-11-02 MED ORDER — FUROSEMIDE 40 MG PO TABS
40.0000 mg | ORAL_TABLET | Freq: Two times a day (BID) | ORAL | Status: AC
  Administered 2013-11-03 – 2013-11-04 (×4): 40 mg via ORAL
  Filled 2013-11-02 (×5): qty 1

## 2013-11-02 MED ORDER — FUROSEMIDE 10 MG/ML IJ SOLN
INTRAMUSCULAR | Status: AC
  Filled 2013-11-02: qty 4

## 2013-11-02 MED ORDER — OFF THE BEAT BOOK
Freq: Once | Status: AC
  Administered 2013-11-02: 18:00:00
  Filled 2013-11-02: qty 1

## 2013-11-02 MED ORDER — WARFARIN SODIUM 7.5 MG PO TABS
7.5000 mg | ORAL_TABLET | Freq: Once | ORAL | Status: AC
  Administered 2013-11-02: 7.5 mg via ORAL
  Filled 2013-11-02: qty 1

## 2013-11-02 MED ORDER — LEVALBUTEROL HCL 0.63 MG/3ML IN NEBU
0.6300 mg | INHALATION_SOLUTION | Freq: Two times a day (BID) | RESPIRATORY_TRACT | Status: DC
  Administered 2013-11-02 – 2013-11-03 (×3): 0.63 mg via RESPIRATORY_TRACT
  Filled 2013-11-02 (×6): qty 3

## 2013-11-02 MED ORDER — FUROSEMIDE 10 MG/ML IJ SOLN
40.0000 mg | Freq: Once | INTRAMUSCULAR | Status: AC
  Administered 2013-11-02: 40 mg via INTRAVENOUS

## 2013-11-02 NOTE — Evaluation (Signed)
Physical Therapy Evaluation Patient Details Name: [name] MRN: [id number] DOB: July 17, 1940 Today's Date: 11/02/2013   History of Present Illness    This 73 year old female in hospital with acute on chronic congestive heart failure and atypical chest pain. She has had negative troponins earlier in this hospitalization. She complained of 5/10 chest pain to nursing and a 12-lead EKG was obtained. EKG does not look significantly different from previous and does not look consistent with acute ischemia. Came to the bedside to see the patient and she describes 5/10 pain epigastric/low sternal area with relief following one sublingual nitroglycerin.    Clinical Impression  Pt presents with significant limitations to functional mobility in setting of frequent/regular rehospitalizations and injurious falls.  Per daughter, pt has difficulty managing medications especially for diabetic control and is not adherent to dietary recommendations.  Pt lives with son who works and pt alone days.  Given multiple issues and poor mobility, recommend SNF at d/c for nursing care, medication teaching, and PT to address balance/mobility in effort to reduce fall risk, injury risk and rehospitalization risk.  Pt agreeable though prefers going home.  PT will follow acutely to initiate therapy in anticipation of SNF d/c.  Thanks.    Follow Up Recommendations SNF    Equipment Recommendations  None recommended by PT    Recommendations for Other Services       Precautions / Restrictions Precautions Precautions: Fall      Mobility  Bed Mobility Overal bed mobility: Modified Independent                Transfers Overall transfer level: Needs assistance Equipment used: Rolling walker (2 wheeled) Transfers: Sit to/from Stand Sit to Stand: Supervision;Min guard         General transfer comment: pt slightly impulsive but easily redirected for safety.  needs standby assist especially to align with sitting  surface and to control sitting speed  Ambulation/Gait Ambulation/Gait assistance: Min assist Ambulation Distance (Feet): 60 Feet Assistive device: Rolling walker (2 wheeled) Gait Pattern/deviations: Step-through pattern;Shuffle;Trunk flexed Gait velocity: decr Gait velocity interpretation: Below normal speed for age/gender General Gait Details: pt tends to lean forward and therefore walker moves further away, needs frequent cues to correct.  poor judgement as pt tries to walk too far and  becomes fatigued.  gait is slow and Sports administrator Rankin (Stroke Patients Only)       Balance Overall balance assessment: History of Falls                                           Pertinent Vitals/Pain no apparent distress     Home Living Family/patient expects to be discharged to:: Private residence Living Arrangements: Children Available Help at Discharge: Family;Available PRN/intermittently Type of Home: House Home Access: Ramped entrance     Home Layout: One level Home Equipment: Walker - 2 wheels;Wheelchair - manual;Cane - single point;Shower seat;Bedside commode Additional Comments: patient has shower chair, RW, BSC at home     Prior Function Level of Independence: Independent with assistive device(s)         Comments: son stays with pt but works days and daughter arrived at end of session to report pt has had several falls and has problems managing meds and does not follow dietary recommendations  Hand Dominance   Dominant Hand: Right    Extremity/Trunk Assessment   Upper Extremity Assessment: Defer to OT evaluation           Lower Extremity Assessment: Generalized weakness         Communication   Communication: No difficulties  Cognition Arousal/Alertness: Awake/alert Behavior During Therapy: WFL for tasks assessed/performed Overall Cognitive Status: Within Functional Limits for  tasks assessed (perhaps poor historian as daughter gave different fall hx)                      General Comments      Exercises        Assessment/Plan    PT Assessment Patient needs continued PT services  PT Diagnosis Difficulty walking   PT Problem List Obesity;Cardiopulmonary status limiting activity;Decreased safety awareness;Decreased knowledge of use of DME;Decreased mobility;Decreased balance;Decreased activity tolerance  PT Treatment Interventions Patient/family education;Balance training;Therapeutic exercise;Therapeutic activities;Functional mobility training;Gait training;DME instruction   PT Goals (Current goals can be found in the Care Plan section) Acute Rehab PT Goals Patient Stated Goal: not go to a nursing home, but also not fall anymore PT Goal Formulation: With patient/family Time For Goal Achievement: 11/16/13 Potential to Achieve Goals: Fair    Frequency Min 2X/week   Barriers to discharge Decreased caregiver support      Co-evaluation               End of Session Equipment Utilized During Treatment: Oxygen Activity Tolerance: Patient limited by fatigue Patient left: in bed;with call bell/phone within reach;with family/visitor present Nurse Communication: Mobility status         Time: 2119-4174 PT Time Calculation (min): 45 min   Charges:   PT Evaluation $Initial PT Evaluation Tier I: 1 Procedure PT Treatments $Gait Training: 8-22 mins $Therapeutic Activity: 8-22 mins   PT G Codes:          Herbie Drape 11/02/2013, 5:52 PM

## 2013-11-02 NOTE — Progress Notes (Signed)
PROGRESS NOTE  [name] ZOX:[id number] DOB: 12/16/1940 DOA: 10/30/2013 PCP: Sherrie Mustache, MD  Assessment/Plan: Acute on chronic diastolic CHF  -81/19/1478 echocardiogram--EF 60-65%  -10/07/2013 discharge weight--112 kg  -Weight on admission 119.7 kg  -Daily weights  -Question outpatient compliance  -redose furosemide IV today  Atypical chest pain  -Troponins negative x3  -EKG--RBBB unchanged  Transaminasemia  -Abdominal ultrasound remarkable  -Likely due to hepatic congestion from CHF  -Continue to trend  -LFTs in am  Diabetes mellitus type 2, uncontrolled  -Hemoglobin A1c 9.9 on 10/02/2013  -NovoLog sliding scale  -Monitor CBGs and adjust accordingly  -Increase Lantus to 30 units daily  Atrial fibrillation  -Rate controlled  -Continue warfarin  -Continue carvedilol and diltiazem  CKD stage IV  -Serum creatinine at baseline  -Continue to monitor  -Baseline creatinine 2.2-2.6  Hyperlipidemia  -Continue statin  Coronary artery disease  -Stable  -Continue Imdur  Family Communication: updated daughter on phone  Dispo--SNF          Procedures/Studies: Dg Chest 2 View  10/30/2013   CLINICAL DATA:  Shortness of breath, cough for 4 days, history hypertension, diabetes, coronary artery disease post MI  EXAM: CHEST  2 VIEW  COMPARISON:  10/04/2013  FINDINGS: Enlargement of cardiac silhouette with pulmonary vascular congestion.  Atherosclerotic calcification aorta.  Linear density projects over LEFT upper lobe on the PA view not identified on lateral view, likely superimposed artifact.  No acute infiltrate, pleural effusion or pneumothorax.  Bones demineralized.  IMPRESSION: Enlargement of cardiac silhouette with pulmonary vascular congestion.  No acute abnormalities.   Electronically Signed   By: Lavonia Dana M.D.   On: 10/30/2013 21:43   US Abdomen Complete  10/31/2013   CLINICAL DATA:  Abnormal liver function tests  EXAM: ULTRASOUND ABDOMEN COMPLETE   COMPARISON:  11/01/2012  FINDINGS: Gallbladder:  No gallstones or wall thickening visualized. No sonographic Murphy sign noted.  Common bile duct:  Diameter: 4 mm in caliber.  Liver:  No focal lesion identified. Within normal limits in parenchymal echogenicity.  IVC:  No abnormality visualized.  Pancreas:  Visualized portion unremarkable.  Spleen:  Size and appearance within normal limits.  Right Kidney:  Length: 10.7 cm in length. Echogenicity within normal limits. No mass or hydronephrosis visualized. Moderate cortical thinning.  Left Kidney:  Length: 9.5 cm in length. Echogenicity within normal limits. No mass or hydronephrosis visualized. Mild cortical thinning.  Abdominal aorta:  No aneurysm visualized.  Other findings:  None.  IMPRESSION: No acute abdominal pathology.   Electronically Signed   By: Maryclare Bean M.D.   On: 10/31/2013 15:16   Dg Swallowing Function  10/05/2013   CLINICAL DATA:  73 year old female with possible aspiration. Dysphagia.  EXAM: MODIFIED BARIUM SWALLOW  TECHNIQUE: Different consistencies of barium were administered orally to the patient by the Speech Pathologist. Imaging of the pharynx was performed in the lateral projection.  FLUOROSCOPY TIME:  1 min 18 seconds  COMPARISON:  None.  FINDINGS: With thin preparation of barium, early pharyngeal spillover and laryngeal penetration noted without aspiration.  With thick preparation of barium, mild early pharyngeal spillover and laryngeal penetration noted without aspiration.  With solid and pill preparations of barium, normal oropharyngeal transport noted without aspiration.  Normal epiglottic inversion with all consistencies noted.  IMPRESSION: Early pharyngeal spillover and laryngeal penetration with thick and thin preparations of barium. No evidence of aspiration.  Please refer to the Speech Pathologists report for complete  details and recommendations.   Electronically Signed   By: Hassan Rowan M.D.   On: 10/05/2013 18:24   Dg Chest Port 1  View  10/04/2013   CLINICAL DATA:  Dyspnea.  Worsening CHF.  Cough.  EXAM: PORTABLE CHEST - 1 VIEW  COMPARISON:  DG CHEST 1 VIEW dated 10/02/2013  FINDINGS: Shallow inspiration. Cardiac enlargement with mild pulmonary vascular congestion. Residual but improving infiltration in the right lung base.  IMPRESSION: Cardiac enlargement with pulmonary vascular congestion. No edema. Improving infiltrates in the bases with some residual on the right.   Electronically Signed   By: Lucienne Capers M.D.   On: 10/04/2013 06:28         Subjective: Patient is breathing better, but there remains some dyspnea on exertion. Patient denies fevers, chills, headache, chest pain,  nausea, vomiting, diarrhea, abdominal pain, dysuria, hematuria   Objective: Filed Vitals:   11/01/13 2002 11/01/13 2153 11/02/13 0500 11/02/13 1418  BP:  147/55 135/47 127/77  Pulse:   70 63  Temp:   98 F (36.7 C) 98.1 F (36.7 C)  TempSrc:    Oral  Resp:   20 20  Height:      Weight:   115.486 kg (254 lb 9.6 oz)   SpO2: 97%  99% 100%    Intake/Output Summary (Last 24 hours) at 11/02/13 1737 Last data filed at 11/02/13 1012  Gross per 24 hour  Intake    183 ml  Output   1750 ml  Net  -1567 ml   Weight change: 3.357 kg (7 lb 6.4 oz) Exam:   General:  Pt is alert, follows commands appropriately, not in acute distress  HEENT: No icterus, No thrush, No neck mass, [place]/AT  Cardiovascular: RRR, S1/S2, no rubs, no gallops  Respiratory: Bibasilar crackles. No wheezing. Good air movement  Abdomen: Soft/+BS, non tender, non distended, no guarding  Extremities: 1+LE edema, No lymphangitis, No petechiae, No rashes, no synovitis  Data Reviewed: Basic Metabolic Panel:  Recent Labs Lab 10/30/13 2010 10/31/13 0450 11/01/13 0539 11/02/13 0455  NA 139 139 142 138  K 4.7 4.4 4.5 4.2  CL 104 105 104 101  CO2 22 21 22 23   GLUCOSE 183* 160* 151* 132*  BUN 87* 80* 77* 89*  CREATININE 2.45* 2.29* 2.60* 2.66*  CALCIUM 8.5  8.4 8.7 8.7  MG  --   --   --  2.1   Liver Function Tests:  Recent Labs Lab 10/30/13 2010 10/31/13 0450  AST 189* 136*  ALT 162* 136*  ALKPHOS 93 85  BILITOT 0.3 0.3  PROT 7.1 6.7  ALBUMIN 2.9* 2.7*   No results found for this basename: LIPASE, AMYLASE,  in the last 168 hours No results found for this basename: AMMONIA,  in the last 168 hours CBC:  Recent Labs Lab 10/30/13 2010 10/31/13 0450  WBC 6.2 6.3  NEUTROABS 2.9  --   HGB 10.0* 9.6*  HCT 31.4* 30.5*  MCV 92.6 92.7  PLT 140* 122*   Cardiac Enzymes:  Recent Labs Lab 10/31/13 1059 10/31/13 1515 11/01/13 2320 11/02/13 0455 11/02/13 0902  TROPONINI <0.30 <0.30 <0.30 <0.30 <0.30   BNP: No components found with this basename: POCBNP,  CBG:  Recent Labs Lab 11/01/13 1648 11/01/13 1959 11/02/13 0824 11/02/13 1137 11/02/13 1700  GLUCAP 217* 194* 170* 148* 149*    No results found for this or any previous visit (from the past 240 hour(s)).   Scheduled Meds: . budesonide (PULMICORT) nebulizer solution  0.25 mg Nebulization BID  . carvedilol  6.25 mg Oral BID WC  . diltiazem  60 mg Oral BID  . ferrous sulfate  325 mg Oral BID WC  . furosemide  40 mg Oral BID  . insulin aspart  0-15 Units Subcutaneous TID WC  . insulin aspart  0-5 Units Subcutaneous QHS  . insulin glargine  30 Units Subcutaneous Daily  . isosorbide mononitrate  30 mg Oral Daily  . levalbuterol  0.63 mg Nebulization BID  . loratadine  10 mg Oral Daily  . off the beat book   Does not apply Once  . pregabalin  150 mg Oral BID  . sodium chloride  3 mL Intravenous Q12H  . sodium chloride  3 mL Intravenous Q12H  . [START ON 11/06/2013] Vitamin D (Ergocalciferol)  50,000 Units Oral Q Thu  . warfarin  7.5 mg Oral ONCE-1800  . Warfarin - Pharmacist Dosing Inpatient   Does not apply q1800   Continuous Infusions:    Geraldo Docker  [facility] Pager [phone]  If 7PM-7AM, please contact night-coverage [email or web address] Password  [facility] 11/02/2013, 5:37 PM   LOS: 3 days

## 2013-11-02 NOTE — Progress Notes (Signed)
ANTICOAGULATION CONSULT NOTE - Follow up  Pharmacy Consult for Coumadin Indication: atrial fibrillation  Allergies  Allergen Reactions  . Codeine Other (See Comments)    Childhood allergy    Patient Measurements: Height: 5\' 3"  (160 cm) Weight: 254 lb 9.6 oz (115.486 kg) IBW/kg (Calculated) : 52.4  Vital Signs: Temp: 98 F (36.7 C) (05/31 0500) BP: 135/47 mmHg (05/31 0500) Pulse Rate: 70 (05/31 0500)  Labs:  Recent Labs  10/30/13 2010 10/31/13 0110 10/31/13 0450  11/01/13 0539 11/01/13 2320 11/02/13 0455 11/02/13 0902  HGB 10.0*  --  9.6*  --   --   --   --   --   HCT 31.4*  --  30.5*  --   --   --   --   --   PLT 140*  --  122*  --   --   --   --   --   APTT  --  46*  --   --   --   --   --   --   LABPROT  --  26.1*  --   --  17.9*  --  18.0*  --   INR  --  2.49*  --   --  1.52*  --  1.53*  --   CREATININE 2.45*  --  2.29*  --  2.60*  --  2.66*  --   TROPONINI  --   --  <0.30  < >  --  <0.30 <0.30 <0.30  < > = values in this interval not displayed.  Estimated Creatinine Clearance: 23.4 ml/min (by C-G formula based on Cr of 2.66).   Medical History: Past Medical History  Diagnosis Date  . Diabetes mellitus   . Hypertension   . Hyperlipemia   . Fibromyalgia   . Obesity   . Sleep apnea     No CPAP  . Anxiety   . Hiatal hernia   . NSTEMI (non-ST elevated myocardial infarction) 04/27/2011  . Anemia 04/27/2011  . Restless leg syndrome   . CAD (coronary artery disease)     small dominant right coronary artery with diffuse 95% stenosis. The LAD had 70% stenosis in the mid vessel beyond the diagonal branch but was a small vessel. Circumflex had proximal 30% stenosis.   Marland Kitchen External hemorrhoids   . Arrhythmia 10/11/2011  . Community acquired pneumonia 06/24/2012  . Renal insufficiency 05/06/2011  . Shingles   . CHF exacerbation 10/02/2013    Medications:  Prescriptions prior to admission  Medication Sig Dispense Refill  . atorvastatin (LIPITOR) 80 MG tablet  Take 80 mg by mouth daily.      . carvedilol (COREG) 6.25 MG tablet Take 6.25 mg by mouth 2 (two) times daily with a meal.      . cetirizine (ZYRTEC) 10 MG tablet Take 10 mg by mouth daily as needed for allergies.       . Cyanocobalamin 500 MCG SUBL Place 1 tablet under the tongue daily.      Marland Kitchen diltiazem (CARDIZEM SR) 60 MG 12 hr capsule Take 1 capsule (60 mg total) by mouth 2 (two) times daily.  30 capsule  0  . ferrous sulfate 325 (65 FE) MG tablet Take 325 mg by mouth 2 (two) times daily.      . insulin aspart (NOVOLOG) 100 UNIT/ML injection Inject 0-40 Units into the skin 3 (three) times daily before meals.       . insulin glargine (LANTUS) 100 UNIT/ML injection Inject 0.5  mLs (50 Units total) into the skin at bedtime.  20 mL  11  . isosorbide mononitrate (IMDUR) 30 MG 24 hr tablet Take 30 mg by mouth daily.      . nitroGLYCERIN (NITROSTAT) 0.4 MG SL tablet Place 1 mg under the tongue every 5 (five) minutes x 3 doses as needed for chest pain. For chest pain      . pregabalin (LYRICA) 150 MG capsule Take 150 mg by mouth 2 (two) times daily.      Marland Kitchen senna-docusate (SENOKOT-S) 8.6-50 MG per tablet Take 1 tablet by mouth at bedtime as needed for mild constipation.  30 tablet  0  . torsemide (DEMADEX) 20 MG tablet Take 2 tablets (40 mg total) by mouth daily.  60 tablet  0  . Vitamin D, Ergocalciferol, (DRISDOL) 50000 UNITS CAPS capsule Take 50,000 Units by mouth every Thursday.      . warfarin (COUMADIN) 5 MG tablet Take 1 tablet (5 mg total) by mouth one time only at 6 PM.  15 tablet  0    Assessment: 73 yo female admitted with cough/chest pain.  Continuing coumadin for h/o afib.  INR today = 1.53. INR only slightly increased from 1.52 yesterday (2.49 on 5/29).    PTA coumadin dose is 5 mg daily, reported last taken on 5/28 prior to admission. We gave 5mg  and 7.5mg  on past 2 days in hospital. Last CBC done 10/31/13.  No bleeding noted.  Troponins were neg x 3, EKG--RBBB unchanged.   Today she  complained of 5/10 chest pain. Per MD- Pain resolved after one sublingual nitroglycerin. EKG looks unchanged and does not look suggestive of acute ischemia. MD's plan is to obtain further troponins now then every 6 hours for a total of 3 sets; and repeating  EKG.      Goal of Therapy:  INR 2-3 Monitor platelets by anticoagulation protocol: Yes   Plan:  Give Coumadin 7.5 mg today x1. Daily INR  Nicole Cella, RPh Clinical Pharmacist Pager: [phone] 11/02/2013,12:19 PM

## 2013-11-03 ENCOUNTER — Telehealth: Payer: Self-pay | Admitting: *Deleted

## 2013-11-03 ENCOUNTER — Encounter: Payer: Self-pay | Admitting: *Deleted

## 2013-11-03 DIAGNOSIS — J9610 Chronic respiratory failure, unspecified whether with hypoxia or hypercapnia: Secondary | ICD-10-CM | POA: Diagnosis present

## 2013-11-03 LAB — BASIC METABOLIC PANEL
BUN: 88 mg/dL — AB (ref 6–23)
CO2: 24 mEq/L (ref 19–32)
Calcium: 8.5 mg/dL (ref 8.4–10.5)
Chloride: 102 mEq/L (ref 96–112)
Creatinine, Ser: 2.9 mg/dL — ABNORMAL HIGH (ref 0.50–1.10)
GFR calc Af Amer: 18 mL/min — ABNORMAL LOW (ref >90)
GFR, EST NON AFRICAN AMERICAN: 15 mL/min — AB (ref >90)
GLUCOSE: 79 mg/dL (ref 70–99)
Potassium: 4.2 mEq/L (ref 3.7–5.3)
SODIUM: 141 meq/L (ref 137–147)

## 2013-11-03 LAB — GLUCOSE, CAPILLARY
GLUCOSE-CAPILLARY: 170 mg/dL — AB (ref 70–99)
Glucose-Capillary: 79 mg/dL (ref 70–99)

## 2013-11-03 LAB — HEPATIC FUNCTION PANEL
ALT: 54 U/L — AB (ref 0–35)
AST: 25 U/L (ref 0–37)
Albumin: 2.4 g/dL — ABNORMAL LOW (ref 3.5–5.2)
Alkaline Phosphatase: 70 U/L (ref 39–117)
BILIRUBIN TOTAL: 0.3 mg/dL (ref 0.3–1.2)
Total Protein: 6.1 g/dL (ref 6.0–8.3)

## 2013-11-03 LAB — PROTIME-INR
INR: 1.57 — ABNORMAL HIGH (ref 0.00–1.49)
PROTHROMBIN TIME: 18.3 s — AB (ref 11.6–15.2)

## 2013-11-03 MED ORDER — WARFARIN SODIUM 7.5 MG PO TABS
7.5000 mg | ORAL_TABLET | Freq: Once | ORAL | Status: AC
  Administered 2013-11-03: 7.5 mg via ORAL
  Filled 2013-11-03: qty 1

## 2013-11-03 MED ORDER — ACETAMINOPHEN 325 MG PO TABS
650.0000 mg | ORAL_TABLET | Freq: Four times a day (QID) | ORAL | Status: DC | PRN
  Administered 2013-11-03: 650 mg via ORAL
  Filled 2013-11-03: qty 2

## 2013-11-03 MED ORDER — LEVALBUTEROL HCL 0.63 MG/3ML IN NEBU
0.6300 mg | INHALATION_SOLUTION | Freq: Three times a day (TID) | RESPIRATORY_TRACT | Status: DC
Start: 2013-11-04 — End: 2013-11-05
  Administered 2013-11-03 – 2013-11-05 (×6): 0.63 mg via RESPIRATORY_TRACT
  Filled 2013-11-03 (×10): qty 3

## 2013-11-03 MED ORDER — INSULIN ASPART 100 UNIT/ML ~~LOC~~ SOLN
3.0000 [IU] | Freq: Three times a day (TID) | SUBCUTANEOUS | Status: DC
  Administered 2013-11-03 – 2013-11-04 (×4): 3 [IU] via SUBCUTANEOUS

## 2013-11-03 NOTE — Progress Notes (Addendum)
Clinical Social Work Department CLINICAL SOCIAL WORK PLACEMENT NOTE 11/03/2013  Patient:  [name], [name]  Account Number:  [id number] Admit date:  10/30/2013  Clinical Social Worker:  Berton Mount, Latanya Presser  Date/time:  11/03/2013 02:15 PM  Clinical Social Work is seeking post-discharge placement for this patient at the following level of care:   SKILLED NURSING   (*CSW will update this form in Epic as items are completed)   11/03/2013  Patient/family provided with [facility] Department of Clinical Social Work's list of facilities offering this level of care within the geographic area requested by the patient (or if unable, by the patient's family).  11/03/2013  Patient/family informed of their freedom to choose among providers that offer the needed level of care, that participate in Medicare, Medicaid or managed care program needed by the patient, have an available bed and are willing to accept the patient.  11/03/2013  Patient/family informed of [facility]' ownership interest in [facility], as well as of the fact that they are under no obligation to receive care at this facility.  PASARR submitted to EDS on Existing PASARR number received from EDS on   FL2 transmitted to all facilities in geographic area requested by pt/family on  11/03/2013 FL2 transmitted to all facilities within larger geographic area on   Patient informed that his/her managed care company has contracts with or will negotiate with  certain facilities, including the following:     Patient/family informed of bed offers received:  11/04/13 Patient chooses bed at [facility] Physician recommends and patient chooses bed at    Patient to be transferred to [facility] on  11/05/2013 Patient to be transferred to facility by [facility]  The following physician request were entered in Epic:   Additional Comments:  [place], [place]

## 2013-11-03 NOTE — Progress Notes (Signed)
PROGRESS NOTE  [name] LGX:[id number] DOB: [birth year] DOA: 10/30/2013 PCP: Sherrie Mustache, MD  Assessment/Plan: Acute on chronic diastolic CHF  -81/44/8185 echocardiogram--EF 60-65%  -10/07/2013 discharge weight--112 kg  -Weight on admission 119.7 kg  -11/03/13 weight--112.5kg -negative 6.1L -Question outpatient compliance  -transition to po lasix as creatinine is trending up Atypical chest pain  -Troponins negative x3  -EKG--Afib with RBBB unchanged Sinus Pauses -11/01/13 had 2.1 second pause -11/02/13--2 more >2 second pauses  -may need to d/c coreg -consult cardiology Transaminasemia  -Abdominal ultrasound remarkable  -Likely due to hepatic congestion from CHF  -improving with tx of CHF Diabetes mellitus type 2, uncontrolled  -Hemoglobin A1c 9.9 on 10/02/2013  -NovoLog sliding scale  -Monitor CBGs and adjust accordingly  -Increase Lantus to 30 units daily  -add novolog 3 unit with each meal Atrial fibrillation  -Rate controlled  -Continue warfarin  -Continue carvedilol and diltiazem  CKD stage IV  -Continue to monitor  -Baseline creatinine 2.2-2.6  Hyperlipidemia  -Continue statin  Coronary artery disease  -Stable  -Continue Imdur  Family Communication: updated daughter on phone  Dispo--SNF         Procedures/Studies: Dg Chest 2 View  10/30/2013   CLINICAL DATA:  Shortness of breath, cough for 4 days, history hypertension, diabetes, coronary artery disease post MI  EXAM: CHEST  2 VIEW  COMPARISON:  10/04/2013  FINDINGS: Enlargement of cardiac silhouette with pulmonary vascular congestion.  Atherosclerotic calcification aorta.  Linear density projects over LEFT upper lobe on the PA view not identified on lateral view, likely superimposed artifact.  No acute infiltrate, pleural effusion or pneumothorax.  Bones demineralized.  IMPRESSION: Enlargement of cardiac silhouette with pulmonary vascular congestion.  No acute abnormalities.    Electronically Signed   By: Lavonia Dana M.D.   On: 10/30/2013 21:43   US Abdomen Complete  10/31/2013   CLINICAL DATA:  Abnormal liver function tests  EXAM: ULTRASOUND ABDOMEN COMPLETE  COMPARISON:  11/01/2012  FINDINGS: Gallbladder:  No gallstones or wall thickening visualized. No sonographic Murphy sign noted.  Common bile duct:  Diameter: 4 mm in caliber.  Liver:  No focal lesion identified. Within normal limits in parenchymal echogenicity.  IVC:  No abnormality visualized.  Pancreas:  Visualized portion unremarkable.  Spleen:  Size and appearance within normal limits.  Right Kidney:  Length: 10.7 cm in length. Echogenicity within normal limits. No mass or hydronephrosis visualized. Moderate cortical thinning.  Left Kidney:  Length: 9.5 cm in length. Echogenicity within normal limits. No mass or hydronephrosis visualized. Mild cortical thinning.  Abdominal aorta:  No aneurysm visualized.  Other findings:  None.  IMPRESSION: No acute abdominal pathology.   Electronically Signed   By: Maryclare Bean M.D.   On: 10/31/2013 15:16   Dg Swallowing Function  10/05/2013   CLINICAL DATA:  73 year old female with possible aspiration. Dysphagia.  EXAM: MODIFIED BARIUM SWALLOW  TECHNIQUE: Different consistencies of barium were administered orally to the patient by the Speech Pathologist. Imaging of the pharynx was performed in the lateral projection.  FLUOROSCOPY TIME:  1 min 18 seconds  COMPARISON:  None.  FINDINGS: With thin preparation of barium, early pharyngeal spillover and laryngeal penetration noted without aspiration.  With thick preparation of barium, mild early pharyngeal spillover and laryngeal penetration noted without aspiration.  With solid and pill preparations of barium, normal oropharyngeal transport noted without aspiration.  Normal epiglottic inversion with all consistencies noted.  IMPRESSION: Early pharyngeal spillover  and laryngeal penetration with thick and thin preparations of barium. No evidence of  aspiration.  Please refer to the Speech Pathologists report for complete details and recommendations.   Electronically Signed   By: Hassan Rowan M.D.   On: 10/05/2013 18:24         Subjective: Patient feels tired. She says her breathing is better. Denies fevers, chills, chest pain, nausea, vomiting, diarrhea, abdominal pain. She still has some dyspnea on exertion.  Objective: Filed Vitals:   11/02/13 2100 11/02/13 2128 11/03/13 0500 11/03/13 0631  BP: 113/43  90/35 106/61  Pulse: 87 78 105   Temp: 98.2 F (36.8 C)  98.1 F (36.7 C)   TempSrc:      Resp: 18 16 18    Height:      Weight:   112.537 kg (248 lb 1.6 oz)   SpO2: 100%  98%     Intake/Output Summary (Last 24 hours) at 11/03/13 0850 Last data filed at 11/03/13 0718  Gross per 24 hour  Intake    123 ml  Output   1800 ml  Net  -1677 ml   Weight change: -2.948 kg (-6 lb 8 oz) Exam:   General:  Pt is alert, follows commands appropriately, not in acute distress  HEENT: No icterus, No thrush,[place]/AT  Cardiovascular: IRRR, S1/S2, no rubs, no gallops  Respiratory: Bibasilar crackles but no wheezing. Good air movement.  Abdomen: Soft/+BS, non tender, non distended, no guarding  Extremities: 2+ LE edema, No lymphangitis, No petechiae, No rashes, no synovitis  Data Reviewed: Basic Metabolic Panel:  Recent Labs Lab 10/30/13 2010 10/31/13 0450 11/01/13 0539 11/02/13 0455 11/03/13 0530  NA 139 139 142 138 141  K 4.7 4.4 4.5 4.2 4.2  CL 104 105 104 101 102  CO2 22 21 22 23 24   GLUCOSE 183* 160* 151* 132* 79  BUN 87* 80* 77* 89* 88*  CREATININE 2.45* 2.29* 2.60* 2.66* 2.90*  CALCIUM 8.5 8.4 8.7 8.7 8.5  MG  --   --   --  2.1  --    Liver Function Tests:  Recent Labs Lab 10/30/13 2010 10/31/13 0450 11/03/13 0530  AST 189* 136* 25  ALT 162* 136* 54*  ALKPHOS 93 85 70  BILITOT 0.3 0.3 0.3  PROT 7.1 6.7 6.1  ALBUMIN 2.9* 2.7* 2.4*   No results found for this basename: LIPASE, AMYLASE,  in the last 168  hours No results found for this basename: AMMONIA,  in the last 168 hours CBC:  Recent Labs Lab 10/30/13 2010 10/31/13 0450  WBC 6.2 6.3  NEUTROABS 2.9  --   HGB 10.0* 9.6*  HCT 31.4* 30.5*  MCV 92.6 92.7  PLT 140* 122*   Cardiac Enzymes:  Recent Labs Lab 10/31/13 1059 10/31/13 1515 11/01/13 2320 11/02/13 0455 11/02/13 0902  TROPONINI <0.30 <0.30 <0.30 <0.30 <0.30   BNP: No components found with this basename: POCBNP,  CBG:  Recent Labs Lab 11/02/13 0824 11/02/13 1137 11/02/13 1700 11/02/13 2050 11/03/13 0741  GLUCAP 170* 148* 149* 186* 79    No results found for this or any previous visit (from the past 240 hour(s)).   Scheduled Meds: . budesonide (PULMICORT) nebulizer solution  0.25 mg Nebulization BID  . carvedilol  6.25 mg Oral BID WC  . diltiazem  60 mg Oral BID  . ferrous sulfate  325 mg Oral BID WC  . furosemide  40 mg Oral BID  . insulin aspart  0-15 Units Subcutaneous TID WC  .  insulin aspart  0-5 Units Subcutaneous QHS  . insulin glargine  30 Units Subcutaneous Daily  . isosorbide mononitrate  30 mg Oral Daily  . levalbuterol  0.63 mg Nebulization BID  . loratadine  10 mg Oral Daily  . pregabalin  150 mg Oral BID  . sodium chloride  3 mL Intravenous Q12H  . sodium chloride  3 mL Intravenous Q12H  . [START ON 11/06/2013] Vitamin D (Ergocalciferol)  50,000 Units Oral Q Thu  . Warfarin - Pharmacist Dosing Inpatient   Does not apply q1800   Continuous Infusions:    Geraldo Docker  [facility] Pager [phone]  If 7PM-7AM, please contact night-coverage [email or web address] Password [email or web address] 11/03/2013, 8:50 AM   LOS: 4 days

## 2013-11-03 NOTE — Progress Notes (Signed)
Pt having frequent greater than 2 sec pauses. Pt asymptomatic. BP stable. Fredirick Maudlin, NP made aware. No new orders. Cont to monitor.

## 2013-11-03 NOTE — Progress Notes (Signed)
Clinical Social Work Department BRIEF PSYCHOSOCIAL ASSESSMENT 11/03/2013  Patient:  [name], [name]     Account Number:  [id number]     Admit date:  10/30/2013  Clinical Social Worker:  Adair Laundry  Date/Time:  11/03/2013 01:45 PM  Referred by:  Physician  Date Referred:  11/03/2013 Referred for  SNF Placement   Other Referral:   Interview type:  Patient Other interview type:    PSYCHOSOCIAL DATA Living Status:  WITH ADULT CHILDREN Admitted from facility:   Level of care:   Primary support name:  Kendell Bane Primary support relationship to patient:  CHILD, ADULT Degree of support available:   Pt reports having a very good support system    CURRENT CONCERNS Current Concerns  Post-Acute Placement   Other Concerns:    SOCIAL WORK ASSESSMENT / PLAN CSW aware of PT recommendation for SNF. CSW visited pt room and spoke with pt about [facility]. Pt was unaware of recommendation but informed CSW she has been to SNF in the past. Pt had no questions or concerns at this time. Pt reported that she was agreeable and wanted to do whatever was being recommended. Pt stated she has been to [facility] in the past and would like to return if they will have her back. Pt asked or CSW to hold off on calling family until dc date is determined.    CSW called and left message for facility admissions to inform of pt preference.   Assessment/plan status:  Psychosocial Support/Ongoing Assessment of Needs Other assessment/ plan:   Information/referral to community resources:   SNF list denied at this time    PATIENT'S/FAMILY'S RESPONSE TO PLAN OF CARE: Pt is agreeable to SNF       [place], [place]

## 2013-11-03 NOTE — Clinical Documentation Improvement (Signed)
Possible Clinical conditions  Morbid Obesity W/ BMI 46.9 Other condition___________________ Cannot clinically determine _____________  Risk Factors:  History of obesity  Sign & Symptoms: Ht. 5'3"   Wt. 264 lbs   BMI  46.9  Treatment: Heart healthy/carb modified diet  Thank You, Estella Husk ,RN Clinical Documentation Specialist:  [place] Information Management

## 2013-11-03 NOTE — Progress Notes (Signed)
ANTICOAGULATION CONSULT NOTE - Follow Up Consult  Pharmacy Consult for Coumadin Indication: atrial fibrillation  Allergies  Allergen Reactions  . Codeine Other (See Comments)    Childhood allergy    Patient Measurements: Height: 5\' 3"  (160 cm) Weight: 248 lb 1.6 oz (112.537 kg) IBW/kg (Calculated) : 52.4 Heparin Dosing Weight:   Vital Signs: Temp: 98.1 F (36.7 C) (06/01 0500) BP: 106/61 mmHg (06/01 0631) Pulse Rate: 105 (06/01 0500)  Labs:  Recent Labs  11/01/13 0539 11/01/13 2320 11/02/13 0455 11/02/13 0902 11/03/13 0530  LABPROT 17.9*  --  18.0*  --  18.3*  INR 1.52*  --  1.53*  --  1.57*  CREATININE 2.60*  --  2.66*  --  2.90*  TROPONINI  --  <0.30 <0.30 <0.30  --     Estimated Creatinine Clearance: 21.1 ml/min (by C-G formula based on Cr of 2.9).   Medications:  Scheduled:  . budesonide (PULMICORT) nebulizer solution  0.25 mg Nebulization BID  . carvedilol  6.25 mg Oral BID WC  . diltiazem  60 mg Oral BID  . ferrous sulfate  325 mg Oral BID WC  . furosemide  40 mg Oral BID  . insulin aspart  0-15 Units Subcutaneous TID WC  . insulin aspart  0-5 Units Subcutaneous QHS  . insulin aspart  3 Units Subcutaneous TID WC  . insulin glargine  30 Units Subcutaneous Daily  . isosorbide mononitrate  30 mg Oral Daily  . levalbuterol  0.63 mg Nebulization BID  . loratadine  10 mg Oral Daily  . pregabalin  150 mg Oral BID  . sodium chloride  3 mL Intravenous Q12H  . sodium chloride  3 mL Intravenous Q12H  . [START ON 11/06/2013] Vitamin D (Ergocalciferol)  50,000 Units Oral Q Thu  . Warfarin - Pharmacist Dosing Inpatient   Does not apply q1800    Assessment: 73yo female with AFib, continued on Coumadin from home.  INR 1.57, down from therapeutic level on admit with no missed doses and no Vitamin K.  No bleeding noted.  Goal of Therapy:  INR 2-3 Monitor platelets by anticoagulation protocol: Yes   Plan:  1-  Repeat Coumadin 7.5mg  today 2-  F/U INR  Gracy Bruins, PharmD Clinical Pharmacist [facility]

## 2013-11-03 NOTE — Telephone Encounter (Signed)
I called pt and relayed the TCD results.  Mildly elevated left middle cerebral velocities suggest mild narrowing need follow up TCD in 6 months. She is now in the hospital.  I relayed that more information can be given at her appt in July 2015 with Charlott Holler, NP.   She verbalized understanding.

## 2013-11-03 NOTE — Consult Note (Signed)
CARDIOLOGY CONSULT NOTE   Patient ID: [name] MRN: [id number], DOB/AGE: Jun 02, 1941   Admit date: 10/30/2013 Date of Consult: 11/03/2013   Primary Physician: Sherrie Mustache, MD Primary Cardiologist: Dr. Percival Spanish  Pt. Profile  73 year old woman admitted with exacerbation of diastolic heart failure.  She has a history of chronic atrial fibrillation and is on long-term warfarin.  Problem List  Past Medical History  Diagnosis Date  . Diabetes mellitus   . Hypertension   . Hyperlipemia   . Fibromyalgia   . Obesity   . Sleep apnea     No CPAP  . Anxiety   . Hiatal hernia   . NSTEMI (non-ST elevated myocardial infarction) 04/27/2011  . Anemia 04/27/2011  . Restless leg syndrome   . CAD (coronary artery disease)     small dominant right coronary artery with diffuse 95% stenosis. The LAD had 70% stenosis in the mid vessel beyond the diagonal branch but was a small vessel. Circumflex had proximal 30% stenosis.   Marland Kitchen External hemorrhoids   . Arrhythmia 10/11/2011  . Community acquired pneumonia 06/24/2012  . Renal insufficiency 05/06/2011  . Shingles   . CHF exacerbation 10/02/2013    Past Surgical History  Procedure Laterality Date  . Appendectomy    . Fracture surgery      left humerous  . Ganglion cyst removal       Allergies  Allergies  Allergen Reactions  . Codeine Other (See Comments)    Childhood allergy    HPI   This 73 year old woman has a history of diastolic heart failure, coronary artery disease, chronic atrial fibrillation, chronic kidney disease, and diabetes mellitus.  She was admitted on 10/30/13 because of worsening dyspnea.  She stated at the time of admission that she had not taken her diuretics for several days prior to admission.  It was noted that her weight was up about 10 pounds from an earlier admission one month ago.  On this admission her proBNP was again elevated at 4361.  Chest x-ray showed cardiomegaly with pulmonary vascular congestion.   EKG on admission showed atrial fibrillation with a rate of 99.  Since admission she has been diuresed.  Her heart rate has now slowed and at times she is having pauses of more than 2 seconds.  She has had some atypical chest discomfort not associated with any EKG changes.  Her troponins have been normal x4.  Her most recent echocardiogram on 09/26/13 showed normal left ventricular systolic and no significant valve abnormalities.  Inpatient Medications  . budesonide (PULMICORT) nebulizer solution  0.25 mg Nebulization BID  . carvedilol  6.25 mg Oral BID WC  . diltiazem  60 mg Oral BID  . ferrous sulfate  325 mg Oral BID WC  . furosemide  40 mg Oral BID  . insulin aspart  0-15 Units Subcutaneous TID WC  . insulin aspart  0-5 Units Subcutaneous QHS  . insulin aspart  3 Units Subcutaneous TID WC  . insulin glargine  30 Units Subcutaneous Daily  . isosorbide mononitrate  30 mg Oral Daily  . levalbuterol  0.63 mg Nebulization BID  . loratadine  10 mg Oral Daily  . pregabalin  150 mg Oral BID  . sodium chloride  3 mL Intravenous Q12H  . sodium chloride  3 mL Intravenous Q12H  . [START ON 11/06/2013] Vitamin D (Ergocalciferol)  50,000 Units Oral Q Thu  . Warfarin - Pharmacist Dosing Inpatient   Does not apply q1800    Family  History Family History  Problem Relation Age of Onset  . Stomach cancer Mother   . Heart disease Maternal Grandmother   . Heart disease Maternal Grandfather   . Dementia Father      Social History History   Social History  . Marital Status: Widowed    Spouse Name: N/A    Number of Children: 4  . Years of Education: 12th   Occupational History  . Retired    Social History Main Topics  . Smoking status: Never Smoker   . Smokeless tobacco: Never Used  . Alcohol Use: No  . Drug Use: No  . Sexual Activity: No   Other Topics Concern  . Not on file   Social History Narrative   Patient lives at home with son     Review of Systems  General:  No chills,  fever, night sweats or weight changes.  Complains of fatigue and feeling sleepy Cardiovascular:  No chest pain, dyspnea on exertion, edema, orthopnea, palpitations, paroxysmal nocturnal dyspnea. Dermatological: No rash, lesions/masses Respiratory: No cough, dyspnea Urologic: No hematuria, dysuria Abdominal:   No nausea, vomiting, diarrhea, bright red blood per rectum, melena, or hematemesis Neurologic:  No visual changes, wkns, changes in mental status. All other systems reviewed and are otherwise negative except as noted above.  Physical Exam  Blood pressure 106/61, pulse 105, temperature 98.1 F (36.7 C), temperature source Oral, resp. rate 18, height 5\' 3"  (1.6 m), weight 248 lb 1.6 oz (112.537 kg), SpO2 92.00%.  General: Pleasant, NAD.  Poor historian Psych: Normal affect. Neuro: Alert and oriented X 3. Moves all extremities spontaneously. HEENT: Normal  Neck: Supple without bruits or JVD. Lungs:  Resp regular and unlabored, CTA.  Poor inspiratory effort.  Decreased breath sounds at bases. Heart: Slow irregular pulse. no s3, s4, or murmurs. Abdomen: Soft, non-tender, non-distended, BS + x 4.  Extremities: Mild peripheral edema.  No phlebitis.  Labs   Recent Labs  10/31/13 1515 11/01/13 2320 11/02/13 0455 11/02/13 0902  TROPONINI <0.30 <0.30 <0.30 <0.30   Lab Results  Component Value Date   WBC 6.3 10/31/2013   HGB 9.6* 10/31/2013   HCT 30.5* 10/31/2013   MCV 92.7 10/31/2013   PLT 122* 10/31/2013     Recent Labs Lab 11/03/13 0530  NA 141  K 4.2  CL 102  CO2 24  BUN 88*  CREATININE 2.90*  CALCIUM 8.5  PROT 6.1  BILITOT 0.3  ALKPHOS 70  ALT 54*  AST 25  GLUCOSE 79   Lab Results  Component Value Date   CHOL 96 07/02/2013   HDL 49 07/02/2013   LDLCALC 34 07/02/2013   TRIG 66 07/02/2013   No results found for this basename: DDIMER    Radiology/Studies  Dg Chest 2 View  10/30/2013   CLINICAL DATA:  Shortness of breath, cough for 4 days, history  hypertension, diabetes, coronary artery disease post MI  EXAM: CHEST  2 VIEW  COMPARISON:  10/04/2013  FINDINGS: Enlargement of cardiac silhouette with pulmonary vascular congestion.  Atherosclerotic calcification aorta.  Linear density projects over LEFT upper lobe on the PA view not identified on lateral view, likely superimposed artifact.  No acute infiltrate, pleural effusion or pneumothorax.  Bones demineralized.  IMPRESSION: Enlargement of cardiac silhouette with pulmonary vascular congestion.  No acute abnormalities.   Electronically Signed   By: Lavonia Dana M.D.   On: 10/30/2013 21:43   US Abdomen Complete  10/31/2013   CLINICAL DATA:  Abnormal liver function tests  EXAM: ULTRASOUND ABDOMEN COMPLETE  COMPARISON:  11/01/2012  FINDINGS: Gallbladder:  No gallstones or wall thickening visualized. No sonographic Murphy sign noted.  Common bile duct:  Diameter: 4 mm in caliber.  Liver:  No focal lesion identified. Within normal limits in parenchymal echogenicity.  IVC:  No abnormality visualized.  Pancreas:  Visualized portion unremarkable.  Spleen:  Size and appearance within normal limits.  Right Kidney:  Length: 10.7 cm in length. Echogenicity within normal limits. No mass or hydronephrosis visualized. Moderate cortical thinning.  Left Kidney:  Length: 9.5 cm in length. Echogenicity within normal limits. No mass or hydronephrosis visualized. Mild cortical thinning.  Abdominal aorta:  No aneurysm visualized.  Other findings:  None.  IMPRESSION: No acute abdominal pathology.   Electronically Signed   By: Maryclare Bean M.D.   On: 10/31/2013 15:16   Dg Swallowing Function  10/05/2013   CLINICAL DATA:  73 year old female with possible aspiration. Dysphagia.  EXAM: MODIFIED BARIUM SWALLOW  TECHNIQUE: Different consistencies of barium were administered orally to the patient by the Speech Pathologist. Imaging of the pharynx was performed in the lateral projection.  FLUOROSCOPY TIME:  1 min 18 seconds  COMPARISON:   None.  FINDINGS: With thin preparation of barium, early pharyngeal spillover and laryngeal penetration noted without aspiration.  With thick preparation of barium, mild early pharyngeal spillover and laryngeal penetration noted without aspiration.  With solid and pill preparations of barium, normal oropharyngeal transport noted without aspiration.  Normal epiglottic inversion with all consistencies noted.  IMPRESSION: Early pharyngeal spillover and laryngeal penetration with thick and thin preparations of barium. No evidence of aspiration.  Please refer to the Speech Pathologists report for complete details and recommendations.   Electronically Signed   By: Hassan Rowan M.D.   On: 10/05/2013 18:24    ECG  Atrial fibrillation Right bundle branch block Abnormal ECG No significant change since last tracing Confirmed by KATZ MD, JEFF ([id number]) on 11/02/2013 8:45:16 AM  2-D echo: On 09/2413 Study Conclusions  - Left ventricle: The cavity size was normal. Systolic function was normal. The estimated ejection fraction was in the range of 60% to 65%. Wall motion was normal; there were no regional wall motion abnormalities. - Mitral valve: Calcified annulus. Mildly thickened leaflets . Mild regurgitation. - Left atrium: The atrium was mildly dilated. - Tricuspid valve: Moderate regurgitation. - Pulmonary arteries: PA peak pressure: 56mm Hg (S). Impressions:  - The right ventricular systolic pressure was increased consistent with mild pulmonary hypertension.   ASSESSMENT AND PLAN 1. chronic atrial fibrillation.  Chadsvasc score is 6.  Continue long-term warfarin anticoagulation 2. atrial fibrillation with bradycardia. 3. history of coronary artery disease.  Atypical chest pain. 4. acute on chronic diastolic heart failure secondary to patient medication noncompliance. 5. diabetes mellitus 6. chronic renal insufficiency 7. poor insight into illness  Recommendation: At this point I will stop her  diltiazem to allow a faster ventricular response to her atrial fibrillation.  Continue low-dose carvedilol. Medication compliance will be important in the future going forward.  She will be going to a skilled nursing facility which will facilitate that.  Signed, Darlin Coco, MD  11/03/2013, 10:05 AM

## 2013-11-03 NOTE — Discharge Instructions (Addendum)
Information on my medicine - Coumadin®   (Warfarin) ° °This medication education was reviewed with me or my healthcare representative as part of my discharge preparation.  The pharmacist that spoke with me during my hospital stay was:  Kendra P Hiatt, RPH ° °Why was Coumadin prescribed for you? °Coumadin was prescribed for you because you have a blood clot or a medical condition that can cause an increased risk of forming blood clots. Blood clots can cause serious health problems by blocking the flow of blood to the heart, lung, or brain. Coumadin can prevent harmful blood clots from forming. °As a reminder your indication for Coumadin is:   Stroke Prevention Because Of Atrial Fibrillation ° °What test will check on my response to Coumadin? °While on Coumadin (warfarin) you will need to have an INR test regularly to ensure that your dose is keeping you in the desired range. The INR (international normalized ratio) number is calculated from the result of the laboratory test called prothrombin time (PT). ° °If an INR APPOINTMENT HAS NOT ALREADY BEEN MADE FOR YOU please schedule an appointment to have this lab work done by your health care provider within 7 days. °Your INR goal is usually a number between:  2 to 3 or your provider may give you a more narrow range like 2-2.5.  Ask your health care provider during an office visit what your goal INR is. ° °What  do you need to  know  About  COUMADIN? °Take Coumadin (warfarin) exactly as prescribed by your healthcare provider about the same time each day.  DO NOT stop taking without talking to the doctor who prescribed the medication.  Stopping without other blood clot prevention medication to take the place of Coumadin may increase your risk of developing a new clot or stroke.  Get refills before you run out. ° °What do you do if you miss a dose? °If you miss a dose, take it as soon as you remember on the same day then continue your regularly scheduled regimen the next  day.  Do not take two doses of Coumadin at the same time. ° °Important Safety Information °A possible side effect of Coumadin (Warfarin) is an increased risk of bleeding. You should call your healthcare provider right away if you experience any of the following: °  Bleeding from an injury or your nose that does not stop. °  Unusual colored urine (red or dark brown) or unusual colored stools (red or black). °  Unusual bruising for unknown reasons. °  A serious fall or if you hit your head (even if there is no bleeding). ° °Some foods or medicines interact with Coumadin® (warfarin) and might alter your response to warfarin. To help avoid this: °  Eat a balanced diet, maintaining a consistent amount of Vitamin K. °  Notify your provider about major diet changes you plan to make. °  Avoid alcohol or limit your intake to 1 drink for women and 2 drinks for men per day. °(1 drink is 5 oz. wine, 12 oz. beer, or 1.5 oz. liquor.) ° °Make sure that ANY health care provider who prescribes medication for you knows that you are taking Coumadin (warfarin).  Also make sure the healthcare provider who is monitoring your Coumadin knows when you have started a new medication including herbals and non-prescription products. ° °Coumadin® (Warfarin)  Major Drug Interactions  °Increased Warfarin Effect Decreased Warfarin Effect  °Alcohol (large quantities) °Antibiotics (esp. Septra/Bactrim, Flagyl, Cipro) °Amiodarone (Cordarone) °Aspirin (  ASA) °Cimetidine (Tagamet) °Megestrol (Megace) °NSAIDs (ibuprofen, naproxen, etc.) °Piroxicam (Feldene) °Propafenone (Rythmol SR) °Propranolol (Inderal) °Isoniazid (INH) °Posaconazole (Noxafil) Barbiturates (Phenobarbital) °Carbamazepine (Tegretol) °Chlordiazepoxide (Librium) °Cholestyramine (Questran) °Griseofulvin °Oral Contraceptives °Rifampin °Sucralfate (Carafate) °Vitamin K  ° °Coumadin® (Warfarin) Major Herbal Interactions  °Increased Warfarin Effect Decreased Warfarin Effect   °Garlic °Ginseng °Ginkgo biloba Coenzyme Q10 °Green tea °St. John’s wort   ° °Coumadin® (Warfarin) FOOD Interactions  °Eat a consistent number of servings per week of foods HIGH in Vitamin K °(1 serving = ½ cup)  °Collards (cooked, or boiled & drained) °Kale (cooked, or boiled & drained) °Mustard greens (cooked, or boiled & drained) °Parsley *serving size only = ¼ cup °Spinach (cooked, or boiled & drained) °Swiss chard (cooked, or boiled & drained) °Turnip greens (cooked, or boiled & drained)  °Eat a consistent number of servings per week of foods MEDIUM-HIGH in Vitamin K °(1 serving = 1 cup)  °Asparagus (cooked, or boiled & drained) °Broccoli (cooked, boiled & drained, or raw & chopped) °Brussel sprouts (cooked, or boiled & drained) *serving size only = ½ cup °Lettuce, raw (green leaf, endive, romaine) °Spinach, raw °Turnip greens, raw & chopped  ° °These websites have more information on Coumadin (warfarin):  www.coumadin.com; °www.ahrq.gov/consumer/coumadin.htm; ° ° °

## 2013-11-04 DIAGNOSIS — J9610 Chronic respiratory failure, unspecified whether with hypoxia or hypercapnia: Secondary | ICD-10-CM

## 2013-11-04 DIAGNOSIS — I5033 Acute on chronic diastolic (congestive) heart failure: Principal | ICD-10-CM

## 2013-11-04 DIAGNOSIS — I498 Other specified cardiac arrhythmias: Secondary | ICD-10-CM

## 2013-11-04 DIAGNOSIS — N179 Acute kidney failure, unspecified: Secondary | ICD-10-CM

## 2013-11-04 DIAGNOSIS — I2510 Atherosclerotic heart disease of native coronary artery without angina pectoris: Secondary | ICD-10-CM

## 2013-11-04 DIAGNOSIS — N189 Chronic kidney disease, unspecified: Secondary | ICD-10-CM

## 2013-11-04 LAB — GLUCOSE, CAPILLARY
GLUCOSE-CAPILLARY: 234 mg/dL — AB (ref 70–99)
GLUCOSE-CAPILLARY: 110 mg/dL — AB (ref 70–99)
Glucose-Capillary: 78 mg/dL (ref 70–99)
Glucose-Capillary: 175 mg/dL — ABNORMAL HIGH (ref 70–99)
Glucose-Capillary: 27 mg/dL — CL (ref 70–99)
Glucose-Capillary: 60 mg/dL — ABNORMAL LOW (ref 70–99)
Glucose-Capillary: 85 mg/dL (ref 70–99)
Glucose-Capillary: 171 mg/dL — ABNORMAL HIGH (ref 70–99)
Glucose-Capillary: 241 mg/dL — ABNORMAL HIGH (ref 70–99)

## 2013-11-04 LAB — BASIC METABOLIC PANEL
BUN: 84 mg/dL — ABNORMAL HIGH (ref 6–23)
CALCIUM: 8.2 mg/dL — AB (ref 8.4–10.5)
CO2: 25 mEq/L (ref 19–32)
CREATININE: 2.92 mg/dL — AB (ref 0.50–1.10)
Chloride: 103 mEq/L (ref 96–112)
GFR calc non Af Amer: 15 mL/min — ABNORMAL LOW (ref >90)
GFR, EST AFRICAN AMERICAN: 17 mL/min — AB (ref >90)
Glucose, Bld: 69 mg/dL — ABNORMAL LOW (ref 70–99)
Potassium: 4 mEq/L (ref 3.7–5.3)
Sodium: 141 mEq/L (ref 137–147)

## 2013-11-04 LAB — PROTIME-INR
INR: 1.74 — AB (ref 0.00–1.49)
PROTHROMBIN TIME: 19.8 s — AB (ref 11.6–15.2)

## 2013-11-04 MED ORDER — WARFARIN SODIUM 7.5 MG PO TABS
7.5000 mg | ORAL_TABLET | Freq: Once | ORAL | Status: AC
  Administered 2013-11-04: 7.5 mg via ORAL
  Filled 2013-11-04: qty 1

## 2013-11-04 MED ORDER — INSULIN GLARGINE 100 UNIT/ML ~~LOC~~ SOLN
20.0000 [IU] | Freq: Every day | SUBCUTANEOUS | Status: DC
  Administered 2013-11-05: 20 [IU] via SUBCUTANEOUS
  Filled 2013-11-04: qty 0.2

## 2013-11-04 MED ORDER — GLUCOSE 40 % PO GEL: 1.0000 | ORAL | Status: DC | PRN

## 2013-11-04 MED ORDER — INSULIN ASPART 100 UNIT/ML ~~LOC~~ SOLN
0.0000 [IU] | Freq: Three times a day (TID) | SUBCUTANEOUS | Status: DC
  Administered 2013-11-05 (×2): 2 [IU] via SUBCUTANEOUS

## 2013-11-04 MED ORDER — TORSEMIDE 20 MG PO TABS
40.0000 mg | ORAL_TABLET | Freq: Every day | ORAL | Status: DC
  Administered 2013-11-05: 40 mg via ORAL
  Filled 2013-11-04 (×2): qty 2

## 2013-11-04 NOTE — Progress Notes (Signed)
11-04-13 @ 1203 additional Medicare IM signed by patient and copy left on shadow chart. Ocie Cornfield Drew , RN,BSN [phone]

## 2013-11-04 NOTE — Progress Notes (Signed)
Subjective:  Feels better.  Able to walk to RN station & back w/o O2.   Slept almost flat last PM.  Swelling is better.  Objective:  Vital Signs in the last 24 hours: Temp:  [97.4 F (36.3 C)-98.3 F (36.8 C)] 98.3 F (36.8 C) (06/02 0500) Pulse Rate:  [74-90] 74 (06/02 0500) Resp:  [18-19] 18 (06/02 0500) BP: (100-141)/(44-87) 141/72 mmHg (06/02 0500) SpO2:  [92 %-100 %] 98 % (06/02 0500) Weight:  [249 lb 6.4 oz (113.127 kg)] 249 lb 6.4 oz (113.127 kg) (06/02 0500)  Intake/Output from previous day: 06/01 0701 - 06/02 0700 In: 29 [P.O.:960] Out: 2450 [Urine:2450] Intake/Output Total: -7254    Physical Exam: General appearance: alert, cooperative, appears stated age, no distress and morbidly obese Neck: no adenopathy, no carotid bruit, no JVD and difficult to assess due to body habitus Lungs: diminished - distant breath sounds due to body habitus; no W/R/R Heart: irregularly irregular rhythm and no M/R/G Abdomen: soft, non-tender; bowel sounds normal; no masses,  no organomegaly and morbidly obese Extremities: mild BLE Edema with mild chronic venous stasis changes Pulses: 2+ and symmetric Neurologic: Grossly normal  Lab Results: No results found for this basename: WBC, HGB, PLT,  in the last 72 hours  Recent Labs  11/03/13 0530 11/04/13 0543  NA 141 141  K 4.2 4.0  CL 102 103  CO2 24 25  GLUCOSE 79 69*  BUN 88* 84*  CREATININE 2.90* 2.92*    Recent Labs  11/02/13 0455 11/02/13 0902  TROPONINI <0.30 <0.30   Hepatic Function Panel  Recent Labs  11/03/13 0530  PROT 6.1  ALBUMIN 2.4*  AST 25  ALT 54*  ALKPHOS 70  BILITOT 0.3  BILIDIR <0.2  IBILI NOT CALCULATED   Imaging: no new  Cardiac Studies: No recent studies Tele: Afib - 74-90  Assessment/Plan:  Principal Problem:   Acute on chronic diastolic CHF (congestive heart failure) Active Problems:   Atrial fibrillation   Diabetes mellitus   CKD (chronic kidney disease), stage IV   DM type  2, uncontrolled, with neuropathy   Chest pain   CHF (congestive heart failure)   Chronic respiratory failure  Continues to diurese well on PO Lasix & almost to prior "dry wgt of 112kg" - was on Torsemide @ home previously.  To avoid confusion, have written to change over to home dose tomorrow.  Will need to go forward with a Sliding Scale torsemide regimen: Sliding scale Diuretic: Weigh patient upon arrival to SNF (or home once d/c'd home); then Daily in the Morning.  "Dry Weight" will be what your scale says on the day you return home. .  If pt gains > 3 pounds from dry weight: Increase the Torsemide dosing to 60 mg in the morning until weight returns to baseline dry weight. If weight gain is greater than 5 pounds in 2 days: Increased to Torsemide to 80 mg tdaily and contact the office for further assistance if weight does not go down the next day. If the weight goes down more than 3 pounds from dry weight: Hold Lasix until it returns to baseline dry weight  HR improved with CCB held.   Not on ACE-I/ARB due to CKD - is on nitrate (if BP increases off of CCB, could initiate low dose Hydralazine as OP);  BP up a bit, but stable.  As per Dr. Sherryl Barters assessment, medical adherence is key.  D/c to SNF will help. Anticipate that she will be ready for  d/c within ~24-48 hrs   LOS: 5 days    Leonie Man 11/04/2013, 8:34 AM

## 2013-11-04 NOTE — Progress Notes (Signed)
Inpatient Diabetes Program Recommendations  AACE/ADA: New Consensus Statement on Inpatient Glycemic Control (2013)  Target Ranges:  Prepandial:   less than 140 mg/dL      Peak postprandial:   less than 180 mg/dL (1-2 hours)      Critically ill patients:  140 - 180 mg/dL   Inpatient Diabetes Program Recommendations Insulin - Basal: Pt ordered lantus 30 units daily.  Fasting however slightly low at 69 mg/dL.  At this poiint I would recommend 25 units lantus Correction (SSI): Decrease to sensitive tidwc and HS as ordered Spoke with Larene Beach, RN regarding high lantus dosing as OP. Especially with consideration of CKD, Would not discharge on more than 30 units lantus and sensitive to moderate scale. Pt may be eating quite a bit more at home, however the lantus should be covering meals. Meal coverage would be a better option of 3 units tidwc  Thank you, Rosita Kea, RN, CNS, Diabetes Coordinator [phone])

## 2013-11-04 NOTE — Progress Notes (Signed)
Hypoglycemic Event  CBG: 27  Treatment: 15 GM carbohydrate snack  Symptoms: Pale and Shaky  Follow-up CBG: Time: 1742 CBG Result: 60  Possible Reasons for Event: Unknown  Comments/MD notified: Tat    Lear Ng  Remember to initiate Hypoglycemia Order Set & complete

## 2013-11-04 NOTE — Progress Notes (Signed)
ANTICOAGULATION CONSULT NOTE - Follow Up Consult  Pharmacy Consult for Coumadin Indication: atrial fibrillation  Allergies  Allergen Reactions  . Codeine Other (See Comments)    Childhood allergy    Patient Measurements: Height: 5\' 3"  (160 cm) Weight: 249 lb 6.4 oz (113.127 kg) IBW/kg (Calculated) : 52.4 Heparin Dosing Weight:   Vital Signs: Temp: 98.3 F (36.8 C) (06/02 0500) Temp src: Oral (06/02 0500) BP: 141/72 mmHg (06/02 0500) Pulse Rate: 74 (06/02 0500)  Labs:  Recent Labs  11/01/13 2320 11/02/13 0455 11/02/13 0902 11/03/13 0530 11/04/13 0543  LABPROT  --  18.0*  --  18.3* 19.8*  INR  --  1.53*  --  1.57* 1.74*  CREATININE  --  2.66*  --  2.90* 2.92*  TROPONINI <0.30 <0.30 <0.30  --   --     Estimated Creatinine Clearance: 21.1 ml/min (by C-G formula based on Cr of 2.92).   Medications:  Scheduled:  . budesonide (PULMICORT) nebulizer solution  0.25 mg Nebulization BID  . carvedilol  6.25 mg Oral BID WC  . ferrous sulfate  325 mg Oral BID WC  . furosemide  40 mg Oral BID  . insulin aspart  0-15 Units Subcutaneous TID WC  . insulin aspart  0-5 Units Subcutaneous QHS  . insulin aspart  3 Units Subcutaneous TID WC  . insulin glargine  30 Units Subcutaneous Daily  . isosorbide mononitrate  30 mg Oral Daily  . levalbuterol  0.63 mg Nebulization TID  . loratadine  10 mg Oral Daily  . pregabalin  150 mg Oral BID  . sodium chloride  3 mL Intravenous Q12H  . sodium chloride  3 mL Intravenous Q12H  . [START ON 11/05/2013] torsemide  40 mg Oral Q breakfast  . [START ON 11/06/2013] Vitamin D (Ergocalciferol)  50,000 Units Oral Q Thu  . Warfarin - Pharmacist Dosing Inpatient   Does not apply q1800    Assessment: 73yo female with AFib.  INR now trending toward goal, 1.74 this AM.  No bleeding noted, no CBC . Goal of Therapy:  INR 2-3 Monitor platelets by anticoagulation protocol: Yes   Plan:  1-  Repeat Coumadin 7.5mg  2-  F/U in AM  Gracy Bruins,  PharmD [facility]

## 2013-11-04 NOTE — Progress Notes (Signed)
PROGRESS NOTE  [name] EGB:[id number] DOB: 10-25-1940 DOA: 10/30/2013 PCP: Sherrie Mustache, MD  Interim summary 73 y.o. female with history of CHF, CAD, atrial fibrillation, chronic kidney disease, diabetes mellitus presents to the ER because of increasing shortness of breath over 3 days with chest pain. Patient's symptoms are increased on exertion. The patient was noted to be in acute on chronic diastolic CHF With proBNP 3710. The patient was started on intravenous furosemide with good clinical response. The patient has diuresed over 7 L. As a result, the patient's serum creatinine has increased to above her usual baseline of 2.2-2.6. The patient was switched over to oral furosemide and subsequently changed back to her oral torsemide home dose on 11/05/2013. Unfortunately, the patient had 4 episodes of sinus pauses slightly over 2 seconds each. Cardiology was consulted. Her diltiazem was discontinued. The patient did not experience any further pauses. The patient's dry weight is approximately 112 kg. Once cleared by cardiology, the patient will go to skilled nursing facility at [facility].  Assessment/Plan: Acute on chronic diastolic CHF  -62/69/4854 echocardiogram--EF 60-65%  -10/07/2013 discharge weight--112 kg  -Weight on admission 119.7 kg  -11/04/13 weight--113.1kg  -negative 7.4L  -Question outpatient compliance  -transition to po lasix as creatinine is trending up-->switch to home torsemide on 11/05/13 Atypical chest pain  -Troponins negative x3  -EKG--Afib with RBBB unchanged  Sinus Pauses  -11/01/13 had 2.1 second pause  -11/02/13--2 more >2 second pauses  -appreciate cardiology  -improved with d/c of diltiazem Chronic respiratory failure -Patient is normally on 2 L nasal cannula at home Transaminasemia  -Abdominal ultrasound remarkable  -Likely due to hepatic congestion from CHF  -improving with tx of CHF  Diabetes mellitus type 2, uncontrolled  -Hemoglobin  A1c 9.9 on 10/02/2013  -NovoLog sliding scale  -Monitor CBGs and adjust accordingly  -Decrease Lantus to 20 units daily due to hypoglycemia -d/c novolog 3 unit with each meal due to hypoglycemia -change back to sensitive ISS Atrial fibrillation  -Rate controlled  -Continue warfarin  -Continue carvedilol CKD stage IV  -Continue to monitor  -Baseline creatinine 2.2-2.6  -11/04/2013 serum creatinine 2.92 Hyperlipidemia  -Continue statin  Coronary artery disease  -Stable  -Continue Imdur  Family Communication: updated daughter on phone  Dispo--SNF         Procedures/Studies: Dg Chest 2 View  10/30/2013   CLINICAL DATA:  Shortness of breath, cough for 4 days, history hypertension, diabetes, coronary artery disease post MI  EXAM: CHEST  2 VIEW  COMPARISON:  10/04/2013  FINDINGS: Enlargement of cardiac silhouette with pulmonary vascular congestion.  Atherosclerotic calcification aorta.  Linear density projects over LEFT upper lobe on the PA view not identified on lateral view, likely superimposed artifact.  No acute infiltrate, pleural effusion or pneumothorax.  Bones demineralized.  IMPRESSION: Enlargement of cardiac silhouette with pulmonary vascular congestion.  No acute abnormalities.   Electronically Signed   By: Lavonia Dana M.D.   On: 10/30/2013 21:43   US Abdomen Complete  10/31/2013   CLINICAL DATA:  Abnormal liver function tests  EXAM: ULTRASOUND ABDOMEN COMPLETE  COMPARISON:  11/01/2012  FINDINGS: Gallbladder:  No gallstones or wall thickening visualized. No sonographic Murphy sign noted.  Common bile duct:  Diameter: 4 mm in caliber.  Liver:  No focal lesion identified. Within normal limits in parenchymal echogenicity.  IVC:  No abnormality visualized.  Pancreas:  Visualized portion unremarkable.  Spleen:  Size and appearance within normal  limits.  Right Kidney:  Length: 10.7 cm in length. Echogenicity within normal limits. No mass or hydronephrosis visualized. Moderate cortical  thinning.  Left Kidney:  Length: 9.5 cm in length. Echogenicity within normal limits. No mass or hydronephrosis visualized. Mild cortical thinning.  Abdominal aorta:  No aneurysm visualized.  Other findings:  None.  IMPRESSION: No acute abdominal pathology.   Electronically Signed   By: Maryclare Bean M.D.   On: 10/31/2013 15:16         Subjective: Patient denies fevers, chills, headache, chest pain, dyspnea, nausea, vomiting, diarrhea, abdominal pain, dysuria, hematuria   Objective: Filed Vitals:   11/03/13 2040 11/03/13 2042 11/04/13 0500 11/04/13 1413  BP: 100/51  141/72 113/43  Pulse: 80  74 78  Temp: 97.6 F (36.4 C)  98.3 F (36.8 C) 97.7 F (36.5 C)  TempSrc: Oral  Oral Oral  Resp: 18  18 16   Height:      Weight:   113.127 kg (249 lb 6.4 oz)   SpO2: 100% 97% 98% 98%    Intake/Output Summary (Last 24 hours) at 11/04/13 1804 Last data filed at 11/04/13 1300  Gross per 24 hour  Intake    600 ml  Output   2850 ml  Net  -2250 ml   Weight change: 0.59 kg (1 lb 4.8 oz) Exam:   General:  Pt is alert, follows commands appropriately, not in acute distress  HEENT: No icterus, No thrush,[name]/AT  Cardiovascular: IRRR, S1/S2, no rubs, no gallops  Respiratory: Bibasilar crackles. No wheezing. Good air movement the  Abdomen: Soft/+BS, non tender, non distended, no guarding  Extremities: 2+LE edema, No lymphangitis, No petechiae, No rashes, no synovitis  Data Reviewed: Basic Metabolic Panel:  Recent Labs Lab 10/31/13 0450 11/01/13 0539 11/02/13 0455 11/03/13 0530 11/04/13 0543  NA 139 142 138 141 141  K 4.4 4.5 4.2 4.2 4.0  CL 105 104 101 102 103  CO2 21 22 23 24 25   GLUCOSE 160* 151* 132* 79 69*  BUN 80* 77* 89* 88* 84*  CREATININE 2.29* 2.60* 2.66* 2.90* 2.92*  CALCIUM 8.4 8.7 8.7 8.5 8.2*  MG  --   --  2.1  --   --    Liver Function Tests:  Recent Labs Lab 10/30/13 2010 10/31/13 0450 11/03/13 0530  AST 189* 136* 25  ALT 162* 136* 54*  ALKPHOS 93 85 70   BILITOT 0.3 0.3 0.3  PROT 7.1 6.7 6.1  ALBUMIN 2.9* 2.7* 2.4*   No results found for this basename: LIPASE, AMYLASE,  in the last 168 hours No results found for this basename: AMMONIA,  in the last 168 hours CBC:  Recent Labs Lab 10/30/13 2010 10/31/13 0450  WBC 6.2 6.3  NEUTROABS 2.9  --   HGB 10.0* 9.6*  HCT 31.4* 30.5*  MCV 92.6 92.7  PLT 140* 122*   Cardiac Enzymes:  Recent Labs Lab 10/31/13 1059 10/31/13 1515 11/01/13 2320 11/02/13 0455 11/02/13 0902  TROPONINI <0.30 <0.30 <0.30 <0.30 <0.30   BNP: No components found with this basename: POCBNP,  CBG:  Recent Labs Lab 11/04/13 0732 11/04/13 1159 11/04/13 1659 11/04/13 1742 11/04/13 1759  GLUCAP 78 175* 27* 60* 85    No results found for this or any previous visit (from the past 240 hour(s)).   Scheduled Meds: . budesonide (PULMICORT) nebulizer solution  0.25 mg Nebulization BID  . carvedilol  6.25 mg Oral BID WC  . ferrous sulfate  325 mg Oral BID WC  .  insulin aspart  0-15 Units Subcutaneous TID WC  . insulin aspart  0-5 Units Subcutaneous QHS  . insulin aspart  3 Units Subcutaneous TID WC  . insulin glargine  30 Units Subcutaneous Daily  . isosorbide mononitrate  30 mg Oral Daily  . levalbuterol  0.63 mg Nebulization TID  . loratadine  10 mg Oral Daily  . pregabalin  150 mg Oral BID  . sodium chloride  3 mL Intravenous Q12H  . sodium chloride  3 mL Intravenous Q12H  . [START ON 11/05/2013] torsemide  40 mg Oral Q breakfast  . [START ON 11/06/2013] Vitamin D (Ergocalciferol)  50,000 Units Oral Q Thu  . Warfarin - Pharmacist Dosing Inpatient   Does not apply q1800   Continuous Infusions:    Geraldo Docker  [facility] Pager [phone]  If 7PM-7AM, please contact night-coverage [email or web address] Password [email or web address] 11/04/2013, 6:04 PM   LOS: 5 days

## 2013-11-04 NOTE — Progress Notes (Signed)
Hypoglycemic Event  CBG: 60  Treatment: 15 GM carbohydrate snack  Symptoms: Pale and Shaky  Follow-up CBG: Time: 1759 CBG Result:85  Possible Reasons for Event: Unknown  Comments/MD notified:Tat    Lear Ng  Remember to initiate Hypoglycemia Order Set & complete

## 2013-11-05 DIAGNOSIS — E1149 Type 2 diabetes mellitus with other diabetic neurological complication: Secondary | ICD-10-CM

## 2013-11-05 DIAGNOSIS — I499 Cardiac arrhythmia, unspecified: Secondary | ICD-10-CM

## 2013-11-05 DIAGNOSIS — E1142 Type 2 diabetes mellitus with diabetic polyneuropathy: Secondary | ICD-10-CM

## 2013-11-05 LAB — BASIC METABOLIC PANEL
BUN: 83 mg/dL — ABNORMAL HIGH (ref 6–23)
CALCIUM: 8.2 mg/dL — AB (ref 8.4–10.5)
CO2: 26 meq/L (ref 19–32)
Chloride: 99 mEq/L (ref 96–112)
Creatinine, Ser: 2.77 mg/dL — ABNORMAL HIGH (ref 0.50–1.10)
GFR calc Af Amer: 19 mL/min — ABNORMAL LOW (ref >90)
GFR calc non Af Amer: 16 mL/min — ABNORMAL LOW (ref >90)
GLUCOSE: 366 mg/dL — AB (ref 70–99)
Potassium: 4.4 mEq/L (ref 3.7–5.3)
Sodium: 137 mEq/L (ref 137–147)

## 2013-11-05 LAB — PROTIME-INR
INR: 1.93 — AB (ref 0.00–1.49)
Prothrombin Time: 21.5 seconds — ABNORMAL HIGH (ref 11.6–15.2)

## 2013-11-05 LAB — GLUCOSE, CAPILLARY
GLUCOSE-CAPILLARY: 154 mg/dL — AB (ref 70–99)
Glucose-Capillary: 276 mg/dL — ABNORMAL HIGH (ref 70–99)
Glucose-Capillary: 165 mg/dL — ABNORMAL HIGH (ref 70–99)
Glucose-Capillary: 80 mg/dL (ref 70–99)

## 2013-11-05 MED ORDER — INSULIN ASPART 100 UNIT/ML ~~LOC~~ SOLN: 0.0000 [IU] | Freq: Three times a day (TID) | SUBCUTANEOUS | Status: DC

## 2013-11-05 MED ORDER — WARFARIN SODIUM 5 MG PO TABS: 7.5000 mg | ORAL_TABLET | Freq: Once | ORAL | Status: DC

## 2013-11-05 MED ORDER — TORSEMIDE 20 MG PO TABS: 40.0000 mg | ORAL_TABLET | Freq: Every day | ORAL | Status: DC

## 2013-11-05 MED ORDER — GLUCOSE 40 % PO GEL: 1.0000 | ORAL | Status: DC | PRN

## 2013-11-05 MED ORDER — WARFARIN SODIUM 6 MG PO TABS
6.0000 mg | ORAL_TABLET | Freq: Once | ORAL | Status: AC
  Administered 2013-11-05: 6 mg via ORAL
  Filled 2013-11-05: qty 1

## 2013-11-05 MED ORDER — INSULIN GLARGINE 100 UNIT/ML ~~LOC~~ SOLN: 20.0000 [IU] | Freq: Every day | SUBCUTANEOUS | Status: DC

## 2013-11-05 MED ORDER — LEVALBUTEROL HCL 0.63 MG/3ML IN NEBU: 0.6300 mg | INHALATION_SOLUTION | Freq: Three times a day (TID) | RESPIRATORY_TRACT | Status: DC

## 2013-11-05 MED ORDER — BUDESONIDE 0.25 MG/2ML IN SUSP: 0.2500 mg | Freq: Two times a day (BID) | RESPIRATORY_TRACT | Status: DC

## 2013-11-05 NOTE — Progress Notes (Signed)
Physical Therapy Treatment Patient Details Name: [name] MRN: [id number] DOB: 1940/09/22 Today's Date: 11/05/2013    History of Present Illness Patient is a 73 yo female admitted with CHF/SOB, Afib with RVR, fall, chest pain, hyperkalemia, uncontrolled DM.    PT Comments    Pt progressing with therapy and mobility. Pt requires 2L of O2 for mobility and continues to demo overall deconditioning. Pt will benefit from post acute rehab at SNF to increase independence and address balance deficits to reduce risk of falls upon returning home.   Follow Up Recommendations  SNF     Equipment Recommendations  None recommended by PT    Recommendations for Other Services       Precautions / Restrictions Precautions Precautions: Fall Precaution Comments: multiple falls at home per pt Restrictions Weight Bearing Restrictions: No    Mobility  Bed Mobility               General bed mobility comments: not addressed; pt sitting EOB and returned to chair   Transfers Overall transfer level: Needs assistance Equipment used: Rolling walker (2 wheeled) Transfers: Sit to/from Stand Sit to Stand: Supervision         General transfer comment: supervision for cues for hand placement and safety with RW; pt with decreased safety awareness with stand to sit; cues to control descent to chair   Ambulation/Gait Ambulation/Gait assistance: Min guard Ambulation Distance (Feet): 60 Feet Assistive device: Rolling walker (2 wheeled) Gait Pattern/deviations: Step-through pattern;Decreased stride length;Wide base of support;Trunk flexed Gait velocity: decr Gait velocity interpretation: Below normal speed for age/gender General Gait Details: pt with fwd flexed trunk with mobility; cues for upright posture and safety with gt; min guard to steady with mobility; pt with SOB with ambulation; ambulating on 2L O2 at 90%    Stairs            Wheelchair Mobility    Modified Rankin (Stroke  Patients Only)       Balance Overall balance assessment: History of Falls;Needs assistance Sitting-balance support: Feet supported;No upper extremity supported Sitting balance-Leahy Scale: Good Sitting balance - Comments: sat without UE support or back support and performed LE exercises without LOB   Standing balance support: During functional activity;Bilateral upper extremity supported;No upper extremity supported Standing balance-Leahy Scale: Fair Standing balance comment: pt progressing to standing without UE; no LOB or sway noted                    Cognition Arousal/Alertness: Awake/alert Behavior During Therapy: WFL for tasks assessed/performed Overall Cognitive Status: Within Functional Limits for tasks assessed                      Exercises General Exercises - Lower Extremity Ankle Circles/Pumps: AROM;Both;10 reps;Seated Long Arc Quad: AROM;Strengthening;Both;10 reps;Seated Hip Flexion/Marching: AROM;Strengthening;Both;10 reps;Seated    General Comments        Pertinent Vitals/Pain No c/o pain; O2 sats after ambulation at 90% on 2L O2     Home Living                      Prior Function            PT Goals (current goals can now be found in the care plan section) Acute Rehab PT Goals Patient Stated Goal: not go to a nursing home, but also not fall anymore PT Goal Formulation: With patient/family Time For Goal Achievement: 11/16/13 Potential to Achieve Goals: Fair Progress towards  PT goals: Progressing toward goals    Frequency  Min 2X/week    PT Plan Current plan remains appropriate    Co-evaluation             End of Session Equipment Utilized During Treatment: Oxygen;Gait belt Activity Tolerance: Patient limited by fatigue Patient left: in chair;with nursing/sitter in room     Time: 3244-0102 PT Time Calculation (min): 13 min  Charges:  $Gait Training: 8-22 mins                    G CodesKennis Carina  [place], [place]  [phone] 11/05/2013, 3:48 PM

## 2013-11-05 NOTE — Progress Notes (Signed)
Patient is currently active with [facility] for chronic disease management services.  Patient has been engaged by a [employer].  Our community based plan of care has focused on disease management and medication procurement/adherence.  Updated written consent and confirmed her son as her authorized contact Golda Acre [phone].  She is active with [facility] disease management as well.  Patient will receive a post discharge transition of care call and will be evaluated for monthly home visits for assessments and disease process education.  Made Inpatient Case Manager aware that [facility] following. Of note, [facility] Care Management services does not replace or interfere with any services that are arranged by inpatient case management or social work.  For additional questions or referrals please contact Corliss Blacker BSN RN [facility] Liaison at [phone].

## 2013-11-05 NOTE — Progress Notes (Signed)
CSW (Clinical Education officer, museum) notified [name] of pt dc today. They informed CSW of upfront payment that will be due today. CSW spoke with pt about this and she is unable to work this out. CSW asked if pt would be willing to consider other facilities as their policies and payment requests may be different. Pt was agreeable.   CSW presented pt with new bed offers. Pt has chosen [facility]. CSW notified facility. They will be visiting pt in hospital to complete paperwork and then CSW will assist with transport. Per pt request, pt son [name] also notified of above.  [place], [place]

## 2013-11-05 NOTE — Progress Notes (Signed)
Subjective:  Feels better today. Less SOB, mild swelling  Objective:  Vital Signs in the last 24 hours: Temp:  [97.5 F (36.4 C)-98.3 F (36.8 C)] 98.1 F (36.7 C) (06/03 0855) Pulse Rate:  [78-100] 90 (06/03 0855) Resp:  [16] 16 (06/03 0454) BP: (100-132)/(43-65) 119/43 mmHg (06/03 0855) SpO2:  [97 %-100 %] 100 % (06/03 0855) Weight:  [252 lb 3.2 oz (114.397 kg)] 252 lb 3.2 oz (114.397 kg) (06/03 0454)  Intake/Output from previous day: 06/02 0701 - 06/03 0700 In: 600 [P.O.:600] Out: 2800 [Urine:2800] Intake/Output Total: -7254 Total I/O In: 3 [I.V.:3] Out: -   Physical Exam: General appearance: A&O x 3, cooperative, appears stated age, no distress and morbidly obese Neck: no adenopathy, no carotid bruit, no JVD and difficult to assess due to body habitus Lungs: diminished - distant breath sounds due to body habitus; no W/R/R Heart: irregularly irregular rhythm and no M/R/G Abdomen: soft, non-tender; bowel sounds normal; no masses,  no organomegaly and morbidly obese Extremities: mild BLE Edema with mild chronic venous stasis changes Pulses: 2+ and symmetric Neurologic: Grossly normal  Lab Results: No results found for this basename: WBC, HGB, PLT,  in the last 72 hours  Recent Labs  11/04/13 0543 11/05/13 0315  NA 141 137  K 4.0 4.4  CL 103 99  CO2 25 26  GLUCOSE 69* 366*  BUN 84* 83*  CREATININE 2.92* 2.77*   No results found for this basename: TROPONINI, CK, MB,  in the last 72 hours Hepatic Function Panel  Recent Labs  11/03/13 0530  PROT 6.1  ALBUMIN 2.4*  AST 25  ALT 54*  ALKPHOS 2  BILITOT 0.3  BILIDIR <0.2  IBILI NOT CALCULATED   Imaging: no new  Cardiac Studies: No recent studies Tele: Afib - 74-90  Assessment/Plan:  Principal Problem:   Acute on chronic diastolic CHF (congestive heart failure) Active Problems:   Atrial fibrillation   Diabetes mellitus   CKD (chronic kidney disease), stage IV   DM type 2, uncontrolled, with  neuropathy   Chest pain   CHF (congestive heart failure)   Chronic respiratory failure   Continues to diurese well on PO Lasix & almost to prior "dry wgt of 112kg" Not sure how she "gained weight" with ~1L diureses - converted to home Torsemide @ dose today.  Will need to go forward with a Sliding Scale torsemide regimen:  Sliding scale Diuretic: Weigh patient upon arrival to SNF (or home once d/c'd home); then Daily in the Morning.  "Dry Weight" will be what your scale says on the day you return home. .  If pt gains > 3 pounds from dry weight: Increase the Torsemide dosing to 60 mg in the morning until weight returns to baseline dry weight. If weight gain is greater than 5 pounds in 2 days: Increased to Torsemide to 80 mg tdaily and contact the office for further assistance if weight does not go down the next day. If the weight goes down more than 3 pounds from dry weight: Hold Lasix until it returns to baseline dry weight  HR improved with CCB held - no further "asystole pauses" short pauses with Afib are acceptable. Not on ACE-I/ARB due to CKD - is on nitrate (if BP increases off of CCB, could initiate low dose Hydralazine as OP);  BP up a bit, but stable.  As per Dr. Sherryl Barters assessment, medical adherence is key.  D/c to SNF will help. Anticipate that she will be d/c today.  LOS: 6 days    Leonie Man 11/05/2013, 10:38 AM

## 2013-11-05 NOTE — Progress Notes (Signed)
Pt transferred to [facility] in [place] via EMS. Report called to Zena Amos, LPN. Discussed discharge instructions with pt.  Eulis Canner, RN

## 2013-11-05 NOTE — Progress Notes (Signed)
F/u appt made (transition of care requested) - 12/02/13 at 2:40pm with Richardson Dopp PA-C. No sooner appts available. Raenah Murley PA-C

## 2013-11-05 NOTE — Progress Notes (Signed)
CSW (Clinical Education officer, museum) prepared pt dc packet and placed with shadow chart. CSW will arrange for  non-emergent ambulance transport as soon as PT note available to send to facility. Pt, pt family, pt nurse, and facility informed. CSW signing off.   [place], [place]

## 2013-11-05 NOTE — Progress Notes (Signed)
ANTICOAGULATION CONSULT NOTE - Follow Up Consult  Pharmacy Consult for Coumadin Indication: atrial fibrillation  Allergies  Allergen Reactions  . Codeine Other (See Comments)    Childhood allergy    Patient Measurements: Height: 5\' 3"  (160 cm) Weight: 252 lb 3.2 oz (114.397 kg) IBW/kg (Calculated) : 52.4 Heparin Dosing Weight:   Vital Signs: Temp: 98.1 F (36.7 C) (06/03 0855) Temp src: Oral (06/03 0855) BP: 119/43 mmHg (06/03 0855) Pulse Rate: 90 (06/03 0855)  Labs:  Recent Labs  11/03/13 0530 11/04/13 0543 11/05/13 0315  LABPROT 18.3* 19.8* 21.5*  INR 1.57* 1.74* 1.93*  CREATININE 2.90* 2.92* 2.77*    Estimated Creatinine Clearance: 22.4 ml/min (by C-G formula based on Cr of 2.77).   Medications:  Scheduled:  . budesonide (PULMICORT) nebulizer solution  0.25 mg Nebulization BID  . carvedilol  6.25 mg Oral BID WC  . ferrous sulfate  325 mg Oral BID WC  . insulin aspart  0-9 Units Subcutaneous TID WC  . insulin glargine  20 Units Subcutaneous Daily  . isosorbide mononitrate  30 mg Oral Daily  . levalbuterol  0.63 mg Nebulization TID  . loratadine  10 mg Oral Daily  . pregabalin  150 mg Oral BID  . sodium chloride  3 mL Intravenous Q12H  . sodium chloride  3 mL Intravenous Q12H  . torsemide  40 mg Oral Q breakfast  . [START ON 11/06/2013] Vitamin D (Ergocalciferol)  50,000 Units Oral Q Thu  . Warfarin - Pharmacist Dosing Inpatient   Does not apply q1800    Assessment: 73yo female with AFib.  INR trending toward goal, 1.93 this AM.  No bleeding noted, no CBC today. She was on Coumadin prior to admission for h/o Afib. PTA coumadin dose was 5 mg daily, last taken 5/28 PTA and admit INR was therapeutic at 2.49.  INR dropped the next day to 1.52 .  INR is now approaching goal after dose increased to 7.5mg  daily x past 4 days.  Anticipate she may need alternating dose of 7.5 and 5mg  . I will start to taper down on dose today as I expect INR to continue trend upward  after 7.5mg  x 4 days. . Goal of Therapy:  INR 2-3 Monitor platelets by anticoagulation protocol: Yes   Plan:  Coumadin 6mg  po x 1 today. Daily INR  Nicole Cella, RPh Clinical Pharmacist Pager: [phone] 11/05/2013 , 12:20 PM

## 2013-11-05 NOTE — Discharge Summary (Signed)
Physician Discharge Summary  [name] [id number] DOB: 08/22/1940 DOA: 10/30/2013  PCP: Brenda Mustache, MD  Admit date: 10/30/2013 Discharge date: 11/05/2013  Recommendations for Outpatient Follow-up:  1. Followup with cardiology within one month, appointment already scheduled. Please assist patient with transportation to and from her appointment. 2. Repeat BMP in one week to check her creatinine 3. Close observation of her blood sugars, adjustment of her insulin as needed 4. INR check on 6/4 and adjustment of coumadin dose as needed.  Previously was taking 5mg  daily but was subtherapeutic and INR has been recovering with 7.5mg  daily since admission. 5. Torsemide changed to once daily, continue daily weights and call cardiology if she gains more than 3-lbs in 24 hours or 5-lbs in 7 days.   6. Diltiazem stopped  Discharge Diagnoses:  Principal Problem:   Acute on chronic diastolic CHF (congestive heart failure) Active Problems:   Diabetes mellitus   CKD (chronic kidney disease), stage IV   DM type 2, uncontrolled, with neuropathy   Chest pain   Atrial fibrillation   CHF (congestive heart failure)   Chronic respiratory failure   Discharge Condition: Stable, improved  Diet recommendation: Healthy heart, diabetic, soft/chopped foods, thin liquids   Wt Readings from Last 3 Encounters:  11/05/13 114.397 kg (252 lb 3.2 oz)  10/14/13 107.775 kg (237 lb 9.6 oz)  10/07/13 112.7 kg (248 lb 7.3 oz)    History of present illness:  [name] is a 73 y.o. female with history of CHF, CAD, atrial fibrillation, chronic kidney disease, diabetes mellitus presents to the ER because of increasing shortness of breath over the last few days with chest pain. Patient's symptoms are increased on exertion. Patient's shortness of breath is present even at rest. Also has some productive cough. Chest pain is retrosternal and epigastric. Denies any nausea vomiting abdominal pain though patient has  noticed increased abdominal girth and lower extremity edema. Patient states she has not taken her diuretics for last few days. In the ER patient was given Lasix IV and cardiac markers were negative admitted for further management. Labs reveal elevated LFTs. Patient is afebrile.   Hospital Course:   Acute on chronic diastolic CHF, she was diuresed with lasix 40mg  PO bid and was transitioned to torsemide prior to discharge. -09/26/2013 echocardiogram--EF 60-65%  -10/07/2013 discharge weight--112 kg  -Weight on admission 119.7 kg  -11/04/13 weight--113.1kg  -negative 7.4L  -Question outpatient compliance  -transition to po lasix as creatinine is trending up-->switch to home torsemide on 11/05/13   Atypical chest pain  -Troponins negative x3  -EKG--Afib with RBBB unchanged   Sinus Pauses  -11/01/13 had 2.1 second pause  -11/02/13--2 more >2 second pauses  -appreciate cardiology  -improved with d/c of diltiazem   Chronic respiratory failure  -Patient is normally on 2 L nasal cannula at home   Transaminasemia  -Abdominal ultrasound remarkable  -Likely due to hepatic congestion from CHF and improved with diuresis.  Diabetes mellitus type 2, uncontrolled.  Hemoglobin A1c 9.9 on 10/02/2013.  She was initially hyperglycemic, but then became hypoglycemic on 6/2.  Her insulin was reduced to lantus 20 units daily and her meal time insulin was changed to low dose SSI.  She will need close monitoring of her blood sugars upon discharge and adjustment of her insulin as needed.    Atrial fibrillation  -Rate controlled without pauses now that diltiazem has been discontinued -Continue warfarin  -Continue carvedilol   CKD stage IV  -Continue to monitor  -  Baseline creatinine 2.2-2.6, creatinine near baseline   Hyperlipidemia  -Continue statin   Coronary artery disease  -Stable  -Continue Imdur    Procedures/Studies:  Dg Chest 2 View 10/30/2013 CLINICAL DATA: Shortness of breath, cough for 4  days, history hypertension, diabetes, coronary artery disease post MI EXAM: CHEST 2 VIEW COMPARISON: 10/04/2013 FINDINGS: Enlargement of cardiac silhouette with pulmonary vascular congestion. Atherosclerotic calcification aorta. Linear density projects over LEFT upper lobe on the PA view not identified on lateral view, likely superimposed artifact. No acute infiltrate, pleural effusion or pneumothorax. Bones demineralized. IMPRESSION: Enlargement of cardiac silhouette with pulmonary vascular congestion. No acute abnormalities. Electronically Signed By: Lavonia Dana M.D. On: 10/30/2013 21:43  US Abdomen Complete  10/31/2013 CLINICAL DATA: Abnormal liver function tests EXAM: ULTRASOUND ABDOMEN COMPLETE COMPARISON: 11/01/2012 FINDINGS: Gallbladder: No gallstones or wall thickening visualized. No sonographic Murphy sign noted. Common bile duct: Diameter: 4 mm in caliber. Liver: No focal lesion identified. Within normal limits in parenchymal echogenicity. IVC: No abnormality visualized. Pancreas: Visualized portion unremarkable. Spleen: Size and appearance within normal limits. Right Kidney: Length: 10.7 cm in length. Echogenicity within normal limits. No mass or hydronephrosis visualized. Moderate cortical thinning. Left Kidney: Length: 9.5 cm in length. Echogenicity within normal limits. No mass or hydronephrosis visualized. Mild cortical thinning. Abdominal aorta: No aneurysm visualized. Other findings: None. IMPRESSION: No acute abdominal pathology. Electronically Signed By: Maryclare Bean M.D. On: 10/31/2013 15:16    Consultations:  Cardiology  Discharge Exam: Filed Vitals:   11/05/13 0855  BP: 119/43  Pulse: 90  Temp: 98.1 F (36.7 C)  Resp:    Filed Vitals:   11/04/13 2038 11/05/13 0454 11/05/13 0827 11/05/13 0855  BP: 100/48 132/65  119/43  Pulse: 100 100  90  Temp: 97.5 F (36.4 C) 98.3 F (36.8 C)  98.1 F (36.7 C)  TempSrc: Oral Oral  Oral  Resp: 16 16    Height:      Weight:  114.397 kg  (252 lb 3.2 oz)    SpO2: 100% 98% 97% 100%    General: Pt is alert, follows commands appropriately, not in acute distress, nasal canula in place HEENT: No icterus, No thrush,[place]/AT  Cardiovascular: IRRR, S1/S2, no rubs, no gallops  Respiratory:  CTAB, no increased WOB Abdomen: Soft/+BS, non tender, non distended, no guarding  Extremities: 1+LE edema, normal tone and bulk   Discharge Instructions      Discharge Instructions   (HEART FAILURE PATIENTS) Call MD:  Anytime you have any of the following symptoms: 1) 3 pound weight gain in 24 hours or 5 pounds in 1 week 2) shortness of breath, with or without a dry hacking cough 3) swelling in the hands, feet or stomach 4) if you have to sleep on extra pillows at night in order to breathe.    Complete by:  As directed      Call MD for:  difficulty breathing, headache or visual disturbances    Complete by:  As directed      Call MD for:  extreme fatigue    Complete by:  As directed      Call MD for:  hives    Complete by:  As directed      Call MD for:  persistant dizziness or light-headedness    Complete by:  As directed      Call MD for:  persistant nausea and vomiting    Complete by:  As directed      Call MD for:  severe uncontrolled pain  Complete by:  As directed      Call MD for:  temperature >100.4    Complete by:  As directed      Diet - low sodium heart healthy    Complete by:  As directed      Diet Carb Modified    Complete by:  As directed      Increase activity slowly    Complete by:  As directed             Medication List    STOP taking these medications       diltiazem 60 MG 12 hr capsule  Commonly known as:  CARDIZEM SR      TAKE these medications       atorvastatin 80 MG tablet  Commonly known as:  LIPITOR  Take 80 mg by mouth daily.     budesonide 0.25 MG/2ML nebulizer solution  Commonly known as:  PULMICORT  Take 2 mLs (0.25 mg total) by nebulization 2 (two) times daily.     carvedilol 6.25 MG  tablet  Commonly known as:  COREG  Take 6.25 mg by mouth 2 (two) times daily with a meal.     cetirizine 10 MG tablet  Commonly known as:  ZYRTEC  Take 10 mg by mouth daily as needed for allergies.     Cyanocobalamin 500 MCG Subl  Place 1 tablet under the tongue daily.     dextrose 40 % Gel  Commonly known as:  GLUTOSE  Take 37.5 g by mouth as needed for low blood sugar (CBG less than 70 or CBG greater than 70 and next meal is more than 1 hours away).     ferrous sulfate 325 (65 FE) MG tablet  Take 325 mg by mouth 2 (two) times daily.     insulin aspart 100 UNIT/ML injection  Commonly known as:  novoLOG  Inject 0-9 Units into the skin 3 (three) times daily with meals.     insulin glargine 100 UNIT/ML injection  Commonly known as:  LANTUS  Inject 0.2 mLs (20 Units total) into the skin at bedtime.     isosorbide mononitrate 30 MG 24 hr tablet  Commonly known as:  IMDUR  Take 30 mg by mouth daily.     levalbuterol 0.63 MG/3ML nebulizer solution  Commonly known as:  XOPENEX  Take 3 mLs (0.63 mg total) by nebulization 3 (three) times daily.     nitroGLYCERIN 0.4 MG SL tablet  Commonly known as:  NITROSTAT  Place 1 mg under the tongue every 5 (five) minutes x 3 doses as needed for chest pain. For chest pain     pregabalin 150 MG capsule  Commonly known as:  LYRICA  Take 150 mg by mouth 2 (two) times daily.     senna-docusate 8.6-50 MG per tablet  Commonly known as:  Senokot-S  Take 1 tablet by mouth at bedtime as needed for mild constipation.     torsemide 20 MG tablet  Commonly known as:  DEMADEX  Take 2 tablets (40 mg total) by mouth daily.     Vitamin D (Ergocalciferol) 50000 UNITS Caps capsule  Commonly known as:  DRISDOL  Take 50,000 Units by mouth every Thursday.     warfarin 5 MG tablet  Commonly known as:  COUMADIN  Take 1.5 tablets (7.5 mg total) by mouth one time only at 6 PM.       Follow-up Information   Follow up with Richardson Dopp, PA-C. ([facility] - 12/02/13 at 2:40pm)    Specialty:  Physician Assistant   Contact information:   [address] [place] [address] [phone]       Follow up with Brenda Mustache, MD. (As needed)    Specialty:  Family Medicine   Contact information:   [place] [address] [phone]        The results of significant diagnostics from this hospitalization (including imaging, microbiology, ancillary and laboratory) are listed below for reference.    Significant Diagnostic Studies: Dg Chest 2 View  10/30/2013   CLINICAL DATA:  Shortness of breath, cough for 4 days, history hypertension, diabetes, coronary artery disease post MI  EXAM: CHEST  2 VIEW  COMPARISON:  10/04/2013  FINDINGS: Enlargement of cardiac silhouette with pulmonary vascular congestion.  Atherosclerotic calcification aorta.  Linear density projects over LEFT upper lobe on the PA view not identified on lateral view, likely superimposed artifact.  No acute infiltrate, pleural effusion or pneumothorax.  Bones demineralized.  IMPRESSION: Enlargement of cardiac silhouette with pulmonary vascular congestion.  No acute abnormalities.   Electronically Signed   By: Lavonia Dana M.D.   On: 10/30/2013 21:43   US Abdomen Complete  10/31/2013   CLINICAL DATA:  Abnormal liver function tests  EXAM: ULTRASOUND ABDOMEN COMPLETE  COMPARISON:  11/01/2012  FINDINGS: Gallbladder:  No gallstones or wall thickening visualized. No sonographic Murphy sign noted.  Common bile duct:  Diameter: 4 mm in caliber.  Liver:  No focal lesion identified. Within normal limits in parenchymal echogenicity.  IVC:  No abnormality visualized.  Pancreas:  Visualized portion unremarkable.  Spleen:  Size and appearance within normal limits.  Right Kidney:  Length: 10.7 cm in length. Echogenicity within normal limits. No mass or hydronephrosis visualized. Moderate cortical thinning.  Left Kidney:  Length: 9.5 cm in length. Echogenicity  within normal limits. No mass or hydronephrosis visualized. Mild cortical thinning.  Abdominal aorta:  No aneurysm visualized.  Other findings:  None.  IMPRESSION: No acute abdominal pathology.   Electronically Signed   By: Maryclare Bean M.D.   On: 10/31/2013 15:16    Microbiology: No results found for this or any previous visit (from the past 240 hour(s)).   Labs: Basic Metabolic Panel:  Recent Labs Lab 11/01/13 0539 11/02/13 0455 11/03/13 0530 11/04/13 0543 11/05/13 0315  NA 142 138 141 141 137  K 4.5 4.2 4.2 4.0 4.4  CL 104 101 102 103 99  CO2 22 23 24 25 26   GLUCOSE 151* 132* 79 69* 366*  BUN 77* 89* 88* 84* 83*  CREATININE 2.60* 2.66* 2.90* 2.92* 2.77*  CALCIUM 8.7 8.7 8.5 8.2* 8.2*  MG  --  2.1  --   --   --    Liver Function Tests:  Recent Labs Lab 10/30/13 2010 10/31/13 0450 11/03/13 0530  AST 189* 136* 25  ALT 162* 136* 54*  ALKPHOS 93 85 70  BILITOT 0.3 0.3 0.3  PROT 7.1 6.7 6.1  ALBUMIN 2.9* 2.7* 2.4*   No results found for this basename: LIPASE, AMYLASE,  in the last 168 hours No results found for this basename: AMMONIA,  in the last 168 hours CBC:  Recent Labs Lab 10/30/13 2010 10/31/13 0450  WBC 6.2 6.3  NEUTROABS 2.9  --   HGB 10.0* 9.6*  HCT 31.4* 30.5*  MCV 92.6 92.7  PLT 140* 122*   Cardiac Enzymes:  Recent Labs Lab 10/31/13 1059 10/31/13 1515 11/01/13 2320 11/02/13 0455  11/02/13 0902  TROPONINI <0.30 <0.30 <0.30 <0.30 <0.30   BNP: BNP (last 3 results)  Recent Labs  07/03/13 0457 10/02/13 0905 10/30/13 2010  PROBNP 3715.0* 6285.0* 4361.0*   CBG:  Recent Labs Lab 11/04/13 1759 11/04/13 1857 11/04/13 2035 11/05/13 0423 11/05/13 0742  GLUCAP 85 171* 241* 276* 154*    Time coordinating discharge: 35 minutes  Signed:  Janece Canterbury  [facility] 11/05/2013, 10:14 AM

## 2013-12-02 ENCOUNTER — Encounter: Payer: Medicare Other | Admitting: Physician Assistant

## 2013-12-11 ENCOUNTER — Ambulatory Visit (INDEPENDENT_AMBULATORY_CARE_PROVIDER_SITE_OTHER): Payer: Medicare Other | Admitting: Nurse Practitioner

## 2013-12-11 ENCOUNTER — Encounter: Payer: Self-pay | Admitting: Nurse Practitioner

## 2013-12-11 VITALS — BP 121/73 | HR 76

## 2013-12-11 DIAGNOSIS — I6350 Cerebral infarction due to unspecified occlusion or stenosis of unspecified cerebral artery: Secondary | ICD-10-CM

## 2013-12-11 NOTE — Progress Notes (Signed)
PATIENT: [name] DOB: 13-Oct-1940  REASON FOR VISIT: routine follow up for stroke HISTORY FROM: patient  HISTORY OF PRESENT ILLNESS: 50 year obese Caucasian lady who noticed difficulty with peripheral vision on the right in mid-March as well as some slurred speech but did not seek medical help. A week later she was admitted with an episode of unresponsiveness at [facility] and diagnosed with hypoglycemia. 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 Carotid ultrasound showed 40-59% proximal right ICA stenosis. She was found to be in atrial fibrillation with rapid ventricular rate. She was started on Coumadin. She states she's done well she is living at home and getting home physical and occupational therapy. She still has some mild peripheral vision on the right. She is able to ambulate with a walker. She has not noticed any memory difficulties or speech problems. She denies any other prior history of strokes or TIAs. She does have significant vascular risk factors in the form of diabetes, heart disease, obesity.   UPDATE 12/11/13 (LL):  Since last visit, she was readmitted to the hospital 10/30/13 for Acute on chronic diastolic CHF.  She was diuresed with lasix and transitioned to torsemide.  She was discharged to [facility] in [place] on 11/05/13 and was discharged home today. She lives with her son.  She reports that she has home health therpaies that are scheduled to come to her house for PT and OT.  Her diabetes is not well controlled, most recent hgb a1c was 9.9.  She was discharged from the hospital on Lantus and SS Novolog, Blood pressure is well controlled, it is 121/73 in the office today. TCDs done after last visit showed diffuse atherosclrosis, most of the anterior circulation were not isonated due to hyperostosis and limited temporal windows.  2D Echo EF was  60-65%; the right ventricular systolic pressure was increased consistent with mild pulmonary hypertension  ROS:  14 system review of systems is positive for shortness of breath, and all the systems negative  ALLERGIES: Allergies  Allergen Reactions  . Codeine Other (See Comments)    Childhood allergy    HOME MEDICATIONS: Outpatient Prescriptions Prior to Visit  Medication Sig Dispense Refill  . atorvastatin (LIPITOR) 80 MG tablet Take 80 mg by mouth daily.      . budesonide (PULMICORT) 0.25 MG/2ML nebulizer solution Take 2 mLs (0.25 mg total) by nebulization 2 (two) times daily.  60 mL  0  . carvedilol (COREG) 6.25 MG tablet Take 6.25 mg by mouth 2 (two) times daily with a meal.      . cetirizine (ZYRTEC) 10 MG tablet Take 10 mg by mouth daily as needed for allergies.       . Cyanocobalamin 500 MCG SUBL Place 1 tablet under the tongue daily.      Marland Kitchen dextrose (GLUTOSE) 40 % GEL Take 37.5 g by mouth as needed for low blood sugar (CBG less than 70 or CBG greater than 70 and next meal is more than 1 hours away).      . ferrous sulfate 325 (65 FE) MG tablet Take 325 mg by mouth 2 (two) times daily.      . insulin aspart (NOVOLOG) 100 UNIT/ML injection Inject 0-9 Units into the skin 3 (three) times daily with meals.  10 mL  11  . insulin glargine (LANTUS) 100 UNIT/ML injection Inject 0.2 mLs (20 Units  total) into the skin at bedtime.  20 mL  11  . isosorbide mononitrate (IMDUR) 30 MG 24 hr tablet Take 30 mg by mouth daily.      Marland Kitchen levalbuterol (XOPENEX) 0.63 MG/3ML nebulizer solution Take 3 mLs (0.63 mg total) by nebulization 3 (three) times daily.  3 mL  12  . nitroGLYCERIN (NITROSTAT) 0.4 MG SL tablet Place 1 mg under the tongue every 5 (five) minutes x 3 doses as needed for chest pain. For chest pain      . pregabalin (LYRICA) 150 MG capsule Take 150 mg by mouth 2 (two) times daily.      Marland Kitchen senna-docusate (SENOKOT-S) 8.6-50 MG per tablet Take 1 tablet by mouth at bedtime as needed for mild  constipation.  30 tablet  0  . torsemide (DEMADEX) 20 MG tablet Take 2 tablets (40 mg total) by mouth daily.  30 tablet  0  . Vitamin D, Ergocalciferol, (DRISDOL) 50000 UNITS CAPS capsule Take 50,000 Units by mouth every Thursday.      . warfarin (COUMADIN) 5 MG tablet Take 1.5 tablets (7.5 mg total) by mouth one time only at 6 PM.  15 tablet  0   No facility-administered medications prior to visit.    PHYSICAL EXAM Filed Vitals:   12/11/13 1537  BP: 121/73  Pulse: 76   Physical Exam  General: obese middle aged lady, seated, in no evident distress, on home O2. Head: head normocephalic and atraumatic. Orohparynx benign  Neck: supple with no carotid or supraclavicular bruits  Cardiovascular: regular rate and rhythm, no murmurs  Musculoskeletal: no deformity  Skin: no rash/petichiae  Vascular: Normal pulses all extremities   Neurologic Exam  Mental Status: Awake and fully alert. Oriented to place and time. Recent and remote memory intact. Attention span, concentration and fund of knowledge appropriate. Mood and affect appropriate. Diminished recall 1/3  Cranial Nerves: Fundoscopic exam not done. Pupils equal, briskly reactive to light. Extraocular movements full without nystagmus. Visual fields show partial right homonymous hemianopsia to confrontation. Hearing intact. Facial sensation intact. Face, tongue, palate moves normally and symmetrically.  Motor: Normal bulk and tone. Normal strength in all tested extremity muscles.  Sensory: intact to touch, pinprick. Diminished vibratory and position sense over toes bilaterally.  Coordination: Rapid alternating movements normal in all extremities. Finger-to-nose and heel-to-shin performed accurately bilaterally.  Gait and Station: Arises from chair with mild difficulty. Stance is broad based. Gait demonstrates slight broad base  Reflexes: 1+ and symmetric. Toes downgoing.  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 Multiple vascular risk factors of diabetes, heart disease, atrial fibrillation and obesity.   PLAN:  I had a long discussion with the patient and her son regarding a recent subacute stroke, ablation and risk reduction and need for long-term anticoagulation and risk factor control. Continue coumadin for atrial fibrillation and as secondary stroke prevention and strict control of diabetes with hemoglobin A1c goal below 6.5%, lipids with LDL cholesterol goal below 7 mg percent and hypertension with blood pressure goal below 130/90. I also advised her to if possible diet, exercise and lose weight.  Repeat Carotid doppler study in 6 months. Return for followup in 6 months or call earlier if necessary.   Philmore Pali, MSN, NP-C 12/11/2013, 8:36 PM [facility] [address], [place], [place] [address] [phone]  Note: This document was  prepared with digital dictation and possible smart phrase technology. Any transcriptional errors that result from this process are unintentional.

## 2013-12-11 NOTE — Patient Instructions (Signed)
Continue coumadin for atrial fibrillation and as secondary stroke prevention and strict control of diabetes with hemoglobin A1c goal below 7%, lipids with LDL cholesterol goal below 70 mg percent and hypertension with blood pressure goal below 140/90. I also advised her to if possible diet, exercise and lose weight. Return for followup in 6 months or call earlier if necessary.   Stroke Prevention Some medical conditions and behaviors are associated with an increased chance of having a stroke. You may prevent a stroke by making healthy choices and managing medical conditions. HOW CAN I REDUCE MY RISK OF HAVING A STROKE?   Stay physically active. Get at least 30 minutes of activity on most or all days.  Do not smoke. It may also be helpful to avoid exposure to secondhand smoke.  Limit alcohol use. Moderate alcohol use is considered to be:  No more than 2 drinks per day for men.  No more than 1 drink per day for nonpregnant women.  Eat healthy foods. This involves  Eating 5 or more servings of fruits and vegetables a day.  Following a diet that addresses high blood pressure (hypertension), high cholesterol, diabetes, or obesity.  Manage your cholesterol levels.  A diet low in saturated fat, trans fat, and cholesterol and high in fiber may control cholesterol levels.  Take any prescribed medicines to control cholesterol as directed by your health care provider.  Manage your diabetes.  A controlled-carbohydrate, controlled-sugar diet is recommended to manage diabetes.  Take any prescribed medicines to control diabetes as directed by your health care provider.  Control your hypertension.  A low-salt (sodium), low-saturated fat, low-trans fat, and low-cholesterol diet is recommended to manage hypertension.  Take any prescribed medicines to control hypertension as directed by your health care provider.  Maintain a healthy weight.  A reduced-calorie, low-sodium, low-saturated fat,  low-trans fat, low-cholesterol diet is recommended to manage weight.  Stop drug abuse.  Avoid taking birth control pills.  Talk to your health care provider about the risks of taking birth control pills if you are over 4 years old, smoke, get migraines, or have ever had a blood clot.  Get evaluated for sleep disorders (sleep apnea).  Talk to your health care provider about getting a sleep evaluation if you snore a lot or have excessive sleepiness.  Take medicines as directed by your health care provider.  For some people, aspirin or blood thinners (anticoagulants) are helpful in reducing the risk of forming abnormal blood clots that can lead to stroke. If you have the irregular heart rhythm of atrial fibrillation, you should be on a blood thinner unless there is a good reason you cannot take them.  Understand all your medicine instructions.  Make sure that other other conditions (such as anemia or atherosclerosis) are addressed. SEEK IMMEDIATE MEDICAL CARE IF:   You have sudden weakness or numbness of the face, arm, or leg, especially on one side of the body.  Your face or eyelid droops to one side.  You have sudden confusion.  You have trouble speaking (aphasia) or understanding.  You have sudden trouble seeing in one or both eyes.  You have sudden trouble walking.  You have dizziness.  You have a loss of balance or coordination.  You have a sudden, severe headache with no known cause.  You have new chest pain or an irregular heartbeat. Any of these symptoms may represent a serious problem that is an emergency. Do not wait to see if the symptoms will go away.  Get medical help at once. Call your local emergency services  (911 in [place] Do not drive yourself to the hospital. Document Released: 06/29/2004 Document Revised: 03/12/2013 Document Reviewed: 11/22/2012 [facility] Patient Information 2015 [place], [place]. This information is not intended to replace advice given to you  by your health care provider. Make sure you discuss any questions you have with your health care provider.

## 2013-12-14 NOTE — Progress Notes (Signed)
I agree with the above plan 

## 2013-12-17 ENCOUNTER — Encounter: Payer: Medicare Other | Admitting: Physician Assistant

## 2013-12-17 NOTE — Progress Notes (Signed)
This encounter was created in error - please disregard.

## 2013-12-17 NOTE — Progress Notes (Deleted)
Cardiology Office Note    Date:  12/17/2013   ID:  [name], DOB 11/01/1940, MRN [id number]  PCP:  Sherrie Mustache, MD  Cardiologist:  Dr. Minus Breeding   Electrophysiologist:  ***   History of Present Illness: [name] is a 73 y.o. female with a hx of CAD, DM, HTN, HL, OSA, CKD, chronic AFib on coumadin Rx.  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  She was dx with AFib with RVR during that admission.  She is followed by [facility] (Dr. Leonie Man).    She was admitted ***     Studies:   - LHC (04/2011):   Left main:  no disease.  Left Anterior Descending Artery: mid 70% (1.75 mm). Circumflex Artery:  30% proximal   Right Coronary Artery: mid diffuse 95% (1.25 mm vessel).  Left Ventricular Angiogram: Not performed.  >>> Med Rx.   - Echo (09/2013):   - Left ventricle: The cavity size was normal. Systolic function was normal. The estimated ejection fraction was in the range of 60% to 65%. Wall motion was normal; there were no regional wall motion abnormalities. - Mitral valve: Calcified annulus. Mildly thickened leaflets.  . Mild regurgitation. - Left atrium: The atrium was mildly dilated. - Tricuspid valve: Moderate regurgitation. - Pulmonary arteries: PA peak pressure: 60mm Hg (S). Impressions: - The right ventricular systolic pressure was increased consistent with mild pulmonary hypertension.   - Carotid US (08/2013):   RICA 40-59%   Recent Labs: 07/02/2013: HDL Cholesterol by NMR 49; LDL (calc) 34  10/02/2013: TSH 0.970  10/30/2013: Pro B Natriuretic peptide (BNP) 4361.0*  10/31/2013: Hemoglobin 9.6*  11/03/2013: ALT 54*  11/05/2013: Creatinine 2.77*; Potassium 4.4   Wt Readings from Last 3 Encounters:  11/05/13 252 lb 3.2 oz (114.397 kg)  10/14/13 237 lb 9.6 oz (107.775 kg)  10/07/13 248 lb 7.3 oz (112.7 kg)     Past Medical History  Diagnosis Date  . Diabetes mellitus   . Hypertension   . Hyperlipemia   . Fibromyalgia   .  Obesity   . Sleep apnea     No CPAP  . Anxiety   . Hiatal hernia   . NSTEMI (non-ST elevated myocardial infarction) 04/27/2011  . Anemia 04/27/2011  . Restless leg syndrome   . CAD (coronary artery disease)     small dominant right coronary artery with diffuse 95% stenosis. The LAD had 70% stenosis in the mid vessel beyond the diagonal branch but was a small vessel. Circumflex had proximal 30% stenosis.   Marland Kitchen External hemorrhoids   . Arrhythmia 10/11/2011  . Community acquired pneumonia 06/24/2012  . Renal insufficiency 05/06/2011  . Shingles   . CHF exacerbation 10/02/2013    Current Outpatient Prescriptions  Medication Sig Dispense Refill  . albuterol (PROVENTIL) (2.5 MG/3ML) 0.083% nebulizer solution Take 2.5 mg by nebulization every 6 (six) hours as needed for wheezing or shortness of breath.      Marland Kitchen atorvastatin (LIPITOR) 80 MG tablet Take 80 mg by mouth daily.      . budesonide (PULMICORT) 0.25 MG/2ML nebulizer solution Take 2 mLs (0.25 mg total) by nebulization 2 (two) times daily.  60 mL  0  . carvedilol (COREG) 6.25 MG tablet Take 6.25 mg by mouth 2 (two) times daily with a meal.      . cetirizine (ZYRTEC) 10 MG tablet Take 10 mg by mouth daily as needed for allergies.       . Cyanocobalamin  500 MCG SUBL Place 1 tablet under the tongue daily.      Marland Kitchen dextrose (GLUTOSE) 40 % GEL Take 37.5 g by mouth as needed for low blood sugar (CBG less than 70 or CBG greater than 70 and next meal is more than 1 hours away).      . ferrous sulfate 325 (65 FE) MG tablet Take 325 mg by mouth 2 (two) times daily.      . insulin aspart (NOVOLOG) 100 UNIT/ML injection Inject 0-9 Units into the skin 3 (three) times daily with meals.  10 mL  11  . insulin glargine (LANTUS) 100 UNIT/ML injection Inject 0.2 mLs (20 Units total) into the skin at bedtime.  20 mL  11  . isosorbide mononitrate (IMDUR) 30 MG 24 hr tablet Take 30 mg by mouth daily.      Marland Kitchen levalbuterol (XOPENEX) 0.63 MG/3ML nebulizer solution Take 3  mLs (0.63 mg total) by nebulization 3 (three) times daily.  3 mL  12  . nitroGLYCERIN (NITROSTAT) 0.4 MG SL tablet Place 1 mg under the tongue every 5 (five) minutes x 3 doses as needed for chest pain. For chest pain      . Omega-3 Fatty Acids (FISH OIL PO) Take by mouth 3 (three) times daily. Taking 2 tablets 3 times daily with meals      . pregabalin (LYRICA) 150 MG capsule Take 150 mg by mouth 2 (two) times daily.      Marland Kitchen senna-docusate (SENOKOT-S) 8.6-50 MG per tablet Take 1 tablet by mouth at bedtime as needed for mild constipation.  30 tablet  0  . torsemide (DEMADEX) 20 MG tablet Take 2 tablets (40 mg total) by mouth daily.  30 tablet  0  . Vitamin D, Ergocalciferol, (DRISDOL) 50000 UNITS CAPS capsule Take 50,000 Units by mouth every Thursday.      . warfarin (COUMADIN) 5 MG tablet Take 1.5 tablets (7.5 mg total) by mouth one time only at 6 PM.  15 tablet  0   No current facility-administered medications for this visit.    Allergies:   Codeine   Social History:  The patient  reports that she has never smoked. She has never used smokeless tobacco. She reports that she does not drink alcohol or use illicit drugs.   Family History:  The patient's family history includes Dementia in her father; Heart disease in her maternal grandfather and maternal grandmother; Stomach cancer in her mother.   ROS:  Please see the history of present illness.   ***   All other systems reviewed and negative.   PHYSICAL EXAM: VS:  There were no vitals taken for this visit. Well nourished, well developed, in no acute distress HEENT: normal Neck: ***no JVD Cardiac:  normal S1, S2; ***RRR; no murmur Lungs:  ***clear to auscultation bilaterally, no wheezing, rhonchi or rales Abd: soft, nontender, no hepatomegaly Ext: ***no edema Skin: warm and dry Neuro:  CNs 2-12 intact, no focal abnormalities noted  EKG:  ***     ASSESSMENT AND PLAN:  No diagnosis found. ***   Signed, Versie Starks,  MHS 12/17/2013 2:29 PM    [facility] [place], [place], [place]  [address] Phone: [phone]; Fax: [phone]

## 2013-12-26 ENCOUNTER — Inpatient Hospital Stay (HOSPITAL_COMMUNITY): Payer: Medicare Other

## 2013-12-26 ENCOUNTER — Encounter (HOSPITAL_COMMUNITY): Payer: Self-pay | Admitting: Emergency Medicine

## 2013-12-26 ENCOUNTER — Inpatient Hospital Stay (HOSPITAL_COMMUNITY)
Admission: EM | Admit: 2013-12-26 | Discharge: 2013-12-31 | DRG: 638 | Disposition: A | Discharge status: Skilled Nursing Facility | Payer: Medicare Other | Attending: Internal Medicine | Admitting: Internal Medicine

## 2013-12-26 DIAGNOSIS — E1100 Type 2 diabetes mellitus with hyperosmolarity without nonketotic hyperglycemic-hyperosmolar coma (NKHHC): Secondary | ICD-10-CM | POA: Diagnosis not present

## 2013-12-26 DIAGNOSIS — E1142 Type 2 diabetes mellitus with diabetic polyneuropathy: Secondary | ICD-10-CM | POA: Diagnosis present

## 2013-12-26 DIAGNOSIS — R0789 Other chest pain: Secondary | ICD-10-CM | POA: Diagnosis present

## 2013-12-26 DIAGNOSIS — Z6841 Body Mass Index (BMI) 40.0 and over, adult: Secondary | ICD-10-CM | POA: Diagnosis not present

## 2013-12-26 DIAGNOSIS — E1149 Type 2 diabetes mellitus with other diabetic neurological complication: Secondary | ICD-10-CM | POA: Diagnosis present

## 2013-12-26 DIAGNOSIS — R000 Tachycardia, unspecified: Secondary | ICD-10-CM | POA: Diagnosis not present

## 2013-12-26 DIAGNOSIS — I252 Old myocardial infarction: Secondary | ICD-10-CM | POA: Diagnosis not present

## 2013-12-26 DIAGNOSIS — E872 Acidosis, unspecified: Secondary | ICD-10-CM

## 2013-12-26 DIAGNOSIS — N184 Chronic kidney disease, stage 4 (severe): Secondary | ICD-10-CM | POA: Diagnosis not present

## 2013-12-26 DIAGNOSIS — N390 Urinary tract infection, site not specified: Secondary | ICD-10-CM | POA: Diagnosis present

## 2013-12-26 DIAGNOSIS — G4730 Sleep apnea, unspecified: Secondary | ICD-10-CM | POA: Diagnosis present

## 2013-12-26 DIAGNOSIS — IMO0002 Reserved for concepts with insufficient information to code with codable children: Secondary | ICD-10-CM

## 2013-12-26 DIAGNOSIS — Z91199 Patient's noncompliance with other medical treatment and regimen due to unspecified reason: Secondary | ICD-10-CM

## 2013-12-26 DIAGNOSIS — IMO0001 Reserved for inherently not codable concepts without codable children: Secondary | ICD-10-CM | POA: Diagnosis present

## 2013-12-26 DIAGNOSIS — I129 Hypertensive chronic kidney disease with stage 1 through stage 4 chronic kidney disease, or unspecified chronic kidney disease: Secondary | ICD-10-CM | POA: Diagnosis present

## 2013-12-26 DIAGNOSIS — Z794 Long term (current) use of insulin: Secondary | ICD-10-CM

## 2013-12-26 DIAGNOSIS — E860 Dehydration: Secondary | ICD-10-CM | POA: Diagnosis present

## 2013-12-26 DIAGNOSIS — E1165 Type 2 diabetes mellitus with hyperglycemia: Secondary | ICD-10-CM

## 2013-12-26 DIAGNOSIS — N179 Acute kidney failure, unspecified: Secondary | ICD-10-CM | POA: Diagnosis not present

## 2013-12-26 DIAGNOSIS — R630 Anorexia: Secondary | ICD-10-CM | POA: Diagnosis present

## 2013-12-26 DIAGNOSIS — E875 Hyperkalemia: Secondary | ICD-10-CM | POA: Diagnosis present

## 2013-12-26 DIAGNOSIS — R739 Hyperglycemia, unspecified: Secondary | ICD-10-CM

## 2013-12-26 DIAGNOSIS — E669 Obesity, unspecified: Secondary | ICD-10-CM | POA: Diagnosis present

## 2013-12-26 DIAGNOSIS — I4891 Unspecified atrial fibrillation: Secondary | ICD-10-CM | POA: Diagnosis present

## 2013-12-26 DIAGNOSIS — Z8673 Personal history of transient ischemic attack (TIA), and cerebral infarction without residual deficits: Secondary | ICD-10-CM

## 2013-12-26 DIAGNOSIS — I452 Bifascicular block: Secondary | ICD-10-CM | POA: Diagnosis present

## 2013-12-26 DIAGNOSIS — E785 Hyperlipidemia, unspecified: Secondary | ICD-10-CM | POA: Diagnosis present

## 2013-12-26 DIAGNOSIS — J9610 Chronic respiratory failure, unspecified whether with hypoxia or hypercapnia: Secondary | ICD-10-CM | POA: Diagnosis not present

## 2013-12-26 DIAGNOSIS — E1140 Type 2 diabetes mellitus with diabetic neuropathy, unspecified: Secondary | ICD-10-CM | POA: Diagnosis present

## 2013-12-26 DIAGNOSIS — I509 Heart failure, unspecified: Secondary | ICD-10-CM | POA: Diagnosis present

## 2013-12-26 DIAGNOSIS — I5033 Acute on chronic diastolic (congestive) heart failure: Secondary | ICD-10-CM

## 2013-12-26 DIAGNOSIS — I2510 Atherosclerotic heart disease of native coronary artery without angina pectoris: Secondary | ICD-10-CM | POA: Diagnosis present

## 2013-12-26 DIAGNOSIS — N189 Chronic kidney disease, unspecified: Secondary | ICD-10-CM

## 2013-12-26 DIAGNOSIS — R7309 Other abnormal glucose: Secondary | ICD-10-CM | POA: Diagnosis present

## 2013-12-26 DIAGNOSIS — Z9119 Patient's noncompliance with other medical treatment and regimen: Secondary | ICD-10-CM

## 2013-12-26 LAB — URINALYSIS, ROUTINE W REFLEX MICROSCOPIC
Bilirubin Urine: NEGATIVE
Glucose, UA: >1000 mg/dL — AB
KETONES UR: NEGATIVE mg/dL
NITRITE: NEGATIVE
Protein, ur: 100 mg/dL — AB
Specific Gravity, Urine: 1.023 (ref 1.005–1.030)
Urobilinogen, UA: 0.2 mg/dL (ref 0.0–1.0)
pH: 5.5 (ref 5.0–8.0)

## 2013-12-26 LAB — BASIC METABOLIC PANEL
ANION GAP: 15 (ref 5–15)
ANION GAP: 14 (ref 5–15)
Anion gap: 20 — ABNORMAL HIGH (ref 5–15)
BUN: 64 mg/dL — AB (ref 6–23)
BUN: 63 mg/dL — ABNORMAL HIGH (ref 6–23)
BUN: 63 mg/dL — ABNORMAL HIGH (ref 6–23)
CALCIUM: 8.9 mg/dL (ref 8.4–10.5)
CALCIUM: 8.9 mg/dL (ref 8.4–10.5)
CHLORIDE: 103 meq/L (ref 96–112)
CO2: 16 mEq/L — ABNORMAL LOW (ref 19–32)
CO2: 17 mEq/L — ABNORMAL LOW (ref 19–32)
CO2: 18 mEq/L — ABNORMAL LOW (ref 19–32)
CREATININE: 2.23 mg/dL — AB (ref 0.50–1.10)
CREATININE: 2.28 mg/dL — AB (ref 0.50–1.10)
CREATININE: 2.27 mg/dL — AB (ref 0.50–1.10)
Calcium: 8.6 mg/dL (ref 8.4–10.5)
Chloride: 107 mEq/L (ref 96–112)
Chloride: 111 mEq/L (ref 96–112)
GFR calc non Af Amer: 21 mL/min — ABNORMAL LOW (ref >90)
GFR, EST AFRICAN AMERICAN: 24 mL/min — AB (ref >90)
GFR, EST AFRICAN AMERICAN: 24 mL/min — AB (ref >90)
GFR, EST AFRICAN AMERICAN: 24 mL/min — AB (ref >90)
GFR, EST NON AFRICAN AMERICAN: 20 mL/min — AB (ref >90)
GFR, EST NON AFRICAN AMERICAN: 20 mL/min — AB (ref >90)
GLUCOSE: 553 mg/dL — AB (ref 70–99)
Glucose, Bld: 281 mg/dL — ABNORMAL HIGH (ref 70–99)
Glucose, Bld: 156 mg/dL — ABNORMAL HIGH (ref 70–99)
POTASSIUM: 5.3 meq/L (ref 3.7–5.3)
Potassium: 4.5 mEq/L (ref 3.7–5.3)
Potassium: 4.3 mEq/L (ref 3.7–5.3)
SODIUM: 143 meq/L (ref 137–147)
Sodium: 139 mEq/L (ref 137–147)
Sodium: 139 mEq/L (ref 137–147)

## 2013-12-26 LAB — URINE MICROSCOPIC-ADD ON

## 2013-12-26 LAB — CBC WITH DIFFERENTIAL/PLATELET
Basophils Absolute: 0 10*3/uL (ref 0.0–0.1)
Basophils Relative: 0 % (ref 0–1)
EOS ABS: 0 10*3/uL (ref 0.0–0.7)
Eosinophils Relative: 0 % (ref 0–5)
HCT: 37.1 % (ref 36.0–46.0)
Hemoglobin: 11.2 g/dL — ABNORMAL LOW (ref 12.0–15.0)
Lymphocytes Relative: 22 % (ref 12–46)
Lymphs Abs: 2.1 10*3/uL (ref 0.7–4.0)
MCH: 28.3 pg (ref 26.0–34.0)
MCHC: 30.2 g/dL (ref 30.0–36.0)
MCV: 93.7 fL (ref 78.0–100.0)
MONO ABS: 0.5 10*3/uL (ref 0.1–1.0)
Monocytes Relative: 5 % (ref 3–12)
NEUTROS ABS: 6.9 10*3/uL (ref 1.7–7.7)
NEUTROS PCT: 73 % (ref 43–77)
Platelets: 116 10*3/uL — ABNORMAL LOW (ref 150–400)
RBC: 3.96 MIL/uL (ref 3.87–5.11)
RDW: 18.5 % — AB (ref 11.5–15.5)
WBC: 9.5 10*3/uL (ref 4.0–10.5)

## 2013-12-26 LAB — COMPREHENSIVE METABOLIC PANEL
ALK PHOS: 104 U/L (ref 39–117)
ALT: 36 U/L — ABNORMAL HIGH (ref 0–35)
AST: 23 U/L (ref 0–37)
Albumin: 3.3 g/dL — ABNORMAL LOW (ref 3.5–5.2)
Anion gap: 19 — ABNORMAL HIGH (ref 5–15)
BILIRUBIN TOTAL: 0.5 mg/dL (ref 0.3–1.2)
BUN: 65 mg/dL — AB (ref 6–23)
CHLORIDE: 99 meq/L (ref 96–112)
CO2: 16 meq/L — AB (ref 19–32)
Calcium: 8.9 mg/dL (ref 8.4–10.5)
Creatinine, Ser: 2.22 mg/dL — ABNORMAL HIGH (ref 0.50–1.10)
GFR calc non Af Amer: 21 mL/min — ABNORMAL LOW (ref >90)
GFR, EST AFRICAN AMERICAN: 24 mL/min — AB (ref >90)
Glucose, Bld: 663 mg/dL (ref 70–99)
POTASSIUM: 6.6 meq/L — AB (ref 3.7–5.3)
SODIUM: 134 meq/L — AB (ref 137–147)
Total Protein: 7.6 g/dL (ref 6.0–8.3)

## 2013-12-26 LAB — I-STAT VENOUS BLOOD GAS, ED
ACID-BASE DEFICIT: 6 mmol/L — AB (ref 0.0–2.0)
Bicarbonate: 21.4 mEq/L (ref 20.0–24.0)
O2 Saturation: 52 %
PCO2 VEN: 50.9 mmHg — AB (ref 45.0–50.0)
TCO2: 23 mmol/L (ref 0–100)
pH, Ven: 7.232 — ABNORMAL LOW (ref 7.250–7.300)
pO2, Ven: 33 mmHg (ref 30.0–45.0)

## 2013-12-26 LAB — CBC
HCT: 34.9 % — ABNORMAL LOW (ref 36.0–46.0)
Hemoglobin: 10.5 g/dL — ABNORMAL LOW (ref 12.0–15.0)
MCH: 27.9 pg (ref 26.0–34.0)
MCHC: 30.1 g/dL (ref 30.0–36.0)
MCV: 92.8 fL (ref 78.0–100.0)
Platelets: 118 10*3/uL — ABNORMAL LOW (ref 150–400)
RBC: 3.76 MIL/uL — ABNORMAL LOW (ref 3.87–5.11)
RDW: 18.4 % — ABNORMAL HIGH (ref 11.5–15.5)
WBC: 8.3 10*3/uL (ref 4.0–10.5)

## 2013-12-26 LAB — CBG MONITORING, ED: Glucose-Capillary: 598 mg/dL (ref 70–99)

## 2013-12-26 LAB — OSMOLALITY: Osmolality: 336 mOsm/kg — ABNORMAL HIGH (ref 275–300)

## 2013-12-26 LAB — GLUCOSE, CAPILLARY
COMMENT 1: NEGATIVE
GLUCOSE-CAPILLARY: 562 mg/dL — AB (ref 70–99)
GLUCOSE-CAPILLARY: 595 mg/dL — AB (ref 70–99)
GLUCOSE-CAPILLARY: 550 mg/dL — AB (ref 70–99)
GLUCOSE-CAPILLARY: 331 mg/dL — AB (ref 70–99)
GLUCOSE-CAPILLARY: 133 mg/dL — AB (ref 70–99)
Glucose-Capillary: 282 mg/dL — ABNORMAL HIGH (ref 70–99)
Glucose-Capillary: 190 mg/dL — ABNORMAL HIGH (ref 70–99)

## 2013-12-26 LAB — KETONES, QUALITATIVE: Acetone, Bld: NEGATIVE

## 2013-12-26 LAB — TROPONIN I: Troponin I: <0.3 ng/mL (ref <0.30)

## 2013-12-26 LAB — LACTIC ACID, PLASMA: LACTIC ACID, VENOUS: 2.2 mmol/L (ref 0.5–2.2)

## 2013-12-26 LAB — PROTIME-INR
INR: 2.3 — ABNORMAL HIGH (ref 0.00–1.49)
PROTHROMBIN TIME: 25.3 s — AB (ref 11.6–15.2)

## 2013-12-26 LAB — SALICYLATE LEVEL: Salicylate Lvl: <2 mg/dL — ABNORMAL LOW (ref 2.8–20.0)

## 2013-12-26 LAB — MRSA PCR SCREENING: MRSA by PCR: POSITIVE — AB

## 2013-12-26 MED ORDER — WARFARIN SODIUM 7.5 MG PO TABS
7.5000 mg | ORAL_TABLET | Freq: Once | ORAL | Status: AC
  Administered 2013-12-26: 7.5 mg via ORAL
  Filled 2013-12-26: qty 1

## 2013-12-26 MED ORDER — WARFARIN - PHARMACIST DOSING INPATIENT
Freq: Every day | Status: DC
  Administered 2013-12-26 – 2013-12-28 (×3)

## 2013-12-26 MED ORDER — SODIUM CHLORIDE 0.9 % IV SOLN
1.0000 g | Freq: Once | INTRAVENOUS | Status: AC
  Administered 2013-12-26: 1 g via INTRAVENOUS
  Filled 2013-12-26: qty 10

## 2013-12-26 MED ORDER — CARVEDILOL 6.25 MG PO TABS
6.2500 mg | ORAL_TABLET | Freq: Two times a day (BID) | ORAL | Status: DC
  Administered 2013-12-26 – 2013-12-28 (×4): 6.25 mg via ORAL
  Filled 2013-12-26 (×6): qty 1

## 2013-12-26 MED ORDER — SODIUM POLYSTYRENE SULFONATE 15 GM/60ML PO SUSP
30.0000 g | Freq: Once | ORAL | Status: AC
  Administered 2013-12-26: 30 g via ORAL
  Filled 2013-12-26: qty 120

## 2013-12-26 MED ORDER — SODIUM CHLORIDE 0.9 % IV SOLN
INTRAVENOUS | Status: AC
  Administered 2013-12-26: 17:00:00 via INTRAVENOUS

## 2013-12-26 MED ORDER — METOPROLOL TARTRATE 1 MG/ML IV SOLN
INTRAVENOUS | Status: AC
  Filled 2013-12-26: qty 5

## 2013-12-26 MED ORDER — SODIUM CHLORIDE 0.9 % IV SOLN: INTRAVENOUS | Status: DC

## 2013-12-26 MED ORDER — BIOTENE DRY MOUTH MT LIQD
15.0000 mL | Freq: Two times a day (BID) | OROMUCOSAL | Status: DC
  Administered 2013-12-26 – 2013-12-31 (×9): 15 mL via OROMUCOSAL

## 2013-12-26 MED ORDER — PREGABALIN 75 MG PO CAPS
150.0000 mg | ORAL_CAPSULE | Freq: Two times a day (BID) | ORAL | Status: DC
  Administered 2013-12-26 – 2013-12-27 (×2): 150 mg via ORAL
  Filled 2013-12-26 (×2): qty 2

## 2013-12-26 MED ORDER — INSULIN ASPART 100 UNIT/ML IV SOLN: 10.0000 [IU] | Freq: Once | INTRAVENOUS | Status: DC

## 2013-12-26 MED ORDER — METOPROLOL TARTRATE 1 MG/ML IV SOLN
2.5000 mg | Freq: Once | INTRAVENOUS | Status: AC
  Administered 2013-12-27: 2.5 mg via INTRAVENOUS
  Filled 2013-12-26: qty 5

## 2013-12-26 MED ORDER — ALBUTEROL SULFATE (2.5 MG/3ML) 0.083% IN NEBU
10.0000 mg | INHALATION_SOLUTION | Freq: Once | RESPIRATORY_TRACT | Status: AC
  Administered 2013-12-26: 10 mg via RESPIRATORY_TRACT
  Filled 2013-12-26: qty 12

## 2013-12-26 MED ORDER — SODIUM CHLORIDE 0.9 % IV SOLN
INTRAVENOUS | Status: DC
  Administered 2013-12-26: 18:00:00 via INTRAVENOUS

## 2013-12-26 MED ORDER — SODIUM CHLORIDE 0.9 % IV BOLUS (SEPSIS)
1000.0000 mL | Freq: Once | INTRAVENOUS | Status: AC
  Administered 2013-12-26: 1000 mL via INTRAVENOUS

## 2013-12-26 MED ORDER — MUPIROCIN 2 % EX OINT
1.0000 "application " | TOPICAL_OINTMENT | Freq: Two times a day (BID) | CUTANEOUS | Status: AC
  Administered 2013-12-26 – 2013-12-31 (×10): 1 via NASAL
  Filled 2013-12-26 (×2): qty 22

## 2013-12-26 MED ORDER — CHLORHEXIDINE GLUCONATE CLOTH 2 % EX PADS
6.0000 | MEDICATED_PAD | Freq: Every day | CUTANEOUS | Status: AC
  Administered 2013-12-27 – 2013-12-31 (×5): 6 via TOPICAL

## 2013-12-26 MED ORDER — DEXTROSE 50 % IV SOLN: 25.0000 mL | INTRAVENOUS | Status: DC | PRN

## 2013-12-26 MED ORDER — METOPROLOL TARTRATE 1 MG/ML IV SOLN
2.5000 mg | Freq: Once | INTRAVENOUS | Status: AC
  Administered 2013-12-26: 2.5 mg via INTRAVENOUS

## 2013-12-26 MED ORDER — TORSEMIDE 20 MG PO TABS
40.0000 mg | ORAL_TABLET | Freq: Every day | ORAL | Status: DC
  Filled 2013-12-26: qty 2

## 2013-12-26 MED ORDER — DEXTROSE-NACL 5-0.45 % IV SOLN
INTRAVENOUS | Status: DC
  Administered 2013-12-26: 22:00:00 via INTRAVENOUS

## 2013-12-26 MED ORDER — DEXTROSE-NACL 5-0.45 % IV SOLN: INTRAVENOUS | Status: DC

## 2013-12-26 MED ORDER — SODIUM BICARBONATE 8.4 % IV SOLN
50.0000 meq | Freq: Once | INTRAVENOUS | Status: AC
  Administered 2013-12-26: 50 meq via INTRAVENOUS
  Filled 2013-12-26: qty 50

## 2013-12-26 MED ORDER — SODIUM CHLORIDE 0.9 % IV SOLN
INTRAVENOUS | Status: DC
  Administered 2013-12-26: 9.8 [IU]/h via INTRAVENOUS
  Administered 2013-12-26 (×2): 5.4 [IU]/h via INTRAVENOUS
  Filled 2013-12-26: qty 1

## 2013-12-26 MED ORDER — INSULIN ASPART 100 UNIT/ML ~~LOC~~ SOLN: 10.0000 [IU] | Freq: Once | SUBCUTANEOUS | Status: DC

## 2013-12-26 MED ORDER — HEPARIN SODIUM (PORCINE) 5000 UNIT/ML IJ SOLN: 5000.0000 [IU] | Freq: Three times a day (TID) | INTRAMUSCULAR | Status: DC

## 2013-12-26 MED ORDER — ONDANSETRON HCL 4 MG/2ML IJ SOLN
4.0000 mg | Freq: Three times a day (TID) | INTRAMUSCULAR | Status: AC | PRN
Start: 2013-12-26 — End: 2013-12-27

## 2013-12-26 NOTE — ED Notes (Signed)
PT HAS HAD BM X2

## 2013-12-26 NOTE — H&P (Signed)
Date: 12/26/2013               Patient Name:  [name] MRN: [id number]  DOB: May 01, 1941 Age / Sex: 73 y.o., female   PCP: Dione Housekeeper, MD         Medical Service: Internal Medicine Teaching Service         Attending Physician: Dr. Karren Cobble, MD    First Contact: Dr. Lottie Mussel Pager: [phone]  Second Contact: Dr. Clinton Gallant Pager: [phone]       After Hours (After 5p/  First Contact Pager: [phone]  weekends / holidays): Second Contact Pager: [phone]   Chief Complaint: hyperglycemia  History of Present Illness: [name] is a 73 year old woman with DM2, atrial fibrillation, h/o CVA in 08/2013, CKD stage IV, chronic respiratory failure, Patient was a poor historian. She said she was sleepy and was getting continuous nebulizer treatments during the interview and exam. Per the EMR, she has had 3 days of general weakness, nausea, and lightheadedness. EMS was called to [name]'s house when she had a high glucose reading. She did not remember what her home insulin regimen is (20 u lantus, 0-9 u novolog TIDAC), but insisted that she never misses any medications. Her blood sugar when she arrived here was 598 on CBG and 663 on BMP. She was started on 1 L NS with an insulin drip and begun on albuterol nubulizer treatment for a K of 6.6. She reported dizziness, L sided chest pressure (but denied it to the ED), SOB, and polyuria. She denied loss of consciousness, cough, nausea, emesis, dysuria, or vision changes.  Meds: Current Facility-Administered Medications  Medication Dose Route Frequency Provider Last Rate Last Dose  . 0.9 %  sodium chloride infusion   Intravenous Continuous Otho Bellows, MD      . 0.9 %  sodium chloride infusion   Intravenous STAT Carmin Muskrat, MD      . 0.9 %  sodium chloride infusion   Intravenous Continuous Otho Bellows, MD      . calcium gluconate 1 g in sodium chloride 0.9 % 100 mL IVPB  1 g Intravenous Once Otho Bellows, MD      . dextrose 5  %-0.45 % sodium chloride infusion   Intravenous Continuous Otho Bellows, MD      . dextrose 50 % solution 25 mL  25 mL Intravenous PRN Carmin Muskrat, MD      . insulin aspart (novoLOG) injection 10 Units  10 Units Intravenous Once Carmin Muskrat, MD      . insulin regular (NOVOLIN R,HUMULIN R) 1 Units/mL in sodium chloride 0.9 % 100 mL infusion   Intravenous Continuous Carmin Muskrat, MD 9.8 mL/hr at 12/26/13 1809 9.8 Units/hr at 12/26/13 1809  . ondansetron (ZOFRAN) injection 4 mg  4 mg Intravenous Q8H PRN Carmin Muskrat, MD      . warfarin (COUMADIN) tablet 7.5 mg  7.5 mg Oral ONCE-1800 Erik Obey, RPH      . Warfarin - Pharmacist Dosing Inpatient   Does not apply [place], [facility]        Allergies: Allergies as of 12/26/2013 - Review Complete 12/26/2013  Allergen Reaction Noted  . Codeine Other (See Comments) 04/26/2011   Past Medical History  Diagnosis Date  . Diabetes mellitus   . Hypertension   . Hyperlipemia   . Fibromyalgia   . Obesity   . Sleep apnea     No  CPAP  . Anxiety   . Hiatal hernia   . NSTEMI (non-ST elevated myocardial infarction) 04/27/2011  . Anemia 04/27/2011  . Restless leg syndrome   . CAD (coronary artery disease)     small dominant right coronary artery with diffuse 95% stenosis. The LAD had 70% stenosis in the mid vessel beyond the diagonal branch but was a small vessel. Circumflex had proximal 30% stenosis.   Marland Kitchen External hemorrhoids   . Arrhythmia 10/11/2011  . Community acquired pneumonia 06/24/2012  . Renal insufficiency 05/06/2011  . Shingles   . CHF exacerbation 10/02/2013   Past Surgical History  Procedure Laterality Date  . Appendectomy    . Fracture surgery      left humerous  . Ganglion cyst removal     Family History  Problem Relation Age of Onset  . Stomach cancer Mother   . Heart disease Maternal Grandmother   . Heart disease Maternal Grandfather   . Dementia Father    History   Social History  . Marital  Status: Widowed    Spouse Name: N/A    Number of Children: 4  . Years of Education: 12th   Occupational History  . Retired    Social History Main Topics  . Smoking status: Never Smoker   . Smokeless tobacco: Never Used  . Alcohol Use: No  . Drug Use: No  . Sexual Activity: No   Other Topics Concern  . Not on file   Social History Narrative   Patient lives at home with son, she is widowed with 4 children   Patient is right handed   Patient has high school education   Patient drinks 2 cups daily    Review of Systems: A comprehensive review of systems was negative except for: as noted above in HPI  Physical Exam: Blood pressure 149/113, pulse 142, temperature 98.2 F (36.8 C), temperature source Rectal, resp. rate 21, height 5\' 3"  (1.6 m), weight 350 lb (158.759 kg), SpO2 97.00%. BP 149/113  Pulse 142  Temp(Src) 98.2 F (36.8 C) (Rectal)  Resp 21  Ht 5\' 3"  (1.6 m)  Wt 350 lb (158.759 kg)  BMI 62.02 kg/m2  SpO2 97%  General Appearance:    Somnolent, receiving neb treatment, difficult to understand  HEENT:    Normocephalic, without obvious abnormality, atraumatic, PERRL, EOMI  Back:     Symmetric, no curvature, no CVA tenderness  Lungs:     Diffuse expiratory wheezes bilaterally while on nebs  Chest Wall:    No tenderness or deformity   Heart:    Irregularly irregular rate and rhythm, S1 and S2 normal, no murmur, rub   or gallop  Abdomen:     Soft, non-tender, bowel sounds active all four quadrants,    no masses, no organomegaly  Extremities:   Ecchymoses on dorsal aspect of both arms, 1+ pitting edema in bilateral LE  Pulses:   2+ and symmetric all radial and DP pulses    Lab results: Basic Metabolic Panel:  Recent Labs  12/26/13 1254  NA 134*  K 6.6*  CL 99  CO2 16*  GLUCOSE 663*  BUN 65*  CREATININE 2.22*  CALCIUM 8.9   Liver Function Tests:  Recent Labs  12/26/13 1254  AST 23  ALT 36*  ALKPHOS 104  BILITOT 0.5  PROT 7.6  ALBUMIN 3.3*   No  results found for this basename: LIPASE, AMYLASE,  in the last 72 hours No results found for this basename: AMMONIA,  in  the last 72 hours CBC:  Recent Labs  12/26/13 1254  WBC 9.5  NEUTROABS 6.9  HGB 11.2*  HCT 37.1  MCV 93.7  PLT 116*   Cardiac Enzymes: No results found for this basename: CKTOTAL, CKMB, CKMBINDEX, TROPONINI,  in the last 72 hours BNP: No results found for this basename: PROBNP,  in the last 72 hours D-Dimer: No results found for this basename: DDIMER,  in the last 72 hours CBG:  Recent Labs  12/26/13 1245 12/26/13 1611  GLUCAP 598* 562*   Hemoglobin A1C: No results found for this basename: HGBA1C,  in the last 72 hours Fasting Lipid Panel: No results found for this basename: CHOL, HDL, LDLCALC, TRIG, CHOLHDL, LDLDIRECT,  in the last 72 hours Thyroid Function Tests: No results found for this basename: TSH, T4TOTAL, FREET4, T3FREE, THYROIDAB,  in the last 72 hours Anemia Panel: No results found for this basename: VITAMINB12, FOLATE, FERRITIN, TIBC, IRON, RETICCTPCT,  in the last 72 hours Coagulation:  Recent Labs  12/26/13 1457  LABPROT 25.3*  INR 2.30*   Urine Drug Screen: Drugs of Abuse  No results found for this basename: labopia,  cocainscrnur,  labbenz,  amphetmu,  thcu,  labbarb    Alcohol Level: No results found for this basename: ETH,  in the last 72 hours Urinalysis:  Recent Labs  12/26/13 1235  COLORURINE YELLOW  LABSPEC 1.023  PHURINE 5.5  GLUCOSEU >1000*  HGBUR SMALL*  BILIRUBINUR NEGATIVE  KETONESUR NEGATIVE  PROTEINUR 100*  UROBILINOGEN 0.2  NITRITE NEGATIVE  LEUKOCYTESUR SMALL*   Misc. Labs: VBG pH 7.23, pCO2 51, pO2 33, bicarb 33  Imaging results:  No results found.  Other results: EKG: atrial fibrillation, right bundle branch block (noted on 06/2013 EKG)  Assessment & Plan by Problem: Active Problems:   Hyperkalemia   Hyperosmolar non-ketotic state in patient with type 2 diabetes mellitus  #Hyperosmolar  Hyperglycemic State: Patient has history of DM2 with last HgbA1c of 9.9 (09/2013). Her presenting blood glucose was 663 with a gap of 19. UA negative for ketones. This is likely in the setting of noncompliance. She began an insulin drip with normal saline. When her blood sugar is 250 she can be transitioned to D5 1/2 NS and when the gap is closed x 2 she can be transitioned to subcutaneous insulin. Home regimen is lantus 20u qhs, novolog 0-9 u TIDAC.  -CBG q1h -BMP q2h -insulin transition as noted above -NPO -strict I and O -HgbA1c -qualitative ketones -serum osmolality -lactic acid, salicylate for AG  #Hyperkalemia: Presenting potassium of 6.6 likely in setting of insulin noncompliance and CKD. In the ED, given bicarb, kayexalate, albuterol nebs, insulin drip per [facility] protocol. On admission patient given calcium gluconate. No peaked t waves on EKG. -BMP q2h  #CAD: Patient intermittently endorsed some left sided chest pain on interview which is not persistent but had denied it to all others prior. She was troponin negative in the ED. EKG unchanged from prior. -cycle troponin i x 3 -cardiac monitoring -hold ASA 81 mg po daily, carvedilol 6.25 mg po BID for now  #Atrial Fibrillation: Diagnosed in 08/2013 when brought to [facility] for subacute L occipital posterior cerebral artery infarct. She was started on warfarin and currently uses warfarin 7.5 mg po daily. INR 2.3 Last echo 09/26/2013 with EF 60-65%, no wall motion abnormalities, R ventricular systolic pressure increased. Warfarin 7.5 mg po daily -  Warfarin per pharmacy -will restart carvedilol 6.25 mg po BID   #h/o CVA: In  08/2013 she had difficulty w peripheral vision on right and slurred speech. One week later, she was unresponsive and brought to [facility] where brain MRI showed subacute L occipital posterior cerebral artery infarct. Carotid u/s had 40-59% proximal R ICA stenosis.  -hold ASA 81 mg po for now  #CKD stage IV:  Creatinine currently 2.2. Was 2.6-2.9 1 month ago. GFR now 21. -hold torsemide 40 mg po daily -cont to monitor  #Chronic Respiratory Failure: No respiratory complains when seen on exam as she was receiving albuterol nebs. Patient on albuterol 2.5 mg QID, budesonide 0.25 mg BID at home.  -currently receiving albuterol nebs to drive K into cells  #Dyslipidemia: Last lipid panel was cholesterol 96, triglycerides 66, HDL 49, LDL 34 on 07/02/2013. Patient currently on atorvastatin 80 mg po daily at home -hold atorvastatin for now  Dispo: Disposition is deferred at this time, awaiting improvement of current medical problems. Anticipated discharge in approximately 2 day(s).   The patient does have a current PCP Dione Housekeeper, MD) and does need an [facility] hospital follow-up appointment after discharge.  The patient does not know have transportation limitations that hinder transportation to clinic appointments.  Signed: Kelby Aline, MD 12/26/2013, 6:09 PM

## 2013-12-26 NOTE — ED Notes (Signed)
Iv therapy called for iv acess

## 2013-12-26 NOTE — ED Notes (Signed)
Pt states she has a hx tremors noted slight tremores bue also pt with discoloration to bue from red to brown in various stages

## 2013-12-26 NOTE — ED Notes (Signed)
resp therapy called

## 2013-12-26 NOTE — ED Notes (Signed)
Dr.Lockwood shown results of VBG. ED-Lab.

## 2013-12-26 NOTE — ED Notes (Signed)
Medics called to pts home by caseworker d/t pt with high glucose and not taking medications on a regular basis. Pt states she last took her insulin yesterday

## 2013-12-26 NOTE — ED Notes (Signed)
Per angela rn in stepdown pt to go to room on floor directly and she will start insulin drip on arrival now that have access

## 2013-12-26 NOTE — Progress Notes (Signed)
ANTICOAGULATION CONSULT NOTE - Initial Consult  Pharmacy Consult for warfarin Indication: atrial fibrillation  Allergies  Allergen Reactions  . Codeine Other (See Comments)    Childhood allergy    Patient Measurements: Height: 5\' 3"  (160 cm) Weight: 350 lb (158.759 kg) IBW/kg (Calculated) : 52.4  Vital Signs: Temp: 98.2 F (36.8 C) (07/24 1242) Temp src: Rectal (07/24 1242) BP: 149/113 mmHg (07/24 1430) Pulse Rate: 142 (07/24 1511)  Labs:  Recent Labs  12/26/13 1254 12/26/13 1457  HGB 11.2*  --   HCT 37.1  --   PLT 116*  --   LABPROT  --  25.3*  INR  --  2.30*  CREATININE 2.22*  --     Estimated Creatinine Clearance: 34.4 ml/min (by C-G formula based on Cr of 2.22).   Medical History: Past Medical History  Diagnosis Date  . Diabetes mellitus   . Hypertension   . Hyperlipemia   . Fibromyalgia   . Obesity   . Sleep apnea     No CPAP  . Anxiety   . Hiatal hernia   . NSTEMI (non-ST elevated myocardial infarction) 04/27/2011  . Anemia 04/27/2011  . Restless leg syndrome   . CAD (coronary artery disease)     small dominant right coronary artery with diffuse 95% stenosis. The LAD had 70% stenosis in the mid vessel beyond the diagonal branch but was a small vessel. Circumflex had proximal 30% stenosis.   Marland Kitchen External hemorrhoids   . Arrhythmia 10/11/2011  . Community acquired pneumonia 06/24/2012  . Renal insufficiency 05/06/2011  . Shingles   . CHF exacerbation 10/02/2013    Medications:  Prescriptions prior to admission  Medication Sig Dispense Refill  . acetaminophen (TYLENOL) 325 MG tablet Take 325 mg by mouth every 6 (six) hours as needed for moderate pain.      Marland Kitchen albuterol (PROVENTIL) (2.5 MG/3ML) 0.083% nebulizer solution Take 2.5 mg by nebulization 4 (four) times daily.       Marland Kitchen aspirin EC 81 MG tablet Take 81 mg by mouth daily.      . budesonide (PULMICORT) 0.25 MG/2ML nebulizer solution Take 2 mLs (0.25 mg total) by nebulization 2 (two) times daily.   60 mL  0  . carvedilol (COREG) 6.25 MG tablet Take 6.25 mg by mouth 2 (two) times daily with a meal.      . cholecalciferol (VITAMIN D) 1000 UNITS tablet Take 1,000 Units by mouth daily.      . Cyanocobalamin 500 MCG SUBL Place 500 mcg under the tongue daily.       . ferrous sulfate 325 (65 FE) MG tablet Take 325 mg by mouth daily with breakfast.       . insulin aspart (NOVOLOG) 100 UNIT/ML injection Inject 0-9 Units into the skin 3 (three) times daily with meals.  10 mL  11  . insulin glargine (LANTUS) 100 UNIT/ML injection Inject 0.2 mLs (20 Units total) into the skin at bedtime.  20 mL  11  . nitroGLYCERIN (NITROSTAT) 0.4 MG SL tablet Place 1 mg under the tongue every 5 (five) minutes x 3 doses as needed for chest pain. For chest pain      . Omega-3 Fatty Acids (FISH OIL PO) Take 1 capsule by mouth daily. Fish oil 1000mg  / Omega 300mg       . pregabalin (LYRICA) 150 MG capsule Take 150 mg by mouth 2 (two) times daily.      Marland Kitchen torsemide (DEMADEX) 20 MG tablet Take 2 tablets (40 mg  total) by mouth daily.  30 tablet  0  . warfarin (COUMADIN) 5 MG tablet Take 7.5 mg by mouth daily.      Marland Kitchen atorvastatin (LIPITOR) 80 MG tablet Take 80 mg by mouth daily.      . isosorbide mononitrate (IMDUR) 30 MG 24 hr tablet Take 30 mg by mouth daily.       Scheduled:  . sodium chloride   Intravenous STAT  . calcium gluconate  1 g Intravenous Once  . heparin  5,000 Units Subcutaneous 3 times per day  . insulin aspart  10 Units Intravenous Once   Infusions:  . sodium chloride    . sodium chloride    . dextrose 5 % and 0.45% NaCl    . insulin (NOVOLIN-R) infusion 5.4 Units/hr (12/26/13 1716)    Assessment: 73 yoF to resume warfarin for afib.  Home dose 7.5mg  daily and last dose taken on 7/23.  INR therapeutic on admission at 2.3.  Can likely continue home regimen.  Baseline Hgb and plt slightly low.  No bleeding noted.   Goal of Therapy:  INR 2-3 Monitor platelets by anticoagulation protocol: Yes   Plan:   Warfarin 7.5mg  tonight D/C heparin SQ Daily INR  Thank you, Vivia Ewing, PharmD Clinical Pharmacist - Resident Pager: [phone] Pharmacy: [phone] 12/26/2013 5:47 PM

## 2013-12-26 NOTE — ED Notes (Signed)
cbg [id number]

## 2013-12-26 NOTE — ED Notes (Signed)
IV THERAPIST IN ROOM

## 2013-12-26 NOTE — ED Provider Notes (Signed)
CSN: [id number]     Arrival date & time 12/26/13  1226 History   First MD Initiated Contact with Patient 12/26/13 1228     Chief Complaint  Patient presents with  . Hyperglycemia      HPI  Patient presents with multiple complaints.  It seems as though over the past 3 days she's developed generalized weakness, nausea, mild lightheadedness without focal chest or abdominal pain.  There is concurrent anorexia. Patient gives an inconsistent history of medication compliance. Per EMS, the patient isn't known to them, has been taken to other facilities multiple times for hyperglycemia, other complaints.  She is thought to be noncompliant with medication. Patient denies confusion, disorientation, syncope, vomiting, diarrhea, fever, chills. The patient's son and endorses that the patient has recently run out of soma medications, though it is unclear which ones.   Past Medical History  Diagnosis Date  . Diabetes mellitus   . Hypertension   . Hyperlipemia   . Fibromyalgia   . Obesity   . Sleep apnea     No CPAP  . Anxiety   . Hiatal hernia   . NSTEMI (non-ST elevated myocardial infarction) 04/27/2011  . Anemia 04/27/2011  . Restless leg syndrome   . CAD (coronary artery disease)     small dominant right coronary artery with diffuse 95% stenosis. The LAD had 70% stenosis in the mid vessel beyond the diagonal branch but was a small vessel. Circumflex had proximal 30% stenosis.   Marland Kitchen External hemorrhoids   . Arrhythmia 10/11/2011  . Community acquired pneumonia 06/24/2012  . Renal insufficiency 05/06/2011  . Shingles   . CHF exacerbation 10/02/2013   Past Surgical History  Procedure Laterality Date  . Appendectomy    . Fracture surgery      left humerous  . Ganglion cyst removal     Family History  Problem Relation Age of Onset  . Stomach cancer Mother   . Heart disease Maternal Grandmother   . Heart disease Maternal Grandfather   . Dementia Father    History  Substance Use Topics   . Smoking status: Never Smoker   . Smokeless tobacco: Never Used  . Alcohol Use: No   OB History   Grav Para Term Preterm Abortions TAB SAB Ect Mult Living                 Review of Systems  Constitutional:       Per HPI, otherwise negative  HENT:       Per HPI, otherwise negative  Respiratory:       Per HPI, otherwise negative  Cardiovascular:       Per HPI, otherwise negative  Gastrointestinal: Negative for vomiting.  Endocrine:       Negative aside from HPI  Genitourinary:       Neg aside from HPI   Musculoskeletal:       Per HPI, otherwise negative  Skin: Negative.   Neurological: Negative for syncope.      Allergies  Codeine  Home Medications   Prior to Admission medications   Medication Sig Start Date End Date Taking? Authorizing Provider  acetaminophen (TYLENOL) 325 MG tablet Take 325 mg by mouth every 6 (six) hours as needed for moderate pain.   Yes Historical Provider, MD  albuterol (PROVENTIL) (2.5 MG/3ML) 0.083% nebulizer solution Take 2.5 mg by nebulization 4 (four) times daily.    Yes Historical Provider, MD  aspirin EC 81 MG tablet Take 81 mg by mouth daily.  Yes Historical Provider, MD  budesonide (PULMICORT) 0.25 MG/2ML nebulizer solution Take 2 mLs (0.25 mg total) by nebulization 2 (two) times daily. 11/05/13  Yes Janece Canterbury, MD  carvedilol (COREG) 6.25 MG tablet Take 6.25 mg by mouth 2 (two) times daily with a meal.   Yes Historical Provider, MD  cholecalciferol (VITAMIN D) 1000 UNITS tablet Take 1,000 Units by mouth daily.   Yes Historical Provider, MD  Cyanocobalamin 500 MCG SUBL Place 500 mcg under the tongue daily.    Yes Historical Provider, MD  ferrous sulfate 325 (65 FE) MG tablet Take 325 mg by mouth daily with breakfast.    Yes Historical Provider, MD  insulin aspart (NOVOLOG) 100 UNIT/ML injection Inject 0-9 Units into the skin 3 (three) times daily with meals. 11/05/13  Yes Janece Canterbury, MD  insulin glargine (LANTUS) 100 UNIT/ML  injection Inject 0.2 mLs (20 Units total) into the skin at bedtime. 11/05/13  Yes Janece Canterbury, MD  nitroGLYCERIN (NITROSTAT) 0.4 MG SL tablet Place 1 mg under the tongue every 5 (five) minutes x 3 doses as needed for chest pain. For chest pain   Yes Historical Provider, MD  Omega-3 Fatty Acids (FISH OIL PO) Take 1 capsule by mouth daily. Fish oil 1000mg  / Omega 300mg    Yes Historical Provider, MD  pregabalin (LYRICA) 150 MG capsule Take 150 mg by mouth 2 (two) times daily.   Yes Historical Provider, MD  torsemide (DEMADEX) 20 MG tablet Take 2 tablets (40 mg total) by mouth daily. 11/05/13  Yes Janece Canterbury, MD  warfarin (COUMADIN) 5 MG tablet Take 7.5 mg by mouth daily.   Yes Historical Provider, MD  atorvastatin (LIPITOR) 80 MG tablet Take 80 mg by mouth daily.    Historical Provider, MD  isosorbide mononitrate (IMDUR) 30 MG 24 hr tablet Take 30 mg by mouth daily.    Historical Provider, MD   BP 149/113  Pulse 142  Temp(Src) 98.2 F (36.8 C) (Rectal)  Resp 21  Ht 5\' 3"  (1.6 m)  Wt 350 lb (158.759 kg)  BMI 62.02 kg/m2  SpO2 97% Physical Exam  Nursing note and vitals reviewed. Constitutional: She is oriented to person, place, and time.  Morbidly obese elderly female resting in bed  HENT:  Head: Normocephalic and atraumatic.  Eyes: Conjunctivae are normal. Right eye exhibits no discharge. Left eye exhibits no discharge.  Neck: No tracheal deviation present.  Cardiovascular: Normal rate and intact distal pulses.   No murmur heard. Pulmonary/Chest: Effort normal. No stridor. No respiratory distress.  Abdominal: Soft. She exhibits no distension. There is no tenderness.  Musculoskeletal: She exhibits edema.  Neurological: She is alert and oriented to person, place, and time. No cranial nerve deficit. She exhibits normal muscle tone. Coordination normal.  Skin: Skin is warm and dry.  Psychiatric: She has a normal mood and affect.    ED Course  Procedures (including critical care  time) Labs Review Labs Reviewed  COMPREHENSIVE METABOLIC PANEL - Abnormal; Notable for the following:    Sodium 134 (*)    Potassium 6.6 (*)    CO2 16 (*)    Glucose, Bld 663 (*)    BUN 65 (*)    Creatinine, Ser 2.22 (*)    Albumin 3.3 (*)    ALT 36 (*)    GFR calc non Af Amer 21 (*)    GFR calc Af Amer 24 (*)    Anion gap 19 (*)    All other components within normal limits  CBC  WITH DIFFERENTIAL - Abnormal; Notable for the following:    Hemoglobin 11.2 (*)    RDW 18.5 (*)    Platelets 116 (*)    All other components within normal limits  URINALYSIS, ROUTINE W REFLEX MICROSCOPIC - Abnormal; Notable for the following:    APPearance CLOUDY (*)    Glucose, UA >1000 (*)    Hgb urine dipstick SMALL (*)    Protein, ur 100 (*)    Leukocytes, UA SMALL (*)    All other components within normal limits  URINE MICROSCOPIC-ADD ON - Abnormal; Notable for the following:    Squamous Epithelial / LPF FEW (*)    Bacteria, UA MANY (*)    Casts GRANULAR CAST (*)    All other components within normal limits  PROTIME-INR - Abnormal; Notable for the following:    Prothrombin Time 25.3 (*)    INR 2.30 (*)    All other components within normal limits  I-STAT VENOUS BLOOD GAS, ED - Abnormal; Notable for the following:    pH, Ven 7.232 (*)    pCO2, Ven 50.9 (*)    Acid-base deficit 6.0 (*)    All other components within normal limits  CBG MONITORING, ED - Abnormal; Notable for the following:    Glucose-Capillary 598 (*)    All other components within normal limits  OSMOLALITY    EKG Interpretation   Date/Time:  Friday December 26 2013 15:31:10 EDT Ventricular Rate:  138 PR Interval:    QRS Duration: 125 QT Interval:  330 QTC Calculation: 500 R Axis:   98 Text Interpretation:  Atrial fibrillation Ventricular premature complex  RBBB and LPFB Atrial fibrillation Non-specific intra-ventricular  conduction delay Artifact Premature ventricular complexes Abnormal ekg  Confirmed by Carmin Muskrat  MD [phone]) on 12/26/2013 3:33:22 PM     Cardiac monitor shows atrial fibrillation, with a variable rate from 100-130  On exam the patient appears in a similar condition, in no distress.  Initial labs notable for hyperglycemia and acidosis end point of care testing. With the patient's history she received empiric fluids, and all preparing for insulin drip initiation, patient had return of remaining labs, demonstrating hyperkalemia as well. EKG does not demonstrate T wave changes, the patient is chest pain-free.  With atrial fibrillation as a concern, medication for hyperkalemia will be initiated, initially monitored for the need of additional interventions should her arrhythmia persist in spite of these interventions. MDM   Final diagnoses:  Hyperkalemia  Acidosis  Hyperglycemia  Atrial fibrillation, unspecified    Patient presents with multiple concerns, and slightly acidotic, hyperglycemic, hyperkalemic with atrial fibrillation.  Patient required initiation of both anti-hyperkalemia medications as well has insulin drip in the emergency department prior to admission to a step down unit  CRITICAL CARE Performed by: Carmin Muskrat Total critical care time: 40 Critical care time was exclusive of separately billable procedures and treating other patients. Critical care was necessary to treat or prevent imminent or life-threatening deterioration. Critical care was time spent personally by me on the following activities: development of treatment plan with patient and/or surrogate as well as nursing, discussions with consultants, evaluation of patient's response to treatment, examination of patient, obtaining history from patient or surrogate, ordering and performing treatments and interventions, ordering and review of laboratory studies, ordering and review of radiographic studies, pulse oximetry and re-evaluation of patient's condition.      Carmin Muskrat, MD 12/26/13 1537

## 2013-12-26 NOTE — Progress Notes (Signed)
Unit CM UR Completed by [facility] ED CM  W. Maxton Noreen RN  

## 2013-12-27 ENCOUNTER — Inpatient Hospital Stay (HOSPITAL_COMMUNITY): Payer: Medicare Other

## 2013-12-27 DIAGNOSIS — E870 Hyperosmolality and hypernatremia: Secondary | ICD-10-CM

## 2013-12-27 DIAGNOSIS — J9610 Chronic respiratory failure, unspecified whether with hypoxia or hypercapnia: Secondary | ICD-10-CM

## 2013-12-27 DIAGNOSIS — N184 Chronic kidney disease, stage 4 (severe): Secondary | ICD-10-CM

## 2013-12-27 DIAGNOSIS — E1101 Type 2 diabetes mellitus with hyperosmolarity with coma: Secondary | ICD-10-CM

## 2013-12-27 DIAGNOSIS — E785 Hyperlipidemia, unspecified: Secondary | ICD-10-CM

## 2013-12-27 DIAGNOSIS — I6350 Cerebral infarction due to unspecified occlusion or stenosis of unspecified cerebral artery: Secondary | ICD-10-CM

## 2013-12-27 DIAGNOSIS — Z8673 Personal history of transient ischemic attack (TIA), and cerebral infarction without residual deficits: Secondary | ICD-10-CM

## 2013-12-27 LAB — BASIC METABOLIC PANEL
ANION GAP: 14 (ref 5–15)
ANION GAP: 15 (ref 5–15)
BUN: 61 mg/dL — ABNORMAL HIGH (ref 6–23)
BUN: 61 mg/dL — ABNORMAL HIGH (ref 6–23)
CALCIUM: 8.6 mg/dL (ref 8.4–10.5)
CALCIUM: 8.7 mg/dL (ref 8.4–10.5)
CO2: 18 mEq/L — ABNORMAL LOW (ref 19–32)
CO2: 21 mEq/L (ref 19–32)
Chloride: 109 mEq/L (ref 96–112)
Chloride: 107 mEq/L (ref 96–112)
Creatinine, Ser: 2.23 mg/dL — ABNORMAL HIGH (ref 0.50–1.10)
Creatinine, Ser: 2.31 mg/dL — ABNORMAL HIGH (ref 0.50–1.10)
GFR calc Af Amer: 23 mL/min — ABNORMAL LOW (ref >90)
GFR, EST AFRICAN AMERICAN: 24 mL/min — AB (ref >90)
GFR, EST NON AFRICAN AMERICAN: 20 mL/min — AB (ref >90)
GFR, EST NON AFRICAN AMERICAN: 21 mL/min — AB (ref >90)
Glucose, Bld: 151 mg/dL — ABNORMAL HIGH (ref 70–99)
Glucose, Bld: 187 mg/dL — ABNORMAL HIGH (ref 70–99)
Potassium: 4.4 mEq/L (ref 3.7–5.3)
Potassium: 4.5 mEq/L (ref 3.7–5.3)
SODIUM: 141 meq/L (ref 137–147)
SODIUM: 143 meq/L (ref 137–147)

## 2013-12-27 LAB — GLUCOSE, CAPILLARY
Glucose-Capillary: 134 mg/dL — ABNORMAL HIGH (ref 70–99)
Glucose-Capillary: 166 mg/dL — ABNORMAL HIGH (ref 70–99)
Glucose-Capillary: 168 mg/dL — ABNORMAL HIGH (ref 70–99)
Glucose-Capillary: 170 mg/dL — ABNORMAL HIGH (ref 70–99)
Glucose-Capillary: 174 mg/dL — ABNORMAL HIGH (ref 70–99)
Glucose-Capillary: 179 mg/dL — ABNORMAL HIGH (ref 70–99)
Glucose-Capillary: 180 mg/dL — ABNORMAL HIGH (ref 70–99)
Glucose-Capillary: 208 mg/dL — ABNORMAL HIGH (ref 70–99)
Glucose-Capillary: 208 mg/dL — ABNORMAL HIGH (ref 70–99)
Glucose-Capillary: 220 mg/dL — ABNORMAL HIGH (ref 70–99)

## 2013-12-27 LAB — HEMOGLOBIN A1C
Hgb A1c MFr Bld: 9.8 % — ABNORMAL HIGH (ref <5.7)
Mean Plasma Glucose: 235 mg/dL — ABNORMAL HIGH (ref <117)

## 2013-12-27 LAB — PROTIME-INR
INR: 2.31 — ABNORMAL HIGH (ref 0.00–1.49)
Prothrombin Time: 25.4 seconds — ABNORMAL HIGH (ref 11.6–15.2)

## 2013-12-27 LAB — TROPONIN I: Troponin I: <0.3 ng/mL (ref <0.30)

## 2013-12-27 MED ORDER — DEXTROSE 5 % IV SOLN
1.0000 g | INTRAVENOUS | Status: DC
  Filled 2013-12-27: qty 10

## 2013-12-27 MED ORDER — WARFARIN SODIUM 7.5 MG PO TABS
7.5000 mg | ORAL_TABLET | Freq: Every day | ORAL | Status: DC
  Administered 2013-12-27 – 2013-12-29 (×3): 7.5 mg via ORAL
  Filled 2013-12-27 (×5): qty 1

## 2013-12-27 MED ORDER — INSULIN ASPART 100 UNIT/ML ~~LOC~~ SOLN: 0.0000 [IU] | Freq: Every day | SUBCUTANEOUS | Status: DC

## 2013-12-27 MED ORDER — SODIUM CHLORIDE 0.9 % IV SOLN
INTRAVENOUS | Status: DC
  Administered 2013-12-27 (×2): via INTRAVENOUS

## 2013-12-27 MED ORDER — INSULIN ASPART 100 UNIT/ML ~~LOC~~ SOLN
0.0000 [IU] | Freq: Three times a day (TID) | SUBCUTANEOUS | Status: DC
Start: 2013-12-27 — End: 2013-12-27

## 2013-12-27 MED ORDER — ASPIRIN EC 81 MG PO TBEC
81.0000 mg | DELAYED_RELEASE_TABLET | Freq: Every day | ORAL | Status: DC
  Administered 2013-12-27 – 2013-12-31 (×5): 81 mg via ORAL
  Filled 2013-12-27 (×6): qty 1

## 2013-12-27 MED ORDER — BUDESONIDE 0.25 MG/2ML IN SUSP
0.2500 mg | Freq: Two times a day (BID) | RESPIRATORY_TRACT | Status: DC
  Administered 2013-12-27 – 2013-12-31 (×7): 0.25 mg via RESPIRATORY_TRACT
  Filled 2013-12-27 (×10): qty 2

## 2013-12-27 MED ORDER — INSULIN ASPART 100 UNIT/ML ~~LOC~~ SOLN: 0.0000 [IU] | Freq: Three times a day (TID) | SUBCUTANEOUS | Status: DC

## 2013-12-27 MED ORDER — DEXTROSE 5 % IV SOLN
2.0000 g | INTRAVENOUS | Status: DC
  Administered 2013-12-27 – 2013-12-28 (×2): 2 g via INTRAVENOUS
  Filled 2013-12-27 (×3): qty 2

## 2013-12-27 MED ORDER — ATORVASTATIN CALCIUM 80 MG PO TABS
80.0000 mg | ORAL_TABLET | Freq: Every day | ORAL | Status: DC
  Administered 2013-12-27 – 2013-12-31 (×5): 80 mg via ORAL
  Filled 2013-12-27 (×6): qty 1

## 2013-12-27 MED ORDER — INSULIN ASPART 100 UNIT/ML ~~LOC~~ SOLN
0.0000 [IU] | Freq: Three times a day (TID) | SUBCUTANEOUS | Status: DC
  Administered 2013-12-27 – 2013-12-28 (×4): 3 [IU] via SUBCUTANEOUS
  Administered 2013-12-28: 2 [IU] via SUBCUTANEOUS
  Administered 2013-12-28: 7 [IU] via SUBCUTANEOUS
  Administered 2013-12-29 (×3): 3 [IU] via SUBCUTANEOUS
  Administered 2013-12-30 (×2): 2 [IU] via SUBCUTANEOUS
  Administered 2013-12-30: 3 [IU] via SUBCUTANEOUS
  Administered 2013-12-31: 2 [IU] via SUBCUTANEOUS

## 2013-12-27 MED ORDER — SODIUM CHLORIDE 0.9 % IV BOLUS (SEPSIS): 1000.0000 mL | Freq: Once | INTRAVENOUS | Status: DC

## 2013-12-27 MED ORDER — ACETAMINOPHEN 325 MG PO TABS
325.0000 mg | ORAL_TABLET | Freq: Four times a day (QID) | ORAL | Status: DC | PRN
  Administered 2013-12-27 – 2013-12-30 (×3): 325 mg via ORAL
  Filled 2013-12-27 (×3): qty 1

## 2013-12-27 MED ORDER — INSULIN GLARGINE 100 UNIT/ML ~~LOC~~ SOLN
20.0000 [IU] | Freq: Once | SUBCUTANEOUS | Status: AC
  Administered 2013-12-27: 20 [IU] via SUBCUTANEOUS
  Filled 2013-12-27: qty 0.2

## 2013-12-27 NOTE — Progress Notes (Signed)
Internal Medicine Attending Admission Note Date: 12/27/2013  Patient name: [name] Medical record number: [id number] Date of birth: 04-07-1941 Age: 73 y.o. Gender: female  I saw and evaluated the patient. I reviewed the resident's note and I agree with the resident's findings and plan as documented in the resident's note.  Chief Complaint(s): Generalized weakness and lightheadedness x3 days.  History - key components related to admission:  [name] is a 73 year old woman with a history of diabetes, atrial fibrillation, stage IV kidney disease, and cerebrovascular accident in March 2015, who presents with a three-day history of generalized weakness, nausea, and lightheadedness. Along with these symptoms she had polyuria and polydipsia but specifically denies dysuria. She claims to have been compliant with all of her medications and has no other signs or symptoms consistent with an infection. When she presented to the emergency department she was found to be hyperglycemic with sugars in the 600s, have a mild anion gap, and associated hyperkalemia. She was therefore admitted to the internal medicine teaching service for further evaluation and care.  On rounds this morning she was still fatigued secondary to a poor night sleep. She denied any shortness of breath but did admit to being thirsty.  Physical Exam - key components related to admission:  Filed Vitals:   12/27/13 0003 12/27/13 0424 12/27/13 0700 12/27/13 0840  BP: 103/77 119/56 120/83 105/73  Pulse: 133 131 138 124  Temp: 96.9 F (36.1 C) 97.9 F (36.6 C) 97.6 F (36.4 C)   TempSrc: Axillary Axillary Oral   Resp: 18  15 21   Height:      Weight:      SpO2: 95% 95% 99% 98%   Gen.: Well-developed, well-nourished, obese woman lying comfortably in bed in no acute distress. She was somnolent but arousable. HEENT: Dry mucous membranes Neck: Unable to determine jugular venous pressure secondary to body habitus Lungs: Clear to  auscultation anteriorly Heart: Tachycardic, irregularly irregular, no murmurs, rubs, or gallops appreciated Abdomen: Soft, nontender Extremities: Trace pitting edema to midshin Skin: Mildly decreased skin turgor  Lab results:  Basic Metabolic Panel:  Recent Labs  12/27/13 0037 12/27/13 0236  NA 141 143  K 4.4 4.5  CL 109 107  CO2 18* 21  GLUCOSE 151* 187*  BUN 61* 61*  CREATININE 2.23* 2.31*  CALCIUM 8.7 8.6   Liver Function Tests:  Recent Labs  12/26/13 1254  AST 23  ALT 36*  ALKPHOS 104  BILITOT 0.5  PROT 7.6  ALBUMIN 3.3*   CBC:  Recent Labs  12/26/13 1254 12/26/13 1614  WBC 9.5 8.3  NEUTROABS 6.9  --   HGB 11.2* 10.5*  HCT 37.1 34.9*  MCV 93.7 92.8  PLT 116* 118*   Cardiac Enzymes:  Recent Labs  12/26/13 1831 12/26/13 2202 12/27/13 0421  TROPONINI <0.30 <0.30 <0.30   CBG:  Recent Labs  12/27/13 0039 12/27/13 0135 12/27/13 0236 12/27/13 0338 12/27/13 0435 12/27/13 0801  GLUCAP 166* 168* 174* 179* 170* 208*   Hemoglobin A1C:  Recent Labs  12/26/13 1614  HGBA1C 9.8*   Coagulation:  Recent Labs  12/26/13 1457 12/27/13 0236  INR 2.30* 2.31*   Urinalysis:  Cloudy, greater than 1000 glucose, small hemoglobin, 100 protein, negative nitrite, small leukocytes, granular casts, 0-2 red blood cells per high-power field, too numerous to count white blood cells per high-power field.  Imaging results:  Dg Chest Port 1 View  12/26/2013   CLINICAL DATA:  Shortness of breath.  EXAM: PORTABLE CHEST -  1 VIEW  COMPARISON:  10/30/2013.  FINDINGS: 2036 hrs. The cardio pericardial silhouette is enlarged. Vascular congestion with interstitial pulmonary edema noted. There is alveolar opacity at the bases, right greater than left, likely reflecting asymmetric airspace edema. Bilateral pneumonia could also have this appearance. Small right pleural effusion noted. Telemetry leads overlie the chest.  IMPRESSION: Cardiomegaly with asymmetric airspace  disease, likely representing pulmonary edema although infection could have this appearance.  Small right pleural effusion.   Electronically Signed   By: Misty Stanley M.D.   On: 12/26/2013 20:44   Other results:  EKG: Atrial fibrillation at 138 beats per minute, right axis deviation, right bundle branch block, no Q waves or LVH by voltage, no ST-T or T wave changes. Unchanged from the previous ECG in January 2015.  Assessment & Plan by Problem:  [name] is a 73 year old woman with a history of diabetes, stage IV chronic kidney disease, atrial fibrillation, and recent cerebrovascular accident who presents with generalized fatigue, lightheadedness, and dizziness. She was found to be hyperglycemic, have a mild anion gap, and be hyperkalemic. She was also found to have a urinary tract infection. She was therefore diagnosed with hyperglycemia with mild ketoacidosis with the expected dehydration and hyperkalemia. She responded well to an insulin drip and moderate IV hydration. She was successfully converted to her chronic Lantus dose and has been eating since this morning. She remains tachycardic and thirsty suggesting she has not been fully volume resuscitated.  1) Hyperglycemia with mild anion gap: We will continue with her home Lantus dose and a sliding scale insulin coverage. We will be much more aggressive with our intravascular volume resuscitation and start normal saline at 250 cc an hour for 4 hours followed by normal saline at 150 cc per hour. Given her stage IV chronic kidney disease we will watch her closely clinically to make sure that she does not become fluid overloaded, but at this time her O2 sat on 2 L of oxygen was 97%. We will also obtain an orthostatic blood pressure to determine and follow her intravascular volume status. Finally we will treat what I believe is the precipitating event, namely the urinary tract infection with oral antibiotics.  2) Atrial fibrillation with rapid ventricular  rate: I believe this is likely secondary to intravascular volume depletion. We will continue telemetry and assess her pulse response to the aggressive IV fluid hydration. We are continuing her chronic nodal blocking agents. She will also be maintained on her home anticoagulation.  3) Disposition: She will require aggressive intravascular volume repletion today. If she responds well to this by decreasing her ventricular rate and maintains decent blood sugars on her home insulin regimen I suspect she may be ready for discharge home in the next 24-48 hours.

## 2013-12-27 NOTE — Progress Notes (Signed)
ANTICOAGULATION CONSULT NOTE - Follow Up Consult  Pharmacy Consult for coumadin Indication: atrial fibrillation  Allergies  Allergen Reactions  . Codeine Other (See Comments)    Childhood allergy    Patient Measurements: Height: 5\' 3"  (160 cm) Weight: 350 lb (158.759 kg) IBW/kg (Calculated) : 52.4  Vital Signs: Temp: 97.9 F (36.6 C) (07/25 0424) Temp src: Axillary (07/25 0424) BP: 119/56 mmHg (07/25 0424) Pulse Rate: 131 (07/25 0424)  Labs:  Recent Labs  12/26/13 1254 12/26/13 1457 12/26/13 1614 12/26/13 1831  12/26/13 2202 12/27/13 0037 12/27/13 0236 12/27/13 0421  HGB 11.2*  --  10.5*  --   --   --   --   --   --   HCT 37.1  --  34.9*  --   --   --   --   --   --   PLT 116*  --  118*  --   --   --   --   --   --   LABPROT  --  25.3*  --   --   --   --   --  25.4*  --   INR  --  2.30*  --   --   --   --   --  2.31*  --   CREATININE 2.22*  --   --  2.23*  < > 2.27* 2.23* 2.31*  --   TROPONINI  --   --   --  <0.30  --  <0.30  --   --  <0.30  < > = values in this interval not displayed.  Estimated Creatinine Clearance: 33 ml/min (by C-G formula based on Cr of 2.31).   Medications:  Scheduled:  . antiseptic oral rinse  15 mL Mouth Rinse BID  . carvedilol  6.25 mg Oral BID WC  . Chlorhexidine Gluconate Cloth  6 each Topical Q0600  . insulin aspart  0-9 Units Subcutaneous TID WC  . insulin aspart  10 Units Intravenous Once  . mupirocin ointment  1 application Nasal BID  . pregabalin  150 mg Oral BID  . torsemide  40 mg Oral Daily  . Warfarin - Pharmacist Dosing Inpatient   Does not apply q1800    Assessment: 73 yo female here with hyperosmolar non-ketotic state on coumadin for afib and INR at goal (INR= 2.31).  Home coumadin dose: 7.5mg /day  Goal of Therapy:  INR 2-3 Monitor platelets by anticoagulation protocol: Yes   Plan:  -Coumadin 7.5mg /day -Daily PT/INR for now  Hildred Laser, Pharm D 12/27/2013 7:29 AM

## 2013-12-27 NOTE — Progress Notes (Signed)
ANTICOAGULATION CONSULT NOTE - Follow Up Consult  Pharmacy Consult for coumadin Indication: atrial fibrillation  Allergies  Allergen Reactions  . Codeine Other (See Comments)    Childhood allergy    Patient Measurements: Height: 5\' 3"  (160 cm) Weight: 350 lb (158.759 kg) IBW/kg (Calculated) : 52.4  Vital Signs: Temp: 97.6 F (36.4 C) (07/25 0700) Temp src: Oral (07/25 0700) BP: 105/73 mmHg (07/25 0840) Pulse Rate: 124 (07/25 0840)  Labs:  Recent Labs  12/26/13 1254 12/26/13 1457 12/26/13 1614 12/26/13 1831  12/26/13 2202 12/27/13 0037 12/27/13 0236 12/27/13 0421  HGB 11.2*  --  10.5*  --   --   --   --   --   --   HCT 37.1  --  34.9*  --   --   --   --   --   --   PLT 116*  --  118*  --   --   --   --   --   --   LABPROT  --  25.3*  --   --   --   --   --  25.4*  --   INR  --  2.30*  --   --   --   --   --  2.31*  --   CREATININE 2.22*  --   --  2.23*  < > 2.27* 2.23* 2.31*  --   TROPONINI  --   --   --  <0.30  --  <0.30  --   --  <0.30  < > = values in this interval not displayed.  Estimated Creatinine Clearance: 33 ml/min (by C-G formula based on Cr of 2.31).   Medications:  Scheduled:  . antiseptic oral rinse  15 mL Mouth Rinse BID  . aspirin EC  81 mg Oral Daily  . atorvastatin  80 mg Oral Daily  . budesonide  0.25 mg Nebulization BID  . carvedilol  6.25 mg Oral BID WC  . cefTRIAXone (ROCEPHIN)  IV  1 g Intravenous Q24H  . Chlorhexidine Gluconate Cloth  6 each Topical Q0600  . insulin aspart  0-9 Units Subcutaneous TID WC  . insulin aspart  10 Units Intravenous Once  . mupirocin ointment  1 application Nasal BID  . pregabalin  150 mg Oral BID  . warfarin  7.5 mg Oral q1800  . Warfarin - Pharmacist Dosing Inpatient   Does not apply q1800    Assessment: 73 yo female here with hyperosmolar non-ketotic state on coumadin for afib and INR at goal (INR= 2.31).  Home coumadin dose: 7.5mg /day  Goal of Therapy:  INR 2-3 Monitor platelets by  anticoagulation protocol: Yes   Plan:  -Coumadin 7.5mg /day -Daily PT/INR for now  Hildred Laser, Pharm D 12/27/2013 10:28 AM   Addendum: rocephin  Pharmacy asked to dose rocephin for UTI. Wt noted 158kg  7/25 rocephin>>  7/25 urine  Plan -Rocephin 2gm IV q24hr -Will follow cultures and clinical progress  Hildred Laser, Pharm D 12/27/2013 10:30 AM

## 2013-12-27 NOTE — Progress Notes (Signed)
Subjective: [name] was transitioned off the insulin drip last night onto sensitive SSI after receiving her home lantus 20 u qhs, and off her fluids. Night team was called that she was tachycardic in the 130s and she received metoprolol 2.5 mg IV x 2. This morning on interview she continued to be somnolent and still complained about drowziness. She also complained about her throat being dry and polydipsia.   Objective: Vital signs in last 24 hours: Filed Vitals:   12/27/13 0003 12/27/13 0424 12/27/13 0700 12/27/13 0840  BP: 103/77 119/56 120/83 105/73  Pulse: 133 131 138 124  Temp: 96.9 F (36.1 C) 97.9 F (36.6 C) 97.6 F (36.4 C)   TempSrc: Axillary Axillary Oral   Resp: 18  15 21   Height:      Weight:      SpO2: 95% 95% 99% 98%   Weight change:   Intake/Output Summary (Last 24 hours) at 12/27/13 1025 Last data filed at 12/27/13 0900  Gross per 24 hour  Intake 2053.01 ml  Output    600 ml  Net 1453.01 ml   BP 105/73  Pulse 124  Temp(Src) 97.6 F (36.4 C) (Oral)  Resp 21  Ht 5\' 3"  (1.6 m)  Wt 350 lb (158.759 kg)  BMI 62.02 kg/m2  SpO2 98%  General Appearance:    Obese, somnolent, no distress, appears stated age  HEENT:    Normocephalic, dry MM, PERRL  Neck:   No carotid bruit or JVD  Back:     Symmetric, no curvature  Lungs:     Clear to auscultation bilaterally but difficult to assess from habitus   Heart:    Tachycardic, irregularly irregular rate and rhythm, S1 and S2 normal, no murmur, rub   or gallop  Abdomen:     Soft, non-tender, bowel sounds active all four quadrants,    no masses, no organomegaly  Extremities:   Extremities normal, atraumatic, no cyanosis. 0-1+ b/l pitting edema in LE  Pulses:   2+ and symmetric radial and DP pulses   Lab Results: Basic Metabolic Panel:  Recent Labs Lab 12/27/13 0037 12/27/13 0236  NA 141 143  K 4.4 4.5  CL 109 107  CO2 18* 21  GLUCOSE 151* 187*  BUN 61* 61*  CREATININE 2.23* 2.31*  CALCIUM 8.7 8.6    Liver Function Tests:  Recent Labs Lab 12/26/13 1254  AST 23  ALT 36*  ALKPHOS 104  BILITOT 0.5  PROT 7.6  ALBUMIN 3.3*   No results found for this basename: LIPASE, AMYLASE,  in the last 168 hours No results found for this basename: AMMONIA,  in the last 168 hours CBC:  Recent Labs Lab 12/26/13 1254 12/26/13 1614  WBC 9.5 8.3  NEUTROABS 6.9  --   HGB 11.2* 10.5*  HCT 37.1 34.9*  MCV 93.7 92.8  PLT 116* 118*   Cardiac Enzymes:  Recent Labs Lab 12/26/13 1831 12/26/13 2202 12/27/13 0421  TROPONINI <0.30 <0.30 <0.30   BNP: No results found for this basename: PROBNP,  in the last 168 hours D-Dimer: No results found for this basename: DDIMER,  in the last 168 hours CBG:  Recent Labs Lab 12/27/13 0039 12/27/13 0135 12/27/13 0236 12/27/13 0338 12/27/13 0435 12/27/13 0801  GLUCAP 166* 168* 174* 179* 170* 208*   Hemoglobin A1C:  Recent Labs Lab 12/26/13 1614  HGBA1C 9.8*   Fasting Lipid Panel: No results found for this basename: CHOL, HDL, LDLCALC, TRIG, CHOLHDL, LDLDIRECT,  in the last  168 hours Thyroid Function Tests: No results found for this basename: TSH, T4TOTAL, FREET4, T3FREE, THYROIDAB,  in the last 168 hours Coagulation:  Recent Labs Lab 12/26/13 1457 12/27/13 0236  LABPROT 25.3* 25.4*  INR 2.30* 2.31*   Anemia Panel: No results found for this basename: VITAMINB12, FOLATE, FERRITIN, TIBC, IRON, RETICCTPCT,  in the last 168 hours Urine Drug Screen: Drugs of Abuse  No results found for this basename: labopia,  cocainscrnur,  labbenz,  amphetmu,  thcu,  labbarb    Alcohol Level: No results found for this basename: ETH,  in the last 168 hours Urinalysis:  Recent Labs Lab 12/26/13 1235  COLORURINE YELLOW  LABSPEC 1.023  PHURINE 5.5  GLUCOSEU >1000*  HGBUR SMALL*  BILIRUBINUR NEGATIVE  KETONESUR NEGATIVE  PROTEINUR 100*  UROBILINOGEN 0.2  NITRITE NEGATIVE  LEUKOCYTESUR SMALL*   Misc. Labs: Qualitative ketones  negative Lactic acid 2.2 Salicylate level <6.6  Micro Results: Recent Results (from the past 240 hour(s))  MRSA PCR SCREENING     Status: Abnormal   Collection Time    12/26/13  4:20 PM      Result Value Ref Range Status   MRSA by PCR POSITIVE (*) NEGATIVE Final   Comment:            The GeneXpert MRSA Assay (FDA     approved for NASAL specimens     only), is one component of a     comprehensive MRSA colonization     surveillance program. It is not     intended to diagnose MRSA     infection nor to guide or     monitor treatment for     MRSA infections.     RESULT CALLED TO, READ BACK BY AND VERIFIED WITHDulcy Fanny RN 0630 12/26/13 A BROWNING   Studies/Results: Dg Chest Port 1 View  12/26/2013   CLINICAL DATA:  Shortness of breath.  EXAM: PORTABLE CHEST - 1 VIEW  COMPARISON:  10/30/2013.  FINDINGS: 2036 hrs. The cardio pericardial silhouette is enlarged. Vascular congestion with interstitial pulmonary edema noted. There is alveolar opacity at the bases, right greater than left, likely reflecting asymmetric airspace edema. Bilateral pneumonia could also have this appearance. Small right pleural effusion noted. Telemetry leads overlie the chest.  IMPRESSION: Cardiomegaly with asymmetric airspace disease, likely representing pulmonary edema although infection could have this appearance.  Small right pleural effusion.   Electronically Signed   By: Misty Stanley M.D.   On: 12/26/2013 20:44   Medications: I have reviewed the patient's current medications. Scheduled Meds: . antiseptic oral rinse  15 mL Mouth Rinse BID  . aspirin EC  81 mg Oral Daily  . atorvastatin  80 mg Oral Daily  . budesonide  0.25 mg Nebulization BID  . carvedilol  6.25 mg Oral BID WC  . Chlorhexidine Gluconate Cloth  6 each Topical Q0600  . insulin aspart  0-9 Units Subcutaneous TID WC  . insulin aspart  10 Units Intravenous Once  . mupirocin ointment  1 application Nasal BID  . pregabalin  150 mg Oral BID   . warfarin  7.5 mg Oral q1800  . Warfarin - Pharmacist Dosing Inpatient   Does not apply q1800   Continuous Infusions: . sodium chloride 200 mL/hr at 12/27/13 0842   PRN Meds:.acetaminophen, dextrose Assessment/Plan: Active Problems:   Hyperkalemia   Hyperosmolar non-ketotic state in patient with type 2 diabetes mellitus  1.Hyperosmolar Hyperglycemic State: Patient admitted with BG>600, anion gap  of 19 and potassium of 6.6 likely in the setting of DM2 medical noncompliance. Overnight she was successfully weaned from her insulin drip to her basal lantus 20 u qhs and SSSI-S. Her fluids were discontinued and she appears dry on exam. This is likely causing her tachycardia as she needs additional fluid resuscitation. Her CBG are now in the 170s-200s and her K is down to 4.5. This may have been precipitated by UTI as she has small LE and many bacteria on microscopy. -carb modified diet -lantus 20u qhs, SSI-sensitive -NS @ 250 cc/hr x 4 hr and then NS @ 150 cc/hr -CBG TIDAC + QHS  -hold furosemide until fluid resusitated -UCx and rocephin per pharmacy  2.CAD: Patient intermittently endorsed some left sided chest pain on interview which is not persistent but had denied it to all others prior. Troponin negative x 3EKG unchanged from prior.  -cont ASA 81 mg po daily, carvedilol 6.25 mg po BID  3.Atrial Fibrillation w RVR: Likely exacerbated by volume depletion. Will monitor vitals in response to fluid resusitation. 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 She was started on warfarin and currently uses warfarin 7.5 mg po daily. INR 2.3 Last echo 09/26/2013 with EF 60-65%, no wall motion abnormalities, R ventricular systolic pressure increased. Warfarin 7.5 mg po daily  - Warfarin per pharmacy  -carvedilol 6.25 mg po BID  -cardiac monitoring  4.h/o CVA: In 08/2013 she had difficulty w peripheral vision on right and  slurred speech. 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 Carotid u/s had 40-59% proximal R ICA stenosis.  -cont ASA 81 mg po for now   5.CKD stage IV: Creatinine currently 2.3. Was 2.6-2.9 1 month ago. GFR now 20.  -hold torsemide 40 mg po daily for now while volume down -cont to monitor   6.Chronic Respiratory Failure: No respiratory complains this am. Patient on albuterol 2.5 mg QID, budesonide 0.25 mg BID at home.  -restart home albuterol and pulmicort   7.Dyslipidemia: Last lipid panel was cholesterol 96, triglycerides 66, HDL 49, LDL 34 on 07/02/2013. Patient currently on atorvastatin 80 mg po daily at home  -restart atorvastatin  Dispo: Disposition is deferred at this time, awaiting improvement of current medical problems.  Anticipated discharge in approximately 2 day(s).   The patient does have a current PCP Dione Housekeeper, MD) and does need an [facility] hospital follow-up appointment after discharge.  The patient does not know have transportation limitations that hinder transportation to clinic appointments.  .Services Needed at time of discharge: Y = Yes, Blank = No PT:   OT:   RN:   Equipment:   Other:     LOS: 1 day   Kelby Aline, MD 12/27/2013, 10:25 AM

## 2013-12-28 ENCOUNTER — Inpatient Hospital Stay (HOSPITAL_COMMUNITY): Payer: Medicare Other

## 2013-12-28 LAB — GLUCOSE, CAPILLARY
GLUCOSE-CAPILLARY: 168 mg/dL — AB (ref 70–99)
GLUCOSE-CAPILLARY: 234 mg/dL — AB (ref 70–99)
Glucose-Capillary: 237 mg/dL — ABNORMAL HIGH (ref 70–99)
Glucose-Capillary: 306 mg/dL — ABNORMAL HIGH (ref 70–99)

## 2013-12-28 LAB — BASIC METABOLIC PANEL
Anion gap: 14 (ref 5–15)
BUN: 59 mg/dL — ABNORMAL HIGH (ref 6–23)
CO2: 20 meq/L (ref 19–32)
CREATININE: 2.62 mg/dL — AB (ref 0.50–1.10)
Calcium: 8.2 mg/dL — ABNORMAL LOW (ref 8.4–10.5)
Chloride: 110 mEq/L (ref 96–112)
GFR calc Af Amer: 20 mL/min — ABNORMAL LOW (ref >90)
GFR calc non Af Amer: 17 mL/min — ABNORMAL LOW (ref >90)
GLUCOSE: 176 mg/dL — AB (ref 70–99)
Potassium: 4.7 mEq/L (ref 3.7–5.3)
Sodium: 144 mEq/L (ref 137–147)

## 2013-12-28 LAB — CBC
HEMATOCRIT: 35.2 % — AB (ref 36.0–46.0)
Hemoglobin: 10.6 g/dL — ABNORMAL LOW (ref 12.0–15.0)
MCH: 28.6 pg (ref 26.0–34.0)
MCHC: 30.1 g/dL (ref 30.0–36.0)
MCV: 94.9 fL (ref 78.0–100.0)
Platelets: 111 10*3/uL — ABNORMAL LOW (ref 150–400)
RBC: 3.71 MIL/uL — AB (ref 3.87–5.11)
RDW: 19.2 % — ABNORMAL HIGH (ref 11.5–15.5)
WBC: 7.2 10*3/uL (ref 4.0–10.5)

## 2013-12-28 LAB — URINE CULTURE

## 2013-12-28 LAB — PROTIME-INR
INR: 2.57 — ABNORMAL HIGH (ref 0.00–1.49)
Prothrombin Time: 27.6 seconds — ABNORMAL HIGH (ref 11.6–15.2)

## 2013-12-28 LAB — TROPONIN I: Troponin I: <0.3 ng/mL (ref <0.30)

## 2013-12-28 MED ORDER — CARVEDILOL 12.5 MG PO TABS
12.5000 mg | ORAL_TABLET | Freq: Two times a day (BID) | ORAL | Status: DC
  Administered 2013-12-28 – 2013-12-31 (×6): 12.5 mg via ORAL
  Filled 2013-12-28 (×9): qty 1

## 2013-12-28 MED ORDER — FUROSEMIDE 10 MG/ML IJ SOLN: 80.0000 mg | Freq: Once | INTRAMUSCULAR | Status: DC

## 2013-12-28 MED ORDER — CEPHALEXIN 500 MG PO CAPS
500.0000 mg | ORAL_CAPSULE | Freq: Two times a day (BID) | ORAL | Status: DC
  Administered 2013-12-28 – 2013-12-29 (×3): 500 mg via ORAL
  Filled 2013-12-28 (×4): qty 1

## 2013-12-28 MED ORDER — TORSEMIDE 20 MG PO TABS
40.0000 mg | ORAL_TABLET | Freq: Once | ORAL | Status: AC
  Administered 2013-12-28: 40 mg via ORAL
  Filled 2013-12-28: qty 2

## 2013-12-28 MED ORDER — CARVEDILOL 6.25 MG PO TABS
6.2500 mg | ORAL_TABLET | Freq: Once | ORAL | Status: AC
  Administered 2013-12-28: 6.25 mg via ORAL
  Filled 2013-12-28: qty 1

## 2013-12-28 NOTE — Progress Notes (Signed)
Pt's visitor came to the Nurses desk to inform this RN that the pt was c/o chest pain. RN's asked pt further details about the chest pain and description. Pt states it does not radiate anywhere else beyond her chest. The MD was informed, and EKG was done with results given to the MD.

## 2013-12-28 NOTE — Progress Notes (Signed)
Subjective: Pt continues to be in HR of 130s this AM. Despite having better rates overnight in the 110s. Pt is asymptomatic this AM and feels much better after fluid resucitation. Eating regular meals w/o problem.   Objective: Vital signs in last 24 hours: Filed Vitals:   12/27/13 2112 12/27/13 2303 12/28/13 0011 12/28/13 0417  BP: 123/79  108/73 107/78  Pulse: 119  122 106  Temp: 97.5 F (36.4 C)  97.1 F (36.2 C) 97.1 F (36.2 C)  TempSrc: Axillary  Axillary Axillary  Resp: 17  9 15   Height:      Weight:      SpO2: 97% 94% 93% 97%   Weight change:   Intake/Output Summary (Last 24 hours) at 12/28/13 0711 Last data filed at 12/28/13 0500  Gross per 24 hour  Intake 3214.17 ml  Output    350 ml  Net 2864.17 ml   BP 107/78  Pulse 106  Temp(Src) 97.1 F (36.2 C) (Axillary)  Resp 15  Ht 5\' 3"  (1.6 m)  Wt 350 lb (158.759 kg)  BMI 62.02 kg/m2  SpO2 97%  General Appearance:    Obese, somnolent, no distress, appears stated age  HEENT:    Normocephalic, dry MM, PERRL  Neck:   No carotid bruit or JVD  Back:     Symmetric, no curvature  Lungs:     Clear to auscultation bilaterally but difficult to assess from habitus and very poor effort by patient    Heart:    Tachycardic, irregularly irregular rate and rhythm, S1 and S2 normal, no murmur, rub   or gallop  Abdomen:     Soft, non-tender, bowel sounds active all four quadrants,    no masses, no organomegaly  Extremities:   Extremities normal, atraumatic, no cyanosis. 0-1+ b/l pitting edema in LE  Pulses:   2+ and symmetric radial and DP pulses   Lab Results: Basic Metabolic Panel:  Recent Labs Lab 12/27/13 0037 12/27/13 0236  NA 141 143  K 4.4 4.5  CL 109 107  CO2 18* 21  GLUCOSE 151* 187*  BUN 61* 61*  CREATININE 2.23* 2.31*  CALCIUM 8.7 8.6   Liver Function Tests:  Recent Labs Lab 12/26/13 1254  AST 23  ALT 36*  ALKPHOS 104  BILITOT 0.5  PROT 7.6  ALBUMIN 3.3*   CBC:  Recent Labs Lab  12/26/13 1254 12/26/13 1614  WBC 9.5 8.3  NEUTROABS 6.9  --   HGB 11.2* 10.5*  HCT 37.1 34.9*  MCV 93.7 92.8  PLT 116* 118*   Cardiac Enzymes:  Recent Labs Lab 12/26/13 1831 12/26/13 2202 12/27/13 0421  TROPONINI <0.30 <0.30 <0.30   CBG:  Recent Labs Lab 12/27/13 0338 12/27/13 0435 12/27/13 0801 12/27/13 1216 12/27/13 1639 12/27/13 2151  GLUCAP 179* 170* 208* 220* 208* 180*   Hemoglobin A1C:  Recent Labs Lab 12/26/13 1614  HGBA1C 9.8*   Coagulation:  Recent Labs Lab 12/26/13 1457 12/27/13 0236  LABPROT 25.3* 25.4*  INR 2.30* 2.31*   Urinalysis:  Recent Labs Lab 12/26/13 1235  COLORURINE YELLOW  LABSPEC 1.023  PHURINE 5.5  GLUCOSEU >1000*  HGBUR SMALL*  BILIRUBINUR NEGATIVE  KETONESUR NEGATIVE  PROTEINUR 100*  UROBILINOGEN 0.2  NITRITE NEGATIVE  LEUKOCYTESUR SMALL*   Misc. Labs: Qualitative ketones negative Lactic acid 2.2 Salicylate level <0.9  Micro Results: Recent Results (from the past 240 hour(s))  MRSA PCR SCREENING     Status: Abnormal   Collection Time    12/26/13  4:20 PM      Result Value Ref Range Status   MRSA by PCR POSITIVE (*) NEGATIVE Final   Comment:            The GeneXpert MRSA Assay (FDA     approved for NASAL specimens     only), is one component of a     comprehensive MRSA colonization     surveillance program. It is not     intended to diagnose MRSA     infection nor to guide or     monitor treatment for     MRSA infections.     RESULT CALLED TO, READ BACK BY AND VERIFIED WITH:     Dulcy Fanny RN [id number] 12/26/13 A BROWNING   Studies/Results: Dg Chest 2 View  12/27/2013   CLINICAL DATA:  Rales  EXAM: CHEST  2 VIEW  COMPARISON:  December 26, 2013  FINDINGS: There is stable cardiac enlargement. There is mild pulmonary venous hypertension. There is no appreciable interstitial edema. There is, however, consolidation in the right base medially. No effusions are appreciable. No adenopathy.  IMPRESSION: Evidence of volume  overload with cardiomegaly and pulmonary venous hypertension. There is consolidation in the medial right base, most likely due to pneumonia, although asymmetric edema could present in this manner. Left lung appears clear.   Electronically Signed   By: Lowella Grip M.D.   On: 12/27/2013 15:37   Dg Chest Port 1 View  12/26/2013   CLINICAL DATA:  Shortness of breath.  EXAM: PORTABLE CHEST - 1 VIEW  COMPARISON:  10/30/2013.  FINDINGS: 2036 hrs. The cardio pericardial silhouette is enlarged. Vascular congestion with interstitial pulmonary edema noted. There is alveolar opacity at the bases, right greater than left, likely reflecting asymmetric airspace edema. Bilateral pneumonia could also have this appearance. Small right pleural effusion noted. Telemetry leads overlie the chest.  IMPRESSION: Cardiomegaly with asymmetric airspace disease, likely representing pulmonary edema although infection could have this appearance.  Small right pleural effusion.   Electronically Signed   By: Misty Stanley M.D.   On: 12/26/2013 20:44   Medications: I have reviewed the patient's current medications. Scheduled Meds: . antiseptic oral rinse  15 mL Mouth Rinse BID  . aspirin EC  81 mg Oral Daily  . atorvastatin  80 mg Oral Daily  . budesonide  0.25 mg Nebulization BID  . carvedilol  6.25 mg Oral BID WC  . cefTRIAXone (ROCEPHIN)  IV  2 g Intravenous Q24H  . Chlorhexidine Gluconate Cloth  6 each Topical Q0600  . insulin aspart  0-9 Units Subcutaneous TID WC  . insulin aspart  10 Units Intravenous Once  . mupirocin ointment  1 application Nasal BID  . warfarin  7.5 mg Oral q1800  . Warfarin - Pharmacist Dosing Inpatient   Does not apply q1800   Continuous Infusions: . sodium chloride 125 mL/hr at 12/27/13 1754   PRN Meds:.acetaminophen, dextrose Assessment/Plan: # Atrial Fibrillation w RVR: Likely exacerbated by volume depletion in setting of HHS that has now resolved. 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 Pt anticoagulated with warfarin INR 2.3 Last echo 09/26/2013 with EF 60-65%, no wall motion abnormalities, R ventricular systolic pressure increased. Orthostatics were negative.  - Warfarin per pharmacy  -carvedilol 6.25 mg po BID >>increase to 12.5 mg BID  -cardiac monitoring -will d/c IVF -hold torsemide for today unless becomes fluid overloaded plan to restart on 7/27  #  Hyperosmolar Hyperglycemic State-resolved: Patient admitted with BG>600, anion gap of 19 and potassium of 6.6 likely in the setting of DM2 medical noncompliance. Infectious workup revealed UTI and CXR with possible PNA vs fluid. Very poor quality CXR images with portable and underpenetrated 2view.   -carb modified diet -lantus 20u qhs, SSI-sensitive -CBG TIDAC + QHS  -UCx pending -IV rocephin   # CAD: Patient intermittently endorsed some left sided chest pain on interview which is not persistent but had denied it to all others prior. Troponin negative x 3EKG unchanged from prior.  -cont ASA 81 mg po daily, carvedilol increased as per above  #h/o CVA: In 08/2013 she had difficulty w peripheral vision on right and slurred speech. 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 Carotid u/s had 40-59% proximal R ICA stenosis.  -cont ASA 81 mg po for now   # CKD stage IV: Creatinine currently 2.3. Was 2.6-2.9 1 month ago. GFR now 20.  -hold torsemide 40 mg po daily for now while volume down -cont to monitor   # Chronic Respiratory Failure: No respiratory complains this am. Patient on albuterol 2.5 mg QID, budesonide 0.25 mg BID at home.  -cont albuterol and pulmicort   # Dyslipidemia: Last lipid panel was cholesterol 96, triglycerides 66, HDL 49, LDL 34 on 07/02/2013. Patient currently on atorvastatin 80 mg po daily at home  -cont  atorvastatin  Dispo: Disposition is deferred at this time, awaiting improvement of current medical problems.  Anticipated discharge in approximately 2 day(s).   The patient does have a current PCP Dione Housekeeper, MD) and does need an [facility] hospital follow-up appointment after discharge.  The patient does not know have transportation limitations that hinder transportation to clinic appointments.  .Services Needed at time of discharge: Y = Yes, Blank = No PT:   OT:   RN:   Equipment:   Other:     LOS: 2 days   Clinton Gallant, MD 12/28/2013, 7:11 AM

## 2013-12-28 NOTE — Progress Notes (Signed)
ANTICOAGULATION CONSULT NOTE - Follow Up Consult  Pharmacy Consult for coumadin Indication: atrial fibrillation  Allergies  Allergen Reactions  . Codeine Other (See Comments)    Childhood allergy    Patient Measurements: Height: 5\' 3"  (160 cm) Weight:  (bed scale error) IBW/kg (Calculated) : 52.4  Vital Signs: Temp: 97.7 F (36.5 C) (07/26 0811) Temp src: Oral (07/26 0811) BP: 150/67 mmHg (07/26 0811) Pulse Rate: 134 (07/26 0811)  Labs:  Recent Labs  12/26/13 1254 12/26/13 1457 12/26/13 1614 12/26/13 1831  12/26/13 2202 12/27/13 0037 12/27/13 0236 12/27/13 0421 12/28/13 0725  HGB 11.2*  --  10.5*  --   --   --   --   --   --  10.6*  HCT 37.1  --  34.9*  --   --   --   --   --   --  35.2*  PLT 116*  --  118*  --   --   --   --   --   --  111*  LABPROT  --  25.3*  --   --   --   --   --  25.4*  --  27.6*  INR  --  2.30*  --   --   --   --   --  2.31*  --  2.57*  CREATININE 2.22*  --   --  2.23*  < > 2.27* 2.23* 2.31*  --  2.62*  TROPONINI  --   --   --  <0.30  --  <0.30  --   --  <0.30  --   < > = values in this interval not displayed.  Estimated Creatinine Clearance: 29.1 ml/min (by C-G formula based on Cr of 2.62).   Medications:  Scheduled:  . antiseptic oral rinse  15 mL Mouth Rinse BID  . aspirin EC  81 mg Oral Daily  . atorvastatin  80 mg Oral Daily  . budesonide  0.25 mg Nebulization BID  . carvedilol  6.25 mg Oral BID WC  . cefTRIAXone (ROCEPHIN)  IV  2 g Intravenous Q24H  . Chlorhexidine Gluconate Cloth  6 each Topical Q0600  . insulin aspart  0-9 Units Subcutaneous TID WC  . insulin aspart  10 Units Intravenous Once  . mupirocin ointment  1 application Nasal BID  . warfarin  7.5 mg Oral q1800  . Warfarin - Pharmacist Dosing Inpatient   Does not apply q1800    Assessment: 73 yo female here with hyperosmolar non-ketotic state on coumadin for afib and INR at goal (INR= 2.57).  Home coumadin dose: 7.5mg /day  Goal of Therapy:  INR 2-3 Monitor  platelets by anticoagulation protocol: Yes   Plan:  -Coumadin 7.5mg /day -Daily PT/INR   Hildred Laser, Pharm D 12/28/2013 9:39 AM

## 2013-12-28 NOTE — Progress Notes (Signed)
Internal Medicine Attending  Date: 12/28/2013  Patient name: [name] Medical record number: [id number] Date of birth: Oct 13, 1940 Age: 73 y.o. Gender: female  I saw and evaluated the patient. I developed the assessment and plan with the housestaff and reviewed the resident's note by Dr. Algis Liming.  I agree with the resident's findings and plans as documented in her progress note.  When I saw [name] this morning she was complaining of some shortness of breath. She was sitting comfortably on the edge of the bed with the assistance of the nurses. Pulmonary examination revealed faint bibasilar inspiratory crackles with a poor inspiratory result. My impression is that she is now euvolemic or slightly hypervolemic after the volume resuscitation. She may benefit from a single dose of IV Lasix and then reinstitution of her chronic oral loop diuretic. Her tachycardia improved overnight with the volume resuscitation but she could benefit from slight escalation of her nodal blocking agents, which is the plan today. We will continue the antibiotics for her urinary tract infection. I believe she needs at least another 24 hours in the step down unit while we try to gain control of her heart rate with the nodal blocking agents and establishment of euvolemia. Once we get control of her heart rate she will require some physical therapy. Therefore, her anticipated discharge is delayed to probably 48-72 hours from now.

## 2013-12-28 NOTE — Progress Notes (Signed)
S: was called that pt having some SOB and possible chest pain. Pt reports feeling more SOB then at baseline and only intermittent substernal CP with inspiration.   O: HR 110-120s, O2 96% on 3L, BP 130/84 General: sitting up at side of bed, NAD HEENT: PERRL, EOMI, no scleral icterus Cardiac: irregularly irregular tachy, no rubs, murmurs or gallops Pulm: intermittent crackles at the bases, decreased BS at low bases, no rhonchi or upper airway noise, moving normal volumes of air on 3L O2 [place] (pt was moved in the bed and able to lie flat with good O2 and no exacerbation of dyspnea) Abd: soft, nontender, nondistended, BS present Ext: warm and well perfused, no pedal edema Neuro: alert and oriented X3, intermittently sleepy but easily awaken and redirected, cranial nerves II-XII grossly intact  A/P: - EKG performed and reviewed just afib with HR in 130s no new ST changes from baseline -CXR repeat -trop x1 -pt lost PIV access therefore either lasix 80mg  IV once if access restored or torsemide 40mg  po (at home dose) NOT BOTH -foley for adequate I/O measurements -nursing staff and family updated at bedside   Clinton Gallant, MD Pgr: [phone]

## 2013-12-29 DIAGNOSIS — I2510 Atherosclerotic heart disease of native coronary artery without angina pectoris: Secondary | ICD-10-CM

## 2013-12-29 DIAGNOSIS — E875 Hyperkalemia: Secondary | ICD-10-CM

## 2013-12-29 DIAGNOSIS — I4891 Unspecified atrial fibrillation: Secondary | ICD-10-CM

## 2013-12-29 LAB — PROTIME-INR
INR: 3.03 — ABNORMAL HIGH (ref 0.00–1.49)
PROTHROMBIN TIME: 31.4 s — AB (ref 11.6–15.2)

## 2013-12-29 LAB — GLUCOSE, CAPILLARY
GLUCOSE-CAPILLARY: 207 mg/dL — AB (ref 70–99)
GLUCOSE-CAPILLARY: 209 mg/dL — AB (ref 70–99)
Glucose-Capillary: 231 mg/dL — ABNORMAL HIGH (ref 70–99)
Glucose-Capillary: 227 mg/dL — ABNORMAL HIGH (ref 70–99)

## 2013-12-29 MED ORDER — INSULIN GLARGINE 100 UNIT/ML ~~LOC~~ SOLN
20.0000 [IU] | Freq: Every day | SUBCUTANEOUS | Status: DC
  Administered 2013-12-29 – 2013-12-30 (×2): 20 [IU] via SUBCUTANEOUS
  Filled 2013-12-29 (×4): qty 0.2

## 2013-12-29 NOTE — Evaluation (Signed)
Physical Therapy Evaluation Patient Details Name: [name] MRN: [id number] DOB: 1940-10-28 Today's Date: 12/29/2013   History of Present Illness  Pt is a 73 y/o female admitted with hyperglycemia. Her blood sugar when she arrived here was 598 on CBG and 663 on BMP. Pt has been home 2 weeks from STR at the SNF level prior to returning this admission.  Clinical Impression  Pt admitted with the above. Pt currently with functional limitations due to the deficits listed below (see PT Problem List). At the time of PT eval pt was able to perform sit<>stand transfer and pregait activity with RW for support. Further gait training deferred due to safety of no +2 assist, as nursing reports pt needs +2 for mobility. Pt will benefit from skilled PT to increase their independence and safety with mobility to allow discharge to the venue listed below.       Follow Up Recommendations Home health PT;Supervision/Assistance - 24 hour    Equipment Recommendations  None recommended by PT    Recommendations for Other Services       Precautions / Restrictions Precautions Precautions: Fall Precaution Comments: Reports has had one fall since returning home from SNF 2 weeks ago, Restrictions Weight Bearing Restrictions: No      Mobility  Bed Mobility               General bed mobility comments: Pt was received sitting in recliner. Per RN report, pt required +2 assist.  Transfers Overall transfer level: Needs assistance Equipment used: Rolling walker (2 wheeled) Transfers: Sit to/from Stand Sit to Stand: Mod assist         General transfer comment: Pt was able to stand from low recliner chair with x2 attempts and mod assist from therapist. Pt was cued for forward translation to assist with weight shifting, as well as for Improved posture when achieved full stand.   Ambulation/Gait Ambulation/Gait assistance: Min guard Ambulation Distance (Feet): 2 Feet Assistive device: Rolling walker (2  wheeled) Gait Pattern/deviations: Step-to pattern;Decreased stride length;Trunk flexed Gait velocity: Decreased Gait velocity interpretation: Below normal speed for age/gender General Gait Details: Pt was able to demonstrate pregait activity of marching in place, and then was able to take steps forward and backwards from chair..Close guard for safety, however pt did not have any LOB.   Stairs            Wheelchair Mobility    Modified Rankin (Stroke Patients Only)       Balance Overall balance assessment: Needs assistance Sitting-balance support: Feet supported;No upper extremity supported Sitting balance-Leahy Scale: Fair     Standing balance support: No upper extremity supported Standing balance-Leahy Scale: Poor Standing balance comment: Pt was only able to hold static standing without UE support for ~10 seconds.                              Pertinent Vitals/Pain Vitals stable throughout session.     Home Living Family/patient expects to be discharged to:: Private residence Living Arrangements: Children Available Help at Discharge: Family;Available PRN/intermittently Type of Home: House Home Access: Ramped entrance     Home Layout: One level Home Equipment: Walker - 2 wheels;Cane - single point;Wheelchair - manual;Shower seat;Bedside commode (Hemi-walker) Additional Comments: Pt lives with sons however noone will be available to stay with her during the day. Trying to find assist for daytime.     Prior Function Level of Independence: Independent with assistive  device(s)         Comments: ~2 days PTA needed assist with ADL's, before that independent with RW. Been home 2 weeks from SNF PTA.      Hand Dominance   Dominant Hand: Right    Extremity/Trunk Assessment   Upper Extremity Assessment: Defer to OT evaluation           Lower Extremity Assessment: Generalized weakness      Cervical / Trunk Assessment: Kyphotic  Communication    Communication: No difficulties  Cognition Arousal/Alertness: Lethargic Behavior During Therapy: Flat affect Overall Cognitive Status: Within Functional Limits for tasks assessed                      General Comments      Exercises        Assessment/Plan    PT Assessment Patient needs continued PT services  PT Diagnosis Difficulty walking;Generalized weakness   PT Problem List Decreased strength;Decreased range of motion;Decreased activity tolerance;Decreased balance;Decreased mobility;Decreased knowledge of use of DME;Decreased safety awareness;Decreased knowledge of precautions;Cardiopulmonary status limiting activity  PT Treatment Interventions DME instruction;Gait training;Stair training;Functional mobility training;Therapeutic activities;Therapeutic exercise;Neuromuscular re-education;Patient/family education   PT Goals (Current goals can be found in the Care Plan section) Acute Rehab PT Goals Patient Stated Goal: To return home and be independent PT Goal Formulation: With patient Time For Goal Achievement: 01/12/14 Potential to Achieve Goals: Good    Frequency Min 3X/week   Barriers to discharge Decreased caregiver support Pt trying to find support for home during the day when sons are working.     Co-evaluation               End of Session Equipment Utilized During Treatment: Gait belt;Oxygen Activity Tolerance: Patient tolerated treatment well Patient left: in chair;with chair alarm set;with call bell/phone within reach Nurse Communication: Mobility status         Time: 1610-9604 PT Time Calculation (min): 22 min   Charges:   PT Evaluation $Initial PT Evaluation Tier I: 1 Procedure PT Treatments $Therapeutic Activity: 8-22 mins   PT G Codes:          Jolyn Lent 12/29/2013, 12:59 PM  Jolyn Lent, PT, DPT Acute Rehabilitation Services Pager: [phone]

## 2013-12-29 NOTE — Progress Notes (Signed)
Subjective: NAEON. Pt HR of 100s-110s this AM. Patient had no complaints this morning other than her chronic leg pain. She was sitting upright in her chair on interview.  Objective: Vital signs in last 24 hours: Filed Vitals:   12/29/13 0006 12/29/13 0504 12/29/13 0754 12/29/13 0944  BP: 93/54 142/81 101/72   Pulse: 109 75 123   Temp: 97.9 F (36.6 C) 97.6 F (36.4 C) 97.7 F (36.5 C)   TempSrc: Oral Oral Oral   Resp: 20 16 14    Height:      Weight:      SpO2: 100% 97% 98% 99%   Weight change:   Intake/Output Summary (Last 24 hours) at 12/29/13 1038 Last data filed at 12/29/13 0500  Gross per 24 hour  Intake    340 ml  Output   1500 ml  Net  -1160 ml   BP 101/72  Pulse 123  Temp(Src) 97.7 F (36.5 C) (Oral)  Resp 14  Ht 5\' 3"  (1.6 m)  Wt 350 lb (158.759 kg)  BMI 62.02 kg/m2  SpO2 99%  General Appearance:    Obese, alert sitting upright in chair, no distress, appears stated age  HEENT:    Normocephalic, dry MM, PERRL  Neck:   No carotid bruit or JVD  Back:     Symmetric, no curvature  Lungs:     Clear to auscultation bilaterally but difficult to assess from habitus and very poor effort by patient    Heart:    Tachycardic, irregularly irregular rate and rhythm, S1 and S2 normal, no murmur, rub   or gallop  Abdomen:     Soft, non-tender, bowel sounds active all four quadrants,    no masses, no organomegaly  Extremities:   Extremities normal, atraumatic, no cyanosis. 0-1+ b/l pitting edema in LE unchanged  Pulses:   2+ and symmetric radial and DP pulses   Lab Results: Basic Metabolic Panel:  Recent Labs Lab 12/27/13 0236 12/28/13 0725  NA 143 144  K 4.5 4.7  CL 107 110  CO2 21 20  GLUCOSE 187* 176*  BUN 61* 59*  CREATININE 2.31* 2.62*  CALCIUM 8.6 8.2*   Liver Function Tests:  Recent Labs Lab 12/26/13 1254  AST 23  ALT 36*  ALKPHOS 104  BILITOT 0.5  PROT 7.6  ALBUMIN 3.3*   CBC:  Recent Labs Lab 12/26/13 1254 12/26/13 1614  12/28/13 0725  WBC 9.5 8.3 7.2  NEUTROABS 6.9  --   --   HGB 11.2* 10.5* 10.6*  HCT 37.1 34.9* 35.2*  MCV 93.7 92.8 94.9  PLT 116* 118* 111*   Cardiac Enzymes:  Recent Labs Lab 12/26/13 2202 12/27/13 0421 12/28/13 1305  TROPONINI <0.30 <0.30 <0.30   CBG:  Recent Labs Lab 12/27/13 2151 12/28/13 0817 12/28/13 1124 12/28/13 1603 12/28/13 2137 12/29/13 0755  GLUCAP 180* 168* 237* 306* 234* 207*   Hemoglobin A1C:  Recent Labs Lab 12/26/13 1614  HGBA1C 9.8*   Coagulation:  Recent Labs Lab 12/26/13 1457 12/27/13 0236 12/28/13 0725 12/29/13 0839  LABPROT 25.3* 25.4* 27.6* 31.4*  INR 2.30* 2.31* 2.57* 3.03*   Urinalysis:  Recent Labs Lab 12/26/13 1235  COLORURINE YELLOW  LABSPEC 1.023  PHURINE 5.5  GLUCOSEU >1000*  HGBUR SMALL*  BILIRUBINUR NEGATIVE  KETONESUR NEGATIVE  PROTEINUR 100*  UROBILINOGEN 0.2  NITRITE NEGATIVE  LEUKOCYTESUR SMALL*   Misc. Labs: Qualitative ketones negative Lactic acid 2.2 Salicylate level <4.6  Micro Results: Recent Results (from the past 240 hour(s))  MRSA PCR SCREENING     Status: Abnormal   Collection Time    12/26/13  4:20 PM      Result Value Ref Range Status   MRSA by PCR POSITIVE (*) NEGATIVE Final   Comment:            The GeneXpert MRSA Assay (FDA     approved for NASAL specimens     only), is one component of a     comprehensive MRSA colonization     surveillance program. It is not     intended to diagnose MRSA     infection nor to guide or     monitor treatment for     MRSA infections.     RESULT CALLED TO, READ BACK BY AND VERIFIED WITH:     Dulcy Fanny RN 2148 12/26/13 A BROWNING  URINE CULTURE     Status: None   Collection Time    12/27/13  2:37 PM      Result Value Ref Range Status   Specimen Description URINE, CLEAN CATCH   Final   Special Requests NONE   Final   Culture  Setup Time     Final   Value: 12/27/2013 21:12     Performed at [facility]     Final   Value:  95,000 COLONIES/ML     Performed at [facility]   Culture     Final   Value: Multiple bacterial morphotypes present, none predominant. Suggest appropriate recollection if clinically indicated.     Performed at [facility]   Report Status 12/28/2013 FINAL   Final   Studies/Results: Dg Chest 2 View  12/27/2013   CLINICAL DATA:  [place]  EXAM: CHEST  2 VIEW  COMPARISON:  December 26, 2013  FINDINGS: There is stable cardiac enlargement. There is mild pulmonary venous hypertension. There is no appreciable interstitial edema. There is, however, consolidation in the right base medially. No effusions are appreciable. No adenopathy.  IMPRESSION: Evidence of volume overload with cardiomegaly and pulmonary venous hypertension. There is consolidation in the medial right base, most likely due to pneumonia, although asymmetric edema could present in this manner. Left lung appears clear.   Electronically Signed   By: Lowella Grip M.D.   On: 12/27/2013 15:37   Dg Chest Port 1 View  12/28/2013   CLINICAL DATA:  73 year old female with shortness of breath and congestion.  EXAM: PORTABLE CHEST - 1 VIEW  COMPARISON:  12/27/2013 and prior chest radiographs  FINDINGS: Cardiomegaly, pulmonary vascular congestion and mild interstitial pulmonary edema noted.  There may be a small right pleural effusion present.  There is no evidence of pneumothorax or acute bony abnormality.  IMPRESSION: Cardiomegaly with pulmonary vascular congestion and mild interstitial pulmonary edema.  Question small right pleural effusion.   Electronically Signed   By: Hassan Rowan M.D.   On: 12/28/2013 13:28   Medications: I have reviewed the patient's current medications. Scheduled Meds: . antiseptic oral rinse  15 mL Mouth Rinse BID  . aspirin EC  81 mg Oral Daily  . atorvastatin  80 mg Oral Daily  . budesonide  0.25 mg Nebulization BID  . carvedilol  12.5 mg Oral BID WC  . Chlorhexidine Gluconate Cloth  6 each Topical Q0600  .  furosemide  80 mg Intravenous Once  . insulin aspart  0-9 Units Subcutaneous TID WC  . insulin aspart  10 Units Intravenous Once  . insulin glargine  20 Units Subcutaneous QHS  . mupirocin ointment  1 application Nasal BID  . warfarin  7.5 mg Oral q1800  . Warfarin - Pharmacist Dosing Inpatient   Does not apply q1800   Continuous Infusions:   PRN Meds:.acetaminophen, dextrose Assessment/Plan: # Atrial Fibrillation w RVR: Likely exacerbated by volume depletion in setting of HHS that has now resolved. 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 Pt anticoagulated with warfarin INR 2.6 yesterday. Last echo 09/26/2013 with EF 60-65%, no wall motion abnormalities, R ventricular systolic pressure increased. Orthostatics were negative. HR is lower today in the 100s-110s after increase in her beta blocker yesterday. - Warfarin per pharmacy  -carvedilol 12.5 mg po BID  -cardiac monitoring -transfer to tele -hold torsemide for now as she appeared dry on exam -consider additional IV hydration  # Hyperosmolar Hyperglycemic State-resolved: Patient admitted with BG>600, anion gap of 19 and potassium of 6.6 likely in the setting of DM2 medical noncompliance. Infectious workup revealed UA small LE UCx multiple bacterial morphotypes and CXR with possible PNA vs fluid. Very poor quality CXR images with portable and underpenetrated 2view. CBG 200s-230s this morning and last night  -carb modified diet -lantus 20u qhs, SSI-sensitive -CBG TIDAC + QHS  -UCx multiple bacterial morphotypes, none predominant 95,000 colonies, so d/c keflex  # CAD: Patient intermittently endorsed some left sided chest pain on initial interview which is not persistent but had denied it to all others prior. Troponin negative x 3EKG unchanged from prior.  -cont ASA 81 mg po daily, carvedilol increased as per above  #h/o CVA: In 08/2013 she had difficulty w  peripheral vision on right and slurred speech. 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 Carotid u/s had 40-59% proximal R ICA stenosis.  -cont ASA 81 mg po for now   # CKD stage IV: Creatinine 2.6 yesterday. Was 2.6-2.9 1 month ago. GFR now 17.  -hold torsemide 40 mg po daily for now while volume down -cont to monitor   # Chronic Respiratory Failure: No respiratory complains this am. Patient on albuterol 2.5 mg QID, budesonide 0.25 mg BID at home.  -cont albuterol and pulmicort   # Dyslipidemia: Last lipid panel was cholesterol 96, triglycerides 66, HDL 49, LDL 34 on 07/02/2013. Patient currently on atorvastatin 80 mg po daily at home  -cont atorvastatin  Dispo: Disposition is deferred at this time, awaiting improvement of current medical problems.  Anticipated discharge in approximately 2 day(s).   The patient does have a current PCP Dione Housekeeper, MD) and does need an [facility] hospital follow-up appointment after discharge.  The patient does not know have transportation limitations that hinder transportation to clinic appointments.  .Services Needed at time of discharge: Y = Yes, Blank = No PT:   OT:   RN:   Equipment:   Other:     LOS: 3 days   Kelby Aline, MD 12/29/2013, 10:38 AM

## 2013-12-29 NOTE — Progress Notes (Signed)
12/29/2013 1500 Report received from [facility]. Questions addressed.  Zariah Cavendish, Arville Lime

## 2013-12-29 NOTE — Discharge Summary (Signed)
Name: [name] MRN: [id number] DOB: [birth year] 73 y.o. PCP: Dione Housekeeper, MD  Date of Admission: 12/26/2013 12:26 PM Date of Discharge: 12/31/2013 Attending Physician: Madilyn Fireman, MD  Discharge Diagnosis:  Principal Problem:   Atrial fibrillation with rapid ventricular response Active Problems:   CKD (chronic kidney disease), stage IV   Hyperkalemia   DM type 2, uncontrolled, with neuropathy   Atrial fibrillation   Chronic respiratory failure   Hyperosmolar non-ketotic state in patient with type 2 diabetes mellitus  Discharge Medications:   Medication List    STOP taking these medications       isosorbide mononitrate 30 MG 24 hr tablet  Commonly known as:  IMDUR     pregabalin 150 MG capsule  Commonly known as:  LYRICA      TAKE these medications       acetaminophen 325 MG tablet  Commonly known as:  TYLENOL  Take 325 mg by mouth every 6 (six) hours as needed for moderate pain.     albuterol (2.5 MG/3ML) 0.083% nebulizer solution  Commonly known as:  PROVENTIL  Take 2.5 mg by nebulization 4 (four) times daily.     aspirin EC 81 MG tablet  Take 81 mg by mouth daily.     atorvastatin 80 MG tablet  Commonly known as:  LIPITOR  Take 80 mg by mouth daily.     budesonide 0.25 MG/2ML nebulizer solution  Commonly known as:  PULMICORT  Take 2 mLs (0.25 mg total) by nebulization 2 (two) times daily.     carvedilol 12.5 MG tablet  Commonly known as:  COREG  Take 1 tablet (12.5 mg total) by mouth 2 (two) times daily with a meal.     cholecalciferol 1000 UNITS tablet  Commonly known as:  VITAMIN D  Take 1,000 Units by mouth daily.     Cyanocobalamin 500 MCG Subl  Place 500 mcg under the tongue daily.     ferrous sulfate 325 (65 FE) MG tablet  Take 325 mg by mouth daily with breakfast.     FISH OIL PO  Take 1 capsule by mouth daily. Fish oil 1000mg  / Omega 300mg      insulin aspart 100 UNIT/ML injection  Commonly known as:  novoLOG  Inject 0-9  Units into the skin 3 (three) times daily with meals.     insulin glargine 100 UNIT/ML injection  Commonly known as:  LANTUS  Inject 0.2 mLs (20 Units total) into the skin at bedtime.     nitroGLYCERIN 0.4 MG SL tablet  Commonly known as:  NITROSTAT  Place 1 mg under the tongue every 5 (five) minutes x 3 doses as needed for chest pain. For chest pain     torsemide 10 MG tablet  Commonly known as:  DEMADEX  Take 1 tablet (10 mg total) by mouth daily.     warfarin 5 MG tablet  Commonly known as:  COUMADIN  Take 7.5 mg by mouth daily.        Disposition and follow-up:   [name] was discharged from [facility] in Stable condition.  At the hospital follow up visit please address:  1.  Medication adherence and diet, resumption of lyrica, torsemide dose, imdur dose  2.  Labs / imaging needed at time of follow-up: BMP checked tomorrow for K and creatinine  3.  Pending labs/ test needing follow-up: none  Follow-up Appointments: Follow-up Information   Follow up with Sherrie Mustache, MD On 01/02/2014. (@  10:30 am)    Specialty:  Family Medicine   Contact information:   [address] [place] [address] [phone]       Follow up with Lyda Jester, PA-C On 01/13/2014. (@3 :45 pm)    Specialty:  Cardiology   Contact information:   [address] [place] [address] [phone]       Discharge Instructions: Discharge Instructions   Diet - low sodium heart healthy    Complete by:  As directed      Diet - low sodium heart healthy    Complete by:  As directed      Increase activity slowly    Complete by:  As directed      Increase activity slowly    Complete by:  As directed           Procedures Performed:  Dg Chest 2 View  12/27/2013   CLINICAL DATA:  [place]  EXAM: CHEST  2 VIEW  COMPARISON:  December 26, 2013  FINDINGS: There is stable cardiac enlargement. There is mild pulmonary venous hypertension. There is no  appreciable interstitial edema. There is, however, consolidation in the right base medially. No effusions are appreciable. No adenopathy.  IMPRESSION: Evidence of volume overload with cardiomegaly and pulmonary venous hypertension. There is consolidation in the medial right base, most likely due to pneumonia, although asymmetric edema could present in this manner. Left lung appears clear.   Electronically Signed   By: Lowella Grip M.D.   On: 12/27/2013 15:37   Dg Chest Port 1 View  12/28/2013   CLINICAL DATA:  73 year old female with shortness of breath and congestion.  EXAM: PORTABLE CHEST - 1 VIEW  COMPARISON:  12/27/2013 and prior chest radiographs  FINDINGS: Cardiomegaly, pulmonary vascular congestion and mild interstitial pulmonary edema noted.  There may be a small right pleural effusion present.  There is no evidence of pneumothorax or acute bony abnormality.  IMPRESSION: Cardiomegaly with pulmonary vascular congestion and mild interstitial pulmonary edema.  Question small right pleural effusion.   Electronically Signed   By: Hassan Rowan M.D.   On: 12/28/2013 13:28   Dg Chest Port 1 View  12/26/2013   CLINICAL DATA:  Shortness of breath.  EXAM: PORTABLE CHEST - 1 VIEW  COMPARISON:  10/30/2013.  FINDINGS: 2036 hrs. The cardio pericardial silhouette is enlarged. Vascular congestion with interstitial pulmonary edema noted. There is alveolar opacity at the bases, right greater than left, likely reflecting asymmetric airspace edema. Bilateral pneumonia could also have this appearance. Small right pleural effusion noted. Telemetry leads overlie the chest.  IMPRESSION: Cardiomegaly with asymmetric airspace disease, likely representing pulmonary edema although infection could have this appearance.  Small right pleural effusion.   Electronically Signed   By: Misty Stanley M.D.   On: 12/26/2013 20:44    Admission HPI: [name] is a 73 year old woman with DM2, atrial fibrillation, h/o CVA in 08/2013, CKD stage  IV, chronic respiratory failure, Patient was a poor historian. She said she was sleepy and was getting continuous nebulizer treatments during the interview and exam. Per the EMR, she has had 3 days of general weakness, nausea, and lightheadedness. EMS was called to [name]'s house when she had a high glucose reading. She did not remember what her home insulin regimen is (20 u lantus, 0-9 u novolog TIDAC), but insisted that she never misses any medications. Her blood sugar when she arrived here was 598 on CBG and 663 on BMP. She was  started on 1 L NS with an insulin drip and begun on albuterol nubulizer treatment for a K of 6.6. She reported dizziness, L sided chest pressure (but denied it to the ED), SOB, and polyuria. She denied loss of consciousness, cough, nausea, emesis, dysuria, or vision changes.  Hospital Course by problem list: Principal Problem:   Atrial fibrillation with rapid ventricular response Active Problems:   CKD (chronic kidney disease), stage IV   Hyperkalemia   DM type 2, uncontrolled, with neuropathy   Atrial fibrillation   Chronic respiratory failure   Hyperosmolar non-ketotic state in patient with type 2 diabetes mellitus    # Hyperosmolar Hyperglycemic State-resolved: [name] was admitted with BG>600, anion gap of 19 and potassium of 6.6 but no ketones in urine, likely in the setting of DM2 medical noncompliance. Infectious workup as a trigger revealed a UA with small LE and many bacteria but urine culture with multiple bacterial morphotypes and CXR with possible PNA vs fluid. She received rocephin and then keflex while urine culture was pending but antibiotics were discontinued on 7/27 as she was asympomatic with nonspecific urine culture. She responded well to an insulin drip and moderate IV hydration. She was converted to her home lantus 20 u qhs and sensitive sliding scale insulin on hospital day 1 after her gap closed and her CBGs were 170s-200s. She was also fluid  depleted and required NS hydration with her home torsemide held. She had some dizziness with standing from his dehydration and so she was sent to SNF with torsemide reduced to 10 mg po daily and will be reassessed by PCP. She worked with PT/OT who recommended SNF. Her orthostatic BP on day of discharge was 116/79 lying 134/83 sitting. Patient is on lyrica for neuropathic pain. This was held in the setting of her somnolence and possible prolonged QTc and should be reassessed as an outpatient by PCP.  # Atrial Fibrillation w RVR: Her atrial fibrillation was likely exacerbated by volume depletion in setting of HHS that has now resolved. She was diagnosed with a fib in 08/2013 when brought to [facility] for subacute L occipital posterior cerebral artery infarct. She was on therapeutic warfarin per pharmacy throughout her stay. Her last echo on 09/26/2013 demonstrated EF 60-65%, no wall motion abnormalities, and R ventricular systolic pressure increased. [name] orthostatics were negative. The night team was called early 7/24 am as she was tachycardic in the 130s and she received metprolol 2.5 mg IV x 2. She was originally on her home coreg 6.25 mg po BID which was increased to 12.5 mg BID as she was still in the 130s. With fluid resuscitation and increase in her beta blocker, she was brought down to the 90s-100s. She was on cardiac monitoring during her stay.  # CAD: [name] intermittently endorsed some left sided chest pain on initial interview which is not persistent but had denied it to all others prior. Her troponin was negative x 3, her EKG unchanged from prior, and she was on cardiac monitoring. The morning of 7/28 she complained of intermittent sharp pleuritic leftsided/substernal chest pain. EKG was unchanged, troponins were cycled x 3 and negative, and repeat CXR was unchanged. She continued her daily ASA 81 and carvedilol was increased as per above.  #h/o CVA: In 08/2013 [name] had difficulty  with peripheral vision on right and slurred speech. One week later, she was unresponsive and brought to [facility] where brain MRI showed subacute L occipital posterior cerebral artery infarct.  Carotid u/s had 40-59% proximal R ICA stenosis. She was continued on her daily ASA 81.  # Acute on CKD stage IV: [name]'s creatinine trended up from 2.2 to 2.7 as she became dehydrated. The morning of discharge she had good po intake and her creatinine was down to 2.3-2.4. It was 2.6-2.9 1 month ago. She complained on 7/26 about some SOB and received her home torsemide 40 mg po. On 7/27, she appeared dry on exam and this was held until discharge. She was instructed to go to SNF on foresemide 10 mg po daily which will be reassessed by PCP. This can be reassessed by PCP as outpatient. Creatinine should be rechecked at [facility] tomorrow.  # Chronic Respiratory Failure: [name] had some SOB on 7/26. An EKG was performed and had no new ST changes and negative troponin x 3. A repeat CXR was notable for milt interstitial pulmonary edema. She was continued on her albuterol 2.5 mg QID and budesonide 0.25 mg BID.   # Dyslipidemia: Her last lipid panel was cholesterol 96, triglycerides 66, HDL 49, LDL 34 on 07/02/2013. Patient  Was continued on home atorvastatin 80 mg po daily.  # Hyperkalemia: Potassium was mildly elevated the morning of discharge at 5.3. The etiology is unclear as kidney function was improved, blood sugars are under better control, and the HR is 100-120s generally. This should be rechecked tomorrow at [facility].  Discharge Vitals:   BP 116/79  Pulse 115  Temp(Src) 97.4 F (36.3 C) (Oral)  Resp 18  Ht 5\' 3"  (1.6 m)  Wt 270 lb 8 oz (122.698 kg)  BMI 47.93 kg/m2  SpO2 98%  Discharge Physical Exam: General Appearance: Obese, alert sitting up in chair, NAD  HEENT: Normocephalic, dry MM, PERRL  Neck: No carotid bruit or JVD  Back: Symmetric, no curvature  Lungs: Clear to auscultation bilaterally but  very difficult to assess from habitus and very poor effort by patient  Heart: Tachycardic, irregularly irregular rate and rhythm, S1 and S2 normal, no murmur, rub or gallop, tenderness to palpation over sternum and L chest  Abdomen: Soft, non-tender, bowel sounds active all four quadrants, no masses, no organomegaly  Extremities: Extremities normal, atraumatic, no cyanosis. 0-1+ b/l pitting edema in LE unchanged  Pulses: 2+ and symmetric radial and DP pulses    Discharge Labs:   Basic Metabolic Panel:  Recent Labs Lab 12/31/13 0822 12/31/13 0955  NA 139 139  K 5.8* 5.3  CL 105 106  CO2 18* 19  GLUCOSE 210* 223*  BUN 50* 50*  CREATININE 2.32* 2.44*  CALCIUM 7.9* 7.9*   Liver Function Tests:  Recent Labs Lab 12/26/13 1254  AST 23  ALT 36*  ALKPHOS 104  BILITOT 0.5  PROT 7.6  ALBUMIN 3.3*   No results found for this basename: LIPASE, AMYLASE,  in the last 168 hours No results found for this basename: AMMONIA,  in the last 168 hours CBC:  Recent Labs Lab 12/26/13 1254  12/30/13 0420 12/31/13 0955  WBC 9.5  < > 6.4 7.0  NEUTROABS 6.9  --   --   --   HGB 11.2*  < > 10.0* 11.0*  HCT 37.1  < > 33.7* 36.7  MCV 93.7  < > 93.1 95.3  PLT 116*  < > 107* 89*  < > = values in this interval not displayed. Cardiac Enzymes:  Recent Labs Lab 12/30/13 0711 12/30/13 1457 12/30/13 1925  TROPONINI <0.30 <0.30 <0.30   BNP: No results  found for this basename: PROBNP,  in the last 168 hours D-Dimer: No results found for this basename: DDIMER,  in the last 168 hours CBG:  Recent Labs Lab 12/30/13 0613 12/30/13 1104 12/30/13 1627 12/30/13 2155 12/31/13 0624 12/31/13 1129  GLUCAP 171* 215* 186* 140* 180* 182*   Hemoglobin A1C:  Recent Labs Lab 12/26/13 1614  HGBA1C 9.8*   Fasting Lipid Panel: No results found for this basename: CHOL, HDL, LDLCALC, TRIG, CHOLHDL, LDLDIRECT,  in the last 168 hours Thyroid Function Tests: No results found for this basename: TSH,  T4TOTAL, FREET4, T3FREE, THYROIDAB,  in the last 168 hours Coagulation:  Recent Labs Lab 12/28/13 0725 12/29/13 0839 12/30/13 0420 12/31/13 0822  LABPROT 27.6* 31.4* 34.7* 31.0*  INR 2.57* 3.03* 3.45* 2.98*   Anemia Panel: No results found for this basename: VITAMINB12, FOLATE, FERRITIN, TIBC, IRON, RETICCTPCT,  in the last 168 hours Urine Drug Screen: Drugs of Abuse  No results found for this basename: labopia,  cocainscrnur,  labbenz,  amphetmu,  thcu,  labbarb    Alcohol Level: No results found for this basename: ETH,  in the last 168 hours Urinalysis:  Recent Labs Lab 12/26/13 1235  COLORURINE YELLOW  LABSPEC 1.023  PHURINE 5.5  GLUCOSEU >1000*  HGBUR SMALL*  BILIRUBINUR NEGATIVE  KETONESUR NEGATIVE  PROTEINUR 100*  UROBILINOGEN 0.2  NITRITE NEGATIVE  LEUKOCYTESUR SMALL*  microscopy granular casts  Misc. Labs: Lactic acid 2.2 Salicylate level <2.0 Qualitative ketone negative Osmolality 336    Results for orders placed during the hospital encounter of 12/26/13 (from the past 24 hour(s))  TROPONIN I     Status: None   Collection Time    12/30/13  2:57 PM      Result Value Ref Range   Troponin I <0.30  <0.30 ng/mL  GLUCOSE, CAPILLARY     Status: Abnormal   Collection Time    12/30/13  4:27 PM      Result Value Ref Range   Glucose-Capillary 186 (*) 70 - 99 mg/dL   Comment 1 Notify RN    TROPONIN I     Status: None   Collection Time    12/30/13  7:25 PM      Result Value Ref Range   Troponin I <0.30  <0.30 ng/mL  GLUCOSE, CAPILLARY     Status: Abnormal   Collection Time    12/30/13  9:55 PM      Result Value Ref Range   Glucose-Capillary 140 (*) 70 - 99 mg/dL   Comment 1 Notify RN    GLUCOSE, CAPILLARY     Status: Abnormal   Collection Time    12/31/13  6:24 AM      Result Value Ref Range   Glucose-Capillary 180 (*) 70 - 99 mg/dL  PROTIME-INR     Status: Abnormal   Collection Time    12/31/13  8:22 AM      Result Value Ref Range    Prothrombin Time 31.0 (*) 11.6 - 15.2 seconds   INR 2.98 (*) 0.00 - 2.54  BASIC METABOLIC PANEL     Status: Abnormal   Collection Time    12/31/13  8:22 AM      Result Value Ref Range   Sodium 139  137 - 147 mEq/L   Potassium 5.8 (*) 3.7 - 5.3 mEq/L   Chloride 105  96 - 112 mEq/L   CO2 18 (*) 19 - 32 mEq/L   Glucose, Bld 210 (*) 70 - 99 mg/dL  BUN 50 (*) 6 - 23 mg/dL   Creatinine, Ser 2.32 (*) 0.50 - 1.10 mg/dL   Calcium 7.9 (*) 8.4 - 10.5 mg/dL   GFR calc non Af Amer 20 (*) >90 mL/min   GFR calc Af Amer 23 (*) >90 mL/min   Anion gap 16 (*) 5 - 15  CBC     Status: Abnormal   Collection Time    12/31/13  9:55 AM      Result Value Ref Range   WBC 7.0  4.0 - 10.5 K/uL   RBC 3.85 (*) 3.87 - 5.11 MIL/uL   Hemoglobin 11.0 (*) 12.0 - 15.0 g/dL   HCT 36.7  36.0 - 46.0 %   MCV 95.3  78.0 - 100.0 fL   MCH 28.6  26.0 - 34.0 pg   MCHC 30.0  30.0 - 36.0 g/dL   RDW 19.3 (*) 11.5 - 15.5 %   Platelets 89 (*) 150 - 400 K/uL  BASIC METABOLIC PANEL     Status: Abnormal   Collection Time    12/31/13  9:55 AM      Result Value Ref Range   Sodium 139  137 - 147 mEq/L   Potassium 5.3  3.7 - 5.3 mEq/L   Chloride 106  96 - 112 mEq/L   CO2 19  19 - 32 mEq/L   Glucose, Bld 223 (*) 70 - 99 mg/dL   BUN 50 (*) 6 - 23 mg/dL   Creatinine, Ser 2.44 (*) 0.50 - 1.10 mg/dL   Calcium 7.9 (*) 8.4 - 10.5 mg/dL   GFR calc non Af Amer 19 (*) >90 mL/min   GFR calc Af Amer 22 (*) >90 mL/min   Anion gap 14  5 - 15  GLUCOSE, CAPILLARY     Status: Abnormal   Collection Time    12/31/13 11:29 AM      Result Value Ref Range   Glucose-Capillary 182 (*) 70 - 99 mg/dL   Results for orders placed during the hospital encounter of 12/26/13  MRSA PCR SCREENING     Status: Abnormal   Collection Time    12/26/13  4:20 PM      Result Value Ref Range Status   MRSA by PCR POSITIVE (*) NEGATIVE Final   Comment:            The GeneXpert MRSA Assay (FDA     approved for NASAL specimens     only), is one component of a      comprehensive MRSA colonization     surveillance program. It is not     intended to diagnose MRSA     infection nor to guide or     monitor treatment for     MRSA infections.     RESULT CALLED TO, READ BACK BY AND VERIFIED WITH:     Dulcy Fanny RN 1610 12/26/13 A BROWNING  URINE CULTURE     Status: None   Collection Time    12/27/13  2:37 PM      Result Value Ref Range Status   Specimen Description URINE, CLEAN CATCH   Final   Special Requests NONE   Final   Culture  Setup Time     Final   Value: 12/27/2013 21:12     Performed at [facility]     Final   Value: 95,000 COLONIES/ML     Performed at [facility]     Final  Value: Multiple bacterial morphotypes present, none predominant. Suggest appropriate recollection if clinically indicated.     Performed at [facility]   Report Status 12/28/2013 FINAL   Final   Signed: Kelby Aline, MD 12/31/2013, 1:17 PM   Services Ordered on Discharge: home health PT/OT Equipment Ordered on Discharge: none

## 2013-12-29 NOTE — Progress Notes (Signed)
  PROGRESS NOTE MEDICINE TEACHING ATTENDING   Day 3 of stay Patient name: [name]   Medical record number: [id number] Date of birth: Mar 25, 1941    I met and evaluated the patient today. She has no new complaints. On exam, she is morbidly obese. No acute distress. Irregular irregular cardiac rhythm. No murmurs.   Her blood sugar is better controlled. Blood pressure is stable - agree with transfer to tele. Coreg was increased yesterday, her heart rate appears to be better controlled. If she keeps a stable controlled heart rate with no subsequent complications, she can be discharged tomorrow. Pt will work with her in the meantime, and assess the need for continued PT services. I see that Dr Ethelene Hal held torsemide today - I would resume torsemide from tomorrow since the patient has positive fluid balance. I agree with discontinuing antibiotics because the patient has no symptoms and urine culture is non-specific.    I have discussed the care of this patient with my IM team residents. Please see the resident note for details.  [place], [place] 12/29/2013, 2:19 PM.

## 2013-12-29 NOTE — Progress Notes (Signed)
NURSE CALLED FROM 2 WEST. GAVE REPORT. TAKING PT TO FLOOR SHORTLY.

## 2013-12-29 NOTE — Care Management Note (Addendum)
    Page 1 of 2   12/31/2013     4:35:12 PM CARE MANAGEMENT NOTE 12/31/2013  Patient:  [name], [name]   Account Number:  [id number]  Date Initiated:  12/29/2013  Documentation initiated by:  MAYO,HENRIETTA  Subjective/Objective Assessment:   dx hyperosmolar hyperglycemic state; lives with son    PCP  Dione Housekeeper     Action/Plan:   Anticipated DC Date:  12/31/2013   Anticipated DC Plan:  SKILLED NURSING FACILITY  In-house referral  Clinical Social Worker      DC Planning Services  CM consult      Choice offered to / List presented to:  C-1 Patient        [place] arranged  [place] RN      [place].   Status of service:  Completed, signed off Medicare Important Message given?  YES (If response is "NO", the following Medicare IM given date fields will be blank) Date Medicare IM given:  12/29/2013 Medicare IM given by:  MAYO,HENRIETTA Date Additional Medicare IM given:   Additional Medicare IM given by:    Discharge Disposition:  [place]  Per UR Regulation:  Reviewed for med. necessity/level of care/duration of stay  If discussed at [place] of Stay Meetings, dates discussed:    Comments:  12/31/13 Ellan Lambert, RN, BSN [phone] PT recommending SNF for short term rehab; Pt dc to SNF today, per CSW arrangements.  [facility] aware of change in dc plan.  12/29/13 [place] MSN BSN CCM Pt to d/c with home health PT/OT/aide.  Provided list of agencies, referral made to [facility] per pt request. [phone] Notified by [facility] liaison that pt is already active with them for RN, PT, OT, aide.

## 2013-12-29 NOTE — Progress Notes (Signed)
ATTEMPTED TO GIVE REPORT TO FLOOR 2 WEST AND THEY STATED THEY WOULD CALL NURSE BACK. UNABLE TO TRANSFER PT TO FLOOR UNTIL NURSE CAN TAKE REPORT.

## 2013-12-29 NOTE — Progress Notes (Signed)
ANTICOAGULATION CONSULT NOTE - Follow Up Consult  Pharmacy Consult for coumadin Indication: atrial fibrillation  Allergies  Allergen Reactions  . Codeine Other (See Comments)    Childhood allergy    Patient Measurements: Height: 5\' 3"  (160 cm) Weight:  (bed scale error) IBW/kg (Calculated) : 52.4  Vital Signs: Temp: 97.7 F (36.5 C) (07/27 0754) Temp src: Oral (07/27 0754) BP: 124/91 mmHg (07/27 0800) Pulse Rate: 94 (07/27 0800)  Labs:  Recent Labs  12/26/13 1254  12/26/13 1614  12/26/13 2202 12/27/13 0037 12/27/13 0236 12/27/13 0421 12/28/13 0725 12/28/13 1305 12/29/13 0839  HGB 11.2*  --  10.5*  --   --   --   --   --  10.6*  --   --   HCT 37.1  --  34.9*  --   --   --   --   --  35.2*  --   --   PLT 116*  --  118*  --   --   --   --   --  111*  --   --   LABPROT  --   < >  --   --   --   --  25.4*  --  27.6*  --  31.4*  INR  --   < >  --   --   --   --  2.31*  --  2.57*  --  3.03*  CREATININE 2.22*  --   --   < > 2.27* 2.23* 2.31*  --  2.62*  --   --   TROPONINI  --   --   --   < > <0.30  --   --  <0.30  --  <0.30  --   < > = values in this interval not displayed.  Estimated Creatinine Clearance: 29.1 ml/min (by C-G formula based on Cr of 2.62).   Medications:  Scheduled:  . antiseptic oral rinse  15 mL Mouth Rinse BID  . aspirin EC  81 mg Oral Daily  . atorvastatin  80 mg Oral Daily  . budesonide  0.25 mg Nebulization BID  . carvedilol  12.5 mg Oral BID WC  . Chlorhexidine Gluconate Cloth  6 each Topical Q0600  . furosemide  80 mg Intravenous Once  . insulin aspart  0-9 Units Subcutaneous TID WC  . insulin aspart  10 Units Intravenous Once  . insulin glargine  20 Units Subcutaneous QHS  . mupirocin ointment  1 application Nasal BID  . warfarin  7.5 mg Oral q1800  . Warfarin - Pharmacist Dosing Inpatient   Does not apply q1800    Assessment: 73 yo female admitted with hyperosmolar non-ketotic state. Pt on coumadin for afib. INR therapeutic on  admission now slightly supratherapeutic 3.03, trending up (likely due to abx which have now been d/c'd). H/h low but stable 10.6/35.2. Plts low, trending down to 111 (hx of TCP). No bleeding documented.  Home coumadin dose: 7.5mg /day  Goal of Therapy:  INR 2-3 Monitor platelets by anticoagulation protocol: Yes   Plan:  -Continue Coumadin 7.5mg /day (INR increase likely due to abx which have now been d/c'd) -Daily PT/INR -Monitor signs of bleeding   Elicia Lamp, PharmD Clinical Pharmacist - Resident Pager [phone] 7/27/201511:33 AM

## 2013-12-30 ENCOUNTER — Inpatient Hospital Stay (HOSPITAL_COMMUNITY): Payer: Medicare Other

## 2013-12-30 LAB — PROTIME-INR
INR: 3.45 — ABNORMAL HIGH (ref 0.00–1.49)
Prothrombin Time: 34.7 seconds — ABNORMAL HIGH (ref 11.6–15.2)

## 2013-12-30 LAB — BASIC METABOLIC PANEL
Anion gap: 12 (ref 5–15)
BUN: 59 mg/dL — ABNORMAL HIGH (ref 6–23)
CALCIUM: 7.9 mg/dL — AB (ref 8.4–10.5)
CO2: 21 mEq/L (ref 19–32)
Chloride: 107 mEq/L (ref 96–112)
Creatinine, Ser: 2.75 mg/dL — ABNORMAL HIGH (ref 0.50–1.10)
GFR calc Af Amer: 19 mL/min — ABNORMAL LOW (ref >90)
GFR calc non Af Amer: 16 mL/min — ABNORMAL LOW (ref >90)
GLUCOSE: 207 mg/dL — AB (ref 70–99)
Potassium: 4.8 mEq/L (ref 3.7–5.3)
SODIUM: 140 meq/L (ref 137–147)

## 2013-12-30 LAB — TROPONIN I
Troponin I: <0.3 ng/mL (ref <0.30)
Troponin I: <0.3 ng/mL (ref <0.30)
Troponin I: <0.3 ng/mL (ref <0.30)

## 2013-12-30 LAB — GLUCOSE, CAPILLARY
GLUCOSE-CAPILLARY: 215 mg/dL — AB (ref 70–99)
GLUCOSE-CAPILLARY: 186 mg/dL — AB (ref 70–99)
Glucose-Capillary: 171 mg/dL — ABNORMAL HIGH (ref 70–99)
Glucose-Capillary: 140 mg/dL — ABNORMAL HIGH (ref 70–99)

## 2013-12-30 LAB — CBC
HCT: 33.7 % — ABNORMAL LOW (ref 36.0–46.0)
HEMOGLOBIN: 10 g/dL — AB (ref 12.0–15.0)
MCH: 27.6 pg (ref 26.0–34.0)
MCHC: 29.7 g/dL — ABNORMAL LOW (ref 30.0–36.0)
MCV: 93.1 fL (ref 78.0–100.0)
Platelets: 107 10*3/uL — ABNORMAL LOW (ref 150–400)
RBC: 3.62 MIL/uL — AB (ref 3.87–5.11)
RDW: 19.2 % — ABNORMAL HIGH (ref 11.5–15.5)
WBC: 6.4 10*3/uL (ref 4.0–10.5)

## 2013-12-30 MED ORDER — SODIUM CHLORIDE 0.9 % IV BOLUS (SEPSIS): 500.0000 mL | Freq: Once | INTRAVENOUS | Status: DC

## 2013-12-30 MED ORDER — TORSEMIDE 20 MG PO TABS: 20.0000 mg | ORAL_TABLET | Freq: Every day | ORAL | Status: DC

## 2013-12-30 MED ORDER — DICLOFENAC SODIUM 1 % TD GEL
2.0000 g | Freq: Four times a day (QID) | TRANSDERMAL | Status: DC | PRN
  Filled 2013-12-30: qty 100

## 2013-12-30 MED ORDER — CARVEDILOL 12.5 MG PO TABS: 12.5000 mg | ORAL_TABLET | Freq: Two times a day (BID) | ORAL | Status: DC

## 2013-12-30 MED ORDER — PREGABALIN 75 MG PO CAPS
75.0000 mg | ORAL_CAPSULE | Freq: Once | ORAL | Status: AC
  Administered 2013-12-30: 75 mg via ORAL
  Filled 2013-12-30: qty 1

## 2013-12-30 NOTE — Progress Notes (Addendum)
Subjective: Patient was seen by night team at ~6:30 am for chest pain. I then saw her at 7 am. She reports intermittent sharp left sided chest pain that is worse with deep inspiration. She also had some pain to sternal palpation. Her HR was 90s-100s. She otherwise denies SOB, palpitations, diaphoresis, nausea, emesis, fevers, or chills.  Objective: Vital signs in last 24 hours: Filed Vitals:   12/30/13 0350 12/30/13 0448 12/30/13 0504 12/30/13 0610  BP: 121/76 126/75  123/74  Pulse: 96 102  104  Temp: 97.3 F (36.3 C) 97.6 F (36.4 C)  97.6 F (36.4 C)  TempSrc: Oral Oral  Oral  Resp: 18 20  20   Height:      Weight:   334 lb (151.501 kg)   SpO2: 100% 96%  98%   Weight change:   Intake/Output Summary (Last 24 hours) at 12/30/13 0804 Last data filed at 12/30/13 0350  Gross per 24 hour  Intake    400 ml  Output   1400 ml  Net  -1000 ml   BP 123/74  Pulse 104  Temp(Src) 97.6 F (36.4 C) (Oral)  Resp 20  Ht 5\' 3"  (1.6 m)  Wt 334 lb (151.501 kg)  BMI 59.18 kg/m2  SpO2 98%  General Appearance:    Obese, alert laying bed, appears uncomfortable  HEENT:    Normocephalic, dry MM, PERRL  Neck:   No carotid bruit or JVD  Back:     Symmetric, no curvature  Lungs:     Clear to auscultation bilaterally but very difficult to assess from habitus and very poor effort by patient    Heart:    Tachycardic, irregularly irregular rate and rhythm, S1 and S2 normal, no murmur, rub  or gallop, tenderness to palpation over sternum and L chest  Abdomen:     Soft, non-tender, bowel sounds active all four quadrants,    no masses, no organomegaly  Extremities:   Extremities normal, atraumatic, no cyanosis. 0-1+ b/l pitting edema in LE unchanged  Pulses:   2+ and symmetric radial and DP pulses   Lab Results: Basic Metabolic Panel:  Recent Labs Lab 12/28/13 0725 12/30/13 0420  NA 144 140  K 4.7 4.8  CL 110 107  CO2 20 21  GLUCOSE 176* 207*  BUN 59* 59*  CREATININE 2.62* 2.75*    CALCIUM 8.2* 7.9*   Liver Function Tests:  Recent Labs Lab 12/26/13 1254  AST 23  ALT 36*  ALKPHOS 104  BILITOT 0.5  PROT 7.6  ALBUMIN 3.3*   CBC:  Recent Labs Lab 12/26/13 1254  12/28/13 0725 12/30/13 0420  WBC 9.5  < > 7.2 6.4  NEUTROABS 6.9  --   --   --   HGB 11.2*  < > 10.6* 10.0*  HCT 37.1  < > 35.2* 33.7*  MCV 93.7  < > 94.9 93.1  PLT 116*  < > 111* 107*  < > = values in this interval not displayed. Cardiac Enzymes:  Recent Labs Lab 12/26/13 2202 12/27/13 0421 12/28/13 1305  TROPONINI <0.30 <0.30 <0.30   CBG:  Recent Labs Lab 12/28/13 2137 12/29/13 0755 12/29/13 1212 12/29/13 1632 12/29/13 2300 12/30/13 0613  GLUCAP 234* 207* 231* 227* 209* 171*   Hemoglobin A1C:  Recent Labs Lab 12/26/13 1614  HGBA1C 9.8*   Coagulation:  Recent Labs Lab 12/27/13 0236 12/28/13 0725 12/29/13 0839 12/30/13 0420  LABPROT 25.4* 27.6* 31.4* 34.7*  INR 2.31* 2.57* 3.03* 3.45*   Urinalysis:  Recent Labs Lab 12/26/13 1235  COLORURINE YELLOW  LABSPEC 1.023  PHURINE 5.5  GLUCOSEU >1000*  HGBUR SMALL*  BILIRUBINUR NEGATIVE  KETONESUR NEGATIVE  PROTEINUR 100*  UROBILINOGEN 0.2  NITRITE NEGATIVE  LEUKOCYTESUR SMALL*   Misc. Labs: Qualitative ketones negative Lactic acid 2.2 Salicylate level <5.9  Micro Results: Recent Results (from the past 240 hour(s))  MRSA PCR SCREENING     Status: Abnormal   Collection Time    12/26/13  4:20 PM      Result Value Ref Range Status   MRSA by PCR POSITIVE (*) NEGATIVE Final   Comment:            The GeneXpert MRSA Assay (FDA     approved for NASAL specimens     only), is one component of a     comprehensive MRSA colonization     surveillance program. It is not     intended to diagnose MRSA     infection nor to guide or     monitor treatment for     MRSA infections.     RESULT CALLED TO, READ BACK BY AND VERIFIED WITH:     Dulcy Fanny RN 2148 12/26/13 A BROWNING  URINE CULTURE     Status: None    Collection Time    12/27/13  2:37 PM      Result Value Ref Range Status   Specimen Description URINE, CLEAN CATCH   Final   Special Requests NONE   Final   Culture  Setup Time     Final   Value: 12/27/2013 21:12     Performed at [place]     Final   Value: 95,000 COLONIES/ML     Performed at [facility]   Culture     Final   Value: Multiple bacterial morphotypes present, none predominant. Suggest appropriate recollection if clinically indicated.     Performed at [facility]   Report Status 12/28/2013 FINAL   Final   Studies/Results: Dg Chest Port 1 View  12/28/2013   CLINICAL DATA:  73 year old female with shortness of breath and congestion.  EXAM: PORTABLE CHEST - 1 VIEW  COMPARISON:  12/27/2013 and prior chest radiographs  FINDINGS: Cardiomegaly, pulmonary vascular congestion and mild interstitial pulmonary edema noted.  There may be a small right pleural effusion present.  There is no evidence of pneumothorax or acute bony abnormality.  IMPRESSION: Cardiomegaly with pulmonary vascular congestion and mild interstitial pulmonary edema.  Question small right pleural effusion.   Electronically Signed   By: Hassan Rowan M.D.   On: 12/28/2013 13:28   EKG 7/28 06:12 am unchanged from 7/26, atrial fibrillation, RBBB, PVCs  Medications: I have reviewed the patient's current medications. Scheduled Meds: . antiseptic oral rinse  15 mL Mouth Rinse BID  . aspirin EC  81 mg Oral Daily  . atorvastatin  80 mg Oral Daily  . budesonide  0.25 mg Nebulization BID  . carvedilol  12.5 mg Oral BID WC  . Chlorhexidine Gluconate Cloth  6 each Topical Q0600  . furosemide  80 mg Intravenous Once  . insulin aspart  0-9 Units Subcutaneous TID WC  . insulin aspart  10 Units Intravenous Once  . insulin glargine  20 Units Subcutaneous QHS  . mupirocin ointment  1 application Nasal BID  . warfarin  7.5 mg Oral q1800  . Warfarin - Pharmacist Dosing Inpatient   Does not  apply q1800   Continuous Infusions:  PRN Meds:.acetaminophen, dextrose, diclofenac sodium Assessment/Plan: # Atrial Fibrillation w RVR: Likely exacerbated by volume depletion in setting of HHS that has now resolved. 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 Pt anticoagulated with warfarin INR 2.6 yesterday. Last echo 09/26/2013 with EF 60-65%, no wall motion abnormalities, R ventricular systolic pressure increased. Orthostatics were negative. HR is lower today in the 100s-110s after increase in her beta blocker. INR 3.45, managed by pharmacy. - Warfarin per pharmacy  -carvedilol 12.5 mg po BID  -cardiac monitoring -transfer to tele -hold torsemide for now as she appeared dry on exam, resume on discharge -consider additional IV hydration  #Chest pain: Patient has pleuritic chest pain that is reproduced by palpation. This appears to be musculoskeletal. Repeat EKG unchanged from 7/26 -cycle troponin-i x 3 -CXR -diclofenac transdermal gel 2 g QIDPRN -cardiac monitoring  #Hyperosmolar Hyperglycemic State-resolved: Patient admitted with BG>600, anion gap of 19 and potassium of 6.6 likely in the setting of DM2 medical noncompliance. Infectious workup revealed UA small LE UCx multiple bacterial morphotypes and CXR with possible PNA vs fluid. Very poor quality CXR images with portable and underpenetrated 2view. CBG 170s-230s this morning and last night  -carb modified diet -lantus 20u qhs, SSI-sensitive -CBG TIDAC + QHS  -UCx multiple bacterial morphotypes, none predominant 95,000 colonies, so d/ced keflex  # CAD: Patient intermittently endorsed some left sided chest pain on initial interview. Troponin negative x 3EKG unchanged from prior. See chest pain above. -cont ASA 81 mg po daily, carvedilol increased as per above  #h/o CVA: In 08/2013 she had difficulty w peripheral vision on right and slurred speech. 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 Carotid u/s had 40-59% proximal R ICA stenosis.  -cont ASA 81 mg po for now   # CKD stage IV: Creatinine 2.75. Was 2.6-2.9 1 month ago. GFR now 16.  -hold torsemide 40 mg po daily for now while volume down, resume on d/c -cont to monitor   # Chronic Respiratory Failure: No respiratory complains this am. Patient on albuterol 2.5 mg QID, budesonide 0.25 mg BID at home.  -cont albuterol and pulmicort   # Dyslipidemia: Last lipid panel was cholesterol 96, triglycerides 66, HDL 49, LDL 34 on 07/02/2013. Patient currently on atorvastatin 80 mg po daily at home  -cont atorvastatin  Dispo: Disposition is deferred at this time, awaiting improvement of current medical problems.  Anticipated discharge in approximately 2 day(s).   The patient does have a current PCP Dione Housekeeper, MD) and does need an [facility] hospital follow-up appointment after discharge.  The patient does not know have transportation limitations that hinder transportation to clinic appointments.  .Services Needed at time of discharge: Y = Yes, Blank = No PT:   OT:   RN:   Equipment:   Other:     LOS: 4 days   Brenda Aline, MD 12/30/2013, 8:04 AM

## 2013-12-30 NOTE — Progress Notes (Signed)
  PROGRESS NOTE MEDICINE TEACHING ATTENDING   Day 4 of stay Patient name: [name]   Medical record number: [id number] Date of birth: 11-14-1940   I met and evaluated the patient today. She appeared ill at ease, complained of shortness of breath, and uneasy feeling. Chest pain resolved. No chest tenderness at this time. I have reviewed Dr Bebe Shaggy note and I agree with his exam findings, assessment and plan. For patient's acute on chronic renal failure - we will hold torsemide as she looks dry - weight is down from 350 to 334 lb. We will give her small bolus (recent echo reviewed 60-65% EF no wall motion abnormality). We will check her creatinine again tomorrow.   I have discussed the care of this patient with my IM team residents. Please see the resident note for details.  [place], [place] 12/30/2013, 11:32 AM.

## 2013-12-30 NOTE — Progress Notes (Signed)
Clinical Social Work Department CLINICAL SOCIAL WORK PLACEMENT NOTE 12/30/2013  Patient:  [name], [name]  Account Number:  [id number] Admit date:  12/26/2013  Clinical Social Worker:  Megan Salon  Date/time:  12/30/2013 04:14 PM  Clinical Social Work is seeking post-discharge placement for this patient at the following level of care:   [place]   (*CSW will update this form in Epic as items are completed)   12/30/2013  Patient/family provided with [place] Department of Clinical Social Work's list of facilities offering this level of care within the geographic area requested by the patient (or if unable, by the patient's family).  12/30/2013  Patient/family informed of their freedom to choose among providers that offer the needed level of care, that participate in Medicare, Medicaid or managed care program needed by the patient, have an available bed and are willing to accept the patient.  12/30/2013  Patient/family informed of [facility]' ownership interest in [facility], as well as of the fact that they are under no obligation to receive care at this facility.  PASARR submitted to EDS on 12/30/2013 PASARR number received on 12/30/2013  FL2 transmitted to all facilities in geographic area requested by pt/family on  12/30/2013 FL2 transmitted to all facilities within larger geographic area on   Patient informed that his/her managed care company has contracts with or will negotiate with  certain facilities, including the following:     Patient/family informed of bed offers received:   Patient chooses bed at  Physician recommends and patient chooses bed at    Patient to be transferred to  on   Patient to be transferred to facility by  Patient and family notified of transfer on  Name of family member notified:    The following physician request were entered in Epic:   Additional Comments:  Jeanette Caprice, MSW, [place]

## 2013-12-30 NOTE — Clinical Documentation Improvement (Signed)
Presents with DM2 hyperosmolar non ketosis with CKD4.   Patient's Creatinine has increased from 2.27 on admit to 2.75  That is a increase of 0.48  Please further clarify the type of Renal Failure you are treating:   Acute Renal Failure/Acute Kidney Injury Acute on Chronic Renal Failure Chronic Renal Failure Other Condition   Please document findings in next Progress Note and carry to Discharge Summary if applicable.  Thank You, Zoila Shutter ,RN Clinical Documentation Specialist:  [place] Information Management

## 2013-12-30 NOTE — Progress Notes (Signed)
Inpatient Diabetes Program Recommendations  AACE/ADA: New Consensus Statement on Inpatient Glycemic Control (2013)  Target Ranges:  Prepandial:   less than 140 mg/dL      Peak postprandial:   less than 180 mg/dL (1-2 hours)      Critically ill patients:  140 - 180 mg/dL  Results for [name], [name] (MRN [id number]) as of 12/30/2013 12:54  Ref. Range 12/29/2013 12:12 12/29/2013 16:32 12/29/2013 23:00 12/30/2013 06:13 12/30/2013 11:04  Glucose-Capillary Latest Range: 70-99 mg/dL 231 (H) 227 (H) 209 (H) 171 (H) 215 (H)   Inpatient Diabetes Program Recommendations Insulin - Meal Coverage: consider adding Novolog 4 units TID with meals for elevated postprandials Thank you  Raoul Pitch BSN, RN,CDE Inpatient Diabetes Coordinator [phone] (team pager)

## 2013-12-30 NOTE — Progress Notes (Addendum)
ANTICOAGULATION CONSULT NOTE - Follow Up Consult  Pharmacy Consult for coumadin Indication: atrial fibrillation  Allergies  Allergen Reactions  . Codeine Other (See Comments)    Childhood allergy   Patient Measurements: Height: 5\' 3"  (160 cm) Weight: 334 lb (151.501 kg) IBW/kg (Calculated) : 52.4  Vital Signs: Temp: 97.6 F (36.4 C) (07/28 0610) Temp src: Oral (07/28 0610) BP: 123/74 mmHg (07/28 0610) Pulse Rate: 104 (07/28 0610)  Labs:  Recent Labs  12/28/13 0725 12/28/13 1305 12/29/13 0839 12/30/13 0420 12/30/13 0711  HGB 10.6*  --   --  10.0*  --   HCT 35.2*  --   --  33.7*  --   PLT 111*  --   --  107*  --   LABPROT 27.6*  --  31.4* 34.7*  --   INR 2.57*  --  3.03* 3.45*  --   CREATININE 2.62*  --   --  2.75*  --   TROPONINI  --  <0.30  --   --  <0.30    Estimated Creatinine Clearance: 26.9 ml/min (by C-G formula based on Cr of 2.75).  Assessment: 73 yo female admitted with hyperosmolar non-ketotic state. Pt on warfarin PTA for afib- home dose 7.5mg  daily. INR therapeutic on admission- yesterday INR was 3.03, likely increase from antibiotics which had been discontinued, therefore a warfarin dose was given yesterday. INR this morning 3.45- likely still reflective of antibiotics. Hgb low but stable at 10, Plts low, trending down to 108. No bleeding documented.  Goal of Therapy:  INR 2-3 Monitor platelets by anticoagulation protocol: Yes   Plan:  1. Hold warfarin tonight with elevated INR 2. Daily PT/INR 3. Monitor signs of bleeding   Dorris Pierre D. Bjorn Hallas, PharmD, BCPS Clinical Pharmacist Pager: [phone] 12/30/2013 11:43 AM

## 2013-12-30 NOTE — Progress Notes (Signed)
MD at bedside. Will continue to monitor.   Domingo Dimes RN

## 2013-12-30 NOTE — Evaluation (Signed)
Occupational Therapy Evaluation Patient Details Name: [name] MRN: [id number] DOB: February 22, 1941 Today's Date: 12/30/2013    History of Present Illness Pt is a 73 y/o female admitted with hyperglycemia. Her blood sugar when she arrived here was 598 on CBG and 663 on BMP. Pt has been home 2 weeks from STR at the SNF level prior to returning this admission.   Clinical Impression   Pt admitted with above. She demonstrates the below listed deficits and will benefit from continued OT to maximize safety and independence with BADLs.  Pt presents to OT with significant deconditioning and generalized weakness.  She lives with her sons who work during the day - she was performing BADLs modified independently PTA.  Currently, she requires max A - total A with LB ADLs, and +2 mod A for stand pivot transfers and bed mobility.  Discussed SNF level rehab with pt and she is agreeable, as she does not have necessary level of assist at home.  Pt with orthostatic hypotension BP supine 135/70; sitting 123/61; standing 104/51 - pt symptomatic with complaint of dizziness.       Follow Up Recommendations  SNF;Supervision/Assistance - 24 hour    Equipment Recommendations  None recommended by OT    Recommendations for Other Services       Precautions / Restrictions Precautions Precautions: Fall Precaution Comments: Reports has had one fall since returning home from SNF 2 weeks ago, Restrictions Weight Bearing Restrictions: No      Mobility Bed Mobility Overal bed mobility: Needs Assistance Bed Mobility: Supine to Sit     Supine to sit: Mod assist;+2 for physical assistance     General bed mobility comments: Pt leans heavily posteriorly and to the Rt.   Transfers Overall transfer level: Needs assistance Equipment used: 2 person hand held assist Transfers: Sit to/from [facility] Sit to Stand: Mod assist;+2 physical assistance Stand pivot transfers: Mod assist;+2 physical  assistance       General transfer comment: Pt requires assist to power up.  She became unsteady on her feet requiring +2 assist for transfer    Balance Overall balance assessment: Needs assistance Sitting-balance support: Feet supported Sitting balance-Leahy Scale: Poor Sitting balance - Comments: EOB required mod A Postural control: Posterior lean;Right lateral lean Standing balance support: Bilateral upper extremity supported Standing balance-Leahy Scale: Poor                              ADL Overall ADL's : Needs assistance/impaired Eating/Feeding: Independent;Bed level;Sitting   Grooming: Wash/dry hands;Wash/dry face;Oral care;Brushing hair;Set up;Sitting   Upper Body Bathing: Minimal assitance;Sitting;Bed level   Lower Body Bathing: Maximal assistance;Bed level;Sit to/from stand   Upper Body Dressing : Moderate assistance;Sitting Upper Body Dressing Details (indicate cue type and reason): assist to maintain balance on EOB Lower Body Dressing: Total assistance;Sit to/from stand;Bed level   Toilet Transfer: Moderate assistance;+2 for physical assistance;Stand-pivot;BSC   Toileting- Clothing Manipulation and Hygiene: Total assistance;Sit to/from stand       Functional mobility during ADLs: +2 for physical assistance;Moderate assistance General ADL Comments: Pt moves very slowly.  She self distracts with conversation requiring redirection throughout session.  CNA present throughout eval.  orthostatic BPs taken     Vision                     Perception     Praxis      Pertinent Vitals/Pain Denies pain  Hand Dominance Right   Extremity/Trunk Assessment Upper Extremity Assessment Upper Extremity Assessment: Generalized weakness   Lower Extremity Assessment Lower Extremity Assessment: Defer to PT evaluation   Cervical / Trunk Assessment Cervical / Trunk Assessment: Kyphotic   Communication Communication Communication: No  difficulties   Cognition Arousal/Alertness: Lethargic Behavior During Therapy: Flat affect Overall Cognitive Status: Within Functional Limits for tasks assessed                     General Comments       Exercises       Shoulder Instructions      Home Living Family/patient expects to be discharged to:: Private residence Living Arrangements: Children Available Help at Discharge: Family;Available PRN/intermittently Type of Home: House Home Access: Ramped entrance     Home Layout: One level     Bathroom Shower/Tub: Walk-in shower;Door   [employer] Toilet: Standard Bathroom Accessibility: Yes How Accessible: Accessible via walker Home Equipment: Walker - 2 wheels;Cane - single point;Wheelchair - manual;Shower seat;Bedside commode   Additional Comments: Pt lives with sons however noone will be available to stay with her during the day. Trying to find assist for daytime.       Prior Functioning/Environment Level of Independence: Independent with assistive device(s)        Comments: ~2 days PTA needed assist with ADL's, before that independent with RW. Been home 2 weeks from SNF PTA.     OT Diagnosis: Generalized weakness   OT Problem List: Decreased strength;Decreased activity tolerance;Impaired balance (sitting and/or standing);Decreased safety awareness;Decreased knowledge of use of DME or AE;Obesity;Cardiopulmonary status limiting activity   OT Treatment/Interventions: Self-care/ADL training;DME and/or AE instruction;Therapeutic activities;Patient/family education;Balance training    OT Goals(Current goals can be found in the care plan section) Acute Rehab OT Goals Patient Stated Goal: to get stronger OT Goal Formulation: With patient Time For Goal Achievement: 01/13/14 Potential to Achieve Goals: Good ADL Goals Pt Will Perform Upper Body Bathing: with set-up;sitting Pt Will Perform Lower Body Bathing: with mod assist;with adaptive equipment;sit to/from  stand Pt Will Perform Upper Body Dressing: with set-up;sitting Pt Will Perform Lower Body Dressing: with min assist;with adaptive equipment;sit to/from stand Pt Will Transfer to Toilet: with min assist;ambulating;bedside commode;grab bars Pt Will Perform Toileting - Clothing Manipulation and hygiene: with min assist;sit to/from stand  OT Frequency: Min 2X/week   Barriers to D/C: Decreased caregiver support          Co-evaluation              End of Session Equipment Utilized During Treatment: Oxygen (2) Nurse Communication: Mobility status  Activity Tolerance: Patient tolerated treatment well Patient left: in chair;with call bell/phone within reach   Time: 1316-1349 OT Time Calculation (min): 33 min Charges:  OT General Charges $OT Visit: 1 Procedure OT Evaluation $Initial OT Evaluation Tier I: 1 Procedure OT Treatments $Therapeutic Activity: 23-37 mins G-Codes:    Donnell Beauchamp M Jan 11, 2014, 2:16 PM

## 2013-12-30 NOTE — Progress Notes (Signed)
Called MD on call to address patient leg pain.  She wants her lyrica.  I explained earlier today that MD did not want her to have this medication at this time. I will continue to monitor and offer comfort as needed. Payton Emerald, RN

## 2013-12-30 NOTE — Progress Notes (Signed)
Spoke with patient and her son about diabetes and home regimen for diabetes control. Patient reports that she is followed by her PCP for diabetes management and currently she takes Lantus 40 units QHS and Novolog 5 units TID with meals as an outpatient for diabetes control. Noted on home medication list in the computer it has Lantus 20 units QHS and Novolog 0-9 units AC as outpatient diabetes medications but was not able to revise home medications to change to patient reported doses of Lantus and Novolog.  Inquired about knowledge about A1C and patient reports that she does not know what an A1C is exactly but that she knows her "numbers have been high a few times".  Discussed A1C results (9.8% on 12/26/13) and explained what an A1C is, basic pathophysiology of DM Type 2, basic home care, importance of checking CBGs and maintaining good CBG control to prevent long-term and short-term complications. Discussed impact of nutrition, exercise, stress, sickness, and medications on diabetes control.  Patient states that she checks her blood glucose at least 3 times a day and it has been fluctuating from mid 40's-over 500 mg/dl. Inquired about any recent changes with insulin regimen and patient states that she has been on the Lantus 40 units QHS and Novolog 5 units TID for "quite a while". Inquired about taking insulins as ordered and patient states that she always takes her insulin as the doctor has ordered.  However, he son states he wonders if she forgets sometimes and if that is the cause of higher readings at times. Patient states that she is not followed by an endocrinologist and she would be interested in being referred to a local endocrinologist at time of discharge so she can get help with improving diabetes control. Encouraged patient to continue to monitor glucose at least 3 times a day and take results with her to follow up doctor visits so that her insulin could be adjusted to improve glycemic control. Patient  verbalized understanding of information discussed and she states that she has no further questions at this time related to diabetes.   Thanks, Barnie Alderman, RN, MSN, CCRN Diabetes Coordinator Inpatient Diabetes Program [phone] (Team Pager) [phone] (AP office) [phone] [facility] office)

## 2013-12-30 NOTE — Progress Notes (Signed)
Patient states, "can't breathe and my right leg is itchy." Patient VSS. She stated she has restless leg syndrome and takes medication at home for it. Patient continues to scream out. She continues to complain of SOB and begins to complain of chest pain. VSS. EKG taken. MD made aware. Will continue to monitor. Call bell within reach.   Domingo Dimes RN

## 2013-12-30 NOTE — Progress Notes (Signed)
Clinical Social Work Department BRIEF PSYCHOSOCIAL ASSESSMENT 12/30/2013  Patient:  [name], [name]     Account Number:  [id number]     Admit date:  12/26/2013  Clinical Social Worker:  Megan Salon  Date/Time:  12/30/2013 04:12 PM  Referred by:  Care Management  Date Referred:  12/30/2013 Referred for  SNF Placement   Other Referral:   Interview type:  Patient Other interview type:    PSYCHOSOCIAL DATA Living Status:  FAMILY Admitted from facility:   Level of care:   Primary support name:  Kendell Bane Primary support relationship to patient:  CHILD, ADULT Degree of support available:   Good    CURRENT CONCERNS Current Concerns  Post-Acute Placement   Other Concerns:    SOCIAL WORK ASSESSMENT / PLAN Clinical Social Worker received referral for SNF placement at d/c. Clinical Social Worker met with patient at bedside to offer support and discuss patient needs at discharge.    CSW introduced self and explained reason for visit. CSW explained SNF process to patient. Patient reported she is agreeable for SNF placement and has been to the [facility] before and liked it. CSW encouraged patient to think about additional SNF options pending availability of preferred facility. CSW will complete FL2 for MD's signature and will update patient when bed offers are received.    CSW remains available for support and to facilitate patient discharge needs once medically ready.   Assessment/plan status:  Psychosocial Support/Ongoing Assessment of Needs Other assessment/ plan:   Information/referral to community resources:   SNF information    PATIENT'S/FAMILY'S RESPONSE TO PLAN OF CARE: Patient states she has been to the [facility] before and would like to go back.        Jeanette Caprice, MSW, [place]

## 2013-12-31 LAB — BASIC METABOLIC PANEL
ANION GAP: 16 — AB (ref 5–15)
Anion gap: 14 (ref 5–15)
BUN: 50 mg/dL — AB (ref 6–23)
BUN: 50 mg/dL — AB (ref 6–23)
CALCIUM: 7.9 mg/dL — AB (ref 8.4–10.5)
CO2: 18 mEq/L — ABNORMAL LOW (ref 19–32)
CO2: 19 mEq/L (ref 19–32)
CREATININE: 2.44 mg/dL — AB (ref 0.50–1.10)
Calcium: 7.9 mg/dL — ABNORMAL LOW (ref 8.4–10.5)
Chloride: 105 mEq/L (ref 96–112)
Chloride: 106 mEq/L (ref 96–112)
Creatinine, Ser: 2.32 mg/dL — ABNORMAL HIGH (ref 0.50–1.10)
GFR calc non Af Amer: 20 mL/min — ABNORMAL LOW (ref >90)
GFR calc non Af Amer: 19 mL/min — ABNORMAL LOW (ref >90)
GFR, EST AFRICAN AMERICAN: 23 mL/min — AB (ref >90)
GFR, EST AFRICAN AMERICAN: 22 mL/min — AB (ref >90)
Glucose, Bld: 210 mg/dL — ABNORMAL HIGH (ref 70–99)
Glucose, Bld: 223 mg/dL — ABNORMAL HIGH (ref 70–99)
Potassium: 5.8 mEq/L — ABNORMAL HIGH (ref 3.7–5.3)
Potassium: 5.3 mEq/L (ref 3.7–5.3)
Sodium: 139 mEq/L (ref 137–147)
Sodium: 139 mEq/L (ref 137–147)

## 2013-12-31 LAB — GLUCOSE, CAPILLARY
Glucose-Capillary: 180 mg/dL — ABNORMAL HIGH (ref 70–99)
Glucose-Capillary: 182 mg/dL — ABNORMAL HIGH (ref 70–99)

## 2013-12-31 LAB — CBC
HCT: 36.7 % (ref 36.0–46.0)
HEMOGLOBIN: 11 g/dL — AB (ref 12.0–15.0)
MCH: 28.6 pg (ref 26.0–34.0)
MCHC: 30 g/dL (ref 30.0–36.0)
MCV: 95.3 fL (ref 78.0–100.0)
Platelets: 89 10*3/uL — ABNORMAL LOW (ref 150–400)
RBC: 3.85 MIL/uL — ABNORMAL LOW (ref 3.87–5.11)
RDW: 19.3 % — AB (ref 11.5–15.5)
WBC: 7 10*3/uL (ref 4.0–10.5)

## 2013-12-31 LAB — PROTIME-INR
INR: 2.98 — ABNORMAL HIGH (ref 0.00–1.49)
PROTHROMBIN TIME: 31 s — AB (ref 11.6–15.2)

## 2013-12-31 MED ORDER — WARFARIN SODIUM 5 MG PO TABS
5.0000 mg | ORAL_TABLET | Freq: Once | ORAL | Status: DC
  Filled 2013-12-31: qty 1

## 2013-12-31 MED ORDER — TORSEMIDE 10 MG PO TABS: 10.0000 mg | ORAL_TABLET | Freq: Every day | ORAL | Status: DC

## 2013-12-31 NOTE — Progress Notes (Signed)
  PROGRESS NOTE MEDICINE TEACHING ATTENDING   Day 5 of stay Patient name: [name]   Medical record number: [id number] Date of birth: 27-Jul-1940   I met and evaluated [name] personally. She feels better today - no new complaints. She is not dizzy anymore. She appeared dry yesterday and was asked to take some oral fluids. Her K was spuriously elevated due to hemolysis from this morning labs, repeat labs have a high normal K - unsure of etiology - kidney function improved, blood sugars better controlled, pulse rate between 100-110 generally.   [name] has a SNF bed today, and I would discharge her and arrange for follow up on K tomorrow. I would also start a low dose torsemide 10 mg which will also help bring K down. She is seen by [facility] cardiology and I would arrange for a follow up in the next week. She will be discharged on an increased dose of coreg, her home dose of insulin. Imdur has been held by [place], and we would defer to cardiology to restart it, since we have increased the dose of coreg.   I have discussed the care of this patient with my IM team residents. Please see the resident note for details.  [place], [place] 12/31/2013, 12:05 PM.

## 2013-12-31 NOTE — Progress Notes (Signed)
CSW provided bed offers to patient and patient's sister at bedside. Patient states she would like [facility] in [place]. CSW spoke to patient's son over the phone [name]) and informed him of possible dc today to [facility] if insurance is received. CSW continues to follow.  Jeanette Caprice, MSW, [place]

## 2013-12-31 NOTE — Progress Notes (Signed)
Physical Therapy Treatment Patient Details Name: [name] MRN: [id number] DOB: 1941-02-06 Today's Date: 12/31/2013    History of Present Illness Pt is a 73 y/o female admitted with hyperglycemia. Her blood sugar when she arrived here was 598 on CBG and 663 on BMP. Pt has been home 2 weeks from [facility] at the SNF level prior to returning this admission.    PT Comments    Pt progressing slowly towards goals, continues to be lethargic but more interactive when mobilizing. Pt ambulated 12' with min A and RW as well as performing transfers and standing exercises with +2 mod A. Updated d/c rec to SNF for pt's safety and continued rehab. PT will continue to follow.   Follow Up Recommendations  SNF;Supervision/Assistance - 24 hour     Equipment Recommendations  None recommended by PT    Recommendations for Other Services       Precautions / Restrictions Precautions Precautions: Fall Precaution Comments: Reports has had one fall since returning home from SNF 2 weeks ago, Restrictions Weight Bearing Restrictions: No    Mobility  Bed Mobility               General bed mobility comments: pt up in recliner  Transfers Overall transfer level: Needs assistance Equipment used: 2 person hand held assist;Rolling walker (2 wheeled) Transfers: Sit to/from Stand Sit to Stand: Mod assist;+2 safety/equipment         General transfer comment: performed sit to stand transfer 3x and required mod A ~50% first 2x, last time pt was fatigued and required mod A 70% with one person on each side. Assist needed for wt-shift fwd and power up as well as vc's to push through arms of chair, pt tends to use momentum and not really push down with arms  Ambulation/Gait Ambulation/Gait assistance: Min assist Ambulation Distance (Feet): 12 Feet Assistive device: Rolling walker (2 wheeled) Gait Pattern/deviations: Step-through pattern;Decreased stride length;Wide base of support;Trunk flexed Gait  velocity: Decreased   General Gait Details: chair kept closely behind pt, encouragement for pt to ambulate all the way to target on floor, as pt became fatigued very quickly   Stairs            Wheelchair Mobility    Modified Rankin (Stroke Patients Only)       Balance Overall balance assessment: Needs assistance Sitting-balance support: Feet supported;No upper extremity supported Sitting balance-Leahy Scale: Fair     Standing balance support: Bilateral upper extremity supported;During functional activity Standing balance-Leahy Scale: Poor                      Cognition Arousal/Alertness: Lethargic Behavior During Therapy: Flat affect Overall Cognitive Status: Within Functional Limits for tasks assessed                      Exercises General Exercises - Lower Extremity Ankle Circles/Pumps: AROM;Both;10 reps;Seated Long Arc Quad: AROM;Both;10 reps;Seated Straight Leg Raises: AROM;Both;10 reps;Seated Hip Flexion/Marching: AROM;Both;20 reps;Seated;Standing    General Comments General comments (skin integrity, edema, etc.): pt lethargic at beginning of session with seated exercises but became more alert with standing mobility. However, at end of session, pt became very fatigued and was once again lethargic and not following instructional commands as well. Was instructed to perform leg extension exercises in standing and went back to marching that she had done earlier      Pertinent Vitals/Pain No c.o pain during session    Home Living  Prior Function            PT Goals (current goals can now be found in the care plan section) Acute Rehab PT Goals Patient Stated Goal: to get stronger PT Goal Formulation: With patient Time For Goal Achievement: 01/12/14 Potential to Achieve Goals: Good Progress towards PT goals: Progressing toward goals    Frequency  Min 3X/week    PT Plan Discharge plan needs to be updated     Co-evaluation             End of Session Equipment Utilized During Treatment: Gait belt;Oxygen Activity Tolerance: Patient tolerated treatment well Patient left: in chair;with chair alarm set;with call bell/phone within reach     Time: 0903-0927 PT Time Calculation (min): 24 min  Charges:  $Therapeutic Exercise: 8-22 mins $Therapeutic Activity: 8-22 mins                    G Codes:     Leighton Roach, PT  Acute Rehab Services  [place], [place] 12/31/2013, 11:09 AM

## 2013-12-31 NOTE — Progress Notes (Addendum)
Clinical Social Worker facilitated patient discharge including contacting patient, family and facility to confirm patient discharge plans.  Clinical information faxed to facility and patient agreeable with plan.  CSW arranged for [facility] to pickup patient for 3:30pm. RN to call report prior to discharge.  Clinical Social Worker will sign off for now as social work intervention is no longer needed. Please consult [place] again if new need arises.  Jeanette Caprice, MSW, [place]

## 2013-12-31 NOTE — Progress Notes (Addendum)
ANTICOAGULATION CONSULT NOTE - Follow Up Consult  Pharmacy Consult for coumadin Indication: atrial fibrillation  Allergies  Allergen Reactions  . Codeine Other (See Comments)    Childhood allergy   Patient Measurements: Height: 5\' 3"  (160 cm) Weight: 270 lb 8 oz (122.698 kg) IBW/kg (Calculated) : 52.4  Vital Signs: Temp: 97.4 F (36.3 C) (07/29 0511) Temp src: Oral (07/29 0511) BP: 157/76 mmHg (07/29 0454) Pulse Rate: 115 (07/29 0454)  Labs:  Recent Labs  12/29/13 0839 12/30/13 0420 12/30/13 0711 12/30/13 1457 12/30/13 1925 12/31/13 0822 12/31/13 0955  HGB  --  10.0*  --   --   --   --  11.0*  HCT  --  33.7*  --   --   --   --  36.7  PLT  --  107*  --   --   --   --  89*  LABPROT 31.4* 34.7*  --   --   --  31.0*  --   INR 3.03* 3.45*  --   --   --  2.98*  --   CREATININE  --  2.75*  --   --   --  2.32*  --   TROPONINI  --   --  <0.30 <0.30 <0.30  --   --     Estimated Creatinine Clearance: 27.9 ml/min (by C-G formula based on Cr of 2.32).  Assessment: 73 yo female admitted with hyperosmolar non-ketotic state. Pt on warfarin PTA for afib- home dose 7.5mg  daily. INR therapeutic on admission. INR did increase to 3.45 yesterday morning- likely from antibiotics (discontinued already). Dose was held last evening. INR this morning is within range at 2.98. Hgb low but stable at 11. Plts have trended down further to 89. No bleeding documented.  Goal of Therapy:  INR 2-3 Monitor platelets by anticoagulation protocol: Yes   Plan:  1. Warfarin 5mg  po x1 tonight  2. Daily PT/INR 3. Monitor signs of bleeding  4. CBC in the morning  Talyn Eddie D. Margurete Guaman, PharmD, BCPS Clinical Pharmacist Pager: [phone] 12/31/2013 10:32 AM

## 2013-12-31 NOTE — Progress Notes (Addendum)
Clinical Social Work Department CLINICAL SOCIAL WORK PLACEMENT NOTE 12/31/2013  Patient:  LILIANE, MALLIS  Account Number:  [id number] Admit date:  12/26/2013  Clinical Social Worker:  Megan Salon  Date/time:  12/30/2013 04:14 PM  Clinical Social Work is seeking post-discharge placement for this patient at the following level of care:   [place]   (*CSW will update this form in Epic as items are completed)   12/30/2013  Patient/family provided with [place] Department of Clinical Social Work's list of facilities offering this level of care within the geographic area requested by the patient (or if unable, by the patient's family).  12/30/2013  Patient/family informed of their freedom to choose among providers that offer the needed level of care, that participate in Medicare, Medicaid or managed care program needed by the patient, have an available bed and are willing to accept the patient.  12/30/2013  Patient/family informed of [facility]' ownership interest in [facility], as well as of the fact that they are under no obligation to receive care at this facility.  PASARR submitted to EDS on 12/30/2013 PASARR number received on 12/30/2013  FL2 transmitted to all facilities in geographic area requested by pt/family on  12/30/2013 FL2 transmitted to all facilities within larger geographic area on   Patient informed that his/her managed care company has contracts with or will negotiate with  certain facilities, including the following:     Patient/family informed of bed offers received:  12/31/2013 Patient chooses bed at [facility] Physician recommends and patient chooses bed at    Patient to be transferred to [facility] on  12/31/2013 Patient to be transferred to facility by son Patient and family notified of transfer on 12/31/2013 Name of family member notified:  Albertha Ghee  The following physician request were entered in  Epic:   Additional Comments:  Jeanette Caprice, MSW, [place]

## 2013-12-31 NOTE — Progress Notes (Signed)
Subjective: [name]. Patient just saw PT/OT prior to interview and exam and said that she enjoyed it. Her only complaint is that she is tired and does not think she is getting adequate rest here. She did appear more alert and engaging than previously. She denies chest pain, SOB, palpitations, diaphoresis, nausea, emesis, fevers, or chills.  Objective: Vital signs in last 24 hours: Filed Vitals:   12/31/13 0454 12/31/13 0511 12/31/13 0853 12/31/13 1100  BP: 157/76   116/79  Pulse: 115     Temp: 96.9 F (36.1 C) 97.4 F (36.3 C)    TempSrc: Oral Oral    Resp: 18     Height:      Weight: 270 lb 8 oz (122.698 kg)     SpO2: 99%  98%    Weight change: -63 lb 8 oz (-28.803 kg)  Intake/Output Summary (Last 24 hours) at 12/31/13 1139 Last data filed at 12/31/13 0900  Gross per 24 hour  Intake    480 ml  Output   1752 ml  Net  -1272 ml   BP 116/79  Pulse 115  Temp(Src) 97.4 F (36.3 C) (Oral)  Resp 18  Ht 5\' 3"  (1.6 m)  Wt 270 lb 8 oz (122.698 kg)  BMI 47.93 kg/m2  SpO2 98%  General Appearance:    Obese, alert sitting up in chair, NAD  HEENT:    Normocephalic, dry MM, PERRL  Neck:   No carotid bruit or JVD  Back:     Symmetric, no curvature  Lungs:     Clear to auscultation bilaterally but very difficult to assess from habitus and very poor effort by patient    Heart:    Tachycardic, irregularly irregular rate and rhythm, S1 and S2 normal, no murmur, rub  or gallop, tenderness to palpation over sternum and L chest  Abdomen:     Soft, non-tender, bowel sounds active all four quadrants,    no masses, no organomegaly  Extremities:   Extremities normal, atraumatic, no cyanosis. 0-1+ b/l pitting edema in LE unchanged  Pulses:   2+ and symmetric radial and DP pulses   Lab Results: Basic Metabolic Panel:  Recent Labs Lab 12/31/13 0822 12/31/13 0955  NA 139 139  K 5.8* 5.3  CL 105 106  CO2 18* 19  GLUCOSE 210* 223*  BUN 50* 50*  CREATININE 2.32* 2.44*  CALCIUM 7.9* 7.9*     Liver Function Tests:  Recent Labs Lab 12/26/13 1254  AST 23  ALT 36*  ALKPHOS 104  BILITOT 0.5  PROT 7.6  ALBUMIN 3.3*   CBC:  Recent Labs Lab 12/26/13 1254  12/30/13 0420 12/31/13 0955  WBC 9.5  < > 6.4 7.0  NEUTROABS 6.9  --   --   --   HGB 11.2*  < > 10.0* 11.0*  HCT 37.1  < > 33.7* 36.7  MCV 93.7  < > 93.1 95.3  PLT 116*  < > 107* 89*  < > = values in this interval not displayed. Cardiac Enzymes:  Recent Labs Lab 12/30/13 0711 12/30/13 1457 12/30/13 1925  TROPONINI <0.30 <0.30 <0.30   CBG:  Recent Labs Lab 12/30/13 0613 12/30/13 1104 12/30/13 1627 12/30/13 2155 12/31/13 0624 12/31/13 1129  GLUCAP 171* 215* 186* 140* 180* 182*   Hemoglobin A1C:  Recent Labs Lab 12/26/13 1614  HGBA1C 9.8*   Coagulation:  Recent Labs Lab 12/28/13 0725 12/29/13 0839 12/30/13 0420 12/31/13 0822  LABPROT 27.6* 31.4* 34.7* 31.0*  INR 2.57* 3.03*  3.45* 2.98*   Urinalysis:  Recent Labs Lab 12/26/13 1235  COLORURINE YELLOW  LABSPEC 1.023  PHURINE 5.5  GLUCOSEU >1000*  HGBUR SMALL*  BILIRUBINUR NEGATIVE  KETONESUR NEGATIVE  PROTEINUR 100*  UROBILINOGEN 0.2  NITRITE NEGATIVE  LEUKOCYTESUR SMALL*   Misc. Labs: Qualitative ketones negative Lactic acid 2.2 Salicylate level <6.0  Micro Results: Recent Results (from the past 240 hour(s))  MRSA PCR SCREENING     Status: Abnormal   Collection Time    12/26/13  4:20 PM      Result Value Ref Range Status   MRSA by PCR POSITIVE (*) NEGATIVE Final   Comment:            The GeneXpert MRSA Assay (FDA     approved for NASAL specimens     only), is one component of a     comprehensive MRSA colonization     surveillance program. It is not     intended to diagnose MRSA     infection nor to guide or     monitor treatment for     MRSA infections.     RESULT CALLED TO, READ BACK BY AND VERIFIED WITH:     Dulcy Fanny RN 2148 12/26/13 A [facility]  URINE CULTURE     Status: None   Collection Time     12/27/13  2:37 PM      Result Value Ref Range Status   Specimen Description URINE, CLEAN CATCH   Final   Special Requests NONE   Final   Culture  Setup Time     Final   Value: 12/27/2013 21:12     Performed at [place]     Final   Value: 95,000 COLONIES/ML     Performed at [facility]   Culture     Final   Value: Multiple bacterial morphotypes present, none predominant. Suggest appropriate recollection if clinically indicated.     Performed at [facility]   Report Status 12/28/2013 FINAL   Final   Studies/Results: Dg Chest Port 1 View  12/30/2013   CLINICAL DATA:  Chest pain, shortness of breath  EXAM: PORTABLE CHEST - 1 VIEW  COMPARISON:  12/28/2013  FINDINGS: Stable cardiomegaly with similar vascular congestion/mild interstitial edema. Hazy opacity in the right hemithorax suggest an enlarging posterior layering effusion. No significant left effusion. No pneumothorax. Atherosclerosis of the aorta.  IMPRESSION: Cardiomegaly with similar mild interstitial edema pattern and basilar atelectasis  Enlarging right pleural effusion.   Electronically Signed   By: Daryll Brod M.D.   On: 12/30/2013 08:17   EKG 7/28 06:12 am unchanged from 7/26, atrial fibrillation, RBBB, PVCs  Medications: I have reviewed the patient's current medications. Scheduled Meds: . antiseptic oral rinse  15 mL Mouth Rinse BID  . aspirin EC  81 mg Oral Daily  . atorvastatin  80 mg Oral Daily  . budesonide  0.25 mg Nebulization BID  . carvedilol  12.5 mg Oral BID WC  . furosemide  80 mg Intravenous Once  . insulin aspart  0-9 Units Subcutaneous TID WC  . insulin aspart  10 Units Intravenous Once  . insulin glargine  20 Units Subcutaneous QHS  . sodium chloride  500 mL Intravenous Once  . warfarin  5 mg Oral ONCE-1800  . Warfarin - Pharmacist Dosing Inpatient   Does not apply q1800   Continuous Infusions:   PRN Meds:.acetaminophen, dextrose, diclofenac  sodium Assessment/Plan:  # Atrial Fibrillation w  RVR: Likely exacerbated by volume depletion in setting of HHS that has now resolved. 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 Pt anticoagulated with warfarin per pharmacy INR 2.98 today. Last echo 09/26/2013 with EF 60-65%, no wall motion abnormalities, R ventricular systolic pressure increased.  HR is stable today in the 90s-110s. BP from 116/79 lying to 134/83 sitting. Patient had been encouraged to drink 500 cc water. - Warfarin per pharmacy  -carvedilol 12.5 mg po BID  -cardiac monitoring -transfer to tele -hold torsemide for now as she appeared dry on exam, resume on discharge  #Chest pain: Patient currently denies chest pain but had had pleuritic chest pain that is reproduced by palpation. This appears to be musculoskeletal. Repeat EKG unchanged from 7/26. Troponin neg x 3 -diclofenac transdermal gel 2 g QIDPRN -cardiac monitoring  #Hyperosmolar Hyperglycemic State-resolved: Patient admitted with BG>600, anion gap of 19 and potassium of 6.6 likely in the setting of DM2 medical noncompliance. Infectious workup revealed UA small LE UCx multiple bacterial morphotypes and CXR with possible PNA vs fluid. Very poor quality CXR images with portable and underpenetrated 2view. CBG 140s-210 this morning and last night  -carb modified diet -lantus 20u qhs, SSI-sensitive -CBG TIDAC + QHS  -UCx multiple bacterial morphotypes, none predominant 95,000 colonies, so d/ced keflex  # CAD: Patient intermittently endorsed some left sided chest pain on initial interview. Troponin negative x 3EKG unchanged from prior. See chest pain above. -cont ASA 81 mg po daily, carvedilol increased as per above  #h/o CVA: In 08/2013 she had difficulty w peripheral vision on right and slurred speech. 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 Carotid u/s had 40-59% proximal R ICA stenosis.  -cont ASA 81 mg po for now   # Acute on CKD stage IV: Creatinine down from 2.75 yesterday to 2.44 today. Was 2.6-2.9 1 month ago. GFR now 19. Appears po liquid has helped her AKI -hold torsemide 40 mg po daily for now while volume down, resume on d/c -cont to monitor   # Chronic Respiratory Failure: No respiratory complains this am. Patient on albuterol 2.5 mg QID, budesonide 0.25 mg BID at home.  -cont albuterol and pulmicort   # Dyslipidemia: Last lipid panel was cholesterol 96, triglycerides 66, HDL 49, LDL 34 on 07/02/2013. Patient currently on atorvastatin 80 mg po daily at home  -cont atorvastatin  Dispo: Disposition is deferred at this time, awaiting improvement of current medical problems.  Anticipated discharge in approximately 2 day(s).   The patient does have a current PCP Dione Housekeeper, MD) and does need an [facility] hospital follow-up appointment after discharge.  The patient does not know have transportation limitations that hinder transportation to clinic appointments.  .Services Needed at time of discharge: Y = Yes, Blank = No PT:   OT:   RN:   Equipment:   Other:     LOS: 5 days   Kelby Aline, MD 12/31/2013, 11:39 AM

## 2014-01-02 NOTE — Discharge Summary (Addendum)
INTERNAL MEDICINE ATTENDING DISCHARGE COSIGN   I evaluated the patient on the day of discharge and discussed the discharge plan with my resident team. I agree with the discharge documentation and disposition - The patient's torsemide has been decreased and IMDUR has been held. The patient will need reassessment regarding restarting these medications as outpatient.   Madilyn Fireman 01/02/2014, 12:51 PM

## 2014-01-05 ENCOUNTER — Telehealth: Payer: Self-pay | Admitting: Cardiology

## 2014-01-06 ENCOUNTER — Telehealth: Payer: Self-pay | Admitting: Cardiology

## 2014-01-06 ENCOUNTER — Encounter: Payer: Self-pay | Admitting: Cardiology

## 2014-01-06 NOTE — Telephone Encounter (Signed)
Closed encounter °

## 2014-01-08 NOTE — Telephone Encounter (Signed)
Closed encounter °

## 2014-01-13 ENCOUNTER — Ambulatory Visit: Payer: Medicare Other | Admitting: Cardiology

## 2014-01-29 ENCOUNTER — Ambulatory Visit (INDEPENDENT_AMBULATORY_CARE_PROVIDER_SITE_OTHER): Payer: Medicare Other | Admitting: Cardiology

## 2014-01-29 ENCOUNTER — Encounter: Payer: Self-pay | Admitting: Cardiology

## 2014-01-29 VITALS — BP 142/96 | HR 104 | Ht 63.0 in | Wt 265.8 lb

## 2014-01-29 DIAGNOSIS — R079 Chest pain, unspecified: Secondary | ICD-10-CM

## 2014-01-29 MED ORDER — CARVEDILOL 25 MG PO TABS: 25.0000 mg | ORAL_TABLET | Freq: Two times a day (BID) | ORAL | Status: AC

## 2014-01-29 NOTE — Progress Notes (Signed)
01/29/2014 [name]   09-21-1940  [id number]  Primary Physicia HASANAJ,XAJE A, MD Primary Cardiologist: Dr. Percival Spanish  HPI:  The patient is a 73 y/o female, followed by Dr. Percival Spanish with a history of diastolic heart failure and chronic atrial fibrillation on warfarin therapy. She presents to clinic today for post hospital f/u. She was recently admitted to [facility] on 12/26/13 for hyperosmolar hyperglycemic state. Initial presentation was weakness and confusion. Blood glucose on arrival was greater than 600, anion gap was 19 and potassium was 6.6. This was felt to be in the setting of type 2 diabetes mellitus and medical noncompliance. She was admitted by internal medicine and treated accordingly. During her hospitalization she went to atrial fibrillation with RVR. This was felt to be likely due to volume depletion in the setting of HHS. Rate control was achieved with IV metoprolol. Prior to discharge, her home dose of Coreg was increased to 6.25 mg up to 12.5 mg twice a day. He was ultimately discharged to a skilled nursing facility for rehabilitation.  She presents back to clinic today for followup. EKG demonstrates atrial fibrillation with a resting heart rate at 104 beats per minute. Blood pressure is stable at 142/96. She denies any awareness of her arrhythmia. She is completely asymptomatic, denying palpitations, dyspnea, chest discomfort, lightheadedness, dizziness, syncope/near-syncope. She brings with her to clinic her MAR. It does appear that she has been receiving 12.5 mg of Coreg as prescribed as well as warfarin daily.   Current Outpatient Prescriptions  Medication Sig Dispense Refill  . albuterol (PROVENTIL) (2.5 MG/3ML) 0.083% nebulizer solution Take 2.5 mg by nebulization 4 (four) times daily.       Marland Kitchen aluminum & magnesium hydroxide-simethicone (MYLANTA) 500-450-40 MG/5ML suspension Take 15 mLs by mouth every 2 (two) hours as needed for indigestion.      Marland Kitchen aspirin EC 81 MG tablet Take 81  mg by mouth daily.      Marland Kitchen atorvastatin (LIPITOR) 40 MG tablet Take 40 mg by mouth daily.      . Calcium 500 MG tablet Take 500 mg by mouth 3 (three) times daily.      . calcium-vitamin D (OSCAL WITH D) 500-200 MG-UNIT per tablet Take 1 tablet by mouth 3 (three) times daily.      . carvedilol (COREG) 12.5 MG tablet Take 1 tablet (12.5 mg total) by mouth 2 (two) times daily with a meal.  60 tablet  0  . ciprofloxacin (CIPRO) 750 MG tablet Take 750 mg by mouth 2 (two) times daily.      . diphenhydrAMINE (BENADRYL) 25 mg capsule Take 25 mg by mouth every 4 (four) hours as needed for itching.      . ferrous sulfate 325 (65 FE) MG tablet Take 325 mg by mouth daily with breakfast.       . Fluticasone-Salmeterol (ADVAIR) 250-50 MCG/DOSE AEPB Inhale 1 puff into the lungs 2 (two) times daily.      . furosemide (LASIX) 40 MG tablet Take 40 mg by mouth 2 (two) times daily.      Marland Kitchen gabapentin (NEURONTIN) 100 MG capsule Take 100 mg by mouth 3 (three) times daily.      Marland Kitchen HYDROcodone-acetaminophen (NORCO/VICODIN) 5-325 MG per tablet Take 1 tablet by mouth every 6 (six) hours as needed for moderate pain.      Marland Kitchen insulin aspart (NOVOLOG) 100 UNIT/ML injection Inject 5 Units into the skin 3 (three) times daily with meals.      . insulin  glargine (LANTUS) 100 UNIT/ML injection Inject 0.2 mLs (20 Units total) into the skin at bedtime.  20 mL  11  . magnesium hydroxide (MILK OF MAGNESIA) 800 MG/5ML suspension Take 30 mLs by mouth as needed for constipation.      . nitroGLYCERIN (NITROSTAT) 0.4 MG SL tablet Place 1 mg under the tongue every 5 (five) minutes x 3 doses as needed for chest pain. For chest pain      . Omega-3 Fatty Acids (FISH OIL) 1000 MG CAPS Take 1 capsule by mouth 3 (three) times daily.      Marland Kitchen warfarin (COUMADIN) 5 MG tablet Take 7.5 mg by mouth daily.       No current facility-administered medications for this visit.    Allergies  Allergen Reactions  . Codeine Other (See Comments)    Childhood  allergy    History   Social History  . Marital Status: Widowed    Spouse Name: N/A    Number of Children: 4  . Years of Education: 12th   Occupational History  . Retired    Social History Main Topics  . Smoking status: Never Smoker   . Smokeless tobacco: Never Used  . Alcohol Use: No  . Drug Use: No  . Sexual Activity: No   Other Topics Concern  . Not on file   Social History Narrative   Patient lives at home with son, she is widowed with 4 children   Patient is right handed   Patient has high school education   Patient drinks 2 cups daily     Review of Systems: General: negative for chills, fever, night sweats or weight changes.  Cardiovascular: negative for chest pain, dyspnea on exertion, edema, orthopnea, palpitations, paroxysmal nocturnal dyspnea or shortness of breath Dermatological: negative for rash Respiratory: negative for cough or wheezing Urologic: negative for hematuria Abdominal: negative for nausea, vomiting, diarrhea, bright red blood per rectum, melena, or hematemesis Neurologic: negative for visual changes, syncope, or dizziness All other systems reviewed and are otherwise negative except as noted above.    Blood pressure 142/96, pulse 104, height 5\' 3"  (1.6 m), weight 265 lb 12.8 oz (120.566 kg).  General appearance: alert, cooperative, no distress and moderately obese Neck: no carotid bruit and no JVD Lungs: clear to auscultation bilaterally Heart: irregularly irregular rhythm and tachy rate Extremities: 1+ bilateral edema Pulses: 2+ and symmetric Skin: warm and dry Neurologic: Grossly normal  EKG atrial fibrillation 104 bpm  ASSESSMENT AND PLAN:   1. Chronic Atrial Fibrillation: resting rate elevated at 104 bpm. BP stable but elevated at 142/96. Pt is asymptomatic. Will increase Coreg to 25 mg BID. Continue Warfarin for A/C. INRs followed by PCP.  2. Diastolic HF: no reported dyspnea. Pt appears evolemic on physical exam. Continue low  sodium diet, daily weights and BP control. Continue daily Lasix.  PLAN  Patient has not been seen by Dr. Percival Spanish in over a year. Recommended that given her chronic afib and diastolic HF that she re-establish routine care/fu. Pt instructed to f/u with Dr. Percival Spanish in 6 weeks or sooner if needed.   Carney Saxton, BRITTAINYPA-C 01/29/2014 2:23 PM

## 2014-01-29 NOTE — Patient Instructions (Signed)
Please schedule a follow up appointment with your kidney doctor.   Elevate legs several times daily.   Follow up with an MD in [place] in 4-6 weeks (Dr. Percival Spanish does go to [facility])  Your physician has recommended you make the following change in your medication: INCREASE carvedilol to 25mg  twice daily.  Continue all other medications as prescribed.   Follow a low-sodium diet.    Low-Sodium Eating Plan Sodium raises blood pressure and causes water to be held in the body. Getting less sodium from food will help lower your blood pressure, reduce any swelling, and protect your heart, liver, and kidneys. We get sodium by adding salt (sodium chloride) to food. Most of our sodium comes from canned, boxed, and frozen foods. Restaurant foods, fast foods, and pizza are also very high in sodium. Even if you take medicine to lower your blood pressure or to reduce fluid in your body, getting less sodium from your food is important. WHAT IS MY PLAN? Most people should limit their sodium intake to 2,300 mg a day. Your health care provider recommends that you limit your sodium intake to __________ a day.  WHAT DO I NEED TO KNOW ABOUT THIS EATING PLAN? For the low-sodium eating plan, you will follow these general guidelines:  Choose foods with a % Daily Value for sodium of less than 5% (as listed on the food label).   Use salt-free seasonings or herbs instead of table salt or sea salt.   Check with your health care provider or pharmacist before using salt substitutes.   Eat fresh foods.  Eat more vegetables and fruits.  Limit canned vegetables. If you do use them, rinse them well to decrease the sodium.   Limit cheese to 1 oz (28 g) per day.   Eat lower-sodium products, often labeled as "lower sodium" or "no salt added."  Avoid foods that contain monosodium glutamate (MSG). MSG is sometimes added to [place] food and some canned foods.  Check food labels (Nutrition Facts labels)  on foods to learn how much sodium is in one serving.  Eat more home-cooked food and less restaurant, buffet, and fast food.  When eating at a restaurant, ask that your food be prepared with less salt or none, if possible.  HOW DO I READ FOOD LABELS FOR SODIUM INFORMATION? The Nutrition Facts label lists the amount of sodium in one serving of the food. If you eat more than one serving, you must multiply the listed amount of sodium by the number of servings. Food labels may also identify foods as:  Sodium free--Less than 5 mg in a serving.  Very low sodium--35 mg or less in a serving.  Low sodium--140 mg or less in a serving.  Light in sodium--50% less sodium in a serving. For example, if a food that usually has 300 mg of sodium is changed to become light in sodium, it will have 150 mg of sodium.  Reduced sodium--25% less sodium in a serving. For example, if a food that usually has 400 mg of sodium is changed to reduced sodium, it will have 300 mg of sodium. WHAT FOODS CAN I EAT? Grains Low-sodium cereals, including oats, puffed wheat and rice, and shredded wheat cereals. Low-sodium crackers. Unsalted rice and pasta. Lower-sodium bread.  Vegetables Frozen or fresh vegetables. Low-sodium or reduced-sodium canned vegetables. Low-sodium or reduced-sodium tomato sauce and paste. Low-sodium or reduced-sodium tomato and vegetable juices.  Fruits Fresh, frozen, and canned fruit. Fruit juice.  Meat and Other  Protein Products Low-sodium canned tuna and salmon. Fresh or frozen meat, poultry, seafood, and fish. Lamb. Unsalted nuts. Dried beans, peas, and lentils without added salt. Unsalted canned beans. Homemade soups without salt. Eggs.  Dairy Milk. Soy milk. Ricotta cheese. Low-sodium or reduced-sodium cheeses. Yogurt.  Condiments Fresh and dried herbs and spices. Salt-free seasonings. Onion and garlic powders. Low-sodium varieties of mustard and ketchup. Lemon juice.  Fats and  Oils Reduced-sodium salad dressings. Unsalted butter.  Other Unsalted popcorn and pretzels.  The items listed above may not be a complete list of recommended foods or beverages. Contact your dietitian for more options. WHAT FOODS ARE NOT RECOMMENDED? Grains Instant hot cereals. Bread stuffing, pancake, and biscuit mixes. Croutons. Seasoned rice or pasta mixes. Noodle soup cups. Boxed or frozen macaroni and cheese. Self-rising flour. Regular salted crackers. Vegetables Regular canned vegetables. Regular canned tomato sauce and paste. Regular tomato and vegetable juices. Frozen vegetables in sauces. Salted french fries. Olives. Angie Fava. Relishes. Sauerkraut. Salsa. Meat and Other Protein Products Salted, canned, smoked, spiced, or pickled meats, seafood, or fish. Bacon, ham, sausage, hot dogs, corned beef, chipped beef, and packaged luncheon meats. Salt pork. Jerky. Pickled herring. Anchovies, regular canned tuna, and sardines. Salted nuts. Dairy Processed cheese and cheese spreads. Cheese curds. Blue cheese and cottage cheese. Buttermilk.  Condiments Onion and garlic salt, seasoned salt, table salt, and sea salt. Canned and packaged gravies. Worcestershire sauce. Tartar sauce. Barbecue sauce. Teriyaki sauce. Soy sauce, including reduced sodium. Steak sauce. Fish sauce. Oyster sauce. Cocktail sauce. Horseradish. Regular ketchup and mustard. Meat flavorings and tenderizers. Bouillon cubes. Hot sauce. Tabasco sauce. Marinades. Taco seasonings. Relishes. Fats and Oils Regular salad dressings. Salted butter. Margarine. Ghee. Bacon fat.  Other Potato and tortilla chips. Corn chips and puffs. Salted popcorn and pretzels. Canned or dried soups. Pizza. Frozen entrees and pot pies.  The items listed above may not be a complete list of foods and beverages to avoid. Contact your dietitian for more information. Document Released: 11/11/2001 Document Revised: 05/27/2013 Document Reviewed:  03/26/2013 [facility] Patient Information 2015 [place], [place]. This information is not intended to replace advice given to you by your health care provider. Make sure you discuss any questions you have with your health care provider.

## 2014-02-01 ENCOUNTER — Encounter: Payer: Self-pay | Admitting: Cardiology

## 2014-02-12 ENCOUNTER — Emergency Department (HOSPITAL_COMMUNITY): Payer: Medicare Other

## 2014-02-12 ENCOUNTER — Encounter (HOSPITAL_COMMUNITY): Payer: Self-pay | Admitting: Emergency Medicine

## 2014-02-12 ENCOUNTER — Inpatient Hospital Stay (HOSPITAL_COMMUNITY): Payer: Medicare Other

## 2014-02-12 ENCOUNTER — Inpatient Hospital Stay (HOSPITAL_COMMUNITY)
Admission: EM | Admit: 2014-02-12 | Discharge: 2014-02-20 | DRG: 291 | Disposition: A | Discharge status: Skilled Nursing Facility | Payer: Medicare Other | Attending: Internal Medicine | Admitting: Internal Medicine

## 2014-02-12 DIAGNOSIS — I2789 Other specified pulmonary heart diseases: Secondary | ICD-10-CM | POA: Diagnosis present

## 2014-02-12 DIAGNOSIS — R5381 Other malaise: Secondary | ICD-10-CM | POA: Diagnosis present

## 2014-02-12 DIAGNOSIS — J9620 Acute and chronic respiratory failure, unspecified whether with hypoxia or hypercapnia: Secondary | ICD-10-CM | POA: Diagnosis present

## 2014-02-12 DIAGNOSIS — Z794 Long term (current) use of insulin: Secondary | ICD-10-CM | POA: Diagnosis not present

## 2014-02-12 DIAGNOSIS — J4489 Other specified chronic obstructive pulmonary disease: Secondary | ICD-10-CM | POA: Diagnosis present

## 2014-02-12 DIAGNOSIS — E1149 Type 2 diabetes mellitus with other diabetic neurological complication: Secondary | ICD-10-CM | POA: Diagnosis present

## 2014-02-12 DIAGNOSIS — I079 Rheumatic tricuspid valve disease, unspecified: Secondary | ICD-10-CM | POA: Diagnosis present

## 2014-02-12 DIAGNOSIS — IMO0001 Reserved for inherently not codable concepts without codable children: Secondary | ICD-10-CM | POA: Diagnosis present

## 2014-02-12 DIAGNOSIS — I272 Pulmonary hypertension, unspecified: Secondary | ICD-10-CM

## 2014-02-12 DIAGNOSIS — Z7982 Long term (current) use of aspirin: Secondary | ICD-10-CM

## 2014-02-12 DIAGNOSIS — R4781 Slurred speech: Secondary | ICD-10-CM

## 2014-02-12 DIAGNOSIS — E1140 Type 2 diabetes mellitus with diabetic neuropathy, unspecified: Secondary | ICD-10-CM | POA: Diagnosis present

## 2014-02-12 DIAGNOSIS — J449 Chronic obstructive pulmonary disease, unspecified: Secondary | ICD-10-CM | POA: Diagnosis present

## 2014-02-12 DIAGNOSIS — K5900 Constipation, unspecified: Secondary | ICD-10-CM | POA: Diagnosis not present

## 2014-02-12 DIAGNOSIS — E1142 Type 2 diabetes mellitus with diabetic polyneuropathy: Secondary | ICD-10-CM | POA: Diagnosis present

## 2014-02-12 DIAGNOSIS — N184 Chronic kidney disease, stage 4 (severe): Secondary | ICD-10-CM | POA: Diagnosis present

## 2014-02-12 DIAGNOSIS — N179 Acute kidney failure, unspecified: Secondary | ICD-10-CM | POA: Diagnosis not present

## 2014-02-12 DIAGNOSIS — Z7901 Long term (current) use of anticoagulants: Secondary | ICD-10-CM | POA: Diagnosis not present

## 2014-02-12 DIAGNOSIS — E785 Hyperlipidemia, unspecified: Secondary | ICD-10-CM

## 2014-02-12 DIAGNOSIS — I509 Heart failure, unspecified: Secondary | ICD-10-CM

## 2014-02-12 DIAGNOSIS — I5033 Acute on chronic diastolic (congestive) heart failure: Secondary | ICD-10-CM | POA: Diagnosis present

## 2014-02-12 DIAGNOSIS — E662 Morbid (severe) obesity with alveolar hypoventilation: Secondary | ICD-10-CM | POA: Diagnosis present

## 2014-02-12 DIAGNOSIS — E1110 Type 2 diabetes mellitus with ketoacidosis without coma: Secondary | ICD-10-CM

## 2014-02-12 DIAGNOSIS — G2581 Restless legs syndrome: Secondary | ICD-10-CM

## 2014-02-12 DIAGNOSIS — R740 Nonspecific elevation of levels of transaminase and lactic acid dehydrogenase [LDH]: Secondary | ICD-10-CM

## 2014-02-12 DIAGNOSIS — Z79899 Other long term (current) drug therapy: Secondary | ICD-10-CM | POA: Diagnosis not present

## 2014-02-12 DIAGNOSIS — I252 Old myocardial infarction: Secondary | ICD-10-CM

## 2014-02-12 DIAGNOSIS — R0602 Shortness of breath: Secondary | ICD-10-CM | POA: Diagnosis present

## 2014-02-12 DIAGNOSIS — N039 Chronic nephritic syndrome with unspecified morphologic changes: Secondary | ICD-10-CM

## 2014-02-12 DIAGNOSIS — I214 Non-ST elevation (NSTEMI) myocardial infarction: Secondary | ICD-10-CM

## 2014-02-12 DIAGNOSIS — E1310 Other specified diabetes mellitus with ketoacidosis without coma: Secondary | ICD-10-CM | POA: Diagnosis present

## 2014-02-12 DIAGNOSIS — I4891 Unspecified atrial fibrillation: Secondary | ICD-10-CM | POA: Diagnosis present

## 2014-02-12 DIAGNOSIS — E1165 Type 2 diabetes mellitus with hyperglycemia: Secondary | ICD-10-CM

## 2014-02-12 DIAGNOSIS — I130 Hypertensive heart and chronic kidney disease with heart failure and stage 1 through stage 4 chronic kidney disease, or unspecified chronic kidney disease: Principal | ICD-10-CM | POA: Diagnosis present

## 2014-02-12 DIAGNOSIS — N185 Chronic kidney disease, stage 5: Secondary | ICD-10-CM

## 2014-02-12 DIAGNOSIS — Z8249 Family history of ischemic heart disease and other diseases of the circulatory system: Secondary | ICD-10-CM | POA: Diagnosis not present

## 2014-02-12 DIAGNOSIS — E875 Hyperkalemia: Secondary | ICD-10-CM | POA: Diagnosis not present

## 2014-02-12 DIAGNOSIS — I50810 Right heart failure, unspecified: Secondary | ICD-10-CM

## 2014-02-12 DIAGNOSIS — G4733 Obstructive sleep apnea (adult) (pediatric): Secondary | ICD-10-CM | POA: Diagnosis present

## 2014-02-12 DIAGNOSIS — N189 Chronic kidney disease, unspecified: Principal | ICD-10-CM

## 2014-02-12 DIAGNOSIS — Z6841 Body Mass Index (BMI) 40.0 and over, adult: Secondary | ICD-10-CM

## 2014-02-12 DIAGNOSIS — IMO0002 Reserved for concepts with insufficient information to code with codable children: Secondary | ICD-10-CM | POA: Diagnosis present

## 2014-02-12 DIAGNOSIS — D631 Anemia in chronic kidney disease: Secondary | ICD-10-CM | POA: Diagnosis present

## 2014-02-12 DIAGNOSIS — R82998 Other abnormal findings in urine: Secondary | ICD-10-CM | POA: Diagnosis present

## 2014-02-12 DIAGNOSIS — Z800 Family history of malignant neoplasm of digestive organs: Secondary | ICD-10-CM

## 2014-02-12 DIAGNOSIS — N2581 Secondary hyperparathyroidism of renal origin: Secondary | ICD-10-CM | POA: Diagnosis present

## 2014-02-12 DIAGNOSIS — J9601 Acute respiratory failure with hypoxia: Secondary | ICD-10-CM

## 2014-02-12 DIAGNOSIS — R8290 Unspecified abnormal findings in urine: Secondary | ICD-10-CM

## 2014-02-12 DIAGNOSIS — J9611 Chronic respiratory failure with hypoxia: Secondary | ICD-10-CM

## 2014-02-12 DIAGNOSIS — Z9981 Dependence on supplemental oxygen: Secondary | ICD-10-CM

## 2014-02-12 DIAGNOSIS — I10 Essential (primary) hypertension: Secondary | ICD-10-CM

## 2014-02-12 DIAGNOSIS — R748 Abnormal levels of other serum enzymes: Secondary | ICD-10-CM

## 2014-02-12 DIAGNOSIS — R7401 Elevation of levels of liver transaminase levels: Secondary | ICD-10-CM

## 2014-02-12 DIAGNOSIS — B029 Zoster without complications: Secondary | ICD-10-CM

## 2014-02-12 LAB — CBC
HCT: 34.9 % — ABNORMAL LOW (ref 36.0–46.0)
Hemoglobin: 11.1 g/dL — ABNORMAL LOW (ref 12.0–15.0)
MCH: 28 pg (ref 26.0–34.0)
MCHC: 31.8 g/dL (ref 30.0–36.0)
MCV: 87.9 fL (ref 78.0–100.0)
PLATELETS: 185 10*3/uL (ref 150–400)
RBC: 3.97 MIL/uL (ref 3.87–5.11)
RDW: 16.9 % — AB (ref 11.5–15.5)
WBC: 8.1 10*3/uL (ref 4.0–10.5)

## 2014-02-12 LAB — BASIC METABOLIC PANEL
ANION GAP: 17 — AB (ref 5–15)
BUN: 49 mg/dL — ABNORMAL HIGH (ref 6–23)
CO2: 22 mEq/L (ref 19–32)
Calcium: 8.5 mg/dL (ref 8.4–10.5)
Chloride: 97 mEq/L (ref 96–112)
Creatinine, Ser: 2.01 mg/dL — ABNORMAL HIGH (ref 0.50–1.10)
GFR calc non Af Amer: 24 mL/min — ABNORMAL LOW (ref >90)
GFR, EST AFRICAN AMERICAN: 27 mL/min — AB (ref >90)
Glucose, Bld: 443 mg/dL — ABNORMAL HIGH (ref 70–99)
Potassium: 4.9 mEq/L (ref 3.7–5.3)
Sodium: 136 mEq/L — ABNORMAL LOW (ref 137–147)

## 2014-02-12 LAB — CBG MONITORING, ED
Glucose-Capillary: 427 mg/dL — ABNORMAL HIGH (ref 70–99)
Glucose-Capillary: 353 mg/dL — ABNORMAL HIGH (ref 70–99)
Glucose-Capillary: 331 mg/dL — ABNORMAL HIGH (ref 70–99)

## 2014-02-12 LAB — I-STAT TROPONIN, ED: Troponin i, poc: 0 ng/mL (ref 0.00–0.08)

## 2014-02-12 LAB — PROTIME-INR
INR: 1.67 — ABNORMAL HIGH (ref 0.00–1.49)
Prothrombin Time: 19.7 seconds — ABNORMAL HIGH (ref 11.6–15.2)

## 2014-02-12 LAB — PRO B NATRIURETIC PEPTIDE: PRO B NATRI PEPTIDE: 5454 pg/mL — AB (ref 0–125)

## 2014-02-12 LAB — I-STAT CG4 LACTIC ACID, ED: Lactic Acid, Venous: 2.39 mmol/L — ABNORMAL HIGH (ref 0.5–2.2)

## 2014-02-12 LAB — MAGNESIUM: Magnesium: 1.9 mg/dL (ref 1.5–2.5)

## 2014-02-12 MED ORDER — ATORVASTATIN CALCIUM 40 MG PO TABS
40.0000 mg | ORAL_TABLET | Freq: Every evening | ORAL | Status: DC
  Administered 2014-02-13 – 2014-02-19 (×7): 40 mg via ORAL
  Filled 2014-02-12 (×8): qty 1

## 2014-02-12 MED ORDER — DEXTROSE-NACL 5-0.45 % IV SOLN: INTRAVENOUS | Status: DC

## 2014-02-12 MED ORDER — ASPIRIN EC 81 MG PO TBEC
81.0000 mg | DELAYED_RELEASE_TABLET | Freq: Every day | ORAL | Status: DC
  Administered 2014-02-13 – 2014-02-20 (×8): 81 mg via ORAL
  Filled 2014-02-12 (×8): qty 1

## 2014-02-12 MED ORDER — POTASSIUM CHLORIDE 10 MEQ/100ML IV SOLN
10.0000 meq | INTRAVENOUS | Status: AC
  Administered 2014-02-13 (×4): 10 meq via INTRAVENOUS
  Filled 2014-02-12 (×4): qty 100

## 2014-02-12 MED ORDER — ESMOLOL HCL-SODIUM CHLORIDE 2000 MG/100ML IV SOLN
25.0000 ug/kg/min | INTRAVENOUS | Status: DC
  Filled 2014-02-12: qty 100

## 2014-02-12 MED ORDER — CYANOCOBALAMIN 500 MCG PO TABS
500.0000 ug | ORAL_TABLET | Freq: Every day | ORAL | Status: DC
  Administered 2014-02-14 – 2014-02-20 (×7): 500 ug via ORAL
  Filled 2014-02-12 (×8): qty 1

## 2014-02-12 MED ORDER — FISH OIL 1000 MG PO CAPS: 1.0000 | ORAL_CAPSULE | Freq: Three times a day (TID) | ORAL | Status: DC

## 2014-02-12 MED ORDER — DEXTROSE 5 % IV SOLN
2.0000 g | INTRAVENOUS | Status: DC
  Administered 2014-02-13: 2 g via INTRAVENOUS
  Filled 2014-02-12: qty 2

## 2014-02-12 MED ORDER — VITAMIN B-12 500 MCG PO TABS: 500.0000 ug | ORAL_TABLET | Freq: Every day | ORAL | Status: DC

## 2014-02-12 MED ORDER — SODIUM CHLORIDE 0.9 % IV SOLN
INTRAVENOUS | Status: DC
  Administered 2014-02-12: 2.7 [IU]/h via INTRAVENOUS
  Filled 2014-02-12: qty 2.5

## 2014-02-12 MED ORDER — ESMOLOL HCL-SODIUM CHLORIDE 2000 MG/100ML IV SOLN
25.0000 ug/kg/min | Freq: Once | INTRAVENOUS | Status: AC
  Administered 2014-02-12: 25 ug/kg/min via INTRAVENOUS
  Filled 2014-02-12: qty 100

## 2014-02-12 MED ORDER — CALCIUM 500 MG PO TABS
500.0000 mg | ORAL_TABLET | Freq: Three times a day (TID) | ORAL | Status: DC
Start: 2014-02-12 — End: 2014-02-12

## 2014-02-12 MED ORDER — SODIUM CHLORIDE 0.9 % IV SOLN
1500.0000 mg | INTRAVENOUS | Status: DC
  Filled 2014-02-12: qty 1500

## 2014-02-12 MED ORDER — TRAMADOL HCL 50 MG PO TABS
50.0000 mg | ORAL_TABLET | Freq: Four times a day (QID) | ORAL | Status: DC | PRN
  Administered 2014-02-13 – 2014-02-15 (×4): 50 mg via ORAL
  Filled 2014-02-12 (×4): qty 1

## 2014-02-12 MED ORDER — DILTIAZEM HCL 100 MG IV SOLR
5.0000 mg/h | INTRAVENOUS | Status: DC
  Administered 2014-02-13: 5 mg/h via INTRAVENOUS
  Filled 2014-02-12: qty 100

## 2014-02-12 MED ORDER — NITROGLYCERIN 0.4 MG SL SUBL: 0.4000 mg | SUBLINGUAL_TABLET | SUBLINGUAL | Status: DC | PRN

## 2014-02-12 MED ORDER — VANCOMYCIN HCL 10 G IV SOLR
2500.0000 mg | Freq: Once | INTRAVENOUS | Status: AC
  Administered 2014-02-13: 2500 mg via INTRAVENOUS
  Filled 2014-02-12: qty 2500

## 2014-02-12 MED ORDER — FUROSEMIDE 10 MG/ML IJ SOLN
80.0000 mg | Freq: Once | INTRAMUSCULAR | Status: AC
  Administered 2014-02-12: 80 mg via INTRAVENOUS
  Filled 2014-02-12: qty 8

## 2014-02-12 MED ORDER — DEXTROSE-NACL 5-0.45 % IV SOLN
INTRAVENOUS | Status: DC
  Administered 2014-02-13: 02:00:00 via INTRAVENOUS

## 2014-02-12 MED ORDER — SODIUM CHLORIDE 0.9 % IV SOLN
INTRAVENOUS | Status: DC
  Filled 2014-02-12: qty 2.5

## 2014-02-12 MED ORDER — WARFARIN SODIUM 5 MG PO TABS
5.0000 mg | ORAL_TABLET | ORAL | Status: AC
  Administered 2014-02-13: 5 mg via ORAL
  Filled 2014-02-12: qty 1

## 2014-02-12 MED ORDER — FERROUS SULFATE 325 (65 FE) MG PO TABS
325.0000 mg | ORAL_TABLET | Freq: Every day | ORAL | Status: DC
  Administered 2014-02-14: 325 mg via ORAL
  Filled 2014-02-12 (×3): qty 1

## 2014-02-12 MED ORDER — MOMETASONE FURO-FORMOTEROL FUM 100-5 MCG/ACT IN AERO
2.0000 | INHALATION_SPRAY | Freq: Two times a day (BID) | RESPIRATORY_TRACT | Status: DC
  Administered 2014-02-13 – 2014-02-19 (×14): 2 via RESPIRATORY_TRACT
  Filled 2014-02-12: qty 8.8

## 2014-02-12 MED ORDER — WARFARIN - PHARMACIST DOSING INPATIENT
Freq: Every day | Status: DC
  Administered 2014-02-14: 18:00:00
  Administered 2014-02-16: 1

## 2014-02-12 MED ORDER — HYDROCODONE-ACETAMINOPHEN 5-325 MG PO TABS
1.0000 | ORAL_TABLET | Freq: Four times a day (QID) | ORAL | Status: DC | PRN
  Administered 2014-02-13: 1 via ORAL
  Filled 2014-02-12 (×2): qty 1

## 2014-02-12 MED ORDER — GABAPENTIN 100 MG PO CAPS
100.0000 mg | ORAL_CAPSULE | Freq: Three times a day (TID) | ORAL | Status: DC
  Administered 2014-02-13 – 2014-02-20 (×20): 100 mg via ORAL
  Filled 2014-02-12 (×25): qty 1

## 2014-02-12 MED ORDER — CARVEDILOL 25 MG PO TABS
25.0000 mg | ORAL_TABLET | Freq: Two times a day (BID) | ORAL | Status: DC
  Administered 2014-02-13: 25 mg via ORAL
  Filled 2014-02-12 (×3): qty 1

## 2014-02-12 MED ORDER — DILTIAZEM LOAD VIA INFUSION
10.0000 mg | Freq: Once | INTRAVENOUS | Status: AC
  Administered 2014-02-13: 10 mg via INTRAVENOUS
  Filled 2014-02-12: qty 10

## 2014-02-12 MED ORDER — NITROGLYCERIN 0.6 MG SL SUBL: 1.0000 mg | SUBLINGUAL_TABLET | SUBLINGUAL | Status: DC | PRN

## 2014-02-12 MED ORDER — CALCIUM CARBONATE 1250 (500 CA) MG PO TABS
1.0000 | ORAL_TABLET | Freq: Three times a day (TID) | ORAL | Status: DC
  Administered 2014-02-14 (×3): 500 mg via ORAL
  Filled 2014-02-12 (×7): qty 1

## 2014-02-12 MED ORDER — FUROSEMIDE 10 MG/ML IJ SOLN
40.0000 mg | Freq: Two times a day (BID) | INTRAMUSCULAR | Status: DC
  Filled 2014-02-12 (×2): qty 4

## 2014-02-12 MED ORDER — OMEGA-3-ACID ETHYL ESTERS 1 G PO CAPS
1.0000 g | ORAL_CAPSULE | Freq: Three times a day (TID) | ORAL | Status: DC
  Administered 2014-02-13 – 2014-02-20 (×19): 1 g via ORAL
  Filled 2014-02-12 (×24): qty 1

## 2014-02-12 MED ORDER — ASPIRIN EC 81 MG PO TBEC: 81.0000 mg | DELAYED_RELEASE_TABLET | Freq: Every day | ORAL | Status: DC

## 2014-02-12 MED ORDER — DEXTROSE 50 % IV SOLN: 25.0000 mL | INTRAVENOUS | Status: DC | PRN

## 2014-02-12 MED ORDER — SODIUM CHLORIDE 0.9 % IV SOLN
INTRAVENOUS | Status: DC
  Administered 2014-02-13: via INTRAVENOUS

## 2014-02-12 MED ORDER — INSULIN ASPART 100 UNIT/ML ~~LOC~~ SOLN
10.0000 [IU] | Freq: Once | SUBCUTANEOUS | Status: AC
  Administered 2014-02-12: 10 [IU] via INTRAVENOUS
  Filled 2014-02-12: qty 1

## 2014-02-12 MED ORDER — DOCUSATE SODIUM 100 MG PO CAPS
100.0000 mg | ORAL_CAPSULE | ORAL | Status: DC
  Administered 2014-02-14 – 2014-02-16 (×2): 100 mg via ORAL
  Filled 2014-02-12 (×2): qty 1

## 2014-02-12 NOTE — ED Notes (Signed)
Iv team paged for 2nd iv site.

## 2014-02-12 NOTE — H&P (Signed)
[facility] History and Physical  [name] DXI:[id number] DOB: Aug 02, 1940 DOA: 02/12/2014  Referring physician: ER physician. PCP: PROVIDER NOT IN SYSTEM   Chief Complaint: Chest pain and shortness of breath.  HPI: [name] is a 73 y.o. female with history of atrial fibrillation, CHF, diabetes mellitus, chronic kidney disease stage IV, anemia was referred to the ER by patient's PCP after patient was complaining of chest pain and shortness of breath since waking up in the morning. Patient states that she's been having some chest pressure with shortness of breath since morning. Patient also was noticed to have increased blood sugar. Patient states she has been compliant with her medications. Patient states she has gained weight and also has increased lower extremity edema. She denies any fever chills productive cough. In the ER patient was found to be in A. fib with RVR. Patient was was found to be elevated with anion gap and was started on IV insulin for possible DKA. Patient has been given Lasix 40 mg IV for CHF and was started on Esmolol infusion for A. fib with RVR. Patient's chest x-ray shows right-sided opacity and CT chest was recommended which is pending. Patient otherwise denies any nausea vomiting abdominal pain diarrhea. Denies any headache or any focal deficits. Chest pain is mostly pressure-like retrosternal nonradiating.   Review of Systems: As presented in the history of presenting illness, rest negative.  Past Medical History  Diagnosis Date  . Diabetes mellitus   . Hypertension   . Hyperlipemia   . Fibromyalgia   . Obesity   . Sleep apnea     No CPAP  . Anxiety   . Hiatal hernia   . NSTEMI (non-ST elevated myocardial infarction) 04/27/2011  . Anemia 04/27/2011  . Restless leg syndrome   . CAD (coronary artery disease)     small dominant right coronary artery with diffuse 95% stenosis. The LAD had 70% stenosis in the mid vessel beyond the diagonal branch but was  a small vessel. Circumflex had proximal 30% stenosis.   Marland Kitchen External hemorrhoids   . Arrhythmia 10/11/2011  . Community acquired pneumonia 06/24/2012  . Renal insufficiency 05/06/2011  . Shingles   . CHF exacerbation 10/02/2013   Past Surgical History  Procedure Laterality Date  . Appendectomy    . Fracture surgery      left humerous  . Ganglion cyst removal     Social History:  reports that she has never smoked. She has never used smokeless tobacco. She reports that she does not drink alcohol or use illicit drugs. Where does patient live home. Can patient participate in ADLs? Yes.  Allergies  Allergen Reactions  . Codeine Other (See Comments)    Childhood allergy    Family History:  Family History  Problem Relation Age of Onset  . Stomach cancer Mother   . Heart disease Maternal Grandmother   . Heart disease Maternal Grandfather   . Dementia Father       Prior to Admission medications   Medication Sig Start Date End Date Taking? Authorizing Provider  albuterol (PROVENTIL HFA;VENTOLIN HFA) 108 (90 BASE) MCG/ACT inhaler Inhale 2 puffs into the lungs every 6 (six) hours as needed for wheezing or shortness of breath.   Yes Historical Provider, MD  albuterol (PROVENTIL) (2.5 MG/3ML) 0.083% nebulizer solution Take 2.5 mg by nebulization 4 (four) times daily.    Yes Historical Provider, MD  aluminum & magnesium hydroxide-simethicone (MYLANTA) 500-450-40 MG/5ML suspension Take 15 mLs by mouth every 2 (  two) hours as needed for indigestion.   Yes Historical Provider, MD  aspirin EC 81 MG tablet Take 81 mg by mouth daily.   Yes Historical Provider, MD  atorvastatin (LIPITOR) 40 MG tablet Take 40 mg by mouth daily.   Yes Historical Provider, MD  Calcium 500 MG tablet Take 500 mg by mouth 3 (three) times daily.   Yes Historical Provider, MD  calcium-vitamin D (OSCAL WITH D) 500-200 MG-UNIT per tablet Take 1 tablet by mouth 3 (three) times daily.   Yes Historical Provider, MD  carvedilol  (COREG) 25 MG tablet Take 1 tablet (25 mg total) by mouth 2 (two) times daily with a meal. 01/29/14  Yes Brittainy Simmons, PA-C  Cholecalciferol (VITAMIN D-3) 1000 UNITS CAPS Take 1 capsule by mouth daily.   Yes Historical Provider, MD  diphenhydrAMINE (BENADRYL) 25 mg capsule Take 25 mg by mouth every 4 (four) hours as needed for itching.   Yes Historical Provider, MD  docusate sodium (COLACE) 100 MG capsule Take 100 mg by mouth every other day.   Yes Historical Provider, MD  ferrous sulfate 325 (65 FE) MG tablet Take 325 mg by mouth daily with breakfast.    Yes Historical Provider, MD  Fluticasone-Salmeterol (ADVAIR) 250-50 MCG/DOSE AEPB Inhale 1 puff into the lungs 2 (two) times daily.   Yes Historical Provider, MD  furosemide (LASIX) 40 MG tablet Take 40 mg by mouth 2 (two) times daily.   Yes Historical Provider, MD  gabapentin (NEURONTIN) 100 MG capsule Take 100 mg by mouth 3 (three) times daily.   Yes Historical Provider, MD  HYDROcodone-acetaminophen (NORCO/VICODIN) 5-325 MG per tablet Take 1 tablet by mouth every 6 (six) hours as needed for moderate pain.   Yes Historical Provider, MD  insulin aspart (NOVOLOG) 100 UNIT/ML injection Inject 5 Units into the skin 3 (three) times daily with meals. 11/05/13  Yes Janece Canterbury, MD  insulin glargine (LANTUS) 100 UNIT/ML injection Inject 0.2 mLs (20 Units total) into the skin at bedtime. 11/05/13  Yes Janece Canterbury, MD  nitroGLYCERIN (NITROSTAT) 0.4 MG SL tablet Place 1 mg under the tongue every 5 (five) minutes x 3 doses as needed for chest pain. For chest pain   Yes Historical Provider, MD  Omega-3 Fatty Acids (FISH OIL) 1000 MG CAPS Take 1 capsule by mouth 3 (three) times daily.   Yes Historical Provider, MD  traMADol (ULTRAM) 50 MG tablet Take 50 mg by mouth every 6 (six) hours as needed for moderate pain.   Yes Historical Provider, MD  vitamin B-12 (CYANOCOBALAMIN) 500 MCG tablet Take 500 mcg by mouth daily.   Yes Historical Provider, MD   warfarin (COUMADIN) 5 MG tablet Take 7.5 mg by mouth daily.   Yes Historical Provider, MD    Physical Exam: Filed Vitals:   02/12/14 1945 02/12/14 2006 02/12/14 2007 02/12/14 2030  BP: 142/93 130/81 130/81 119/81  Pulse: 126  146 39  Temp:      TempSrc:      Resp: 20  33 24  Height:      Weight:      SpO2: 99%  97% 99%     General:  Obese not in acute distress.  Eyes: Anicteric no pallor.  ENT: No discharge from the ears eyes nose mouth.  Neck: No mass felt. JVD difficult to assess.  Cardiovascular: S1-S2 heard. Irregular.  Respiratory: No rhonchi or crepitations.  Abdomen: Soft nontender bowel sounds present. No guarding rigidity.  Skin: No rash.  Musculoskeletal: Bilateral lower  extremity edema.  Psychiatric: Appears normal.  Neurologic: Alert awake oriented to time place and person. Moves all extremities.  Labs on Admission:  Basic Metabolic Panel:  Recent Labs Lab 02/12/14 1715 02/12/14 1726  NA 136*  --   K 4.9  --   CL 97  --   CO2 22  --   GLUCOSE 443*  --   BUN 49*  --   CREATININE 2.01*  --   CALCIUM 8.5  --   MG  --  1.9   Liver Function Tests: No results found for this basename: AST, ALT, ALKPHOS, BILITOT, PROT, ALBUMIN,  in the last 168 hours No results found for this basename: LIPASE, AMYLASE,  in the last 168 hours No results found for this basename: AMMONIA,  in the last 168 hours CBC:  Recent Labs Lab 02/12/14 1715  WBC 8.1  HGB 11.1*  HCT 34.9*  MCV 87.9  PLT 185   Cardiac Enzymes: No results found for this basename: CKTOTAL, CKMB, CKMBINDEX, TROPONINI,  in the last 168 hours  BNP (last 3 results)  Recent Labs  10/02/13 0905 10/30/13 2010 02/12/14 1518  PROBNP 6285.0* 4361.0* 5454.0*   CBG:  Recent Labs Lab 02/12/14 1942 02/12/14 2031  GLUCAP 427* 353*    Radiological Exams on Admission: Dg Chest 2 View  02/12/2014   CLINICAL DATA:  Shortness of breath.  Chest pain.  EXAM: CHEST  2 VIEW  COMPARISON:   One-view chest 02/04/2014.  FINDINGS: The heart is enlarged. A persistent scratch the persistent right lower lobe airspace disease and effusion is present. Lung volumes are low. Previously seen edema and pulmonary vascular congestion has resolved. Right hemidiaphragm may be elevated. There are some air bronchograms into the right lower lobe as well.  IMPRESSION: 1. Persistent right lower lobe opacification. Recommend CT the chest with contrast for further evaluation of pneumonia or potential underlying mass. 2. Cardiomegaly without failure.   Electronically Signed   By: Lawrence Santiago M.D.   On: 02/12/2014 16:44    EKG: Independently reviewed. A. fib with RVR.  Assessment/Plan Active Problems:   CKD (chronic kidney disease), stage IV   Atrial fibrillation with RVR   CHF, acute on chronic   DKA (diabetic ketoacidoses)   A-fib   1. A. fib with RVR probably precipitating CHF - patient has been placed on Esmolol infusion which may be changed to Cardizem infusion if cannot be titrated in step down. Try to wean off infusion once patient can take patients oral Coreg. Patient is also short of breath and had symptoms of CHF for which patient has been given Lasix 40 IV and I have placed patient on Lasix 40 mg IV every 12 hourly. Patient's last EF was 60-65% on may 2015. Closely follow intake output and metabolic panel and daily weights. Patient on Coumadin for anticoagulation. Cycle cardiac markers as patient had complained of chest pain. Check thyroid function tests. 2. Abdominal density in the chest x-ray - check CT until then patient will be on empiric antibiotics for possible pneumonia. 3. Possible early DKA - patient has been placed on IV insulin infusion. Once anion gap is corrected change to subcutaneous insulin. Closely follow metabolic panel. Check hemoglobin A1c 4. Chronic kidney disease stage IV - creatinine appears to be at baseline. 5. Chronic anemia - probably from CK D. Follow CBC.    Code  Status: Full code.  Family Communication: Patient's sister at the bedside.  Disposition Plan: Admit to inpatient.  Jamilynn Whitacre N. [facility] Pager [phone].  If 7PM-7AM, please contact night-coverage [email or web address] Password [facility] 02/12/2014, 8:49 PM

## 2014-02-12 NOTE — ED Notes (Signed)
Lab results reported to [place].

## 2014-02-12 NOTE — Progress Notes (Signed)
ANTICOAGULATION/ANTIOBIOTIC CONSULT NOTE - Initial Consult  Pharmacy Consult for Coumadin and vancomycin/cefepime Indication: atrial fibrillation and r/o PNA  Allergies  Allergen Reactions  . Codeine Other (See Comments)    Childhood allergy    Patient Measurements: Height: 5\' 3"  (160 cm) Weight: 263 lb (119.296 kg) IBW/kg (Calculated) : 52.4  Vital Signs: Temp: 98.1 F (36.7 C) (09/10 1714) Temp src: Oral (09/10 1714) BP: 115/90 mmHg (09/10 2200) Pulse Rate: 148 (09/10 2200)  Labs:  Recent Labs  02/12/14 1715  HGB 11.1*  HCT 34.9*  PLT 185  LABPROT 19.7*  INR 1.67*  CREATININE 2.01*    Estimated Creatinine Clearance: 31.6 ml/min (by C-G formula based on Cr of 2.01).   Medical History: Past Medical History  Diagnosis Date  . Diabetes mellitus   . Hypertension   . Hyperlipemia   . Fibromyalgia   . Obesity   . Sleep apnea     No CPAP  . Anxiety   . Hiatal hernia   . NSTEMI (non-ST elevated myocardial infarction) 04/27/2011  . Anemia 04/27/2011  . Restless leg syndrome   . CAD (coronary artery disease)     small dominant right coronary artery with diffuse 95% stenosis. The LAD had 70% stenosis in the mid vessel beyond the diagonal branch but was a small vessel. Circumflex had proximal 30% stenosis.   Marland Kitchen External hemorrhoids   . Arrhythmia 10/11/2011  . Community acquired pneumonia 06/24/2012  . Renal insufficiency 05/06/2011  . Shingles   . CHF exacerbation 10/02/2013    Medications:  Prescriptions prior to admission  Medication Sig Dispense Refill  . albuterol (PROVENTIL HFA;VENTOLIN HFA) 108 (90 BASE) MCG/ACT inhaler Inhale 2 puffs into the lungs every 6 (six) hours as needed for wheezing or shortness of breath.      Marland Kitchen albuterol (PROVENTIL) (2.5 MG/3ML) 0.083% nebulizer solution Take 2.5 mg by nebulization 4 (four) times daily.       Marland Kitchen aluminum & magnesium hydroxide-simethicone (MYLANTA) 500-450-40 MG/5ML suspension Take 15 mLs by mouth every 2 (two)  hours as needed for indigestion.      Marland Kitchen aspirin EC 81 MG tablet Take 81 mg by mouth daily.      Marland Kitchen atorvastatin (LIPITOR) 40 MG tablet Take 40 mg by mouth daily.      . Calcium 500 MG tablet Take 500 mg by mouth 3 (three) times daily.      . calcium-vitamin D (OSCAL WITH D) 500-200 MG-UNIT per tablet Take 1 tablet by mouth 3 (three) times daily.      . carvedilol (COREG) 25 MG tablet Take 1 tablet (25 mg total) by mouth 2 (two) times daily with a meal.  60 tablet  6  . Cholecalciferol (VITAMIN D-3) 1000 UNITS CAPS Take 1 capsule by mouth daily.      . diphenhydrAMINE (BENADRYL) 25 mg capsule Take 25 mg by mouth every 4 (four) hours as needed for itching.      . docusate sodium (COLACE) 100 MG capsule Take 100 mg by mouth every other day.      . ferrous sulfate 325 (65 FE) MG tablet Take 325 mg by mouth daily with breakfast.       . Fluticasone-Salmeterol (ADVAIR) 250-50 MCG/DOSE AEPB Inhale 1 puff into the lungs 2 (two) times daily.      . furosemide (LASIX) 40 MG tablet Take 40 mg by mouth 2 (two) times daily.      Marland Kitchen gabapentin (NEURONTIN) 100 MG capsule Take 100 mg by mouth  3 (three) times daily.      Marland Kitchen HYDROcodone-acetaminophen (NORCO/VICODIN) 5-325 MG per tablet Take 1 tablet by mouth every 6 (six) hours as needed for moderate pain.      Marland Kitchen insulin aspart (NOVOLOG) 100 UNIT/ML injection Inject 5 Units into the skin 3 (three) times daily with meals.      . insulin glargine (LANTUS) 100 UNIT/ML injection Inject 0.2 mLs (20 Units total) into the skin at bedtime.  20 mL  11  . nitroGLYCERIN (NITROSTAT) 0.4 MG SL tablet Place 1 mg under the tongue every 5 (five) minutes x 3 doses as needed for chest pain. For chest pain      . Omega-3 Fatty Acids (FISH OIL) 1000 MG CAPS Take 1 capsule by mouth 3 (three) times daily.      . traMADol (ULTRAM) 50 MG tablet Take 50 mg by mouth every 6 (six) hours as needed for moderate pain.      . vitamin B-12 (CYANOCOBALAMIN) 500 MCG tablet Take 500 mcg by mouth daily.       Marland Kitchen warfarin (COUMADIN) 5 MG tablet Take 7.5 mg by mouth daily.       Scheduled:  . [START ON 02/13/2014] aspirin EC  81 mg Oral Daily  . [START ON 02/13/2014] aspirin EC  81 mg Oral Daily  . [START ON 02/13/2014] atorvastatin  40 mg Oral QPM  . [START ON 02/13/2014] calcium carbonate  1 tablet Oral TID WC  . [START ON 02/13/2014] carvedilol  25 mg Oral BID WC  . [START ON 02/13/2014] vitamin B-12  500 mcg Oral Daily  . [START ON 02/14/2014] docusate sodium  100 mg Oral QODAY  . [START ON 02/13/2014] ferrous sulfate  325 mg Oral Q breakfast  . Fish Oil  1 capsule Oral TID  . furosemide  40 mg Intravenous Q12H  . gabapentin  100 mg Oral TID  . mometasone-formoterol  2 puff Inhalation BID  . potassium chloride  10 mEq Intravenous Q1H   Infusions:  . sodium chloride    . dextrose 5 % and 0.45% NaCl    . esmolol    . insulin (NOVOLIN-R) infusion      Assessment: 73yo female c/o SOB, some left-sided CP, and increased LE swelling, admitted w/ concern for DKA, CXR/CT suggestive of PNA vs mass, to begin IV ABX; also to continue Coumadin for Afib with subtherapeutic INR, last dose taken today PTA.  Goal of Therapy:  INR 2-3 Vancomycin trough 15-20   Plan:  No ABX given in ED; will give vancomycin 2500mg  IV x1 followed by 1500mg  IV Q24H as well as cefepime 2g IV Q24H and monitor CBC, Cx, levels prn; will give additional Coumadin 5mg  (for total dose 12.5mg  today) po x1 tonight and monitor INR for dose adjustments.  Wynona Neat, PharmD, BCPS  02/12/2014,11:16 PM

## 2014-02-12 NOTE — ED Notes (Signed)
Called to speak with family in Triage.  Pt/family report that they were told to come here by pt's PCP and the insurance company to speak with someone re: applying for Medicaid and going back to the NH.  After some investigation, CSW learned that pt has exhausted her NH days with [facility] Medicare and they will no longer pay for pt to go to NH.  Pt needs to apply for LTC Medicaid and explained to family that this had to be done at [place] as pt is a county resident there. Family maintains that pt has no care at home, although she lives with her son and has a Marine scientist who visits.  Family encouraged to apply for Medicaid ASAP so pt can be placed in a facility.  CSW will continue to follow pt abd assist with d/c planning.

## 2014-02-12 NOTE — ED Notes (Signed)
Attempted report x1. 

## 2014-02-12 NOTE — Progress Notes (Signed)
Clinical Social Work Department BRIEF PSYCHOSOCIAL ASSESSMENT 02/12/2014  Patient:  [name], [name]     Account Number:  [id number]     Admit date:  02/12/2014  Clinical Social Worker:  Ulyess Blossom  Date/Time:  02/12/2014 10:41 PM  Referred by:  RN  Date Referred:  02/12/2014 Referred for  SNF Placement   Other Referral:   Interview type:  Patient Other interview type:   Database review. Family interviews.    PSYCHOSOCIAL DATA Living Status:  FAMILY Admitted from facility:   Level of care:   Primary support name:  [name] Primary support relationship to patient:  CHILD, ADULT Degree of support available:   Fair. Pt is alone during the day when son works.    CURRENT CONCERNS Current Concerns  Post-Acute Placement   Other Concerns:    SOCIAL WORK ASSESSMENT / PLAN CSW spoke with pt and several of her family members (son, daughter, and sister) re: d/c planning concerns.  Pt lives at home with one of her sons, but does not have 24 hour care.  Pt also has a nurse who visits several times a week, for whom the son pays.  Pt has been in NHs ([place] and [facility]) for the greater part of the past 5 months and has exhausted her [facility] Medicare days.  Pt has Medicaid and desires placement at d/c in either [place] or [facility]. (Per familiy, [facility] would work best).  Pt is agreeable to placement at d/c.   Assessment/plan status:  Psychosocial Support/Ongoing Assessment of Needs Other assessment/ plan:   Unit CSW will follow for d/c planning/ NHP and facilitate bed search and eventual NH tx.   Information/referral to community resources:    PATIENT'S/FAMILY'S RESPONSE TO PLAN OF CARE: Pt uncomfortable in bed and SOB.  She was appreciative of CSW's early intervention.  Unit CSW to f/u.

## 2014-02-12 NOTE — ED Notes (Signed)
Attempted report x 2 

## 2014-02-12 NOTE — ED Provider Notes (Signed)
CSN: [id number]     Arrival date & time 02/12/14  1500 History   First MD Initiated Contact with Patient 02/12/14 1652     Chief Complaint  Patient presents with  . Shortness of Breath  . Chest Pain     HPI  Patient presents with her daughter who provides much of the history of present illness. Patient presents with chest pain, weakness, dyspnea. Patient also has lower extremity edema, symmetrically, though this has improved over the past 2 days. Patient is a very notable recent history of admission for pneumonia, and subsequent admission to another facility the following day for continued symptoms. Patient was discharged one week ago. In the interval week she has had progression of the aforementioned symptoms. These have developed in spite of home health care. Patiently currently denies fever, chills, vomiting, diarrhea. She does endorse the aforementioned changes, which seemed to be progressive in spite of taking all medication as directed.   Notably, I was the emergency department physician for the patient's last admission.  Past Medical History  Diagnosis Date  . Diabetes mellitus   . Hypertension   . Hyperlipemia   . Fibromyalgia   . Obesity   . Sleep apnea     No CPAP  . Anxiety   . Hiatal hernia   . NSTEMI (non-ST elevated myocardial infarction) 04/27/2011  . Anemia 04/27/2011  . Restless leg syndrome   . CAD (coronary artery disease)     small dominant right coronary artery with diffuse 95% stenosis. The LAD had 70% stenosis in the mid vessel beyond the diagonal branch but was a small vessel. Circumflex had proximal 30% stenosis.   Marland Kitchen External hemorrhoids   . Arrhythmia 10/11/2011  . Community acquired pneumonia 06/24/2012  . Renal insufficiency 05/06/2011  . Shingles   . CHF exacerbation 10/02/2013   Past Surgical History  Procedure Laterality Date  . Appendectomy    . Fracture surgery      left humerous  . Ganglion cyst removal     Family History  Problem  Relation Age of Onset  . Stomach cancer Mother   . Heart disease Maternal Grandmother   . Heart disease Maternal Grandfather   . Dementia Father    History  Substance Use Topics  . Smoking status: Never Smoker   . Smokeless tobacco: Never Used  . Alcohol Use: No   OB History   Grav Para Term Preterm Abortions TAB SAB Ect Mult Living                 Review of Systems  Constitutional:       Per HPI, otherwise negative  HENT:       Per HPI, otherwise negative  Respiratory:       Per HPI, otherwise negative  Cardiovascular:       Per HPI, otherwise negative  Gastrointestinal: Negative for vomiting.  Endocrine:       Negative aside from HPI  Genitourinary:       Neg aside from HPI   Musculoskeletal:       Per HPI, otherwise negative  Skin: Negative.   Neurological: Negative for syncope.      Allergies  Codeine  Home Medications   Prior to Admission medications   Medication Sig Start Date End Date Taking? Authorizing Provider  albuterol (PROVENTIL) (2.5 MG/3ML) 0.083% nebulizer solution Take 2.5 mg by nebulization 4 (four) times daily.     Historical Provider, MD  aluminum & magnesium hydroxide-simethicone (MYLANTA) 500-450-40 MG/5ML  suspension Take 15 mLs by mouth every 2 (two) hours as needed for indigestion.    Historical Provider, MD  aspirin EC 81 MG tablet Take 81 mg by mouth daily.    Historical Provider, MD  atorvastatin (LIPITOR) 40 MG tablet Take 40 mg by mouth daily.    Historical Provider, MD  Calcium 500 MG tablet Take 500 mg by mouth 3 (three) times daily.    Historical Provider, MD  calcium-vitamin D (OSCAL WITH D) 500-200 MG-UNIT per tablet Take 1 tablet by mouth 3 (three) times daily.    Historical Provider, MD  carvedilol (COREG) 25 MG tablet Take 1 tablet (25 mg total) by mouth 2 (two) times daily with a meal. 01/29/14   Brittainy Simmons, PA-C  ciprofloxacin (CIPRO) 750 MG tablet Take 750 mg by mouth 2 (two) times daily.    Historical Provider, MD   diphenhydrAMINE (BENADRYL) 25 mg capsule Take 25 mg by mouth every 4 (four) hours as needed for itching.    Historical Provider, MD  ferrous sulfate 325 (65 FE) MG tablet Take 325 mg by mouth daily with breakfast.     Historical Provider, MD  Fluticasone-Salmeterol (ADVAIR) 250-50 MCG/DOSE AEPB Inhale 1 puff into the lungs 2 (two) times daily.    Historical Provider, MD  furosemide (LASIX) 40 MG tablet Take 40 mg by mouth 2 (two) times daily.    Historical Provider, MD  gabapentin (NEURONTIN) 100 MG capsule Take 100 mg by mouth 3 (three) times daily.    Historical Provider, MD  HYDROcodone-acetaminophen (NORCO/VICODIN) 5-325 MG per tablet Take 1 tablet by mouth every 6 (six) hours as needed for moderate pain.    Historical Provider, MD  insulin aspart (NOVOLOG) 100 UNIT/ML injection Inject 5 Units into the skin 3 (three) times daily with meals. 11/05/13   Janece Canterbury, MD  insulin glargine (LANTUS) 100 UNIT/ML injection Inject 0.2 mLs (20 Units total) into the skin at bedtime. 11/05/13   Janece Canterbury, MD  magnesium hydroxide (MILK OF MAGNESIA) 800 MG/5ML suspension Take 30 mLs by mouth as needed for constipation.    Historical Provider, MD  nitroGLYCERIN (NITROSTAT) 0.4 MG SL tablet Place 1 mg under the tongue every 5 (five) minutes x 3 doses as needed for chest pain. For chest pain    Historical Provider, MD  Omega-3 Fatty Acids (FISH OIL) 1000 MG CAPS Take 1 capsule by mouth 3 (three) times daily.    Historical Provider, MD  warfarin (COUMADIN) 5 MG tablet Take 7.5 mg by mouth daily.    Historical Provider, MD   BP 98/47  Pulse 138  Temp(Src) 98.1 F (36.7 C) (Oral)  Resp 25  Ht 5\' 3"  (1.6 m)  Wt 263 lb (119.296 kg)  BMI 46.60 kg/m2  SpO2 99% Physical Exam  Nursing note and vitals reviewed. Constitutional: She is oriented to person, place, and time.  Sickly-appearing obese elderly female awake, alert, sitting up  HENT:  Head: Normocephalic and atraumatic.  Eyes: Conjunctivae are  normal. Right eye exhibits no discharge. Left eye exhibits no discharge.  Neck: No tracheal deviation present.  Cardiovascular: An irregularly irregular rhythm present. Tachycardia present.   Murmur heard. Pulmonary/Chest: No stridor. Tachypnea noted. She has decreased breath sounds.  Abdominal: There is no tenderness.  Musculoskeletal: She exhibits edema.  This is symmetric bilateral lower extremity edema consistent with chronic changes, no gross deformities.   Neurological: She is alert and oriented to person, place, and time. No cranial nerve deficit.  Skin: Skin is warm.  Patient has trace bleeding from a superficial wound of the right elbow.   Psychiatric: She has a normal mood and affect.    ED Course  Procedures (including critical care time) Labs Review Labs Reviewed  CBC - Abnormal; Notable for the following:    Hemoglobin 11.1 (*)    HCT 34.9 (*)    RDW 16.9 (*)    All other components within normal limits  I-STAT CG4 LACTIC ACID, ED - Abnormal; Notable for the following:    Lactic Acid, Venous 2.39 (*)    All other components within normal limits  BASIC METABOLIC PANEL  PRO B NATRIURETIC PEPTIDE  PROTIME-INR  MAGNESIUM  I-STAT TROPOININ, ED    Imaging Review Dg Chest 2 View  02/12/2014   CLINICAL DATA:  Shortness of breath.  Chest pain.  EXAM: CHEST  2 VIEW  COMPARISON:  One-view chest 02/04/2014.  FINDINGS: The heart is enlarged. A persistent scratch the persistent right lower lobe airspace disease and effusion is present. Lung volumes are low. Previously seen edema and pulmonary vascular congestion has resolved. Right hemidiaphragm may be elevated. There are some air bronchograms into the right lower lobe as well.  IMPRESSION: 1. Persistent right lower lobe opacification. Recommend CT the chest with contrast for further evaluation of pneumonia or potential underlying mass. 2. Cardiomegaly without failure.   Electronically Signed   By: Lawrence Santiago M.D.   On:  02/12/2014 16:44   initial x-ray interpreted as persistent pneumonia, though patient has recent admission for this.    EKG Interpretation   Date/Time:  Thursday February 12 2014 15:05:35 EDT Ventricular Rate:  117 PR Interval:    QRS Duration: 116 QT Interval:  342 QTC Calculation: 477 R Axis:   79 Text Interpretation:  Atrial fibrillation with rapid ventricular response  Low voltage QRS Right bundle branch block Abnormal ECG Atrial fibrillation  with rapid ventricular response Low voltage QRS Right bundle branch block  Abnormal ekg Confirmed by Carmin Muskrat  MD [phone]) on 02/12/2014 5:30:50  PM      I evaluated the patient's EMR, including my notes from her prior visit with admission for hyperkalemia, dyspnea, DKA.   Update: Patient lives in her condition, tachycardic, with appropriate blood pressure. Patient's initial labs notable for hyperglycemia, with anion gap of 19. Patient has chronic kidney disease. Given the patient's ongoing A. fib, RVR, elevated BNP, patient will receive Lasix, esmolol. In an effort to minimize additional fluids, patient will receive insulin pushes, not drip initially.  8:04 PM After discussion with the patient's admitting team, patient will be started on insulin drip as well.  MDM  Patient presents with concern of fatigue, dyspnea, pain.  On exam she is awake, alert, interactive, though she has notably abnormal vital signs, and subscore is found to have hyperglycemia, with likely gap acidosis concern for DKA. Tobacco the BNP, resuscitation of this patient is appropriate Patient was started on a beta blocker, titration for heart rate control, insulin for correction of her hyperglycemic state, and after discussion with cardiology, patient was admitted for further evaluation, management.   CRITICAL CARE Performed by: Carmin Muskrat Total critical care time: 40 Critical care time was exclusive of separately billable procedures and treating  other patients. Critical care was necessary to treat or prevent imminent or life-threatening deterioration. Critical care was time spent personally by me on the following activities: development of treatment plan with patient and/or surrogate as well as nursing, discussions with consultants, evaluation of patient's response to treatment,  examination of patient, obtaining history from patient or surrogate, ordering and performing treatments and interventions, ordering and review of laboratory studies, ordering and review of radiographic studies, pulse oximetry and re-evaluation of patient's condition.    Carmin Muskrat, MD 02/12/14 2004

## 2014-02-12 NOTE — ED Notes (Signed)
[id number] [name] fields

## 2014-02-12 NOTE — ED Notes (Signed)
Iv team returned page. On the way to try for second IV.

## 2014-02-12 NOTE — ED Notes (Signed)
IV team at bedside, informed that pt was taken to CT and will page when pt returns.

## 2014-02-12 NOTE — ED Notes (Signed)
Heart healthy tray ordered for pt.  

## 2014-02-12 NOTE — ED Notes (Signed)
Pt c/o SOB and increased leg swelling with some left sided CP; pt here from PCP with family

## 2014-02-13 DIAGNOSIS — E875 Hyperkalemia: Secondary | ICD-10-CM | POA: Diagnosis present

## 2014-02-13 DIAGNOSIS — E1149 Type 2 diabetes mellitus with other diabetic neurological complication: Secondary | ICD-10-CM

## 2014-02-13 DIAGNOSIS — R8290 Unspecified abnormal findings in urine: Secondary | ICD-10-CM | POA: Insufficient documentation

## 2014-02-13 DIAGNOSIS — J9600 Acute respiratory failure, unspecified whether with hypoxia or hypercapnia: Secondary | ICD-10-CM

## 2014-02-13 DIAGNOSIS — I50810 Right heart failure, unspecified: Secondary | ICD-10-CM | POA: Diagnosis present

## 2014-02-13 DIAGNOSIS — G4733 Obstructive sleep apnea (adult) (pediatric): Secondary | ICD-10-CM | POA: Insufficient documentation

## 2014-02-13 DIAGNOSIS — I272 Pulmonary hypertension, unspecified: Secondary | ICD-10-CM | POA: Diagnosis present

## 2014-02-13 DIAGNOSIS — E1142 Type 2 diabetes mellitus with diabetic polyneuropathy: Secondary | ICD-10-CM

## 2014-02-13 LAB — URINALYSIS, ROUTINE W REFLEX MICROSCOPIC
Bilirubin Urine: NEGATIVE
Glucose, UA: NEGATIVE mg/dL
Hgb urine dipstick: NEGATIVE
KETONES UR: NEGATIVE mg/dL
NITRITE: NEGATIVE
Specific Gravity, Urine: 1.02 (ref 1.005–1.030)
UROBILINOGEN UA: 0.2 mg/dL (ref 0.0–1.0)
pH: 5 (ref 5.0–8.0)

## 2014-02-13 LAB — HEPATIC FUNCTION PANEL
ALBUMIN: 2.8 g/dL — AB (ref 3.5–5.2)
ALK PHOS: 73 U/L (ref 39–117)
ALT: 16 U/L (ref 0–35)
AST: 20 U/L (ref 0–37)
Bilirubin, Direct: <0.2 mg/dL (ref 0.0–0.3)
TOTAL PROTEIN: 6.8 g/dL (ref 6.0–8.3)
Total Bilirubin: 0.4 mg/dL (ref 0.3–1.2)

## 2014-02-13 LAB — BASIC METABOLIC PANEL
ANION GAP: 14 (ref 5–15)
ANION GAP: 15 (ref 5–15)
ANION GAP: 16 — AB (ref 5–15)
Anion gap: 13 (ref 5–15)
BUN: 49 mg/dL — ABNORMAL HIGH (ref 6–23)
BUN: 49 mg/dL — ABNORMAL HIGH (ref 6–23)
BUN: 48 mg/dL — ABNORMAL HIGH (ref 6–23)
BUN: 49 mg/dL — ABNORMAL HIGH (ref 6–23)
CHLORIDE: 100 meq/L (ref 96–112)
CHLORIDE: 101 meq/L (ref 96–112)
CHLORIDE: 103 meq/L (ref 96–112)
CO2: 22 mEq/L (ref 19–32)
CO2: 23 mEq/L (ref 19–32)
CO2: 20 meq/L (ref 19–32)
CO2: 22 meq/L (ref 19–32)
CREATININE: 2.12 mg/dL — AB (ref 0.50–1.10)
Calcium: 8.6 mg/dL (ref 8.4–10.5)
Calcium: 8.2 mg/dL — ABNORMAL LOW (ref 8.4–10.5)
Calcium: 8.3 mg/dL — ABNORMAL LOW (ref 8.4–10.5)
Calcium: 8.7 mg/dL (ref 8.4–10.5)
Chloride: 99 mEq/L (ref 96–112)
Creatinine, Ser: 2.02 mg/dL — ABNORMAL HIGH (ref 0.50–1.10)
Creatinine, Ser: 2.03 mg/dL — ABNORMAL HIGH (ref 0.50–1.10)
Creatinine, Ser: 2.08 mg/dL — ABNORMAL HIGH (ref 0.50–1.10)
GFR calc Af Amer: 27 mL/min — ABNORMAL LOW (ref >90)
GFR calc Af Amer: 27 mL/min — ABNORMAL LOW (ref >90)
GFR calc non Af Amer: 23 mL/min — ABNORMAL LOW (ref >90)
GFR calc non Af Amer: 23 mL/min — ABNORMAL LOW (ref >90)
GFR calc non Af Amer: 23 mL/min — ABNORMAL LOW (ref >90)
GFR, EST AFRICAN AMERICAN: 26 mL/min — AB (ref >90)
GFR, EST AFRICAN AMERICAN: 26 mL/min — AB (ref >90)
GFR, EST NON AFRICAN AMERICAN: 22 mL/min — AB (ref >90)
GLUCOSE: 186 mg/dL — AB (ref 70–99)
Glucose, Bld: 319 mg/dL — ABNORMAL HIGH (ref 70–99)
Glucose, Bld: 204 mg/dL — ABNORMAL HIGH (ref 70–99)
Glucose, Bld: 144 mg/dL — ABNORMAL HIGH (ref 70–99)
POTASSIUM: 4.7 meq/L (ref 3.7–5.3)
Potassium: 4.5 mEq/L (ref 3.7–5.3)
Potassium: 5 mEq/L (ref 3.7–5.3)
Potassium: 5.4 mEq/L — ABNORMAL HIGH (ref 3.7–5.3)
SODIUM: 137 meq/L (ref 137–147)
SODIUM: 140 meq/L (ref 137–147)
Sodium: 136 mEq/L — ABNORMAL LOW (ref 137–147)
Sodium: 135 mEq/L — ABNORMAL LOW (ref 137–147)

## 2014-02-13 LAB — GLUCOSE, CAPILLARY
GLUCOSE-CAPILLARY: 143 mg/dL — AB (ref 70–99)
GLUCOSE-CAPILLARY: 153 mg/dL — AB (ref 70–99)
GLUCOSE-CAPILLARY: 157 mg/dL — AB (ref 70–99)
GLUCOSE-CAPILLARY: 167 mg/dL — AB (ref 70–99)
GLUCOSE-CAPILLARY: 167 mg/dL — AB (ref 70–99)
GLUCOSE-CAPILLARY: 204 mg/dL — AB (ref 70–99)
GLUCOSE-CAPILLARY: 220 mg/dL — AB (ref 70–99)
GLUCOSE-CAPILLARY: 318 mg/dL — AB (ref 70–99)
Glucose-Capillary: 113 mg/dL — ABNORMAL HIGH (ref 70–99)
Glucose-Capillary: 126 mg/dL — ABNORMAL HIGH (ref 70–99)
Glucose-Capillary: 128 mg/dL — ABNORMAL HIGH (ref 70–99)
Glucose-Capillary: 150 mg/dL — ABNORMAL HIGH (ref 70–99)
Glucose-Capillary: 154 mg/dL — ABNORMAL HIGH (ref 70–99)
Glucose-Capillary: 166 mg/dL — ABNORMAL HIGH (ref 70–99)
Glucose-Capillary: 168 mg/dL — ABNORMAL HIGH (ref 70–99)
Glucose-Capillary: 171 mg/dL — ABNORMAL HIGH (ref 70–99)
Glucose-Capillary: 176 mg/dL — ABNORMAL HIGH (ref 70–99)
Glucose-Capillary: 192 mg/dL — ABNORMAL HIGH (ref 70–99)
Glucose-Capillary: 305 mg/dL — ABNORMAL HIGH (ref 70–99)

## 2014-02-13 LAB — PROTIME-INR
INR: 1.72 — AB (ref 0.00–1.49)
Prothrombin Time: 20.2 seconds — ABNORMAL HIGH (ref 11.6–15.2)

## 2014-02-13 LAB — T3, FREE: T3, Free: 2.4 pg/mL (ref 2.3–4.2)

## 2014-02-13 LAB — CBC
HEMATOCRIT: 31.5 % — AB (ref 36.0–46.0)
HEMOGLOBIN: 9.9 g/dL — AB (ref 12.0–15.0)
MCH: 27.4 pg (ref 26.0–34.0)
MCHC: 31.4 g/dL (ref 30.0–36.0)
MCV: 87.3 fL (ref 78.0–100.0)
Platelets: 144 10*3/uL — ABNORMAL LOW (ref 150–400)
RBC: 3.61 MIL/uL — ABNORMAL LOW (ref 3.87–5.11)
RDW: 16.9 % — ABNORMAL HIGH (ref 11.5–15.5)
WBC: 7.9 10*3/uL (ref 4.0–10.5)

## 2014-02-13 LAB — T4, FREE: Free T4: 0.79 ng/dL — ABNORMAL LOW (ref 0.80–1.80)

## 2014-02-13 LAB — URINE MICROSCOPIC-ADD ON

## 2014-02-13 LAB — TROPONIN I
Troponin I: <0.3 ng/mL (ref <0.30)
Troponin I: <0.3 ng/mL (ref <0.30)
Troponin I: <0.3 ng/mL (ref <0.30)

## 2014-02-13 LAB — MRSA PCR SCREENING: MRSA BY PCR: NEGATIVE

## 2014-02-13 LAB — LACTIC ACID, PLASMA: Lactic Acid, Venous: 2 mmol/L (ref 0.5–2.2)

## 2014-02-13 LAB — TSH: TSH: 3.57 u[IU]/mL (ref 0.350–4.500)

## 2014-02-13 LAB — PROCALCITONIN: Procalcitonin: 0.1 ng/mL

## 2014-02-13 MED ORDER — FUROSEMIDE 10 MG/ML IJ SOLN: 80.0000 mg | Freq: Two times a day (BID) | INTRAMUSCULAR | Status: DC

## 2014-02-13 MED ORDER — CETYLPYRIDINIUM CHLORIDE 0.05 % MT LIQD
7.0000 mL | Freq: Two times a day (BID) | OROMUCOSAL | Status: DC
  Administered 2014-02-13 – 2014-02-17 (×8): 7 mL via OROMUCOSAL

## 2014-02-13 MED ORDER — FUROSEMIDE 10 MG/ML IJ SOLN
80.0000 mg | Freq: Four times a day (QID) | INTRAMUSCULAR | Status: DC
  Administered 2014-02-13 – 2014-02-18 (×18): 80 mg via INTRAVENOUS
  Filled 2014-02-13 (×24): qty 8

## 2014-02-13 MED ORDER — METOPROLOL TARTRATE 1 MG/ML IV SOLN: 2.5000 mg | Freq: Four times a day (QID) | INTRAVENOUS | Status: DC

## 2014-02-13 MED ORDER — CARBIDOPA-LEVODOPA ER 25-100 MG PO TBCR
1.0000 | EXTENDED_RELEASE_TABLET | Freq: Once | ORAL | Status: AC
  Administered 2014-02-13: 1 via ORAL
  Filled 2014-02-13: qty 1

## 2014-02-13 MED ORDER — DARBEPOETIN ALFA-POLYSORBATE 60 MCG/0.3ML IJ SOLN
60.0000 ug | INTRAMUSCULAR | Status: DC
  Administered 2014-02-14: 60 ug via SUBCUTANEOUS
  Filled 2014-02-13 (×2): qty 0.3

## 2014-02-13 MED ORDER — INSULIN ASPART 100 UNIT/ML ~~LOC~~ SOLN
0.0000 [IU] | Freq: Three times a day (TID) | SUBCUTANEOUS | Status: DC
  Administered 2014-02-14: 3 [IU] via SUBCUTANEOUS
  Administered 2014-02-14 (×2): 5 [IU] via SUBCUTANEOUS

## 2014-02-13 MED ORDER — INFLUENZA VAC SPLIT QUAD 0.5 ML IM SUSY
0.5000 mL | PREFILLED_SYRINGE | INTRAMUSCULAR | Status: AC
  Administered 2014-02-14: 0.5 mL via INTRAMUSCULAR
  Filled 2014-02-13: qty 0.5

## 2014-02-13 MED ORDER — CARVEDILOL 12.5 MG PO TABS
12.5000 mg | ORAL_TABLET | Freq: Two times a day (BID) | ORAL | Status: DC
  Filled 2014-02-13 (×2): qty 1

## 2014-02-13 MED ORDER — ROPINIROLE HCL 0.5 MG PO TABS
0.5000 mg | ORAL_TABLET | Freq: Three times a day (TID) | ORAL | Status: DC
  Administered 2014-02-13 – 2014-02-19 (×19): 0.5 mg via ORAL
  Filled 2014-02-13 (×25): qty 1

## 2014-02-13 MED ORDER — INSULIN ASPART 100 UNIT/ML ~~LOC~~ SOLN
0.0000 [IU] | Freq: Every day | SUBCUTANEOUS | Status: DC
  Administered 2014-02-13: 2 [IU] via SUBCUTANEOUS

## 2014-02-13 MED ORDER — METOPROLOL TARTRATE 1 MG/ML IV SOLN
2.5000 mg | Freq: Four times a day (QID) | INTRAVENOUS | Status: DC
  Administered 2014-02-14 (×2): 2.5 mg via INTRAVENOUS
  Filled 2014-02-13 (×6): qty 5

## 2014-02-13 MED ORDER — ONDANSETRON HCL 4 MG/2ML IJ SOLN
4.0000 mg | Freq: Four times a day (QID) | INTRAMUSCULAR | Status: DC | PRN
  Administered 2014-02-13: 4 mg via INTRAVENOUS
  Filled 2014-02-13: qty 2

## 2014-02-13 MED ORDER — INSULIN GLARGINE 100 UNIT/ML ~~LOC~~ SOLN
20.0000 [IU] | Freq: Every day | SUBCUTANEOUS | Status: DC
  Administered 2014-02-13 – 2014-02-16 (×4): 20 [IU] via SUBCUTANEOUS
  Filled 2014-02-13 (×6): qty 0.2

## 2014-02-13 MED ORDER — WARFARIN SODIUM 10 MG PO TABS
10.0000 mg | ORAL_TABLET | Freq: Once | ORAL | Status: AC
  Administered 2014-02-13: 10 mg via ORAL
  Filled 2014-02-13: qty 1

## 2014-02-13 MED ORDER — INSULIN GLARGINE 100 UNIT/ML ~~LOC~~ SOLN: 20.0000 [IU] | Freq: Every day | SUBCUTANEOUS | Status: DC

## 2014-02-13 NOTE — Consult Note (Signed)
[facility] Renal Consultation Note  Requesting MD: Conley Canal Indication for Consultation: CKD and volume overload  HPI:  [name] is a 73 y.o. female with PMhx significant for DM, HTN, fibromyalgia, obesity, OSA, CAD with preserved EF but elevated right sided pressures.  She also has CKD with a creatinine in the 2's over the last year.  She presented to the hospital on 9/10 with complaints of SOB and CP.  In the ER was found to be in Afib with a rapid rate- imaging showed a right sided pleural effusion with consolidation.  Troponins have been negative. She was placed on lasix 80 IV BID but not much UOP- regarding her A fib she has required esmolol/cardizem drips but nothing currently.  Blood pressure is soft.  She reports feeling better since admission, her HR is better. She tells me that she saw Dr. Hinda Lenis once as OP but did not like him so did not go back.  She was staying in a NH- just left last week and has ended up here.  "I know I dont need to be at home by myself"  "I dont want to go on no kidney machine"  Creatinine, Ser  Date/Time Value Ref Range Status  02/13/2014 11:38 AM 2.12* 0.50 - 1.10 mg/dL Final  02/13/2014  5:50 AM 2.08* 0.50 - 1.10 mg/dL Final  02/13/2014  3:00 AM 2.03* 0.50 - 1.10 mg/dL Final  02/13/2014 12:30 AM 2.02* 0.50 - 1.10 mg/dL Final  02/12/2014  5:15 PM 2.01* 0.50 - 1.10 mg/dL Final  12/31/2013  9:55 AM 2.44* 0.50 - 1.10 mg/dL Final  12/31/2013  8:22 AM 2.32* 0.50 - 1.10 mg/dL Final  12/30/2013  4:20 AM 2.75* 0.50 - 1.10 mg/dL Final  12/28/2013  7:25 AM 2.62* 0.50 - 1.10 mg/dL Final  12/27/2013  2:36 AM 2.31* 0.50 - 1.10 mg/dL Final  12/27/2013 12:37 AM 2.23* 0.50 - 1.10 mg/dL Final  12/26/2013 10:02 PM 2.27* 0.50 - 1.10 mg/dL Final  12/26/2013  8:35 PM 2.28* 0.50 - 1.10 mg/dL Final  12/26/2013  6:31 PM 2.23* 0.50 - 1.10 mg/dL Final  12/26/2013 12:54 PM 2.22* 0.50 - 1.10 mg/dL Final  11/05/2013  3:15 AM 2.77* 0.50 - 1.10 mg/dL Final  11/04/2013  5:43 AM 2.92*  0.50 - 1.10 mg/dL Final  11/03/2013  5:30 AM 2.90* 0.50 - 1.10 mg/dL Final  11/02/2013  4:55 AM 2.66* 0.50 - 1.10 mg/dL Final  11/01/2013  5:39 AM 2.60* 0.50 - 1.10 mg/dL Final  10/31/2013  4:50 AM 2.29* 0.50 - 1.10 mg/dL Final  10/30/2013  8:10 PM 2.45* 0.50 - 1.10 mg/dL Final  10/07/2013  6:00 AM 2.53* 0.50 - 1.10 mg/dL Final  10/06/2013  5:33 AM 2.66* 0.50 - 1.10 mg/dL Final  10/05/2013  5:07 AM 2.62* 0.50 - 1.10 mg/dL Final  10/04/2013  3:22 PM 2.60* 0.50 - 1.10 mg/dL Final  10/03/2013  5:45 AM 2.46* 0.50 - 1.10 mg/dL Final  10/02/2013  9:38 PM 2.23* 0.50 - 1.10 mg/dL Final  10/02/2013  9:05 AM 2.33* 0.50 - 1.10 mg/dL Final  07/03/2013  4:57 AM 2.93* 0.50 - 1.10 mg/dL Final  07/02/2013  5:01 AM 2.66* 0.50 - 1.10 mg/dL Final  07/01/2013  5:54 AM 2.60* 0.50 - 1.10 mg/dL Final  07/01/2013  5:32 AM 2.68* 0.50 - 1.10 mg/dL Final  04/18/2013  5:06 AM 2.56* 0.50 - 1.10 mg/dL Final  04/17/2013  2:24 PM 2.50* 0.50 - 1.10 mg/dL Final  04/13/2013  1:25 PM 2.32* 0.50 -  1.10 mg/dL Final  04/11/2013  7:35 PM 2.15* 0.50 - 1.10 mg/dL Final     DELTA CHECK NOTED  04/11/2013  6:15 PM PATIENT IDENTIFICATION ERROR. PLEASE DISREGARD RESULTS. ACCOUNT WILL BE CREDITED.  0.50 - 1.10 mg/dL Corrected     CORRECTED ON 11/10 AT 1129: PREVIOUSLY REPORTED AS 1.00  04/09/2013 10:30 AM 1.98* 0.50 - 1.10 mg/dL Final  11/05/2012  5:40 AM 2.54* 0.50 - 1.10 mg/dL Final  11/04/2012  5:23 AM 2.87* 0.50 - 1.10 mg/dL Final  11/03/2012  5:18 AM 3.16* 0.50 - 1.10 mg/dL Final  11/02/2012  3:55 AM 3.52* 0.50 - 1.10 mg/dL Final  11/01/2012 10:52 AM 3.55* 0.50 - 1.10 mg/dL Final  10/10/2012  5:47 AM 2.53* 0.50 - 1.10 mg/dL Final  10/09/2012  6:06 AM 2.62* 0.50 - 1.10 mg/dL Final  10/08/2012  5:33 AM 2.95* 0.50 - 1.10 mg/dL Final  10/07/2012  5:14 AM 2.88* 0.50 - 1.10 mg/dL Final  10/06/2012  8:57 PM 3.15* 0.50 - 1.10 mg/dL Final  10/06/2012 12:02 PM 3.11* 0.50 - 1.10 mg/dL Final  06/29/2012  6:25 AM 1.74* 0.50 - 1.10 mg/dL Final  06/28/2012  5:30 AM 1.91* 0.50 - 1.10  mg/dL Final     PMHx:   Past Medical History  Diagnosis Date  . Diabetes mellitus   . Hypertension   . Hyperlipemia   . Fibromyalgia   . Obesity   . Sleep apnea     No CPAP  . Anxiety   . Hiatal hernia   . NSTEMI (non-ST elevated myocardial infarction) 04/27/2011  . Anemia 04/27/2011  . Restless leg syndrome   . CAD (coronary artery disease)     small dominant right coronary artery with diffuse 95% stenosis. The LAD had 70% stenosis in the mid vessel beyond the diagonal branch but was a small vessel. Circumflex had proximal 30% stenosis.   Marland Kitchen External hemorrhoids   . Arrhythmia 10/11/2011  . Community acquired pneumonia 06/24/2012  . Renal insufficiency 05/06/2011  . Shingles   . CHF exacerbation 10/02/2013    Past Surgical History  Procedure Laterality Date  . Appendectomy    . Fracture surgery      left humerous  . Ganglion cyst removal      Family Hx:  Family History  Problem Relation Age of Onset  . Stomach cancer Mother   . Heart disease Maternal Grandmother   . Heart disease Maternal Grandfather   . Dementia Father     Social History:  reports that she has never smoked. She has never used smokeless tobacco. She reports that she does not drink alcohol or use illicit drugs. She was in NH , left last week most currently was at home alone- limited mobility  Allergies:  Allergies  Allergen Reactions  . Codeine Other (See Comments)    Childhood allergy    Medications: Prior to Admission medications   Medication Sig Start Date End Date Taking? Authorizing Provider  albuterol (PROVENTIL HFA;VENTOLIN HFA) 108 (90 BASE) MCG/ACT inhaler Inhale 2 puffs into the lungs every 6 (six) hours as needed for wheezing or shortness of breath.   Yes Historical Provider, MD  albuterol (PROVENTIL) (2.5 MG/3ML) 0.083% nebulizer solution Take 2.5 mg by nebulization 4 (four) times daily.    Yes Historical Provider, MD  aluminum & magnesium hydroxide-simethicone (MYLANTA) 500-450-40  MG/5ML suspension Take 15 mLs by mouth every 2 (two) hours as needed for indigestion.   Yes Historical Provider, MD  aspirin EC 81 MG tablet Take 81  mg by mouth daily.   Yes Historical Provider, MD  atorvastatin (LIPITOR) 40 MG tablet Take 40 mg by mouth daily.   Yes Historical Provider, MD  Calcium 500 MG tablet Take 500 mg by mouth 3 (three) times daily.   Yes Historical Provider, MD  calcium-vitamin D (OSCAL WITH D) 500-200 MG-UNIT per tablet Take 1 tablet by mouth 3 (three) times daily.   Yes Historical Provider, MD  carvedilol (COREG) 25 MG tablet Take 1 tablet (25 mg total) by mouth 2 (two) times daily with a meal. 01/29/14  Yes Brittainy Simmons, PA-C  Cholecalciferol (VITAMIN D-3) 1000 UNITS CAPS Take 1 capsule by mouth daily.   Yes Historical Provider, MD  diphenhydrAMINE (BENADRYL) 25 mg capsule Take 25 mg by mouth every 4 (four) hours as needed for itching.   Yes Historical Provider, MD  docusate sodium (COLACE) 100 MG capsule Take 100 mg by mouth every other day.   Yes Historical Provider, MD  ferrous sulfate 325 (65 FE) MG tablet Take 325 mg by mouth daily with breakfast.    Yes Historical Provider, MD  Fluticasone-Salmeterol (ADVAIR) 250-50 MCG/DOSE AEPB Inhale 1 puff into the lungs 2 (two) times daily.   Yes Historical Provider, MD  furosemide (LASIX) 40 MG tablet Take 40 mg by mouth 2 (two) times daily.   Yes Historical Provider, MD  gabapentin (NEURONTIN) 100 MG capsule Take 100 mg by mouth 3 (three) times daily.   Yes Historical Provider, MD  HYDROcodone-acetaminophen (NORCO/VICODIN) 5-325 MG per tablet Take 1 tablet by mouth every 6 (six) hours as needed for moderate pain.   Yes Historical Provider, MD  insulin aspart (NOVOLOG) 100 UNIT/ML injection Inject 5 Units into the skin 3 (three) times daily with meals. 11/05/13  Yes Janece Canterbury, MD  insulin glargine (LANTUS) 100 UNIT/ML injection Inject 0.2 mLs (20 Units total) into the skin at bedtime. 11/05/13  Yes Janece Canterbury, MD   nitroGLYCERIN (NITROSTAT) 0.4 MG SL tablet Place 1 mg under the tongue every 5 (five) minutes x 3 doses as needed for chest pain. For chest pain   Yes Historical Provider, MD  Omega-3 Fatty Acids (FISH OIL) 1000 MG CAPS Take 1 capsule by mouth 3 (three) times daily.   Yes Historical Provider, MD  traMADol (ULTRAM) 50 MG tablet Take 50 mg by mouth every 6 (six) hours as needed for moderate pain.   Yes Historical Provider, MD  vitamin B-12 (CYANOCOBALAMIN) 500 MCG tablet Take 500 mcg by mouth daily.   Yes Historical Provider, MD  warfarin (COUMADIN) 5 MG tablet Take 7.5 mg by mouth daily.   Yes Historical Provider, MD    I have reviewed the patient's current medications.  Labs:  Results for orders placed during the hospital encounter of 02/12/14 (from the past 48 hour(s))  PRO B NATRIURETIC PEPTIDE     Status: Abnormal   Collection Time    02/12/14  3:18 PM      Result Value Ref Range   Pro B Natriuretic peptide (BNP) 5454.0 (*) 0 - 125 pg/mL  CBC     Status: Abnormal   Collection Time    02/12/14  5:15 PM      Result Value Ref Range   WBC 8.1  4.0 - 10.5 K/uL   RBC 3.97  3.87 - 5.11 MIL/uL   Hemoglobin 11.1 (*) 12.0 - 15.0 g/dL   HCT 34.9 (*) 36.0 - 46.0 %   MCV 87.9  78.0 - 100.0 fL   MCH 28.0  26.0 - 34.0 pg   MCHC 31.8  30.0 - 36.0 g/dL   RDW 16.9 (*) 11.5 - 15.5 %   Platelets 185  150 - 400 K/uL  BASIC METABOLIC PANEL     Status: Abnormal   Collection Time    02/12/14  5:15 PM      Result Value Ref Range   Sodium 136 (*) 137 - 147 mEq/L   Potassium 4.9  3.7 - 5.3 mEq/L   Chloride 97  96 - 112 mEq/L   CO2 22  19 - 32 mEq/L   Glucose, Bld 443 (*) 70 - 99 mg/dL   BUN 49 (*) 6 - 23 mg/dL   Creatinine, Ser 2.01 (*) 0.50 - 1.10 mg/dL   Calcium 8.5  8.4 - 10.5 mg/dL   GFR calc non Af Amer 24 (*) >90 mL/min   GFR calc Af Amer 27 (*) >90 mL/min   Comment: (NOTE)     The eGFR has been calculated using the CKD EPI equation.     This calculation has not been validated in all  clinical situations.     eGFR's persistently <90 mL/min signify possible Chronic Kidney     Disease.   Anion gap 17 (*) 5 - 15  PROTIME-INR     Status: Abnormal   Collection Time    02/12/14  5:15 PM      Result Value Ref Range   Prothrombin Time 19.7 (*) 11.6 - 15.2 seconds   INR 1.67 (*) 0.00 - 1.49  MAGNESIUM     Status: None   Collection Time    02/12/14  5:26 PM      Result Value Ref Range   Magnesium 1.9  1.5 - 2.5 mg/dL  I-STAT CG4 LACTIC ACID, ED     Status: Abnormal   Collection Time    02/12/14  5:37 PM      Result Value Ref Range   Lactic Acid, Venous 2.39 (*) 0.5 - 2.2 mmol/L  I-STAT TROPOININ, ED     Status: None   Collection Time    02/12/14  5:48 PM      Result Value Ref Range   Troponin i, poc 0.00  0.00 - 0.08 ng/mL   Comment 3            Comment: Due to the release kinetics of cTnI,     a negative result within the first hours     of the onset of symptoms does not rule out     myocardial infarction with certainty.     If myocardial infarction is still suspected,     repeat the test at appropriate intervals.  CBG MONITORING, ED     Status: Abnormal   Collection Time    02/12/14  7:42 PM      Result Value Ref Range   Glucose-Capillary 427 (*) 70 - 99 mg/dL  CBG MONITORING, ED     Status: Abnormal   Collection Time    02/12/14  8:31 PM      Result Value Ref Range   Glucose-Capillary 353 (*) 70 - 99 mg/dL   Comment 1 Notify RN    CBG MONITORING, ED     Status: Abnormal   Collection Time    02/12/14 10:41 PM      Result Value Ref Range   Glucose-Capillary 331 (*) 70 - 99 mg/dL   Comment 1 Notify RN    GLUCOSE, CAPILLARY     Status: Abnormal  Collection Time    02/12/14 11:48 PM      Result Value Ref Range   Glucose-Capillary 305 (*) 70 - 99 mg/dL  TROPONIN I     Status: None   Collection Time    02/13/14 12:30 AM      Result Value Ref Range   Troponin I <0.30  <0.30 ng/mL   Comment:            Due to the release kinetics of cTnI,     a  negative result within the first hours     of the onset of symptoms does not rule out     myocardial infarction with certainty.     If myocardial infarction is still suspected,     repeat the test at appropriate intervals.  TSH     Status: None   Collection Time    02/13/14 12:30 AM      Result Value Ref Range   TSH 3.570  0.350 - 4.500 uIU/mL  T4, FREE     Status: Abnormal   Collection Time    02/13/14 12:30 AM      Result Value Ref Range   Free T4 0.79 (*) 0.80 - 1.80 ng/dL   Comment: Performed at [facility]  T3, FREE     Status: None   Collection Time    02/13/14 12:30 AM      Result Value Ref Range   T3, Free 2.4  2.3 - 4.2 pg/mL   Comment: Performed at [place] PANEL     Status: Abnormal   Collection Time    02/13/14 12:30 AM      Result Value Ref Range   Total Protein 6.8  6.0 - 8.3 g/dL   Albumin 2.8 (*) 3.5 - 5.2 g/dL   AST 20  0 - 37 U/L   ALT 16  0 - 35 U/L   Alkaline Phosphatase 73  39 - 117 U/L   Total Bilirubin 0.4  0.3 - 1.2 mg/dL   Bilirubin, Direct <0.2  0.0 - 0.3 mg/dL   Indirect Bilirubin NOT CALCULATED  0.3 - 0.9 mg/dL  BASIC METABOLIC PANEL     Status: Abnormal   Collection Time    02/13/14 12:30 AM      Result Value Ref Range   Sodium 136 (*) 137 - 147 mEq/L   Potassium 4.7  3.7 - 5.3 mEq/L   Chloride 100  96 - 112 mEq/L   CO2 20  19 - 32 mEq/L   Glucose, Bld 319 (*) 70 - 99 mg/dL   BUN 49 (*) 6 - 23 mg/dL   Creatinine, Ser 2.02 (*) 0.50 - 1.10 mg/dL   Calcium 8.6  8.4 - 10.5 mg/dL   GFR calc non Af Amer 23 (*) >90 mL/min   GFR calc Af Amer 27 (*) >90 mL/min   Comment: (NOTE)     The eGFR has been calculated using the CKD EPI equation.     This calculation has not been validated in all clinical situations.     eGFR's persistently <90 mL/min signify possible Chronic Kidney     Disease.   Anion gap 16 (*) 5 - 15  GLUCOSE, CAPILLARY     Status: Abnormal   Collection Time    02/13/14 12:51 AM      Result  Value Ref Range   Glucose-Capillary 318 (*) 70 - 99 mg/dL  MRSA PCR SCREENING     Status:  None   Collection Time    02/13/14  1:20 AM      Result Value Ref Range   MRSA by PCR NEGATIVE  NEGATIVE   Comment:            The GeneXpert MRSA Assay (FDA     approved for NASAL specimens     only), is one component of a     comprehensive MRSA colonization     surveillance program. It is not     intended to diagnose MRSA     infection nor to guide or     monitor treatment for     MRSA infections.  GLUCOSE, CAPILLARY     Status: Abnormal   Collection Time    02/13/14  1:49 AM      Result Value Ref Range   Glucose-Capillary 220 (*) 70 - 99 mg/dL  GLUCOSE, CAPILLARY     Status: Abnormal   Collection Time    02/13/14  2:49 AM      Result Value Ref Range   Glucose-Capillary 192 (*) 70 - 99 mg/dL  TROPONIN I     Status: None   Collection Time    02/13/14  3:00 AM      Result Value Ref Range   Troponin I <0.30  <0.30 ng/mL   Comment:            Due to the release kinetics of cTnI,     a negative result within the first hours     of the onset of symptoms does not rule out     myocardial infarction with certainty.     If myocardial infarction is still suspected,     repeat the test at appropriate intervals.  BASIC METABOLIC PANEL     Status: Abnormal   Collection Time    02/13/14  3:00 AM      Result Value Ref Range   Sodium 137  137 - 147 mEq/L   Potassium 4.5  3.7 - 5.3 mEq/L   Chloride 101  96 - 112 mEq/L   CO2 22  19 - 32 mEq/L   Glucose, Bld 204 (*) 70 - 99 mg/dL   BUN 49 (*) 6 - 23 mg/dL   Creatinine, Ser 2.03 (*) 0.50 - 1.10 mg/dL   Calcium 8.2 (*) 8.4 - 10.5 mg/dL   GFR calc non Af Amer 23 (*) >90 mL/min   GFR calc Af Amer 27 (*) >90 mL/min   Comment: (NOTE)     The eGFR has been calculated using the CKD EPI equation.     This calculation has not been validated in all clinical situations.     eGFR's persistently <90 mL/min signify possible Chronic Kidney     Disease.    Anion gap 14  5 - 15  CBC     Status: Abnormal   Collection Time    02/13/14  3:00 AM      Result Value Ref Range   WBC 7.9  4.0 - 10.5 K/uL   RBC 3.61 (*) 3.87 - 5.11 MIL/uL   Hemoglobin 9.9 (*) 12.0 - 15.0 g/dL   HCT 31.5 (*) 36.0 - 46.0 %   MCV 87.3  78.0 - 100.0 fL   MCH 27.4  26.0 - 34.0 pg   MCHC 31.4  30.0 - 36.0 g/dL   RDW 16.9 (*) 11.5 - 15.5 %   Platelets 144 (*) 150 - 400 K/uL  PROTIME-INR     Status: Abnormal  Collection Time    02/13/14  3:00 AM      Result Value Ref Range   Prothrombin Time 20.2 (*) 11.6 - 15.2 seconds   INR 1.72 (*) 0.00 - 1.49  LACTIC ACID, PLASMA     Status: None   Collection Time    02/13/14  3:05 AM      Result Value Ref Range   Lactic Acid, Venous 2.0  0.5 - 2.2 mmol/L  GLUCOSE, CAPILLARY     Status: Abnormal   Collection Time    02/13/14  3:53 AM      Result Value Ref Range   Glucose-Capillary 167 (*) 70 - 99 mg/dL  GLUCOSE, CAPILLARY     Status: Abnormal   Collection Time    02/13/14  4:50 AM      Result Value Ref Range   Glucose-Capillary 153 (*) 70 - 99 mg/dL  BASIC METABOLIC PANEL     Status: Abnormal   Collection Time    02/13/14  5:50 AM      Result Value Ref Range   Sodium 140  137 - 147 mEq/L   Potassium 5.0  3.7 - 5.3 mEq/L   Chloride 103  96 - 112 mEq/L   CO2 22  19 - 32 mEq/L   Glucose, Bld 144 (*) 70 - 99 mg/dL   BUN 48 (*) 6 - 23 mg/dL   Creatinine, Ser 2.08 (*) 0.50 - 1.10 mg/dL   Calcium 8.3 (*) 8.4 - 10.5 mg/dL   GFR calc non Af Amer 23 (*) >90 mL/min   GFR calc Af Amer 26 (*) >90 mL/min   Comment: (NOTE)     The eGFR has been calculated using the CKD EPI equation.     This calculation has not been validated in all clinical situations.     eGFR's persistently <90 mL/min signify possible Chronic Kidney     Disease.   Anion gap 15  5 - 15  GLUCOSE, CAPILLARY     Status: Abnormal   Collection Time    02/13/14  5:55 AM      Result Value Ref Range   Glucose-Capillary 154 (*) 70 - 99 mg/dL  GLUCOSE, CAPILLARY      Status: Abnormal   Collection Time    02/13/14  6:58 AM      Result Value Ref Range   Glucose-Capillary 126 (*) 70 - 99 mg/dL  GLUCOSE, CAPILLARY     Status: Abnormal   Collection Time    02/13/14  7:59 AM      Result Value Ref Range   Glucose-Capillary 113 (*) 70 - 99 mg/dL  GLUCOSE, CAPILLARY     Status: Abnormal   Collection Time    02/13/14  9:04 AM      Result Value Ref Range   Glucose-Capillary 128 (*) 70 - 99 mg/dL  GLUCOSE, CAPILLARY     Status: Abnormal   Collection Time    02/13/14 10:08 AM      Result Value Ref Range   Glucose-Capillary 150 (*) 70 - 99 mg/dL  GLUCOSE, CAPILLARY     Status: Abnormal   Collection Time    02/13/14 11:12 AM      Result Value Ref Range   Glucose-Capillary 168 (*) 70 - 99 mg/dL  BASIC METABOLIC PANEL     Status: Abnormal   Collection Time    02/13/14 11:38 AM      Result Value Ref Range   Sodium 135 (*) 137 - 147  mEq/L   Potassium 5.4 (*) 3.7 - 5.3 mEq/L   Chloride 99  96 - 112 mEq/L   CO2 23  19 - 32 mEq/L   Glucose, Bld 186 (*) 70 - 99 mg/dL   BUN 49 (*) 6 - 23 mg/dL   Creatinine, Ser 2.12 (*) 0.50 - 1.10 mg/dL   Calcium 8.7  8.4 - 10.5 mg/dL   GFR calc non Af Amer 22 (*) >90 mL/min   GFR calc Af Amer 26 (*) >90 mL/min   Comment: (NOTE)     The eGFR has been calculated using the CKD EPI equation.     This calculation has not been validated in all clinical situations.     eGFR's persistently <90 mL/min signify possible Chronic Kidney     Disease.   Anion gap 13  5 - 15  GLUCOSE, CAPILLARY     Status: Abnormal   Collection Time    02/13/14 12:16 PM      Result Value Ref Range   Glucose-Capillary 166 (*) 70 - 99 mg/dL   Comment 1 Notify RN     Comment 2 Documented in Chart    GLUCOSE, CAPILLARY     Status: Abnormal   Collection Time    02/13/14  1:21 PM      Result Value Ref Range   Glucose-Capillary 171 (*) 70 - 99 mg/dL     ROS:  A comprehensive review of systems was negative except for: Respiratory: positive  for cough and dyspnea on exertion Cardiovascular: positive for dyspnea and lower extremity edema  Physical Exam: Filed Vitals:   02/13/14 1100  BP: 92/72  Pulse: 41  Temp: 97.3 F (36.3 C)  Resp: 17     General: obese, pleasant- NAD HEENT: PERRLA, EOMI, mucous membranes moist Neck: positive for JVD Heart: irreg- rate is good Lungs: CBS bilat Abdomen: obese, soft, non tender, non distended Extremities: pitting edema Skin: warm and dry Neuro: non focal  Assessment/Plan: 73 year old WF with multiple medical issues including CKD (creatinine in the 2's for the last year)  presenting with volume overload and Afib with rapid rate 1.Renal- longstanding CKD present for at least the last year.  Urine's in the past have not been remarkable- at most 100 of protein.  Renal ultrasound unremarkable in the recent past as well.  Suspect cardiorenal syndrome for etiology of CKD- EF is OK but has elevated right sided pressures/obesity/OSA type picture.  There probably will not be reversibility and it will complicate management of other issues.  Also, with her debilitated state- needing to go into NH I agree with her that dialysis would likely worsen her quality of life and have told her that.  I would not consider her a good candidate for chronic HD 2. Hypertension/volume  - volume overloaded and with likely right sided heart failure.  Is on O2 at home.  Will diurese as able. I am going to increase lasix to 80 mg q 6 hours and follow. I have taken coreg down some to avoid low BP's  3. Anemia  - Likely related to CKD- will check iron stores and add aranesp 4. Bones- will check phos and PTH as well and treat as needed  5. ID- being started on ABX for lung consolidation   Xaniyah Buchholz A 02/13/2014, 1:39 PM

## 2014-02-13 NOTE — Progress Notes (Signed)
Utilization review completed. Hayes Rehfeldt, RN, BSN. 

## 2014-02-13 NOTE — Care Management Note (Addendum)
    Page 1 of 1   02/19/2014     12:11:53 PM CARE MANAGEMENT NOTE 02/19/2014  Patient:  JERENE, YEAGER   Account Number:  [id number]  Date Initiated:  02/13/2014  Documentation initiated by:  GRAVES-BIGELOW,BRENDA  Subjective/Objective Assessment:   Pt admitted for Afib RVR initiated on cardizem gtt.     Action/Plan:   Pt plans for SNF at d/c. CSW assisting with disposition needs.   Anticipated DC Date:  02/19/2014   Anticipated DC Plan:  SKILLED NURSING FACILITY  In-house referral  Clinical Social Worker      DC Planning Services  CM consult      Choice offered to / List presented to:             Status of service:  Completed, signed off Medicare Important Message given?  YES (If response is "NO", the following Medicare IM given date fields will be blank) Date Medicare IM given:  02/13/2014 Medicare IM given by:  GRAVES-BIGELOW,BRENDA Date Additional Medicare IM given:  02/19/2014 Additional Medicare IM given by:  Tomi Bamberger  Discharge Disposition:  [place]  Per UR Regulation:  Reviewed for med. necessity/level of care/duration of stay  If discussed at [place] of Stay Meetings, dates discussed:    Comments:

## 2014-02-13 NOTE — Clinical Social Work Note (Addendum)
Clinical Social Work Department CLINICAL SOCIAL WORK PLACEMENT NOTE 02/13/2014  Patient:  [name], [name]  Account Number:  [id number] Admit date:  02/12/2014  Clinical Social Worker:  Paulette Blanch BIBBS, LCSWA  Date/time:  02/13/2014 06:03 PM  Clinical Social Work is seeking post-discharge placement for this patient at the following level of care:   SKILLED NURSING   (*CSW will update this form in Epic as items are completed)   02/13/2014  Patient/family provided with [place] Department of Clinical Social Work's list of facilities offering this level of care within the geographic area requested by the patient (or if unable, by the patient's family).  02/13/2014  Patient/family informed of their freedom to choose among providers that offer the needed level of care, that participate in Medicare, Medicaid or managed care program needed by the patient, have an available bed and are willing to accept the patient.  02/13/2014  Patient/family informed of [facility]' ownership interest in [facility], as well as of the fact that they are under no obligation to receive care at this facility.  PASARR submitted to EDS on  PASARR number received on   FL2 transmitted to all facilities in geographic area requested by pt/family on  02/13/2014 FL2 transmitted to all facilities within larger geographic area on 02/13/2014  Patient informed that his/her managed care company has contracts with or will negotiate with  certain facilities, including the following:     Patient/family informed of bed offers received:  02/19/2014 Patient chooses bed at [facility] Physician recommends and patient chooses bed at    Patient to be transferred to [place] on  02/20/2014 Patient to be transferred to facility by [facility] Patient and family notified of transfer on 02/20/2014 Name of family member notified:  Pt declined  The following physician request were entered in  Epic:   Additional Comments: Pt has pervious existing PASSAR.   [place], MSW, [place]

## 2014-02-13 NOTE — Progress Notes (Signed)
Inpatient Diabetes Program Recommendations  AACE/ADA: New Consensus Statement on Inpatient Glycemic Control (2013)  Target Ranges:  Prepandial:   less than 140 mg/dL      Peak postprandial:   less than 180 mg/dL (1-2 hours)      Critically ill patients:  140 - 180 mg/dL   Reason for Assessment:  Note patient is on insulin drip due to widened AG and CBG>400 mg/dL on admit.   She has been living with son, but note plans for her to possible d/c to SNF. Diabetes history: Type 2 Outpatient Diabetes medications: Lantus 20 units daily, Novolog 5 units tid with meals Current orders for Inpatient glycemic control: Currently patient on insulin drip.  CBG's look much better.    Upon transition off insulin drip, consider Lantus 20 units 2 hours prior to d/c of insulin drip.  Once eating she will likely need Novolog meal coverage also.  Consider Novolog 4 units tid with meals and Novolog moderate correction q 4 hrs.   Will follow.  Thanks, Adah Perl, RN, BC-ADM Inpatient Diabetes Coordinator Pager [phone]

## 2014-02-13 NOTE — Progress Notes (Signed)
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 She was referred to the emergency department by her primary care physician after complaints of chest pain associated with shortness of breath since waking up on the morning of presentation. She also noticed that her blood sugars were higher than usual. Patient reported progressive weight gain over the past several months and this has been associated with worsening lower extremity edema. She denied fevers, chills or productive cough.  In the ER the patient was found to be in atrial fibrillation with RVR she also was hyperglycemic with a mildly elevated anion gap and was started on IV insulin for possible DKA. She has been given Lasix 40 mg IV for CHF/volume overload and started on esmolol infusion for the age of fibrillation with RVR. Chest x-ray demonstrated right-sided opacity and a CT of the chest was pending based on radiologist recommendation.  Since admission the CT the chest has been completed as consistent with a large right pleural effusion. Patient's heart rate has decreased so Cardizem drip has been discontinued in favor of carvedilol. Patient had no leukocytosis or fever presentation so empiric antibiotics have been discontinued since it is doubtful she has healthcare acquired pneumonia. After review of inpatient weights from June to current admission it was noted she has experienced at least a 20+ pound weight gain since June of this year. Her echocardiogram shows normal systolic function in the setting of mild to moderate pulmonary hypertension with moderate tricuspid regurgitation and increased right ventricular systolic pressure. It is suspected that her poor renal  function has contributed to chronic volume overload. Nephrology has been consulted. Her urinalysis was abnormal and the urine culture is pending  HPI/Subjective: Primarily complaining of restless leg syndrome as well as pain in the legs. Still short of breath but much improved as compared to prior to admission. Worried about going home. Wants snf  Assessment/Plan: Active Problems:   Atrial fibrillation with RVR HR still high, but blood pressure borderline. Will d/c carvedilol. IV metoprolol 2.5 mg q6h as blood pressure tolerates. Coumadin per pharmacy    Acute on chronic respiratory failure with hypoxia/Volume overload/ Pulmonary HTN/ ?? OSA Massively fluid overloaded. [place] nephrology. Diurese as blood pressure tolerates    Chronic kidney disease (CKD), stage V/Hyperkalemia -Appreciate nephrology assistance-they suspect cardiorenal syndrome as etiology for chronic kidney disease in setting of elevated right-sided pressure and likely obesity hypoventilation/sleep apnea picture with pulmonary hypertension-likely not a reversible issue in regards to her renal function in the nephrologist discussed with her that dialysis would worsen her quality of life and therefore not a good candidate for chronic hemodialysis-nephrology has increased Lasix to 80 mg IV every 6 hours and have adjusted down the carvedilol to minimize low blood pressure and maximize diuresis    DM type 2, uncontrolled, with neuropathy/DKA  -Suspect elevated anion gap related to renal function and not true DKA-will transition from insulin drip to home Lantus-SSI if needed    Morbid obesity    Restless leg -Probably more reflective of underlying diabetic neuropathy as well as pain secondary to significant lower extremity edema-add low-dose Requip to see if helpful    Essential (primary) hypertension BPs borderline    Abnormal urinalysis i dont see ua. Will  order.  Debility: pt eval. Reportedly has used up all SNF  days.  DVT prophylaxis: On Coumadin but INR subtherapeutic so we'll place SCDs until INR therapeutic Code Status: Full Family Communication: son  Disposition Plan/Expected LOS: Step down   Consultants: Nephrology  Procedures: None  Cultures: 9/11 urine culture pending  Antibiotics: Cefepime 9/10 >>> 9/11 Vancomycin 9/10 >>> 9/11  Objective: Blood pressure 92/72, pulse 41, temperature 97.3 F (36.3 C), temperature source Oral, resp. rate 17, height 5\' 3"  (1.6 m), weight 278 lb (126.1 kg), SpO2 95.00%.  Intake/Output Summary (Last 24 hours) at 02/13/14 1555 Last data filed at 02/13/14 1245  Gross per 24 hour  Intake 1526.09 ml  Output    675 ml  Net 851.09 ml     Exam: Gen: No acute respiratory distress at rest Chest: Coarse auscultation with diffuse crackles posteriorly, 2 L Cardiac: Regular rate and rhythm, S1-S2, no rubs murmurs or gallops, marked peripheral edema most notable in lower extremities with 2-3+ tense edema, no definitive JVD Abdomen: Soft nontender some distended consistent with edema/anasarca but without obvious hepatosplenomegaly; no ascites Extremities: Symmetrical in appearance without cyanosis, clubbing or effusion  Scheduled Meds:  Scheduled Meds: . antiseptic oral rinse  7 mL Mouth Rinse BID  . aspirin EC  81 mg Oral Daily  . atorvastatin  40 mg Oral QPM  . calcium carbonate  1 tablet Oral TID WC  . carvedilol  12.5 mg Oral BID WC  . vitamin B-12  500 mcg Oral Daily  . [START ON 02/14/2014] darbepoetin (ARANESP) injection - NON-DIALYSIS  60 mcg Subcutaneous Q Sat-1800  . [START ON 02/14/2014] docusate sodium  100 mg Oral QODAY  . ferrous sulfate  325 mg Oral Q breakfast  . furosemide  80 mg Intravenous Q6H  . gabapentin  100 mg Oral TID  . [START ON 02/14/2014] Influenza vac split quadrivalent PF  0.5 mL Intramuscular Tomorrow-1000  . mometasone-formoterol  2 puff Inhalation BID  . omega-3 acid ethyl esters  1 g Oral TID  . rOPINIRole  0.5 mg  Oral TID  . warfarin  10 mg Oral ONCE-1800  . Warfarin - Pharmacist Dosing Inpatient   Does not apply q1800   Continuous Infusions: . sodium chloride Stopped (02/13/14 0200)  . dextrose 5 % and 0.45% NaCl 45 mL/hr at 02/13/14 0208  . insulin (NOVOLIN-R) infusion 3.8 Units/hr (02/13/14 0600)    Data Reviewed: Basic Metabolic Panel:  Recent Labs Lab 02/12/14 1715 02/12/14 1726 02/13/14 0030 02/13/14 0300 02/13/14 0550 02/13/14 1138  NA 136*  --  136* 137 140 135*  K 4.9  --  4.7 4.5 5.0 5.4*  CL 97  --  100 101 103 99  CO2 22  --  20 22 22 23   GLUCOSE 443*  --  319* 204* 144* 186*  BUN 49*  --  49* 49* 48* 49*  CREATININE 2.01*  --  2.02* 2.03* 2.08* 2.12*  CALCIUM 8.5  --  8.6 8.2* 8.3* 8.7  MG  --  1.9  --   --   --   --    Liver Function Tests:  Recent Labs Lab 02/13/14 0030  AST 20  ALT 16  ALKPHOS 73  BILITOT 0.4  PROT 6.8  ALBUMIN 2.8*   No results found for this basename: LIPASE, AMYLASE,  in the last 168 hours No results found for this basename: AMMONIA,  in the last 168 hours CBC:  Recent Labs Lab 02/12/14 1715 02/13/14 0300  WBC  8.1 7.9  HGB 11.1* 9.9*  HCT 34.9* 31.5*  MCV 87.9 87.3  PLT 185 144*   Cardiac Enzymes:  Recent Labs Lab 02/13/14 0030 02/13/14 0300 02/13/14 1111  TROPONINI <0.30 <0.30 <0.30   BNP (last 3 results)  Recent Labs  10/02/13 0905 10/30/13 2010 02/12/14 1518  PROBNP 6285.0* 4361.0* 5454.0*   CBG:  Recent Labs Lab 02/13/14 1008 02/13/14 1112 02/13/14 1216 02/13/14 1321 02/13/14 1426  GLUCAP 150* 168* 166* 171* 176*    Recent Results (from the past 240 hour(s))  MRSA PCR SCREENING     Status: None   Collection Time    02/13/14  1:20 AM      Result Value Ref Range Status   MRSA by PCR NEGATIVE  NEGATIVE Final   Comment:            The GeneXpert MRSA Assay (FDA     approved for NASAL specimens     only), is one component of a     comprehensive MRSA colonization     surveillance program. It is  not     intended to diagnose MRSA     infection nor to guide or     monitor treatment for     MRSA infections.     Studies:  Recent x-ray studies have been reviewed in detail by the Attending Physician  Time spent :      Erin Hearing, [place] [facility] Office  [phone] Pager [phone]   **If unable to reach the above provider after paging please contact the [place] @ [phone]  On-Call/Text Page:      [email or web address]      password [email or web address]  If 7PM-7AM, please contact night-coverage [email or web address] Password [email or web address] 02/13/2014, 3:55 PM   LOS: 1 day   Attending note: chart reviewed. Patient examined independently. Note amended.  Doree Barthel, MD [facility]

## 2014-02-14 DIAGNOSIS — I130 Hypertensive heart and chronic kidney disease with heart failure and stage 1 through stage 4 chronic kidney disease, or unspecified chronic kidney disease: Secondary | ICD-10-CM | POA: Diagnosis not present

## 2014-02-14 DIAGNOSIS — J9610 Chronic respiratory failure, unspecified whether with hypoxia or hypercapnia: Secondary | ICD-10-CM

## 2014-02-14 DIAGNOSIS — I2789 Other specified pulmonary heart diseases: Secondary | ICD-10-CM

## 2014-02-14 DIAGNOSIS — I509 Heart failure, unspecified: Secondary | ICD-10-CM | POA: Diagnosis not present

## 2014-02-14 DIAGNOSIS — R0902 Hypoxemia: Secondary | ICD-10-CM

## 2014-02-14 DIAGNOSIS — R82998 Other abnormal findings in urine: Secondary | ICD-10-CM

## 2014-02-14 LAB — RENAL FUNCTION PANEL
ANION GAP: 13 (ref 5–15)
Albumin: 3 g/dL — ABNORMAL LOW (ref 3.5–5.2)
BUN: 50 mg/dL — ABNORMAL HIGH (ref 6–23)
CALCIUM: 8.7 mg/dL (ref 8.4–10.5)
CHLORIDE: 100 meq/L (ref 96–112)
CO2: 21 meq/L (ref 19–32)
Creatinine, Ser: 2.26 mg/dL — ABNORMAL HIGH (ref 0.50–1.10)
GFR calc Af Amer: 24 mL/min — ABNORMAL LOW (ref >90)
GFR calc non Af Amer: 20 mL/min — ABNORMAL LOW (ref >90)
GLUCOSE: 183 mg/dL — AB (ref 70–99)
POTASSIUM: 4.9 meq/L (ref 3.7–5.3)
Phosphorus: 3.7 mg/dL (ref 2.3–4.6)
Sodium: 134 mEq/L — ABNORMAL LOW (ref 137–147)

## 2014-02-14 LAB — GLUCOSE, CAPILLARY
GLUCOSE-CAPILLARY: 173 mg/dL — AB (ref 70–99)
Glucose-Capillary: 163 mg/dL — ABNORMAL HIGH (ref 70–99)
Glucose-Capillary: 209 mg/dL — ABNORMAL HIGH (ref 70–99)
Glucose-Capillary: 230 mg/dL — ABNORMAL HIGH (ref 70–99)
Glucose-Capillary: 225 mg/dL — ABNORMAL HIGH (ref 70–99)
Glucose-Capillary: 174 mg/dL — ABNORMAL HIGH (ref 70–99)

## 2014-02-14 LAB — URINALYSIS, ROUTINE W REFLEX MICROSCOPIC
BILIRUBIN URINE: NEGATIVE
GLUCOSE, UA: NEGATIVE mg/dL
HGB URINE DIPSTICK: NEGATIVE
Ketones, ur: NEGATIVE mg/dL
Nitrite: NEGATIVE
PROTEIN: 100 mg/dL — AB
Specific Gravity, Urine: 1.012 (ref 1.005–1.030)
UROBILINOGEN UA: 0.2 mg/dL (ref 0.0–1.0)
pH: 5 (ref 5.0–8.0)

## 2014-02-14 LAB — FERRITIN: FERRITIN: 99 ng/mL (ref 10–291)

## 2014-02-14 LAB — CBC
HCT: 33.7 % — ABNORMAL LOW (ref 36.0–46.0)
HEMOGLOBIN: 10.5 g/dL — AB (ref 12.0–15.0)
MCH: 27.9 pg (ref 26.0–34.0)
MCHC: 31.2 g/dL (ref 30.0–36.0)
MCV: 89.4 fL (ref 78.0–100.0)
PLATELETS: 152 10*3/uL (ref 150–400)
RBC: 3.77 MIL/uL — ABNORMAL LOW (ref 3.87–5.11)
RDW: 17.4 % — ABNORMAL HIGH (ref 11.5–15.5)
WBC: 7.1 10*3/uL (ref 4.0–10.5)

## 2014-02-14 LAB — PROTIME-INR
INR: 1.8 — ABNORMAL HIGH (ref 0.00–1.49)
Prothrombin Time: 20.9 seconds — ABNORMAL HIGH (ref 11.6–15.2)

## 2014-02-14 LAB — URINE MICROSCOPIC-ADD ON

## 2014-02-14 LAB — IRON AND TIBC
Iron: 50 ug/dL (ref 42–135)
Saturation Ratios: 17 % — ABNORMAL LOW (ref 20–55)
TIBC: 292 ug/dL (ref 250–470)
UIBC: 242 ug/dL (ref 125–400)

## 2014-02-14 MED ORDER — METOPROLOL TARTRATE 25 MG PO TABS
25.0000 mg | ORAL_TABLET | Freq: Two times a day (BID) | ORAL | Status: DC
  Administered 2014-02-14 – 2014-02-19 (×12): 25 mg via ORAL
  Filled 2014-02-14 (×14): qty 1

## 2014-02-14 MED ORDER — INSULIN ASPART 100 UNIT/ML ~~LOC~~ SOLN
5.0000 [IU] | Freq: Three times a day (TID) | SUBCUTANEOUS | Status: DC
  Administered 2014-02-14 (×2): 5 [IU] via SUBCUTANEOUS

## 2014-02-14 MED ORDER — INSULIN ASPART 100 UNIT/ML ~~LOC~~ SOLN
0.0000 [IU] | Freq: Three times a day (TID) | SUBCUTANEOUS | Status: DC
  Administered 2014-02-15 – 2014-02-16 (×3): 3 [IU] via SUBCUTANEOUS
  Administered 2014-02-16 (×2): 2 [IU] via SUBCUTANEOUS
  Administered 2014-02-17: 3 [IU] via SUBCUTANEOUS
  Administered 2014-02-18: 2 [IU] via SUBCUTANEOUS
  Administered 2014-02-19: 3 [IU] via SUBCUTANEOUS
  Administered 2014-02-20: 2 [IU] via SUBCUTANEOUS

## 2014-02-14 MED ORDER — CALCIUM CARBONATE 1250 (500 CA) MG PO TABS
1.0000 | ORAL_TABLET | Freq: Three times a day (TID) | ORAL | Status: DC
  Administered 2014-02-15 – 2014-02-20 (×14): 500 mg via ORAL
  Filled 2014-02-14 (×19): qty 1

## 2014-02-14 MED ORDER — FERROUS SULFATE 325 (65 FE) MG PO TABS
325.0000 mg | ORAL_TABLET | Freq: Every day | ORAL | Status: DC
  Administered 2014-02-15 – 2014-02-20 (×6): 325 mg via ORAL
  Filled 2014-02-14 (×7): qty 1

## 2014-02-14 MED ORDER — INSULIN ASPART 100 UNIT/ML ~~LOC~~ SOLN
5.0000 [IU] | Freq: Three times a day (TID) | SUBCUTANEOUS | Status: DC
  Administered 2014-02-15 – 2014-02-20 (×14): 5 [IU] via SUBCUTANEOUS

## 2014-02-14 MED ORDER — WARFARIN SODIUM 10 MG PO TABS
10.0000 mg | ORAL_TABLET | Freq: Once | ORAL | Status: AC
  Administered 2014-02-14: 10 mg via ORAL
  Filled 2014-02-14: qty 1

## 2014-02-14 MED ORDER — NYSTATIN 100000 UNIT/GM EX POWD
Freq: Two times a day (BID) | CUTANEOUS | Status: DC
  Administered 2014-02-14 – 2014-02-15 (×4): via TOPICAL
  Administered 2014-02-16: 1 g via TOPICAL
  Administered 2014-02-16 – 2014-02-19 (×6): via TOPICAL
  Filled 2014-02-14 (×2): qty 15

## 2014-02-14 MED ORDER — CIPROFLOXACIN HCL 500 MG PO TABS
500.0000 mg | ORAL_TABLET | Freq: Every day | ORAL | Status: DC
  Administered 2014-02-14 – 2014-02-15 (×2): 500 mg via ORAL
  Filled 2014-02-14 (×2): qty 1

## 2014-02-14 NOTE — Progress Notes (Addendum)
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 She was referred to the emergency department by her primary care physician after complaints of chest pain associated with shortness of breath since waking up on the morning of presentation. She also noticed that her blood sugars were higher than usual. Patient reported progressive weight gain over the past several months and this has been associated with worsening lower extremity edema. She denied fevers, chills or productive cough.  In the ER the patient was found to be in atrial fibrillation with RVR she also was hyperglycemic with a mildly elevated anion gap and was started on IV insulin for possible DKA. She has been given Lasix 40 mg IV for CHF/volume overload and started on esmolol infusion for the age of fibrillation with RVR. Chest x-ray demonstrated right-sided opacity and a CT of the chest was pending based on radiologist recommendation.  Since admission the CT the chest has been completed as consistent with a large right pleural effusion. Patient's heart rate has decreased so Cardizem drip has been discontinued in favor of carvedilol. Patient had no leukocytosis or fever presentation so empiric antibiotics have been discontinued since it is doubtful she has healthcare acquired pneumonia. After review of inpatient weights from June to current admission it was noted she has experienced at least a 20+ pound weight gain since June of this year. Her echocardiogram shows normal systolic function in the setting of mild to moderate pulmonary hypertension with moderate tricuspid regurgitation and increased right ventricular systolic pressure. It is suspected that her poor renal  function has contributed to chronic volume overload. Nephrology has been consulted. Her urinalysis was abnormal and the urine culture is pending  HPI/Subjective: Primarily complaining of restless leg syndrome as well as pain in the legs. Still short of breath but much improved as compared to prior to admission. Worried about going home. Wants snf  Assessment/Plan: Active Problems:   Atrial fibrillation with RVR/chronic a fib HR better. Blood pressure not as low. Will give metoprolol 25 bid and titrate for HR and as blood pressure tolerates. Coumadin per pharmacy. D/c SCDs.    Acute on chronic respiratory failure with hypoxia/right heart failure/ Pulmonary HTN/ ?? OSA Massively fluid overloaded. [place] nephrology. Diurese as blood pressure tolerates    Chronic kidney disease (CKD), stage V/Hyperkalemia -Appreciate nephrology assistance. Not a good candidate for long term HD.     DM type 2, uncontrolled, with neuropathy CBGs better.  Pleural effusion chronic: diuresis should help.    Morbid obesity    Restless leg -Probably more reflective of underlying diabetic neuropathy as well as pain secondary to significant lower extremity edema-add low-dose Requip to see if helpful    Essential (primary) hypertension BPs not as low.    Abnormal urinalysis Many squamous epis. Will repeat. Nephrology started cipro.  Debility: pt eval. Reportedly has used up all SNF days.  DVT prophylaxis: coumadin Code Status: Full Family Communication: son  Disposition Plan/Expected LOS: transfer to tele. Needs continued diuresis.    Consultants: Nephrology  Procedures: None  Cultures: 9/11 urine culture pending  Antibiotics: Cefepime 9/10 >>> 9/11 Vancomycin 9/10 >>> 9/11  Objective: Blood pressure 116/92, pulse 113, temperature 97.9 F (36.6 C), temperature source Oral, resp. rate 15,  height 5\' 3"  (1.6 m), weight 126 kg (277 lb 12.5 oz), SpO2 96.00%.  Intake/Output Summary (Last 24  hours) at 02/14/14 0941 Last data filed at 02/14/14 0835  Gross per 24 hour  Intake      0 ml  Output    350 ml  Net   -350 ml   Tele: a fib. Rate 80 - 100  Exam: Gen: alert, oriented. comfortable Chest: CTA without WRR Cardiac: irreg irreg. Without MGR Abdomen: obese, s, nt, nd Extremities: edema 2+. No CC  Scheduled Meds:  Scheduled Meds: . antiseptic oral rinse  7 mL Mouth Rinse BID  . aspirin EC  81 mg Oral Daily  . atorvastatin  40 mg Oral QPM  . calcium carbonate  1 tablet Oral TID WC  . ciprofloxacin  500 mg Oral Daily  . vitamin B-12  500 mcg Oral Daily  . darbepoetin (ARANESP) injection - NON-DIALYSIS  60 mcg Subcutaneous Q Sat-1800  . docusate sodium  100 mg Oral QODAY  . ferrous sulfate  325 mg Oral Q breakfast  . furosemide  80 mg Intravenous Q6H  . gabapentin  100 mg Oral TID  . Influenza vac split quadrivalent PF  0.5 mL Intramuscular Tomorrow-1000  . insulin aspart  0-15 Units Subcutaneous TID WC  . insulin aspart  0-5 Units Subcutaneous QHS  . insulin glargine  20 Units Subcutaneous QHS  . metoprolol tartrate  25 mg Oral BID  . mometasone-formoterol  2 puff Inhalation BID  . omega-3 acid ethyl esters  1 g Oral TID  . rOPINIRole  0.5 mg Oral TID  . warfarin  10 mg Oral ONCE-1800  . Warfarin - Pharmacist Dosing Inpatient   Does not apply q1800   Continuous Infusions:    Data Reviewed: Basic Metabolic Panel:  Recent Labs Lab 02/12/14 1726 02/13/14 0030 02/13/14 0300 02/13/14 0550 02/13/14 1138 02/14/14 0438  NA  --  136* 137 140 135* 134*  K  --  4.7 4.5 5.0 5.4* 4.9  CL  --  100 101 103 99 100  CO2  --  20 22 22 23 21   GLUCOSE  --  319* 204* 144* 186* 183*  BUN  --  49* 49* 48* 49* 50*  CREATININE  --  2.02* 2.03* 2.08* 2.12* 2.26*  CALCIUM  --  8.6 8.2* 8.3* 8.7 8.7  MG 1.9  --   --   --   --   --   PHOS  --   --   --   --   --  3.7   Liver Function Tests:  Recent Labs Lab 02/13/14 0030 02/14/14 0438  AST 20  --   ALT 16  --     ALKPHOS 73  --   BILITOT 0.4  --   PROT 6.8  --   ALBUMIN 2.8* 3.0*   No results found for this basename: LIPASE, AMYLASE,  in the last 168 hours No results found for this basename: AMMONIA,  in the last 168 hours CBC:  Recent Labs Lab 02/12/14 1715 02/13/14 0300 02/14/14 0438  WBC 8.1 7.9 7.1  HGB 11.1* 9.9* 10.5*  HCT 34.9* 31.5* 33.7*  MCV 87.9 87.3 89.4  PLT 185 144* 152   Cardiac Enzymes:  Recent Labs Lab 02/13/14 0030 02/13/14 0300 02/13/14 1111  TROPONINI <0.30 <0.30 <0.30   BNP (last 3 results)  Recent Labs  10/02/13 0905 10/30/13 2010 02/12/14 1518  PROBNP 6285.0* 4361.0* 5454.0*   CBG:  Recent Labs  Lab 02/13/14 1635 02/13/14 1748 02/13/14 2144 02/14/14 0633 02/14/14 0756  GLUCAP 167* 143* 204* 163* 173*    Recent Results (from the past 240 hour(s))  MRSA PCR SCREENING     Status: None   Collection Time    02/13/14  1:20 AM      Result Value Ref Range Status   MRSA by PCR NEGATIVE  NEGATIVE Final   Comment:            The GeneXpert MRSA Assay (FDA     approved for NASAL specimens     only), is one component of a     comprehensive MRSA colonization     surveillance program. It is not     intended to diagnose MRSA     infection nor to guide or     monitor treatment for     MRSA infections.     Studies:   Time spent : 27 min  Doree Barthel, MD [facility] [phone]  If 7PM-7AM, please contact night-coverage [email or web address] Password [email or web address] 02/14/2014, 9:41 AM   LOS: 2 days

## 2014-02-14 NOTE — Discharge Instructions (Signed)
Information on my medicine - Coumadin®   (Warfarin) ° °This medication education was reviewed with me or my healthcare representative as part of my discharge preparation.  The pharmacist that spoke with me during my hospital stay was:   ° °Why was Coumadin prescribed for you? °Coumadin was prescribed for you because you have a blood clot or a medical condition that can cause an increased risk of forming blood clots. Blood clots can cause serious health problems by blocking the flow of blood to the heart, lung, or brain. Coumadin can prevent harmful blood clots from forming. °As a reminder your indication for Coumadin is:   Stroke Prevention Because Of Atrial Fibrillation ° °What test will check on my response to Coumadin? °While on Coumadin (warfarin) you will need to have an INR test regularly to ensure that your dose is keeping you in the desired range. The INR (international normalized ratio) number is calculated from the result of the laboratory test called prothrombin time (PT). ° °If an INR APPOINTMENT HAS NOT ALREADY BEEN MADE FOR YOU please schedule an appointment to have this lab work done by your health care provider within 7 days. °Your INR goal is usually a number between:  2 to 3 or your provider may give you a more narrow range like 2-2.5.  Ask your health care provider during an office visit what your goal INR is. ° °What  do you need to  know  About  COUMADIN? °Take Coumadin (warfarin) exactly as prescribed by your healthcare provider about the same time each day.  DO NOT stop taking without talking to the doctor who prescribed the medication.  Stopping without other blood clot prevention medication to take the place of Coumadin may increase your risk of developing a new clot or stroke.  Get refills before you run out. ° °What do you do if you miss a dose? °If you miss a dose, take it as soon as you remember on the same day then continue your regularly scheduled regimen the next day.  Do not take two  doses of Coumadin at the same time. ° °Important Safety Information °A possible side effect of Coumadin (Warfarin) is an increased risk of bleeding. You should call your healthcare provider right away if you experience any of the following: °? Bleeding from an injury or your nose that does not stop. °? Unusual colored urine (red or dark brown) or unusual colored stools (red or black). °? Unusual bruising for unknown reasons. °? A serious fall or if you hit your head (even if there is no bleeding). ° °Some foods or medicines interact with Coumadin® (warfarin) and might alter your response to warfarin. To help avoid this: °? Eat a balanced diet, maintaining a consistent amount of Vitamin K. °? Notify your provider about major diet changes you plan to make. °? Avoid alcohol or limit your intake to 1 drink for women and 2 drinks for men per day. °(1 drink is 5 oz. wine, 12 oz. beer, or 1.5 oz. liquor.) ° °Make sure that ANY health care provider who prescribes medication for you knows that you are taking Coumadin (warfarin).  Also make sure the healthcare provider who is monitoring your Coumadin knows when you have started a new medication including herbals and non-prescription products. ° °Coumadin® (Warfarin)  Major Drug Interactions  °Increased Warfarin Effect Decreased Warfarin Effect  °Alcohol (large quantities) °Antibiotics (esp. Septra/Bactrim, Flagyl, Cipro) °Amiodarone (Cordarone) °Aspirin (ASA) °Cimetidine (Tagamet) °Megestrol (Megace) °NSAIDs (ibuprofen, naproxen, etc.) °Piroxicam (  Megestrol (Megace) NSAIDs (ibuprofen, naproxen, etc.) Piroxicam (Feldene) Propafenone (Rythmol SR) Propranolol (Inderal) Isoniazid (INH) Posaconazole (Noxafil) Barbiturates (Phenobarbital) Carbamazepine (Tegretol) Chlordiazepoxide (Librium) Cholestyramine (Questran) Griseofulvin Oral Contraceptives Rifampin Sucralfate (Carafate) Vitamin K   Coumadin (Warfarin) Major Herbal Interactions  Increased Warfarin Effect Decreased Warfarin Effect  Garlic Ginseng Ginkgo biloba Coenzyme  Q10 Green tea St. Johns wort    Coumadin (Warfarin) FOOD Interactions  Eat a consistent number of servings per week of foods HIGH in Vitamin K (1 serving =  cup)  Collards (cooked, or boiled & drained) Kale (cooked, or boiled & drained) Mustard greens (cooked, or boiled & drained) Parsley *serving size only =  cup Spinach (cooked, or boiled & drained) Swiss chard (cooked, or boiled & drained) Turnip greens (cooked, or boiled & drained)  Eat a consistent number of servings per week of foods MEDIUM-HIGH in Vitamin K (1 serving = 1 cup)  Asparagus (cooked, or boiled & drained) Broccoli (cooked, boiled & drained, or raw & chopped) Brussel sprouts (cooked, or boiled & drained) *serving size only =  cup Lettuce, raw (green leaf, endive, romaine) Spinach, raw Turnip greens, raw & chopped   These websites have more information on Coumadin (warfarin):  FailFactory.se; VeganReport.com.au;

## 2014-02-14 NOTE — Progress Notes (Signed)
Pt report called-will tx after finished with lunch. All belongs are packed to go with patient.

## 2014-02-14 NOTE — Progress Notes (Signed)
ANTICOAGULATION CONSULT NOTE - Follow up [facility] for Coumadin  Indication: atrial fibrillation  Allergies  Allergen Reactions  . Codeine Other (See Comments)    Childhood allergy    Patient Measurements: Height: 5\' 3"  (160 cm) Weight: 277 lb 12.5 oz (126 kg) IBW/kg (Calculated) : 52.4  Vital Signs: Temp: 98 F (36.7 C) (09/12 0432) Temp src: Oral (09/12 0432) BP: 125/75 mmHg (09/12 0432) Pulse Rate: 107 (09/12 0432)  Labs:  Recent Labs  02/12/14 1715 02/13/14 0030 02/13/14 0300 02/13/14 0550 02/13/14 1111 02/13/14 1138 02/14/14 0438  HGB 11.1*  --  9.9*  --   --   --   --   HCT 34.9*  --  31.5*  --   --   --   --   PLT 185  --  144*  --   --   --   --   LABPROT 19.7*  --  20.2*  --   --   --  20.9*  INR 1.67*  --  1.72*  --   --   --  1.80*  CREATININE 2.01* 2.02* 2.03* 2.08*  --  2.12* 2.26*  TROPONINI  --  <0.30 <0.30  --  <0.30  --   --     Estimated Creatinine Clearance: 29.1 ml/min (by C-G formula based on Cr of 2.26).   Medical History: Past Medical History  Diagnosis Date  . Diabetes mellitus   . Hypertension   . Hyperlipemia   . Fibromyalgia   . Obesity   . Sleep apnea     No CPAP  . Anxiety   . Hiatal hernia   . NSTEMI (non-ST elevated myocardial infarction) 04/27/2011  . Anemia 04/27/2011  . Restless leg syndrome   . CAD (coronary artery disease)     small dominant right coronary artery with diffuse 95% stenosis. The LAD had 70% stenosis in the mid vessel beyond the diagonal branch but was a small vessel. Circumflex had proximal 30% stenosis.   Marland Kitchen External hemorrhoids   . Arrhythmia 10/11/2011  . Community acquired pneumonia 06/24/2012  . Renal insufficiency 05/06/2011  . Shingles   . CHF exacerbation 10/02/2013    Medications:  Prescriptions prior to admission  Medication Sig Dispense Refill  . albuterol (PROVENTIL HFA;VENTOLIN HFA) 108 (90 BASE) MCG/ACT inhaler Inhale 2 puffs into the lungs every 6 (six) hours as needed  for wheezing or shortness of breath.      Marland Kitchen albuterol (PROVENTIL) (2.5 MG/3ML) 0.083% nebulizer solution Take 2.5 mg by nebulization 4 (four) times daily.       Marland Kitchen aluminum & magnesium hydroxide-simethicone (MYLANTA) 500-450-40 MG/5ML suspension Take 15 mLs by mouth every 2 (two) hours as needed for indigestion.      Marland Kitchen aspirin EC 81 MG tablet Take 81 mg by mouth daily.      Marland Kitchen atorvastatin (LIPITOR) 40 MG tablet Take 40 mg by mouth daily.      . Calcium 500 MG tablet Take 500 mg by mouth 3 (three) times daily.      . calcium-vitamin D (OSCAL WITH D) 500-200 MG-UNIT per tablet Take 1 tablet by mouth 3 (three) times daily.      . carvedilol (COREG) 25 MG tablet Take 1 tablet (25 mg total) by mouth 2 (two) times daily with a meal.  60 tablet  6  . Cholecalciferol (VITAMIN D-3) 1000 UNITS CAPS Take 1 capsule by mouth daily.      . diphenhydrAMINE (BENADRYL) 25 mg capsule  Take 25 mg by mouth every 4 (four) hours as needed for itching.      . docusate sodium (COLACE) 100 MG capsule Take 100 mg by mouth every other day.      . ferrous sulfate 325 (65 FE) MG tablet Take 325 mg by mouth daily with breakfast.       . Fluticasone-Salmeterol (ADVAIR) 250-50 MCG/DOSE AEPB Inhale 1 puff into the lungs 2 (two) times daily.      . furosemide (LASIX) 40 MG tablet Take 40 mg by mouth 2 (two) times daily.      Marland Kitchen gabapentin (NEURONTIN) 100 MG capsule Take 100 mg by mouth 3 (three) times daily.      Marland Kitchen HYDROcodone-acetaminophen (NORCO/VICODIN) 5-325 MG per tablet Take 1 tablet by mouth every 6 (six) hours as needed for moderate pain.      Marland Kitchen insulin aspart (NOVOLOG) 100 UNIT/ML injection Inject 5 Units into the skin 3 (three) times daily with meals.      . insulin glargine (LANTUS) 100 UNIT/ML injection Inject 0.2 mLs (20 Units total) into the skin at bedtime.  20 mL  11  . nitroGLYCERIN (NITROSTAT) 0.4 MG SL tablet Place 1 mg under the tongue every 5 (five) minutes x 3 doses as needed for chest pain. For chest pain       . Omega-3 Fatty Acids (FISH OIL) 1000 MG CAPS Take 1 capsule by mouth 3 (three) times daily.      . traMADol (ULTRAM) 50 MG tablet Take 50 mg by mouth every 6 (six) hours as needed for moderate pain.      . vitamin B-12 (CYANOCOBALAMIN) 500 MCG tablet Take 500 mcg by mouth daily.      Marland Kitchen warfarin (COUMADIN) 5 MG tablet Take 7.5 mg by mouth daily.       Scheduled:  . antiseptic oral rinse  7 mL Mouth Rinse BID  . aspirin EC  81 mg Oral Daily  . atorvastatin  40 mg Oral QPM  . calcium carbonate  1 tablet Oral TID WC  . vitamin B-12  500 mcg Oral Daily  . darbepoetin (ARANESP) injection - NON-DIALYSIS  60 mcg Subcutaneous Q Sat-1800  . docusate sodium  100 mg Oral QODAY  . ferrous sulfate  325 mg Oral Q breakfast  . furosemide  80 mg Intravenous Q6H  . gabapentin  100 mg Oral TID  . Influenza vac split quadrivalent PF  0.5 mL Intramuscular Tomorrow-1000  . insulin aspart  0-15 Units Subcutaneous TID WC  . insulin aspart  0-5 Units Subcutaneous QHS  . insulin glargine  20 Units Subcutaneous QHS  . metoprolol  2.5 mg Intravenous 4 times per day  . mometasone-formoterol  2 puff Inhalation BID  . omega-3 acid ethyl esters  1 g Oral TID  . rOPINIRole  0.5 mg Oral TID  . warfarin  10 mg Oral ONCE-1800  . Warfarin - Pharmacist Dosing Inpatient   Does not apply q1800     Assessment: 73yo female presenting in afib to continue home Coumadin. Subtherapeutic INR upon admit. INR 1.67>>1.72>>1.8 on 9/12. Given 12.5 mg and 10 mg Coumadin over past 2 days, respectively. No s/sx of bleeding, Hgb low stable at 10 (chronic anemia).  Goal of Therapy:  INR 2-3   Plan:  Coumadin 10 mg po x1 tonight Monitor daily INR for dose adjustments  Bani Gianfrancesco E. Niki Cosman, Pharm.D Clinical Pharmacy Resident Pager: [phone] 02/14/2014 7:43 AM

## 2014-02-14 NOTE — Progress Notes (Signed)
NURSING PROGRESS NOTE  [name] [id number] Transfer Data: 02/14/2014 3:17 PM Attending Provider: Delfina Redwood, MD [id number] NOT IN SYSTEM Code Status: Full  [name] is a 73 y.o. female patient transferred from [facility] -No acute distress noted.  -No complaints of shortness of breath.  -No complaints of chest pain.   Cardiac Monitoring: Box # 19 in place.  Blood pressure 133/97, pulse 111, temperature 98 F (36.7 C), temperature source Axillary, resp. rate 16, height 5\' 3"  (1.6 m), weight 126 kg (277 lb 12.5 oz), SpO2 95.00%. MD aware of tachy HR.     Allergies:  Codeine  Past Medical History:   has a past medical history of Diabetes mellitus; Hypertension; Hyperlipemia; Fibromyalgia; Obesity; Sleep apnea; Anxiety; Hiatal hernia; NSTEMI (non-ST elevated myocardial infarction) (04/27/2011); Anemia (04/27/2011); Restless leg syndrome; CAD (coronary artery disease); External hemorrhoids; Arrhythmia (10/11/2011); Community acquired pneumonia (06/24/2012); Renal insufficiency (05/06/2011); Shingles; and CHF exacerbation (10/02/2013).  Past Surgical History:   has past surgical history that includes Appendectomy; Fracture surgery; and ganglion cyst removal.  Social History:   reports that she has never smoked. She has never used smokeless tobacco. She reports that she does not drink alcohol or use illicit drugs.  Skin: red sacrum blanchable. Abrasion right elbow, pink with serosanguinous drainage dressed with foam. Yeasty under breasts. Md notified, orders received.   Patient/Family orientated to room. Information packet given to patient/family. Admission inpatient armband information verified with patient/family to include name and date of birth and placed on patient arm. Side rails up x 2, fall assessment and education completed with patient/family. Patient/family able to verbalize understanding of risk associated with falls and verbalized understanding to call for assistance before  getting out of bed. Call light within reach. Patient/family able to voice and demonstrate understanding of unit orientation instructions.    Will continue to evaluate and treat per MD orders.

## 2014-02-14 NOTE — Evaluation (Signed)
Physical Therapy Evaluation Patient Details Name: [name] MRN: [id number] DOB: 1941-01-05 Today's Date: 02/14/2014   History of Present Illness  Patient is a 73 yo female presented to the hospital on 9/10 with complaints of SOB and CP.  In the ER was found to be in Afib with a rapid rate- imaging showed a right sided pleural effusion with consolidation  Clinical Impression  Patient demonstrates deficits in functional mobility as indicated below. Will benefit from continued skilled PT to address deficits and maximize function. Will see as indicated and progress as tolerated.      Follow Up Recommendations SNF;Supervision/Assistance - 24 hour    Equipment Recommendations   (TBD)    Recommendations for Other Services       Precautions / Restrictions Precautions Precautions: Fall Restrictions Weight Bearing Restrictions: No      Mobility  Bed Mobility Overal bed mobility: Needs Assistance Bed Mobility: Supine to Sit     Supine to sit: Min guard;HOB elevated     General bed mobility comments: required HOB elevated and increased time to perform  Transfers Overall transfer level: Needs assistance Equipment used: Rolling walker (2 wheeled) Transfers: Sit to/from [facility] Sit to Stand: Min assist Stand pivot transfers: Min assist       General transfer comment: VCs for hand placement and safety, increased time to perform, min assist for stability  Ambulation/Gait             General Gait Details: not assessed, increased fatigue with activity  Stairs            Wheelchair Mobility    Modified Rankin (Stroke Patients Only)       Balance Overall balance assessment: Needs assistance         Standing balance support: Bilateral upper extremity supported;During functional activity Standing balance-Leahy Scale: Fair Standing balance comment: fwd flexed posture, heavy reliance on RW                              Pertinent Vitals/Pain Pain Assessment: 0-10 Pain Score: 2  Pain Descriptors / Indicators: Sore Pain Intervention(s): Repositioned    Home Living Family/patient expects to be discharged to:: Private residence Living Arrangements: Children Available Help at Discharge: Family;Available PRN/intermittently Type of Home: House Home Access: Ramped entrance     Home Layout: One level Home Equipment: Walker - 2 wheels;Cane - single point;Wheelchair - manual;Shower seat;Bedside commode Additional Comments: Pt lives with sons however noone will be available to stay with her during the day. Trying to find assist for daytime.     Prior Function Level of Independence: Independent with assistive device(s)               Hand Dominance   Dominant Hand: Right    Extremity/Trunk Assessment   Upper Extremity Assessment: Defer to OT evaluation           Lower Extremity Assessment: Generalized weakness      Cervical / Trunk Assessment: Other exceptions  Communication      Cognition Arousal/Alertness: Awake/alert Behavior During Therapy: WFL for tasks assessed/performed Overall Cognitive Status: Within Functional Limits for tasks assessed                      General Comments      Exercises General Exercises - Lower Extremity Ankle Circles/Pumps: AROM;Both;10 reps Long Arc Quad: AROM;Both;10 reps Hip Flexion/Marching: AROM;Both;10 reps (limited ROM 2/2 weaknes and  body habitus)      Assessment/Plan    PT Assessment Patient needs continued PT services  PT Diagnosis Generalized weakness;Difficulty walking;Abnormality of gait   PT Problem List Decreased strength;Decreased range of motion;Decreased activity tolerance;Decreased balance;Decreased mobility;Cardiopulmonary status limiting activity;Obesity  PT Treatment Interventions DME instruction;Gait training;Functional mobility training;Therapeutic activities;Therapeutic exercise;Balance  training;Patient/family education   PT Goals (Current goals can be found in the Care Plan section) Acute Rehab PT Goals Patient Stated Goal: to get stronger PT Goal Formulation: With patient Time For Goal Achievement: 02/28/14 Potential to Achieve Goals: Good    Frequency Min 3X/week   Barriers to discharge Decreased caregiver support      Co-evaluation               End of Session Equipment Utilized During Treatment: Gait belt;Oxygen Activity Tolerance: Patient limited by fatigue;Patient tolerated treatment well Patient left: in chair;with call bell/phone within reach Nurse Communication: Mobility status         Time: 2774-1287 PT Time Calculation (min): 17 min   Charges:   PT Evaluation $Initial PT Evaluation Tier I: 1 Procedure PT Treatments $Therapeutic Activity: 8-22 mins   PT G CodesDuncan Dull 02/14/2014, 10:28 AM Alben Deeds, PT DPT  [phone]

## 2014-02-14 NOTE — Progress Notes (Signed)
Report received for transfer to [phone]

## 2014-02-14 NOTE — Progress Notes (Signed)
Subjective:  Uncomfortable in the bed- U/A looking like UTI and she does report dysuria- really not much UOP recorded but she says she is making some Objective Vital signs in last 24 hours: Filed Vitals:   02/13/14 1955 02/13/14 2034 02/14/14 0432 02/14/14 0758  BP: 119/68  125/75 116/92  Pulse: 87  107 113  Temp: 97.6 F (36.4 C)  98 F (36.7 C) 97.9 F (36.6 C)  TempSrc: Axillary  Oral Oral  Resp: 28  22 15   Height:      Weight:   126 kg (277 lb 12.5 oz)   SpO2: 98% 97% 98% 99%   Weight change: 6.704 kg (14 lb 12.5 oz)  Intake/Output Summary (Last 24 hours) at 02/14/14 0839 Last data filed at 02/14/14 0835  Gross per 24 hour  Intake      0 ml  Output    350 ml  Net   -350 ml    Assessment/Plan: 73 year old WF with multiple medical issues including CKD (creatinine in the 2's for the last year) presenting with volume overload and Afib with rapid rate  1.Renal- longstanding CKD present for at least the last year. Urine's in the past have not been remarkable- at most 100 of protein. Renal ultrasound unremarkable in the recent past as well. Suspect cardiorenal syndrome for etiology of CKD- EF is OK but has elevated right sided pressures/obesity/OSA type picture. There probably will not be reversibility and it will complicate management of other issues. Also, with her debilitated state- needing to go into NH I agree with her that dialysis would likely worsen her quality of life and have told her that. I would not consider her a good candidate for chronic HD.  Urine today c/w UTI- culture pending but will put on cipro 2. Hypertension/volume - volume overloaded and with likely right sided heart failure. Is on O2 at home. Will diurese as able. I am going to increase lasix to 80 mg q 6 hours and follow. I have taken coreg down some to avoid low BP's.  UOP not reliable- will place foley.   Hopefully diuresis will not lead to significant worsening of renal function due to above.  3. Anemia -  Likely related to CKD-  iron stores pending  and have added aranesp  4. Bones- phos WNL-  PTH pending  5. ID- being started on ABX for lung consolidation 6. Dispo- difficult- she cannot stay at home by herself- will need to go to NH most likely-     Leahmarie Gasiorowski A    Labs: Basic Metabolic Panel:  Recent Labs Lab 02/13/14 0550 02/13/14 1138 02/14/14 0438  NA 140 135* 134*  K 5.0 5.4* 4.9  CL 103 99 100  CO2 22 23 21   GLUCOSE 144* 186* 183*  BUN 48* 49* 50*  CREATININE 2.08* 2.12* 2.26*  CALCIUM 8.3* 8.7 8.7  PHOS  --   --  3.7   Liver Function Tests:  Recent Labs Lab 02/13/14 0030 02/14/14 0438  AST 20  --   ALT 16  --   ALKPHOS 73  --   BILITOT 0.4  --   PROT 6.8  --   ALBUMIN 2.8* 3.0*   No results found for this basename: LIPASE, AMYLASE,  in the last 168 hours No results found for this basename: AMMONIA,  in the last 168 hours CBC:  Recent Labs Lab 02/12/14 1715 02/13/14 0300 02/14/14 0438  WBC 8.1 7.9 7.1  HGB 11.1* 9.9* 10.5*  HCT  34.9* 31.5* 33.7*  MCV 87.9 87.3 89.4  PLT 185 144* 152   Cardiac Enzymes:  Recent Labs Lab 02/13/14 0030 02/13/14 0300 02/13/14 1111  TROPONINI <0.30 <0.30 <0.30   CBG:  Recent Labs Lab 02/13/14 1635 02/13/14 1748 02/13/14 2144 02/14/14 0633 02/14/14 0756  GLUCAP 167* 143* 204* 163* 173*    Iron Studies: No results found for this basename: IRON, TIBC, TRANSFERRIN, FERRITIN,  in the last 72 hours Studies/Results: Dg Chest 2 View  02/12/2014   CLINICAL DATA:  Shortness of breath.  Chest pain.  EXAM: CHEST  2 VIEW  COMPARISON:  One-view chest 02/04/2014.  FINDINGS: The heart is enlarged. A persistent scratch the persistent right lower lobe airspace disease and effusion is present. Lung volumes are low. Previously seen edema and pulmonary vascular congestion has resolved. Right hemidiaphragm may be elevated. There are some air bronchograms into the right lower lobe as well.  IMPRESSION: 1. Persistent  right lower lobe opacification. Recommend CT the chest with contrast for further evaluation of pneumonia or potential underlying mass. 2. Cardiomegaly without failure.   Electronically Signed   By: Lawrence Santiago M.D.   On: 02/12/2014 16:44   Ct Chest Wo Contrast  02/12/2014   CLINICAL DATA:  Abnormal chest x-ray  EXAM: CT CHEST WITHOUT CONTRAST  TECHNIQUE: Multidetector CT imaging of the chest was performed following the standard protocol without IV contrast.  COMPARISON:  Chest x-ray from earlier in the same day  FINDINGS: The left lung is well aerated without focal infiltrate or sizable effusion. A large pleural effusion is noted on the right. Associated basilar infiltrate is noted. No definitive mass lesion is seen although it would be difficult to exclude a mass within the consolidated right lower lobe. No definitive hilar or mediastinal adenopathy is seen. Aortic calcifications are noted. Heavy coronary calcifications are seen. Visualized upper abdomen shows a vague hypodensity within the right lobe of the liver. Ultrasound may be helpful for further evaluation. The lack of IV contrast limits this exam. Small amount of ascites is seen. No acute bony abnormality is seen.  IMPRESSION: Large right-sided pleural effusion with right lower lobe consolidation. No definitive mass lesion is noted although a small lesion could not be totally excluded on the basis of this exam.  Vague hypodensity within the in the right lobe of the liver laterally. Ultrasound may be helpful for further evaluation.   Electronically Signed   By: Inez Catalina M.D.   On: 02/12/2014 21:33   Medications: Infusions:    Scheduled Medications: . antiseptic oral rinse  7 mL Mouth Rinse BID  . aspirin EC  81 mg Oral Daily  . atorvastatin  40 mg Oral QPM  . calcium carbonate  1 tablet Oral TID WC  . vitamin B-12  500 mcg Oral Daily  . darbepoetin (ARANESP) injection - NON-DIALYSIS  60 mcg Subcutaneous Q Sat-1800  . docusate sodium   100 mg Oral QODAY  . ferrous sulfate  325 mg Oral Q breakfast  . furosemide  80 mg Intravenous Q6H  . gabapentin  100 mg Oral TID  . Influenza vac split quadrivalent PF  0.5 mL Intramuscular Tomorrow-1000  . insulin aspart  0-15 Units Subcutaneous TID WC  . insulin aspart  0-5 Units Subcutaneous QHS  . insulin glargine  20 Units Subcutaneous QHS  . metoprolol  2.5 mg Intravenous 4 times per day  . mometasone-formoterol  2 puff Inhalation BID  . omega-3 acid ethyl esters  1 g Oral TID  .  rOPINIRole  0.5 mg Oral TID  . warfarin  10 mg Oral ONCE-1800  . Warfarin - Pharmacist Dosing Inpatient   Does not apply q1800    have reviewed scheduled and prn medications.  Physical Exam: General: obese, pleasant Heart: tachy Lungs: CBS bilat Abdomen: obese, soft, non tender Extremities: pitting edema    02/14/2014,8:39 AM  LOS: 2 days

## 2014-02-15 ENCOUNTER — Inpatient Hospital Stay (HOSPITAL_COMMUNITY): Payer: Medicare Other

## 2014-02-15 LAB — GLUCOSE, CAPILLARY
GLUCOSE-CAPILLARY: 175 mg/dL — AB (ref 70–99)
GLUCOSE-CAPILLARY: 185 mg/dL — AB (ref 70–99)
Glucose-Capillary: 116 mg/dL — ABNORMAL HIGH (ref 70–99)
Glucose-Capillary: 152 mg/dL — ABNORMAL HIGH (ref 70–99)

## 2014-02-15 LAB — URINE CULTURE

## 2014-02-15 LAB — RENAL FUNCTION PANEL
ALBUMIN: 3 g/dL — AB (ref 3.5–5.2)
ANION GAP: 11 (ref 5–15)
BUN: 51 mg/dL — ABNORMAL HIGH (ref 6–23)
CO2: 26 mEq/L (ref 19–32)
Calcium: 8.8 mg/dL (ref 8.4–10.5)
Chloride: 97 mEq/L (ref 96–112)
Creatinine, Ser: 2.44 mg/dL — ABNORMAL HIGH (ref 0.50–1.10)
GFR calc Af Amer: 22 mL/min — ABNORMAL LOW (ref >90)
GFR, EST NON AFRICAN AMERICAN: 19 mL/min — AB (ref >90)
Glucose, Bld: 165 mg/dL — ABNORMAL HIGH (ref 70–99)
PHOSPHORUS: 3.9 mg/dL (ref 2.3–4.6)
Potassium: 4.9 mEq/L (ref 3.7–5.3)
Sodium: 134 mEq/L — ABNORMAL LOW (ref 137–147)

## 2014-02-15 LAB — PROTIME-INR
INR: 2.4 — ABNORMAL HIGH (ref 0.00–1.49)
Prothrombin Time: 26.2 seconds — ABNORMAL HIGH (ref 11.6–15.2)

## 2014-02-15 MED ORDER — FLUCONAZOLE 200 MG PO TABS
200.0000 mg | ORAL_TABLET | Freq: Once | ORAL | Status: AC
  Administered 2014-02-15: 200 mg via ORAL
  Filled 2014-02-15: qty 1

## 2014-02-15 MED ORDER — SODIUM CHLORIDE 0.9 % IJ SOLN
10.0000 mL | INTRAMUSCULAR | Status: DC | PRN
  Administered 2014-02-16: 12 mL
  Administered 2014-02-17: 20 mL
  Administered 2014-02-17: 10 mL
  Administered 2014-02-17 (×2): 20 mL
  Administered 2014-02-18 (×2): 10 mL
  Administered 2014-02-18: 20 mL
  Administered 2014-02-19 (×2): 10 mL

## 2014-02-15 MED ORDER — SODIUM CHLORIDE 0.9 % IV SOLN
125.0000 mg | Freq: Every day | INTRAVENOUS | Status: AC
  Administered 2014-02-15 – 2014-02-17 (×3): 125 mg via INTRAVENOUS
  Filled 2014-02-15 (×6): qty 10

## 2014-02-15 MED ORDER — WARFARIN SODIUM 7.5 MG PO TABS
7.5000 mg | ORAL_TABLET | Freq: Once | ORAL | Status: AC
  Administered 2014-02-15: 7.5 mg via ORAL
  Filled 2014-02-15: qty 1

## 2014-02-15 MED ORDER — HYDROCODONE-ACETAMINOPHEN 5-325 MG PO TABS
1.0000 | ORAL_TABLET | Freq: Four times a day (QID) | ORAL | Status: DC | PRN
  Administered 2014-02-15 – 2014-02-19 (×7): 1 via ORAL
  Filled 2014-02-15 (×6): qty 1

## 2014-02-15 MED ORDER — LEVALBUTEROL HCL 0.63 MG/3ML IN NEBU
0.6300 mg | INHALATION_SOLUTION | Freq: Four times a day (QID) | RESPIRATORY_TRACT | Status: DC | PRN
  Administered 2014-02-15: 0.63 mg via RESPIRATORY_TRACT
  Filled 2014-02-15: qty 3

## 2014-02-15 NOTE — Progress Notes (Signed)
Subjective:  Moved to 5 W- did achieve some diuresis overnight.  U/A repeated- looks much better- culture from previous shows only yeast- no labs today and iv access is an issue as well  Objective Vital signs in last 24 hours: Filed Vitals:   02/14/14 2222 02/15/14 0354 02/15/14 0518 02/15/14 0904  BP: 147/77  118/85 102/80  Pulse: 80  95 98  Temp: 97.5 F (36.4 C)  97.7 F (36.5 C)   TempSrc: Oral  Oral   Resp: 18  18   Height:      Weight:   122.38 kg (269 lb 12.8 oz)   SpO2: 99% 98% 99%    Weight change: -3.619 kg (-7 lb 15.7 oz)  Intake/Output Summary (Last 24 hours) at 02/15/14 0910 Last data filed at 02/15/14 8841  Gross per 24 hour  Intake    430 ml  Output   2275 ml  Net  -1845 ml    Assessment/Plan: 73 year old WF with multiple medical issues including CKD (creatinine in the 2's for the last year) presenting with volume overload and Afib with rapid rate  1.Renal- longstanding CKD present for at least the last year. Urine's in the past have not been remarkable- at most 100 of protein. Renal ultrasound unremarkable in the recent past as well. Suspect cardiorenal syndrome for etiology of CKD- EF is OK but has elevated right sided pressures/obesity/OSA type picture. There probably will not be reversibility and it will complicate management of other issues. Also, with her debilitated state- needing to go into NH I agree with her that dialysis would likely worsen her quality of life and have told her that. I would not consider her a good candidate for chronic HD.  Urine today looks much cleaner- culture yest only yeast- will stop cipro and give one time dose of diflucan 2. Hypertension/volume - volume overloaded and with likely right sided heart failure. Is on O2 at home. Will diurese as able. continue lasix 80 mg q 6 hours and follow. I have taken coreg down some to avoid low BP's.    Hopefully diuresis will not lead to significant worsening of renal function due to above.  3.  Anemia - Likely related to CKD-  iron stores low- will replete   and have added aranesp  4. Bones- phos WNL-  PTH pending  5. ID- was on abx- stopped- give one dose diflucan for yeast 6. Dispo- difficult- she cannot stay at home by herself- will need to go to NH most likely-  7. IV access- blowing IVs daily- will need PICC- this will make labs draws easier as well.  I am OK with PICC placement    Abdulhadi Stopa A    Labs: Basic Metabolic Panel:  Recent Labs Lab 02/13/14 0550 02/13/14 1138 02/14/14 0438  NA 140 135* 134*  K 5.0 5.4* 4.9  CL 103 99 100  CO2 22 23 21   GLUCOSE 144* 186* 183*  BUN 48* 49* 50*  CREATININE 2.08* 2.12* 2.26*  CALCIUM 8.3* 8.7 8.7  PHOS  --   --  3.7   Liver Function Tests:  Recent Labs Lab 02/13/14 0030 02/14/14 0438  AST 20  --   ALT 16  --   ALKPHOS 73  --   BILITOT 0.4  --   PROT 6.8  --   ALBUMIN 2.8* 3.0*   No results found for this basename: LIPASE, AMYLASE,  in the last 168 hours No results found for this basename: AMMONIA,  in the last 168 hours CBC:  Recent Labs Lab 02/12/14 1715 02/13/14 0300 02/14/14 0438  WBC 8.1 7.9 7.1  HGB 11.1* 9.9* 10.5*  HCT 34.9* 31.5* 33.7*  MCV 87.9 87.3 89.4  PLT 185 144* 152   Cardiac Enzymes:  Recent Labs Lab 02/13/14 0030 02/13/14 0300 02/13/14 1111  TROPONINI <0.30 <0.30 <0.30   CBG:  Recent Labs Lab 02/14/14 1137 02/14/14 1628 02/14/14 1742 02/14/14 2220 02/15/14 0759  GLUCAP 209* 230* 225* 174* 116*    Iron Studies:   Recent Labs  02/14/14 0438  IRON 50  TIBC 292  FERRITIN 99   Studies/Results: No results found. Medications: Infusions:    Scheduled Medications: . antiseptic oral rinse  7 mL Mouth Rinse BID  . aspirin EC  81 mg Oral Daily  . atorvastatin  40 mg Oral QPM  . calcium carbonate  1 tablet Oral TID WC  . ciprofloxacin  500 mg Oral Daily  . vitamin B-12  500 mcg Oral Daily  . darbepoetin (ARANESP) injection - NON-DIALYSIS  60 mcg  Subcutaneous Q Sat-1800  . docusate sodium  100 mg Oral QODAY  . ferrous sulfate  325 mg Oral Q breakfast  . furosemide  80 mg Intravenous Q6H  . gabapentin  100 mg Oral TID  . insulin aspart  0-15 Units Subcutaneous TID WC  . insulin aspart  0-5 Units Subcutaneous QHS  . insulin aspart  5 Units Subcutaneous TID WC  . insulin glargine  20 Units Subcutaneous QHS  . metoprolol tartrate  25 mg Oral BID  . mometasone-formoterol  2 puff Inhalation BID  . nystatin   Topical BID  . omega-3 acid ethyl esters  1 g Oral TID  . rOPINIRole  0.5 mg Oral TID  . Warfarin - Pharmacist Dosing Inpatient   Does not apply q1800    have reviewed scheduled and prn medications.  Physical Exam: General: obese, pleasant Heart: tachy Lungs: CBS bilat Abdomen: obese, soft, non tender Extremities: pitting edema    02/15/2014,9:10 AM  LOS: 3 days

## 2014-02-15 NOTE — Progress Notes (Signed)
ANTICOAGULATION CONSULT NOTE - Follow Up Consult  Pharmacy Consult for warfarin Indication: atrial fibrillation  Allergies  Allergen Reactions  . Codeine Other (See Comments)    Childhood allergy    Patient Measurements: Height: 5\' 3"  (160 cm) Weight: 269 lb 12.8 oz (122.38 kg) IBW/kg (Calculated) : 52.4  Vital Signs: Temp: 97.6 F (36.4 C) (09/13 1000) Temp src: Oral (09/13 1000) BP: 138/90 mmHg (09/13 1000) Pulse Rate: 90 (09/13 1000)  Labs:  Recent Labs  02/12/14 1715 02/13/14 0030 02/13/14 0300  02/13/14 1111 02/13/14 1138 02/14/14 0438 02/15/14 0928  HGB 11.1*  --  9.9*  --   --   --  10.5*  --   HCT 34.9*  --  31.5*  --   --   --  33.7*  --   PLT 185  --  144*  --   --   --  152  --   LABPROT 19.7*  --  20.2*  --   --   --  20.9* 26.2*  INR 1.67*  --  1.72*  --   --   --  1.80* 2.40*  CREATININE 2.01* 2.02* 2.03*  < >  --  2.12* 2.26* 2.44*  TROPONINI  --  <0.30 <0.30  --  <0.30  --   --   --   < > = values in this interval not displayed.  Estimated Creatinine Clearance: 26.5 ml/min (by C-G formula based on Cr of 2.44).   Medications:  Scheduled:  . antiseptic oral rinse  7 mL Mouth Rinse BID  . aspirin EC  81 mg Oral Daily  . atorvastatin  40 mg Oral QPM  . calcium carbonate  1 tablet Oral TID WC  . vitamin B-12  500 mcg Oral Daily  . darbepoetin (ARANESP) injection - NON-DIALYSIS  60 mcg Subcutaneous Q Sat-1800  . docusate sodium  100 mg Oral QODAY  . ferric gluconate (FERRLECIT/NULECIT) IV  125 mg Intravenous Daily  . ferrous sulfate  325 mg Oral Q breakfast  . furosemide  80 mg Intravenous Q6H  . gabapentin  100 mg Oral TID  . insulin aspart  0-15 Units Subcutaneous TID WC  . insulin aspart  0-5 Units Subcutaneous QHS  . insulin aspart  5 Units Subcutaneous TID WC  . insulin glargine  20 Units Subcutaneous QHS  . metoprolol tartrate  25 mg Oral BID  . mometasone-formoterol  2 puff Inhalation BID  . nystatin   Topical BID  . omega-3 acid  ethyl esters  1 g Oral TID  . rOPINIRole  0.5 mg Oral TID  . Warfarin - Pharmacist Dosing Inpatient   Does not apply q1800    Assessment: 73 yo F on warfarin PTA for atrial fibrillation. Pharmacy consulted to continue while inpatient. INR subtherapeutic on admission (1.67). INR therapeutic today at 2.4 after receiving 12.5mg , 10mg , and 10mg  the last 3 days respectively. Hgb 10.5, PLCT 152, no bleeding noted.   Home warfarin dose is 7.5mg  daily.  Goal of Therapy:  INR 2-3 Monitor platelets by anticoagulation protocol: Yes   Plan:  Warfarin 7.5mg  x 1 tonight Daily INR/PT Monitor for s/sx of bleeding  Thank you for allowing pharmacy to be part of this patient's care team  [place], Pharm.D Clinical Pharmacy Resident Pager: [phone] 02/15/2014 .11:12 AM

## 2014-02-15 NOTE — Progress Notes (Signed)
Progress Note  [name] TKW:[id number] DOB: 05/28/1941 DOA: 02/12/2014 PCP: PROVIDER NOT IN SYSTEM   Brief narrative: 73 year old female patient with past medical history of atrial fibrillation, reported CHF, diabetes mellitus and chronic kidney disease stage IV to 5 as well as anemia. She was referred to the emergency department by her primary care physician after complaints of chest pain associated with shortness of breath since waking up on the morning of presentation. She also noticed that her blood sugars were higher than usual. Patient reported progressive weight gain over the past several months and this has been associated with worsening lower extremity edema. She denied fevers, chills or productive cough.  In the ER the patient was found to be in atrial fibrillation with RVR she also was hyperglycemic with a mildly elevated anion gap and was started on IV insulin for possible DKA. She has been given Lasix 40 mg IV for CHF/volume overload and started on esmolol infusion for the age of fibrillation with RVR. Chest x-ray demonstrated right-sided opacity and a CT of the chest was pending based on radiologist recommendation.  Since admission the CT the chest has been completed as consistent with a large right pleural effusion. Patient's heart rate has decreased so Cardizem drip has been discontinued in favor of carvedilol. Patient had no leukocytosis or fever presentation so empiric antibiotics have been discontinued since it is doubtful she has healthcare acquired pneumonia. After review of inpatient weights from June to current admission it was noted she has experienced at least a 20+ pound weight gain since June of this year. Her echocardiogram shows normal systolic function in the setting of mild to moderate pulmonary hypertension with moderate tricuspid regurgitation and increased right ventricular systolic pressure. It is suspected that her poor renal function has contributed to chronic  volume overload. Nephrology has been consulted. Her urinalysis was abnormal and the urine culture is pending  HPI/Subjective: Getting up to go to the bathroom  Assessment/Plan:    Atrial fibrillation with RVR/chronic a fib HR better. Blood pressure not as low. Will give metoprolol 25 bid and titrate for HR and as blood pressure tolerates. Coumadin per pharmacy. D/c SCDs.    Acute on chronic respiratory failure with hypoxia/right heart failure/ Pulmonary HTN/ ?? OSA Massively fluid overloaded. [facility]. Diurese as blood pressure tolerates    Chronic kidney disease (CKD), stage V/Hyperkalemia -Appreciate nephrology assistance. Not a good candidate for long term HD.     DM type 2, uncontrolled, with neuropathy CBGs better.  Pleural effusion chronic: diuresis should help.    Morbid obesity    Restless leg -Probably more reflective of underlying diabetic neuropathy as well as pain secondary to significant lower extremity edema-add low-dose Requip to see if helpful    Essential (primary) hypertension BPs not as low.    Abnormal urinalysis Many squamous epis. Will repeat. Nephrology started cipro.  Debility: pt eval. Reportedly has used up all SNF days- defer to Nurse, learning disability.  DVT prophylaxis: coumadin Code Status: Full Family Communication: no family at bedside Disposition Plan/Expected LOS: tNeeds continued diuresis.    Consultants: Nephrology  Procedures: None  Cultures: 9/11 urine culture pending- yeast so far  Antibiotics: Cefepime 9/10 >>> 9/11 Vancomycin 9/10 >>> 9/11  Objective: Blood pressure 102/80, pulse 98, temperature 97.7 F (36.5 C), temperature source Oral, resp. rate 18, height 5\' 3"  (1.6 m), weight 122.38 kg (269 lb 12.8 oz), SpO2 99.00%.  Intake/Output Summary (Last 24 hours) at 02/15/14 9242 Last data filed  at 02/15/14 0814  Gross per 24 hour  Intake    430 ml  Output   2275 ml  Net  -1845 ml   Tele: a fib. Rate 80 -  100  Exam: Gen: alert, chronically ill appearing Chest: CTA without WRR Cardiac: irreg irreg. Without MGR Abdomen: obese, s, nt, nd Extremities: edema 2+. No CC  Scheduled Meds:  Scheduled Meds: . antiseptic oral rinse  7 mL Mouth Rinse BID  . aspirin EC  81 mg Oral Daily  . atorvastatin  40 mg Oral QPM  . calcium carbonate  1 tablet Oral TID WC  . vitamin B-12  500 mcg Oral Daily  . darbepoetin (ARANESP) injection - NON-DIALYSIS  60 mcg Subcutaneous Q Sat-1800  . docusate sodium  100 mg Oral QODAY  . ferric gluconate (FERRLECIT/NULECIT) IV  125 mg Intravenous Daily  . ferrous sulfate  325 mg Oral Q breakfast  . fluconazole  200 mg Oral Once  . furosemide  80 mg Intravenous Q6H  . gabapentin  100 mg Oral TID  . insulin aspart  0-15 Units Subcutaneous TID WC  . insulin aspart  0-5 Units Subcutaneous QHS  . insulin aspart  5 Units Subcutaneous TID WC  . insulin glargine  20 Units Subcutaneous QHS  . metoprolol tartrate  25 mg Oral BID  . mometasone-formoterol  2 puff Inhalation BID  . nystatin   Topical BID  . omega-3 acid ethyl esters  1 g Oral TID  . rOPINIRole  0.5 mg Oral TID  . Warfarin - Pharmacist Dosing Inpatient   Does not apply q1800   Continuous Infusions:    Data Reviewed: Basic Metabolic Panel:  Recent Labs Lab 02/12/14 1726 02/13/14 0030 02/13/14 0300 02/13/14 0550 02/13/14 1138 02/14/14 0438  NA  --  136* 137 140 135* 134*  K  --  4.7 4.5 5.0 5.4* 4.9  CL  --  100 101 103 99 100  CO2  --  20 22 22 23 21   GLUCOSE  --  319* 204* 144* 186* 183*  BUN  --  49* 49* 48* 49* 50*  CREATININE  --  2.02* 2.03* 2.08* 2.12* 2.26*  CALCIUM  --  8.6 8.2* 8.3* 8.7 8.7  MG 1.9  --   --   --   --   --   PHOS  --   --   --   --   --  3.7   Liver Function Tests:  Recent Labs Lab 02/13/14 0030 02/14/14 0438  AST 20  --   ALT 16  --   ALKPHOS 73  --   BILITOT 0.4  --   PROT 6.8  --   ALBUMIN 2.8* 3.0*   No results found for this basename: LIPASE,  AMYLASE,  in the last 168 hours No results found for this basename: AMMONIA,  in the last 168 hours CBC:  Recent Labs Lab 02/12/14 1715 02/13/14 0300 02/14/14 0438  WBC 8.1 7.9 7.1  HGB 11.1* 9.9* 10.5*  HCT 34.9* 31.5* 33.7*  MCV 87.9 87.3 89.4  PLT 185 144* 152   Cardiac Enzymes:  Recent Labs Lab 02/13/14 0030 02/13/14 0300 02/13/14 1111  TROPONINI <0.30 <0.30 <0.30   BNP (last 3 results)  Recent Labs  10/02/13 0905 10/30/13 2010 02/12/14 1518  PROBNP 6285.0* 4361.0* 5454.0*   CBG:  Recent Labs Lab 02/14/14 1137 02/14/14 1628 02/14/14 1742 02/14/14 2220 02/15/14 0759  GLUCAP 209* 230* 225* 174* 116*    Recent  Results (from the past 240 hour(s))  MRSA PCR SCREENING     Status: None   Collection Time    02/13/14  1:20 AM      Result Value Ref Range Status   MRSA by PCR NEGATIVE  NEGATIVE Final   Comment:            The GeneXpert MRSA Assay (FDA     approved for NASAL specimens     only), is one component of a     comprehensive MRSA colonization     surveillance program. It is not     intended to diagnose MRSA     infection nor to guide or     monitor treatment for     MRSA infections.  URINE CULTURE     Status: None   Collection Time    02/13/14  5:40 PM      Result Value Ref Range Status   Specimen Description URINE, CLEAN CATCH   Final   Special Requests NONE   Final   Culture  Setup Time     Final   Value: 02/14/2014 02:13     Performed at [place]     Final   Value: 50,000 COLONIES/ML     Performed at [facility]   Culture     Final   Value: YEAST     Performed at [facility]   Report Status 02/15/2014 FINAL   Final     Studies:   Time spent : 35 min  [facility] [phone]  If 7PM-7AM, please contact night-coverage [email or web address] Password [email or web address] 02/15/2014, 9:26 AM   LOS: 3 days

## 2014-02-15 NOTE — Progress Notes (Signed)
Peripherally Inserted Central Catheter/Midline Placement  The IV Nurse has discussed with the patient and/or persons authorized to consent for the patient, the purpose of this procedure and the potential benefits and risks involved with this procedure.  The benefits include less needle sticks, lab draws from the catheter and patient may be discharged home with the catheter.  Risks include, but not limited to, infection, bleeding, blood clot (thrombus formation), and puncture of an artery; nerve damage and irregular heat beat.  Alternatives to this procedure were also discussed.  PICC/Midline Placement Documentation  PICC / Midline Double Lumen 58/52/77 PICC Right Basilic 41 cm 0 cm (Active)  Indication for Insertion or Continuance of Line Limited venous access - need for IV therapy >5 days (PICC only);Poor Vasculature-patient has had multiple peripheral attempts or PIVs lasting less than 24 hours 02/15/2014 12:00 PM  Exposed Catheter (cm) 0 cm 02/15/2014 12:00 PM  Site Assessment Clean;Dry;Intact 02/15/2014 12:00 PM  Lumen #1 Status Flushed;Saline locked;Blood return noted 02/15/2014 12:00 PM  Lumen #2 Status Flushed;Saline locked;Blood return noted 02/15/2014 12:00 PM  Dressing Type Transparent 02/15/2014 12:00 PM  Dressing Status Clean;Dry;Intact;Antimicrobial disc in place 02/15/2014 12:00 PM  Line Care Connections checked and tightened 02/15/2014 12:00 PM  Dressing Intervention New dressing 02/15/2014 12:00 PM  Dressing Change Due 02/22/14 02/15/2014 12:00 PM       Rolena Infante 02/15/2014, 12:42 PM

## 2014-02-15 NOTE — Progress Notes (Signed)
Pt's IV access leaking and needing restart. Pt has required multiple restarts this admission, is an IV team stick, and pt states it is "hard for them to draw my blood." Dr. Moshe Cipro at bedside and made aware that patient may need PICC line to gain IV access, and until access is started has not received 0900 dose of IV lasix.

## 2014-02-15 NOTE — Progress Notes (Signed)
Pt have wheezing and request breathing treatment. On call physician notified - prn breathing treatment ordered. Respiratory made aware.

## 2014-02-15 NOTE — Progress Notes (Signed)
IV team recommending PICC line. Verbal order received from Dr. Moshe Cipro for placement. Dr. Eliseo Squires aware and okay with plan.

## 2014-02-16 LAB — GLUCOSE, CAPILLARY
GLUCOSE-CAPILLARY: 146 mg/dL — AB (ref 70–99)
Glucose-Capillary: 124 mg/dL — ABNORMAL HIGH (ref 70–99)
Glucose-Capillary: 124 mg/dL — ABNORMAL HIGH (ref 70–99)
Glucose-Capillary: 88 mg/dL (ref 70–99)

## 2014-02-16 LAB — RENAL FUNCTION PANEL
Albumin: 3 g/dL — ABNORMAL LOW (ref 3.5–5.2)
Anion gap: 11 (ref 5–15)
BUN: 55 mg/dL — ABNORMAL HIGH (ref 6–23)
CHLORIDE: 97 meq/L (ref 96–112)
CO2: 28 mEq/L (ref 19–32)
Calcium: 9.1 mg/dL (ref 8.4–10.5)
Creatinine, Ser: 2.45 mg/dL — ABNORMAL HIGH (ref 0.50–1.10)
GFR calc Af Amer: 22 mL/min — ABNORMAL LOW (ref >90)
GFR, EST NON AFRICAN AMERICAN: 19 mL/min — AB (ref >90)
Glucose, Bld: 148 mg/dL — ABNORMAL HIGH (ref 70–99)
POTASSIUM: 4.7 meq/L (ref 3.7–5.3)
Phosphorus: 4 mg/dL (ref 2.3–4.6)
Sodium: 136 mEq/L — ABNORMAL LOW (ref 137–147)

## 2014-02-16 LAB — PROTIME-INR
INR: 2.84 — ABNORMAL HIGH (ref 0.00–1.49)
Prothrombin Time: 29.8 seconds — ABNORMAL HIGH (ref 11.6–15.2)

## 2014-02-16 LAB — PARATHYROID HORMONE, INTACT (NO CA): PTH: 278 pg/mL — ABNORMAL HIGH (ref 14–64)

## 2014-02-16 MED ORDER — WARFARIN SODIUM 5 MG PO TABS
5.0000 mg | ORAL_TABLET | Freq: Once | ORAL | Status: AC
  Administered 2014-02-16: 5 mg via ORAL
  Filled 2014-02-16: qty 1

## 2014-02-16 MED ORDER — CALCITRIOL 0.25 MCG PO CAPS
0.2500 ug | ORAL_CAPSULE | Freq: Every day | ORAL | Status: DC
  Administered 2014-02-16 – 2014-02-20 (×5): 0.25 ug via ORAL
  Filled 2014-02-16 (×5): qty 1

## 2014-02-16 NOTE — Progress Notes (Signed)
Spoke to provider on call K. Schorr and she stated it's ok to give pt 20 units of lantus with cbg of 88. Encouraged pt to eat bedtime snack

## 2014-02-16 NOTE — Progress Notes (Signed)
Subjective:   [facility] appears to have picked up some 2750 out yesterday - 550 so far today Pt "feels lousy" Needs to have a BM Wants me to get her back in bed  Objective Vital signs in last 24 hours: Filed Vitals:   02/16/14 0919 02/16/14 1016 02/16/14 1245 02/16/14 1407  BP: 149/104 139/80  142/79  Pulse: 93 99  93  Temp:  97.6 F (36.4 C)  98.3 F (36.8 C)  TempSrc:  Oral  Oral  Resp:  18  18  Height:      Weight:      SpO2:   95% 93%   Weight change: 0.998 kg (2 lb 3.2 oz)  Intake/Output Summary (Last 24 hours) at 02/16/14 1500 Last data filed at 02/16/14 1408  Gross per 24 hour  Intake    630 ml  Output   2650 ml  Net  -2020 ml    Physical Exam: General: Morbidly obese, pleasant, but says feels bad. Up in the chair Heart: tachy irreg S1S2 no S3 Lungs: clear ant. Post diminished breath sounds Abdomen: Huge pannus, obese, soft, non tender Extremities: pitting edema to knees bilaterally Foley draining yellow clear urine  Labs: Basic Metabolic Panel:  Recent Labs Lab 02/14/14 0438 02/15/14 0928 02/16/14 0454  NA 134* 134* 136*  K 4.9 4.9 4.7  CL 100 97 97  CO2 21 26 28   GLUCOSE 183* 165* 148*  BUN 50* 51* 55*  CREATININE 2.26* 2.44* 2.45*  CALCIUM 8.7 8.8 9.1  PHOS 3.7 3.9 4.0   Liver Function Tests:  Recent Labs Lab 02/13/14 0030 02/14/14 0438 02/15/14 0928 02/16/14 0454  AST 20  --   --   --   ALT 16  --   --   --   ALKPHOS 73  --   --   --   BILITOT 0.4  --   --   --   PROT 6.8  --   --   --   ALBUMIN 2.8* 3.0* 3.0* 3.0*   Recent Labs Lab 02/12/14 1715 02/13/14 0300 02/14/14 0438  WBC 8.1 7.9 7.1  HGB 11.1* 9.9* 10.5*  HCT 34.9* 31.5* 33.7*  MCV 87.9 87.3 89.4  PLT 185 144* 152    Recent Labs Lab 02/13/14 0030 02/13/14 0300 02/13/14 1111  TROPONINI <0.30 <0.30 <0.30   CBG:  Recent Labs Lab 02/15/14 1249 02/15/14 1808 02/15/14 2135 02/16/14 0810 02/16/14 1145  GLUCAP 152* 175* 185* 124* 146*    Recent Labs  02/14/14 0438  IRON 50  TIBC 292  FERRITIN 99   Studies/Results: Dg Chest Port 1 View  02/15/2014   CLINICAL DATA:  Confirm line placement  EXAM: PORTABLE CHEST - 1 VIEW  COMPARISON:  02/12/2014  FINDINGS: Right arm PICC line tip is identified with tip in the cavoatrial junction. The heart size is moderately enlarged. There is a large right pleural effusion. Atelectasis is noted within the right midlung and left base.  IMPRESSION: 1. Tip of right arm PICC line is in the cavoatrial junction. 2. No change in right pleural effusion and atelectasis   Electronically Signed   By: Kerby Moors M.D.   On: 02/15/2014 13:47   Scheduled Medications: . antiseptic oral rinse  7 mL Mouth Rinse BID  . aspirin EC  81 mg Oral Daily  . atorvastatin  40 mg Oral QPM  . calcium carbonate  1 tablet Oral TID WC  . vitamin B-12  500 mcg Oral Daily  . darbepoetin (  ARANESP) injection - NON-DIALYSIS  60 mcg Subcutaneous Q Sat-1800  . docusate sodium  100 mg Oral QODAY  . ferric gluconate (FERRLECIT/NULECIT) IV  125 mg Intravenous Daily  . ferrous sulfate  325 mg Oral Q breakfast  . furosemide  80 mg Intravenous Q6H  . gabapentin  100 mg Oral TID  . insulin aspart  0-15 Units Subcutaneous TID WC  . insulin aspart  0-5 Units Subcutaneous QHS  . insulin aspart  5 Units Subcutaneous TID WC  . insulin glargine  20 Units Subcutaneous QHS  . metoprolol tartrate  25 mg Oral BID  . mometasone-formoterol  2 puff Inhalation BID  . nystatin   Topical BID  . omega-3 acid ethyl esters  1 g Oral TID  . rOPINIRole  0.5 mg Oral TID  . warfarin  5 mg Oral ONCE-1800  . Warfarin - Pharmacist Dosing Inpatient   Does not apply q1800      Background: 73 year old WF with multiple medical issues including CKD (creatinine in the 2's for the last year) presenting with volume overload and Afib with rapid rate   Assessment/Recommendations  Renal- longstanding CKD present for at least the last year. Urine's in the past have not  been remarkable- at most 100 of protein. Renal ultrasound unremarkable in the recent past as well. Suspect cardiorenal syndrome for etiology of CKD- EF is OK but has elevated right sided pressures/obesity/OSA type picture. There probably will not be reversibility and it will complicate management of other issues. Also, with her debilitated state- needing to go into NH. (Dr. Moshe Cipro and she discussed fact that dialysis would likely worsen her quality of life and that she would not be considered good candidate for chronic HD).   Hypertension/volume - volume overloaded and with likely right sided heart failure. On O2 at home. Will diurese as able. continue lasix 80 mg q 6 hours and follow. Coreg was taken down some to avoid low BP's.    Hopefully diuresis will not lead to significant worsening of renal function and so far not much change in kidney function past 2 days after initial bump, and  [facility] appears to have picked up Anemia - Likely related to CKD-  iron stores low- repleting and have added aranesp  Bones- phos WNL-  PTH 278 - add daily calcitriol 0.25 mcg  ID- was on abx- stopped- gave one dose diflucan for yeast Atrial fib - coumadin/BB Dispo- difficult- she cannot stay at home by herself- will need to go to NH most likely-  IV access- blowing IVs daily-   Dr. Moshe Cipro OK'd  PICC placement  Selmer Adduci B  02/16/2014,3:00 PM  LOS: 4 days

## 2014-02-16 NOTE — Progress Notes (Signed)
Progress Note  [name] LGX:[id number] DOB: 1941-04-04 DOA: 02/12/2014 PCP: PROVIDER NOT IN SYSTEM   Brief narrative: 73 year old female patient with past medical history of atrial fibrillation, reported CHF, diabetes mellitus and chronic kidney disease stage IV to 5 as well as anemia. She was referred to the emergency department by her primary care physician after complaints of chest pain associated with shortness of breath since waking up on the morning of presentation. She also noticed that her blood sugars were higher than usual. Patient reported progressive weight gain over the past several months and this has been associated with worsening lower extremity edema. She denied fevers, chills or productive cough.  In the ER the patient was found to be in atrial fibrillation with RVR she also was hyperglycemic with a mildly elevated anion gap and was started on IV insulin for possible DKA. She has been given Lasix 40 mg IV for CHF/volume overload and started on esmolol infusion for the age of fibrillation with RVR. Chest x-ray demonstrated right-sided opacity and a CT of the chest was pending based on radiologist recommendation.  Since admission the CT the chest has been completed as consistent with a large right pleural effusion. Patient's heart rate has decreased so Cardizem drip has been discontinued in favor of carvedilol. Patient had no leukocytosis or fever presentation so empiric antibiotics have been discontinued since it is doubtful she has healthcare acquired pneumonia. After review of inpatient weights from June to current admission it was noted she has experienced at least a 20+ pound weight gain since June of this year. Her echocardiogram shows normal systolic function in the setting of mild to moderate pulmonary hypertension with moderate tricuspid regurgitation and increased right ventricular systolic pressure. It is suspected that her poor renal function has contributed to chronic  volume overload. Nephrology has been consulted. Her urinalysis was abnormal and the urine culture is pending  HPI/Subjective: Breathing better No overnight events  Assessment/Plan:    Atrial fibrillation with RVR/chronic a fib HR better. Blood pressure not as low. Will give metoprolol 25 bid and titrate for HR and as blood pressure tolerates. Coumadin per pharmacy. D/c SCDs.    Acute on chronic respiratory failure with hypoxia/right heart failure/ Pulmonary HTN/ ?? OSA Massively fluid overloaded. [facility]. Diurese as blood pressure tolerates    Chronic kidney disease (CKD), stage V/Hyperkalemia -Appreciate nephrology assistance. Not a good candidate for long term HD.     DM type 2, uncontrolled, with neuropathy CBGs better.  Pleural effusion chronic: diuresis should help.    Morbid obesity    Restless leg -Probably more reflective of underlying diabetic neuropathy as well as pain secondary to significant lower extremity edema-add low-dose Requip to see if helpful    Essential (primary) hypertension BPs not as low.  SNF when diuresed fully  DVT prophylaxis: coumadin Code Status: Full Family Communication: no family at bedside Disposition Plan/Expected LOS: Needs continued diuresis.    Consultants: Nephrology  Procedures: None  Cultures: 9/11 urine culture pending- yeast so far  Antibiotics: Cefepime 9/10 >>> 9/11 Vancomycin 9/10 >>> 9/11  Objective: Blood pressure 113/95, pulse 88, temperature 97.5 F (36.4 C), temperature source Oral, resp. rate 20, height 5\' 3"  (1.6 m), weight 123.378 kg (272 lb), SpO2 95.00%.  Intake/Output Summary (Last 24 hours) at 02/16/14 0811 Last data filed at 02/16/14 0635  Gross per 24 hour  Intake    690 ml  Output   2750 ml  Net  -2060 ml  Tele: a fib. Rate 80 - 100  Exam: Gen: alert, chronically ill appearing, obese Chest: CTA without WRR Cardiac: irreg irreg. Without MGR Abdomen: obese, s, nt,  nd Extremities: edema 2+. No CC  Scheduled Meds:  Scheduled Meds: . antiseptic oral rinse  7 mL Mouth Rinse BID  . aspirin EC  81 mg Oral Daily  . atorvastatin  40 mg Oral QPM  . calcium carbonate  1 tablet Oral TID WC  . vitamin B-12  500 mcg Oral Daily  . darbepoetin (ARANESP) injection - NON-DIALYSIS  60 mcg Subcutaneous Q Sat-1800  . docusate sodium  100 mg Oral QODAY  . ferric gluconate (FERRLECIT/NULECIT) IV  125 mg Intravenous Daily  . ferrous sulfate  325 mg Oral Q breakfast  . furosemide  80 mg Intravenous Q6H  . gabapentin  100 mg Oral TID  . insulin aspart  0-15 Units Subcutaneous TID WC  . insulin aspart  0-5 Units Subcutaneous QHS  . insulin aspart  5 Units Subcutaneous TID WC  . insulin glargine  20 Units Subcutaneous QHS  . metoprolol tartrate  25 mg Oral BID  . mometasone-formoterol  2 puff Inhalation BID  . nystatin   Topical BID  . omega-3 acid ethyl esters  1 g Oral TID  . rOPINIRole  0.5 mg Oral TID  . Warfarin - Pharmacist Dosing Inpatient   Does not apply q1800   Continuous Infusions:    Data Reviewed: Basic Metabolic Panel:  Recent Labs Lab 02/12/14 1726  02/13/14 0550 02/13/14 1138 02/14/14 0438 02/15/14 0928 02/16/14 0454  NA  --   < > 140 135* 134* 134* 136*  K  --   < > 5.0 5.4* 4.9 4.9 4.7  CL  --   < > 103 99 100 97 97  CO2  --   < > 22 23 21 26 28   GLUCOSE  --   < > 144* 186* 183* 165* 148*  BUN  --   < > 48* 49* 50* 51* 55*  CREATININE  --   < > 2.08* 2.12* 2.26* 2.44* 2.45*  CALCIUM  --   < > 8.3* 8.7 8.7 8.8 9.1  MG 1.9  --   --   --   --   --   --   PHOS  --   --   --   --  3.7 3.9 4.0  < > = values in this interval not displayed. Liver Function Tests:  Recent Labs Lab 02/13/14 0030 02/14/14 0438 02/15/14 0928 02/16/14 0454  AST 20  --   --   --   ALT 16  --   --   --   ALKPHOS 73  --   --   --   BILITOT 0.4  --   --   --   PROT 6.8  --   --   --   ALBUMIN 2.8* 3.0* 3.0* 3.0*   No results found for this basename:  LIPASE, AMYLASE,  in the last 168 hours No results found for this basename: AMMONIA,  in the last 168 hours CBC:  Recent Labs Lab 02/12/14 1715 02/13/14 0300 02/14/14 0438  WBC 8.1 7.9 7.1  HGB 11.1* 9.9* 10.5*  HCT 34.9* 31.5* 33.7*  MCV 87.9 87.3 89.4  PLT 185 144* 152   Cardiac Enzymes:  Recent Labs Lab 02/13/14 0030 02/13/14 0300 02/13/14 1111  TROPONINI <0.30 <0.30 <0.30   BNP (last 3 results)  Recent Labs  10/02/13 0905 10/30/13  2010 02/12/14 1518  PROBNP 6285.0* 4361.0* 5454.0*   CBG:  Recent Labs Lab 02/14/14 2220 02/15/14 0759 02/15/14 1249 02/15/14 1808 02/15/14 2135  GLUCAP 174* 116* 152* 175* 185*    Recent Results (from the past 240 hour(s))  MRSA PCR SCREENING     Status: None   Collection Time    02/13/14  1:20 AM      Result Value Ref Range Status   MRSA by PCR NEGATIVE  NEGATIVE Final   Comment:            The GeneXpert MRSA Assay (FDA     approved for NASAL specimens     only), is one component of a     comprehensive MRSA colonization     surveillance program. It is not     intended to diagnose MRSA     infection nor to guide or     monitor treatment for     MRSA infections.  URINE CULTURE     Status: None   Collection Time    02/13/14  5:40 PM      Result Value Ref Range Status   Specimen Description URINE, CLEAN CATCH   Final   Special Requests NONE   Final   Culture  Setup Time     Final   Value: 02/14/2014 02:13     Performed at [facility]     Final   Value: 50,000 COLONIES/ML     Performed at [facility]   Culture     Final   Value: YEAST     Performed at [facility]   Report Status 02/15/2014 FINAL   Final     Studies:   Time spent : 25 min  Eulogio Bear DO [facility] [phone]  If 7PM-7AM, please contact night-coverage [email or web address] Password [email or web address] 02/16/2014, 8:11 AM   LOS: 4 days

## 2014-02-16 NOTE — Progress Notes (Signed)
Physical Therapy Treatment Patient Details Name: [name] MRN: [id number] DOB: [birth year] Today's Date: 02/16/2014    History of Present Illness Patient is a 73 yo female presented to the hospital on 9/10 with complaints of SOB and CP.  In the ER was found to be in Afib with a rapid rate- imaging showed a right sided pleural effusion with consolidation    PT Comments    Patient is progressing slowly towards physical therapy goals, ambulating up to 30 feet with SpO2 dropping rapidly to 74% on 2L supplemental O2. Returned quickly to 96% saturation with 4L supplemental O2 applied. Pt limited by fatigue but willing to work with therapy as able. Patient will continue to benefit from skilled physical therapy services to further improve independence with functional mobility.    Follow Up Recommendations  SNF;Supervision/Assistance - 24 hour     Equipment Recommendations   (TBD)    Recommendations for Other Services OT consult     Precautions / Restrictions Precautions Precautions: Fall Restrictions Weight Bearing Restrictions: No    Mobility  Bed Mobility Overal bed mobility: Needs Assistance Bed Mobility: Supine to Sit     Supine to sit: Min guard;HOB elevated     General bed mobility comments: Min guard for safety. No physical assist needed Required extra time. VC for use of rail.  Transfers Overall transfer level: Needs assistance Equipment used: Rolling walker (2 wheeled) Transfers: Sit to/from Stand Sit to Stand: Min guard         General transfer comment: Min guard for safety with VC for hand placement. Performed x2 from lowest bed setting.  Ambulation/Gait Ambulation/Gait assistance: Min assist Ambulation Distance (Feet): 30 Feet (additonal bout of 15) Assistive device: Rolling walker (2 wheeled) Gait Pattern/deviations: Step-through pattern;Decreased stride length;Shuffle   Gait velocity interpretation: Below normal speed for age/gender General Gait  Details: Educated on safe DME use with rolling walker. Pt fatigued rapidly requiring several standing rest breaks to complete distance. Min assist for walker stability. SpO2 dropped to 74% on 2L supplemental O2 - rose to 96% with 4L and instructions for pursed lip breathing.   Stairs            Wheelchair Mobility    Modified Rankin (Stroke Patients Only)       Balance                                    Cognition Arousal/Alertness: Awake/alert Behavior During Therapy: WFL for tasks assessed/performed Overall Cognitive Status: Within Functional Limits for tasks assessed                      Exercises      General Comments        Pertinent Vitals/Pain Pain Assessment: 0-10 Pain Score:  ("a little upper back pain") Pain Intervention(s): Limited activity within patient's tolerance;Monitored during session;Repositioned;Premedicated before session    Home Living                      Prior Function            PT Goals (current goals can now be found in the care plan section) Acute Rehab PT Goals PT Goal Formulation: With patient Time For Goal Achievement: 02/28/14 Potential to Achieve Goals: Good Progress towards PT goals: Progressing toward goals    Frequency  Min 3X/week    PT Plan Current plan remains  appropriate    Co-evaluation             End of Session Equipment Utilized During Treatment: Oxygen Activity Tolerance: Patient limited by fatigue Patient left: in chair;with call bell/phone within reach;with chair alarm set     Time: 1129-1204 PT Time Calculation (min): 35 min  Charges:  $Gait Training: 8-22 mins $Therapeutic Activity: 8-22 mins                    G Codes:       [employer], [place]  Ellouise Newer 02/16/2014, 12:59 PM

## 2014-02-16 NOTE — Consult Note (Addendum)
WOC note Consult requested for use of Interdry via phone.  Bedside nurse describes patient with a high BMI that has red macerated skin in folds of abd and breast.  Description consistent with intertrigo and areas are frequently moist with partial thickness breakdown.  Orders and instructions provided for use of Interdry silver-impregnated fabric.  This topical treatment absorbs drainage and provides antimicrobial benefits and should be left in place for 5 days for optimal plan of care. Please re-consult if further assistance is needed.  Thank-you,  Julien Girt MSN, [place], [place], [place], [place]

## 2014-02-16 NOTE — Progress Notes (Signed)
PHARMACY NOTE  Pharmacy Consult :  73 y.o. female is currently on chronic Coumadin for atrial fibrillation w/RVR .   Dosing Wt :  123 kg  Hematology :  Recent Labs  02/14/14 0438 02/15/14 0928 02/16/14 0454  HGB 10.5*  --   --   HCT 33.7*  --   --   PLT 152  --   --   LABPROT 20.9* 26.2* 29.8*  INR 1.80* 2.40* 2.84*  CREATININE 2.26* 2.44* 2.45*    Current Medication[s] Include: Medication PTA: Prescriptions prior to admission  Medication Sig Dispense Refill  . albuterol (PROVENTIL HFA;VENTOLIN HFA) 108 (90 BASE) MCG/ACT inhaler Inhale 2 puffs into the lungs every 6 (six) hours as needed for wheezing or shortness of breath.      Marland Kitchen albuterol (PROVENTIL) (2.5 MG/3ML) 0.083% nebulizer solution Take 2.5 mg by nebulization 4 (four) times daily.       Marland Kitchen aluminum & magnesium hydroxide-simethicone (MYLANTA) 500-450-40 MG/5ML suspension Take 15 mLs by mouth every 2 (two) hours as needed for indigestion.      Marland Kitchen aspirin EC 81 MG tablet Take 81 mg by mouth daily.      Marland Kitchen atorvastatin (LIPITOR) 40 MG tablet Take 40 mg by mouth daily.      . Calcium 500 MG tablet Take 500 mg by mouth 3 (three) times daily.      . calcium-vitamin D (OSCAL WITH D) 500-200 MG-UNIT per tablet Take 1 tablet by mouth 3 (three) times daily.      . carvedilol (COREG) 25 MG tablet Take 1 tablet (25 mg total) by mouth 2 (two) times daily with a meal.  60 tablet  6  . Cholecalciferol (VITAMIN D-3) 1000 UNITS CAPS Take 1 capsule by mouth daily.      . diphenhydrAMINE (BENADRYL) 25 mg capsule Take 25 mg by mouth every 4 (four) hours as needed for itching.      . docusate sodium (COLACE) 100 MG capsule Take 100 mg by mouth every other day.      . ferrous sulfate 325 (65 FE) MG tablet Take 325 mg by mouth daily with breakfast.       . Fluticasone-Salmeterol (ADVAIR) 250-50 MCG/DOSE AEPB Inhale 1 puff into the lungs 2 (two) times daily.      . furosemide (LASIX) 40 MG tablet Take 40 mg by mouth  2 (two) times daily.      Marland Kitchen gabapentin (NEURONTIN) 100 MG capsule Take 100 mg by mouth 3 (three) times daily.      Marland Kitchen HYDROcodone-acetaminophen (NORCO/VICODIN) 5-325 MG per tablet Take 1 tablet by mouth every 6 (six) hours as needed for moderate pain.      Marland Kitchen insulin aspart (NOVOLOG) 100 UNIT/ML injection Inject 5 Units into the skin 3 (three) times daily with meals.      . insulin glargine (LANTUS) 100 UNIT/ML injection Inject 0.2 mLs (20 Units total) into the skin at bedtime.  20 mL  11  . nitroGLYCERIN (NITROSTAT) 0.4 MG SL tablet Place 1 mg under the tongue every 5 (five) minutes x 3 doses as needed for chest pain. For chest pain      . Omega-3 Fatty Acids (FISH OIL) 1000 MG CAPS Take 1 capsule by mouth 3 (three) times daily.      . traMADol (ULTRAM) 50 MG tablet Take 50 mg by mouth every 6 (six) hours as needed for moderate pain.      . vitamin B-12 (CYANOCOBALAMIN) 500 MCG tablet Take 500 mcg by  mouth daily.      Marland Kitchen warfarin (COUMADIN) 5 MG tablet Take 7.5 mg by mouth daily.       Scheduled:  Scheduled:  . antiseptic oral rinse  7 mL Mouth Rinse BID  . aspirin EC  81 mg Oral Daily  . atorvastatin  40 mg Oral QPM  . calcium carbonate  1 tablet Oral TID WC  . vitamin B-12  500 mcg Oral Daily  . darbepoetin (ARANESP) injection - NON-DIALYSIS  60 mcg Subcutaneous Q Sat-1800  . docusate sodium  100 mg Oral QODAY  . ferric gluconate (FERRLECIT/NULECIT) IV  125 mg Intravenous Daily  . ferrous sulfate  325 mg Oral Q breakfast  . furosemide  80 mg Intravenous Q6H  . gabapentin  100 mg Oral TID  . insulin aspart  0-15 Units Subcutaneous TID WC  . insulin aspart  0-5 Units Subcutaneous QHS  . insulin aspart  5 Units Subcutaneous TID WC  . insulin glargine  20 Units Subcutaneous QHS  . metoprolol tartrate  25 mg Oral BID  . mometasone-formoterol  2 puff Inhalation BID  . nystatin   Topical BID  . omega-3 acid ethyl esters  1 g Oral TID  . rOPINIRole  0.5 mg Oral TID  . Warfarin - Pharmacist  Dosing Inpatient   Does not apply q1800   Antibiotic[s]: Anti-infectives   Start     Dose/Rate Route Frequency Ordered Stop   02/15/14 1000  fluconazole (DIFLUCAN) tablet 200 mg     200 mg Oral  Once 02/15/14 0924 02/15/14 1016   02/14/14 1000  ciprofloxacin (CIPRO) tablet 500 mg  Status:  Discontinued     500 mg Oral Daily 02/14/14 0851 02/15/14 0924   02/13/14 2200  vancomycin (VANCOCIN) 1,500 mg in sodium chloride 0.9 % 500 mL IVPB  Status:  Discontinued     1,500 mg 250 mL/hr over 120 Minutes Intravenous Every 24 hours 02/12/14 2324 02/13/14 1213   02/13/14 0000  ceFEPIme (MAXIPIME) 2 g in dextrose 5 % 50 mL IVPB  Status:  Discontinued     2 g 100 mL/hr over 30 Minutes Intravenous Every 24 hours 02/12/14 2324 02/13/14 1213   02/12/14 2330  vancomycin (VANCOCIN) 2,500 mg in sodium chloride 0.9 % 500 mL IVPB     2,500 mg 250 mL/hr over 120 Minutes Intravenous  Once 02/12/14 2324 02/13/14 0234     Assessment :  Today's INR trending up.   INR  2.84.    No evidence of bleeding complications observed.  Goal :  INR goal is 2-3  Plan : 1. Coumadin 5 mg po today x 1. 2. Daily INR's, CBC. Monitor for bleeding complications.   Follow Platelet counts.  Tuck Dulworth, Craig Guess,  Pharm.D  02/16/2014  12:01 PM

## 2014-02-17 LAB — GLUCOSE, CAPILLARY
GLUCOSE-CAPILLARY: 100 mg/dL — AB (ref 70–99)
GLUCOSE-CAPILLARY: 139 mg/dL — AB (ref 70–99)
Glucose-Capillary: 48 mg/dL — ABNORMAL LOW (ref 70–99)
Glucose-Capillary: 110 mg/dL — ABNORMAL HIGH (ref 70–99)
Glucose-Capillary: 144 mg/dL — ABNORMAL HIGH (ref 70–99)
Glucose-Capillary: 153 mg/dL — ABNORMAL HIGH (ref 70–99)

## 2014-02-17 LAB — RENAL FUNCTION PANEL
ALBUMIN: 2.8 g/dL — AB (ref 3.5–5.2)
ANION GAP: 10 (ref 5–15)
BUN: 55 mg/dL — AB (ref 6–23)
CO2: 30 mEq/L (ref 19–32)
Calcium: 9.4 mg/dL (ref 8.4–10.5)
Chloride: 99 mEq/L (ref 96–112)
Creatinine, Ser: 2.3 mg/dL — ABNORMAL HIGH (ref 0.50–1.10)
GFR calc Af Amer: 23 mL/min — ABNORMAL LOW (ref >90)
GFR, EST NON AFRICAN AMERICAN: 20 mL/min — AB (ref >90)
Glucose, Bld: 79 mg/dL (ref 70–99)
PHOSPHORUS: 3.8 mg/dL (ref 2.3–4.6)
POTASSIUM: 4.4 meq/L (ref 3.7–5.3)
Sodium: 139 mEq/L (ref 137–147)

## 2014-02-17 LAB — CBC
HEMATOCRIT: 35 % — AB (ref 36.0–46.0)
Hemoglobin: 10.8 g/dL — ABNORMAL LOW (ref 12.0–15.0)
MCH: 27.9 pg (ref 26.0–34.0)
MCHC: 30.9 g/dL (ref 30.0–36.0)
MCV: 90.4 fL (ref 78.0–100.0)
PLATELETS: 109 10*3/uL — AB (ref 150–400)
RBC: 3.87 MIL/uL (ref 3.87–5.11)
RDW: 18 % — AB (ref 11.5–15.5)
WBC: 6.9 10*3/uL (ref 4.0–10.5)

## 2014-02-17 LAB — PROTIME-INR
INR: 3.38 — AB (ref 0.00–1.49)
PROTHROMBIN TIME: 34.2 s — AB (ref 11.6–15.2)

## 2014-02-17 MED ORDER — POLYETHYLENE GLYCOL 3350 17 G PO PACK
17.0000 g | PACK | Freq: Every day | ORAL | Status: DC
  Administered 2014-02-17 – 2014-02-20 (×4): 17 g via ORAL
  Filled 2014-02-17 (×4): qty 1

## 2014-02-17 MED ORDER — DOCUSATE SODIUM 100 MG PO CAPS
100.0000 mg | ORAL_CAPSULE | Freq: Two times a day (BID) | ORAL | Status: DC
  Administered 2014-02-17 – 2014-02-20 (×6): 100 mg via ORAL
  Filled 2014-02-17 (×7): qty 1

## 2014-02-17 MED ORDER — INSULIN GLARGINE 100 UNIT/ML ~~LOC~~ SOLN
15.0000 [IU] | Freq: Every day | SUBCUTANEOUS | Status: DC
  Administered 2014-02-17 – 2014-02-19 (×3): 15 [IU] via SUBCUTANEOUS
  Filled 2014-02-17 (×5): qty 0.15

## 2014-02-17 MED ORDER — DEXTROSE 50 % IV SOLN
INTRAVENOUS | Status: AC
  Filled 2014-02-17: qty 50

## 2014-02-17 MED ORDER — DEXTROSE 50 % IV SOLN
50.0000 mL | Freq: Once | INTRAVENOUS | Status: AC | PRN
  Administered 2014-02-17: 50 mL via INTRAVENOUS

## 2014-02-17 NOTE — Progress Notes (Signed)
Progress Note  [name] QZR:[id number] DOB: December 26, 1940 DOA: 02/12/2014 PCP: PROVIDER NOT IN SYSTEM   Brief narrative: 73 year old female patient with past medical history of atrial fibrillation, reported CHF, diabetes mellitus and chronic kidney disease stage IV to 5 as well as anemia. She was referred to the emergency department by her primary care physician after complaints of chest pain associated with shortness of breath since waking up on the morning of presentation. She also noticed that her blood sugars were higher than usual. Patient reported progressive weight gain over the past several months and this has been associated with worsening lower extremity edema. She denied fevers, chills or productive cough.  In the ER the patient was found to be in atrial fibrillation with RVR she also was hyperglycemic with a mildly elevated anion gap and was started on IV insulin for possible DKA. She has been given Lasix 40 mg IV for CHF/volume overload and started on esmolol infusion for the age of fibrillation with RVR. Chest x-ray demonstrated right-sided opacity and a CT of the chest was pending based on radiologist recommendation.  Since admission the CT the chest has been completed as consistent with a large right pleural effusion. Patient's heart rate has decreased so Cardizem drip has been discontinued in favor of carvedilol. Patient had no leukocytosis or fever presentation so empiric antibiotics have been discontinued since it is doubtful she has healthcare acquired pneumonia. After review of inpatient weights from June to current admission it was noted she has experienced at least a 20+ pound weight gain since June of this year. Her echocardiogram shows normal systolic function in the setting of mild to moderate pulmonary hypertension with moderate tricuspid regurgitation and increased right ventricular systolic pressure. It is suspected that her poor renal function has contributed to chronic  volume overload. Nephrology has been consulted. Her urinalysis was abnormal and the urine culture is pending  HPI/Subjective: Did not sleep well this AM Feels constipated  Assessment/Plan:    Atrial fibrillation with RVR/chronic a fib HR better. Blood pressure not as low. Will give metoprolol 25 bid and titrate for HR and as blood pressure tolerates. Coumadin per pharmacy. D/c SCDs.    Acute on chronic respiratory failure with hypoxia/right heart failure/ Pulmonary HTN/ ?? OSA Massively fluid overloaded. [place] neph[facility]. Diurese as blood pressure tolerates    Chronic kidney disease (CKD), stage V/Hyperkalemia -Appreciate nephrology assistance. Not a good candidate for long term HD.     DM type 2, uncontrolled, with neuropathy CBGs better.  Pleural effusion chronic: diuresis should help.    Morbid obesity    Restless leg -Probably more reflective of underlying diabetic neuropathy as well as pain secondary to significant lower extremity edema-add low-dose Requip to see if helpful    Essential (primary) hypertension BPs not as low.  SNF when diuresed fully  DVT prophylaxis: coumadin Code Status: Full Family Communication: no family at bedside Disposition Plan/Expected LOS: Needs continued diuresis.    Consultants: Nephrology  Procedures: None  Cultures: 9/11 urine culture pending- yeast so far  Antibiotics: Cefepime 9/10 >>> 9/11 Vancomycin 9/10 >>> 9/11  Objective: Blood pressure 135/75, pulse 58, temperature 97.5 F (36.4 C), temperature source Axillary, resp. rate 22, height 5\' 3"  (1.6 m), weight 119.523 kg (263 lb 8 oz), SpO2 98.00%.  Intake/Output Summary (Last 24 hours) at 02/17/14 0802 Last data filed at 02/17/14 0555  Gross per 24 hour  Intake    240 ml  Output   3575 ml  Net  -3335 ml   Tele: a fib. Rate 80 - 100  Exam: Gen: alert, chronically ill appearing, obese Chest: CTA without WRR Cardiac: irreg irreg. Without MGR Abdomen: obese,  s, nt, nd Extremities: edema lessening  Scheduled Meds:  Scheduled Meds: . antiseptic oral rinse  7 mL Mouth Rinse BID  . aspirin EC  81 mg Oral Daily  . atorvastatin  40 mg Oral QPM  . calcitRIOL  0.25 mcg Oral Daily  . calcium carbonate  1 tablet Oral TID WC  . vitamin B-12  500 mcg Oral Daily  . darbepoetin (ARANESP) injection - NON-DIALYSIS  60 mcg Subcutaneous Q Sat-1800  . docusate sodium  100 mg Oral QODAY  . ferric gluconate (FERRLECIT/NULECIT) IV  125 mg Intravenous Daily  . ferrous sulfate  325 mg Oral Q breakfast  . furosemide  80 mg Intravenous Q6H  . gabapentin  100 mg Oral TID  . insulin aspart  0-15 Units Subcutaneous TID WC  . insulin aspart  0-5 Units Subcutaneous QHS  . insulin aspart  5 Units Subcutaneous TID WC  . insulin glargine  15 Units Subcutaneous QHS  . metoprolol tartrate  25 mg Oral BID  . mometasone-formoterol  2 puff Inhalation BID  . nystatin   Topical BID  . omega-3 acid ethyl esters  1 g Oral TID  . polyethylene glycol  17 g Oral Daily  . rOPINIRole  0.5 mg Oral TID  . Warfarin - Pharmacist Dosing Inpatient   Does not apply q1800   Continuous Infusions:    Data Reviewed: Basic Metabolic Panel:  Recent Labs Lab 02/12/14 1726  02/13/14 1138 02/14/14 0438 02/15/14 0928 02/16/14 0454 02/17/14 0535  NA  --   < > 135* 134* 134* 136* 139  K  --   < > 5.4* 4.9 4.9 4.7 4.4  CL  --   < > 99 100 97 97 99  CO2  --   < > 23 21 26 28 30   GLUCOSE  --   < > 186* 183* 165* 148* 79  BUN  --   < > 49* 50* 51* 55* 55*  CREATININE  --   < > 2.12* 2.26* 2.44* 2.45* 2.30*  CALCIUM  --   < > 8.7 8.7 8.8 9.1 9.4  MG 1.9  --   --   --   --   --   --   PHOS  --   --   --  3.7 3.9 4.0 3.8  < > = values in this interval not displayed. Liver Function Tests:  Recent Labs Lab 02/13/14 0030 02/14/14 0438 02/15/14 0928 02/16/14 0454 02/17/14 0535  AST 20  --   --   --   --   ALT 16  --   --   --   --   ALKPHOS 73  --   --   --   --   BILITOT 0.4  --    --   --   --   PROT 6.8  --   --   --   --   ALBUMIN 2.8* 3.0* 3.0* 3.0* 2.8*   No results found for this basename: LIPASE, AMYLASE,  in the last 168 hours No results found for this basename: AMMONIA,  in the last 168 hours CBC:  Recent Labs Lab 02/12/14 1715 02/13/14 0300 02/14/14 0438 02/17/14 0535  WBC 8.1 7.9 7.1 6.9  HGB 11.1* 9.9* 10.5* 10.8*  HCT 34.9* 31.5* 33.7* 35.0*  MCV 87.9 87.3 89.4 90.4  PLT 185 144* 152 PENDING   Cardiac Enzymes:  Recent Labs Lab 02/13/14 0030 02/13/14 0300 02/13/14 1111  TROPONINI <0.30 <0.30 <0.30   BNP (last 3 results)  Recent Labs  10/02/13 0905 10/30/13 2010 02/12/14 1518  PROBNP 6285.0* 4361.0* 5454.0*   CBG:  Recent Labs Lab 02/15/14 2135 02/16/14 0810 02/16/14 1145 02/16/14 1714 02/16/14 2219  GLUCAP 185* 124* 146* 124* 88    Recent Results (from the past 240 hour(s))  MRSA PCR SCREENING     Status: None   Collection Time    02/13/14  1:20 AM      Result Value Ref Range Status   MRSA by PCR NEGATIVE  NEGATIVE Final   Comment:            The GeneXpert MRSA Assay (FDA     approved for NASAL specimens     only), is one component of a     comprehensive MRSA colonization     surveillance program. It is not     intended to diagnose MRSA     infection nor to guide or     monitor treatment for     MRSA infections.  URINE CULTURE     Status: None   Collection Time    02/13/14  5:40 PM      Result Value Ref Range Status   Specimen Description URINE, CLEAN CATCH   Final   Special Requests NONE   Final   Culture  Setup Time     Final   Value: 02/14/2014 02:13     Performed at [place]     Final   Value: 50,000 COLONIES/ML     Performed at [facility]   Culture     Final   Value: YEAST     Performed at [facility]   Report Status 02/15/2014 FINAL   Final     Studies:   Time spent : 25 min  Eulogio Bear DO [facility] [phone]  If 7PM-7AM, please  contact night-coverage [email or web address] Password [email or web address] 02/17/2014, 8:02 AM   LOS: 5 days

## 2014-02-17 NOTE — Progress Notes (Signed)
CSW (Clinical Education officer, museum) continues to follow. CSW notified [facility] that pt does have Medicaid.  [place], [place]

## 2014-02-17 NOTE — Progress Notes (Addendum)
Subjective:    Still complains of being constipated Not SOB Diuresing on IV lasix with drop in weight correlating with the increase in UOP  Objective Vital signs in last 24 hours: Filed Vitals:   02/17/14 0825 02/17/14 0831 02/17/14 0900 02/17/14 1100  BP: 138/108  150/91 150/88  Pulse: 95  86 88  Temp:      TempSrc:      Resp:      Height:      Weight:      SpO2:  94%     Weight change: -3.856 kg (-8 lb 8 oz)  Intake/Output Summary (Last 24 hours) at 02/17/14 1222 Last data filed at 02/17/14 1130  Gross per 24 hour  Intake    500 ml  Output   4150 ml  Net  -3650 ml   Weight trending 02/17/14 0548 119.523 kg (263 lb 8 oz)  02/16/14 0530 123.378 kg (272 lb)  02/15/14 0518 122.38 kg (269 lb 12.8 oz)  02/14/14 0432 126 kg (277 lb 12.5 oz) 02/13/14 0456 126.1 kg (278 lb)   Physical Exam: General: Morbidly obese WF. C/o constipation Heart: tachy irreg S1S2 no S3 Lungs: clear ant. Post diminished breath sounds Abdomen: Huge pannus, obese, soft, non tender Extremities: pitting edema to knees bilaterally Foley draining yellow clear urine  Labs: Basic Metabolic Panel:  Recent Labs Lab 02/15/14 0928 02/16/14 0454 02/17/14 0535  NA 134* 136* 139  K 4.9 4.7 4.4  CL 97 97 99  CO2 26 28 30   GLUCOSE 165* 148* 79  BUN 51* 55* 55*  CREATININE 2.44* 2.45* 2.30*  CALCIUM 8.8 9.1 9.4  PHOS 3.9 4.0 3.8   Liver Function Tests:  Recent Labs Lab 02/13/14 0030  02/15/14 0928 02/16/14 0454 02/17/14 0535  AST 20  --   --   --   --   ALT 16  --   --   --   --   ALKPHOS 73  --   --   --   --   BILITOT 0.4  --   --   --   --   PROT 6.8  --   --   --   --   ALBUMIN 2.8*  < > 3.0* 3.0* 2.8*  < > = values in this interval not displayed.  Recent Labs Lab 02/12/14 1715 02/13/14 0300 02/14/14 0438 02/17/14 0535  WBC 8.1 7.9 7.1 6.9  HGB 11.1* 9.9* 10.5* 10.8*  HCT 34.9* 31.5* 33.7* 35.0*  MCV 87.9 87.3 89.4 90.4  PLT 185 144* 152 109*    Recent Labs Lab  02/13/14 0030 02/13/14 0300 02/13/14 1111  TROPONINI <0.30 <0.30 <0.30   CBG:  Recent Labs Lab 02/16/14 2219 02/17/14 0814 02/17/14 0855 02/17/14 1103 02/17/14 1159  GLUCAP 88 48* 110* 144* 153*   No results found for this basename: IRON, TIBC, TRANSFERRIN, FERRITIN,  in the last 72 hours Studies/Results: Dg Chest Port 1 View  02/15/2014   CLINICAL DATA:  Confirm line placement  EXAM: PORTABLE CHEST - 1 VIEW  COMPARISON:  02/12/2014  FINDINGS: Right arm PICC line tip is identified with tip in the cavoatrial junction. The heart size is moderately enlarged. There is a large right pleural effusion. Atelectasis is noted within the right midlung and left base.  IMPRESSION: 1. Tip of right arm PICC line is in the cavoatrial junction. 2. No change in right pleural effusion and atelectasis   Electronically Signed   By: Queen [place].D.  On: 02/15/2014 13:47   Scheduled Medications: . antiseptic oral rinse  7 mL Mouth Rinse BID  . aspirin EC  81 mg Oral Daily  . atorvastatin  40 mg Oral QPM  . calcitRIOL  0.25 mcg Oral Daily  . calcium carbonate  1 tablet Oral TID WC  . vitamin B-12  500 mcg Oral Daily  . darbepoetin (ARANESP) injection - NON-DIALYSIS  60 mcg Subcutaneous Q Sat-1800  . docusate sodium  100 mg Oral QODAY  . ferrous sulfate  325 mg Oral Q breakfast  . furosemide  80 mg Intravenous Q6H  . gabapentin  100 mg Oral TID  . insulin aspart  0-15 Units Subcutaneous TID WC  . insulin aspart  0-5 Units Subcutaneous QHS  . insulin aspart  5 Units Subcutaneous TID WC  . insulin glargine  15 Units Subcutaneous QHS  . metoprolol tartrate  25 mg Oral BID  . mometasone-formoterol  2 puff Inhalation BID  . nystatin   Topical BID  . omega-3 acid ethyl esters  1 g Oral TID  . polyethylene glycol  17 g Oral Daily  . rOPINIRole  0.5 mg Oral TID  . Warfarin - Pharmacist Dosing Inpatient   Does not apply q1800     Background:  73 year old WF with multiple medical issues including  DM, OSA, R CHF, RLS, HTN, known CKD (creatinine in the 2's for the last year) presented with volume overload and Afib with rapid rate   Assessment/Recommendations  Renal - AKI on CKD - longstanding CKD present for at least the last year. Urine's in the past have not been remarkable- at most 100 of protein. Renal ultrasound unremarkable in the recent past as well. Suspect cardiorenal syndrome/DM for etiology of CKD- EF is OK but has elevated right sided pressures/obesity/OSA type picture. There probably will not be reversibility and it will complicate management of other issues. Also, with her debilitated state- needing to go into NH. Dialysis would likely worsen her quality of life and that she would not be considered good candidate for chronic HD. Is diuresing and losing weight on current diuretic regimen but still with lots of fluid to go - would continue same for now.  Renal function surprisingly stable so far despite diuresis.  Creatinine today down to 2.3 (peaked at 2.45) Hypertension/volume - volume overloaded and with likely right sided heart failure. On O2 at home. Diuresing with current diuretic program.  Would continue with the IV lasix for now.  Anemia - Likely related to CKD-  iron stores low- repleted (had 3 doses ferrlecit and now on po fe) and added aranesp 60 QWk. Hb stable Bones- phos WNL-  PTH 278 - added daily calcitriol 0.25 mcg  ID- was on abx- stopped- gave one dose diflucan for yeast Atrial fib - coumadin/BB Dispo- difficult- she cannot stay at home by herself- will need to go to NH most likely-   Jamal Maes, MD [facility] [phone] Pager 02/17/2014, 12:32 PM

## 2014-02-17 NOTE — Progress Notes (Signed)
ANTICOAGULATION CONSULT NOTE - Follow Up Consult  Pharmacy Consult for Couamdin Indication: atrial fibrillation  Allergies  Allergen Reactions  . Codeine Other (See Comments)    Childhood allergy    Patient Measurements: Height: 5\' 3"  (160 cm) Weight: 263 lb 8 oz (119.523 kg) IBW/kg (Calculated) : 52.4  Vital Signs: Temp: 97.5 F (36.4 C) (09/15 0548) Temp src: Axillary (09/15 0548) BP: 150/91 mmHg (09/15 0900) Pulse Rate: 86 (09/15 0900)  Labs:  Recent Labs  02/15/14 0928 02/16/14 0454 02/17/14 0535  HGB  --   --  10.8*  HCT  --   --  35.0*  PLT  --   --  109*  LABPROT 26.2* 29.8* 34.2*  INR 2.40* 2.84* 3.38*  CREATININE 2.44* 2.45* 2.30*    Estimated Creatinine Clearance: 27.6 ml/min (by C-G formula based on Cr of 2.3).  Assessment:   Home Coumadin regimen was 7.5 mg daily.    INR was subtherapeutic (1.67) on admit 9/10 pm and extra 5 mg dose given that night. Coumadin 10 mg given on 9/11  and 9/12.  INR back into target range (2.40) on 9/13 and usual dose of 7.5 mg given.  Coumadin dose decreased to 5 mg on 9/14 (INR 2.84), but  INR up to 3.38 today.   Day #3 of 3 IV Iron; also on FeSO4 daily.  Platelet count 185 on admit, has trended down to 109K.  No bleeding noted.  Goal of Therapy:  INR 2-3 Monitor platelets by anticoagulation protocol: Yes   Plan:   Hold Coumadin today.  Continue daily PT/INR.  CBC in am.  Arty Baumgartner, [place] Pager: [phone] 02/17/2014,10:52 AM

## 2014-02-17 NOTE — Progress Notes (Signed)
Hypoglycemic Event  CBG: 48  Treatment: D50 IV 50 mL  Symptoms: Shaky  Follow-up CBG: Time:0850 CBG Result:110  Possible Reasons for Event: Unknown  Comments/MD notified:  Dr. Eliseo Squires test paged at [place].  Will continue to monitor.      [name], [name]  Remember to initiate Hypoglycemia Order Set & complete

## 2014-02-17 NOTE — Plan of Care (Signed)
Problem: Phase I Progression Outcomes Goal: Initial discharge plan identified Outcome: Completed/Met Date Met:  02/17/14 SNF placement

## 2014-02-17 NOTE — Clinical Documentation Improvement (Signed)
Possible Clinical Conditions?   Chronic Systolic Congestive Heart Failure Chronic Diastolic Congestive Heart Failure Chronic Systolic & Diastolic Congestive Heart Failure Acute Systolic Congestive Heart Failure Acute Diastolic Congestive Heart Failure Acute Systolic & Diastolic Congestive Heart Failure Acute on Chronic Systolic Congestive Heart Failure Acute on Chronic Diastolic Congestive Heart Failure Acute on Chronic Systolic & Diastolic Congestive Heart Failure Other Condition Cannot Clinically Determine   Risk Factors: Volume overloaded, with likely right sided heart failure per 9/12 progress notes.  Diagnostics: 9/10: proBNP: 5454.0.   Thank You, Theron Arista, Clinical Documentation Specialist:  [phone]  [place] Information Management

## 2014-02-18 DIAGNOSIS — I10 Essential (primary) hypertension: Secondary | ICD-10-CM

## 2014-02-18 LAB — CBC
HCT: 35.1 % — ABNORMAL LOW (ref 36.0–46.0)
Hemoglobin: 10.9 g/dL — ABNORMAL LOW (ref 12.0–15.0)
MCH: 27.8 pg (ref 26.0–34.0)
MCHC: 31.1 g/dL (ref 30.0–36.0)
MCV: 89.5 fL (ref 78.0–100.0)
PLATELETS: 119 10*3/uL — AB (ref 150–400)
RBC: 3.92 MIL/uL (ref 3.87–5.11)
RDW: 18 % — ABNORMAL HIGH (ref 11.5–15.5)
WBC: 7.3 10*3/uL (ref 4.0–10.5)

## 2014-02-18 LAB — PROTIME-INR
INR: 3.52 — ABNORMAL HIGH (ref 0.00–1.49)
Prothrombin Time: 35.3 seconds — ABNORMAL HIGH (ref 11.6–15.2)

## 2014-02-18 LAB — GLUCOSE, CAPILLARY
GLUCOSE-CAPILLARY: 145 mg/dL — AB (ref 70–99)
Glucose-Capillary: 90 mg/dL (ref 70–99)
Glucose-Capillary: 96 mg/dL (ref 70–99)
Glucose-Capillary: 83 mg/dL (ref 70–99)

## 2014-02-18 LAB — RENAL FUNCTION PANEL
ALBUMIN: 2.8 g/dL — AB (ref 3.5–5.2)
ANION GAP: 12 (ref 5–15)
BUN: 56 mg/dL — ABNORMAL HIGH (ref 6–23)
CO2: 30 mEq/L (ref 19–32)
Calcium: 9.4 mg/dL (ref 8.4–10.5)
Chloride: 94 mEq/L — ABNORMAL LOW (ref 96–112)
Creatinine, Ser: 2.23 mg/dL — ABNORMAL HIGH (ref 0.50–1.10)
GFR calc Af Amer: 24 mL/min — ABNORMAL LOW (ref >90)
GFR calc non Af Amer: 21 mL/min — ABNORMAL LOW (ref >90)
Glucose, Bld: 97 mg/dL (ref 70–99)
POTASSIUM: 4.5 meq/L (ref 3.7–5.3)
Phosphorus: 3.3 mg/dL (ref 2.3–4.6)
SODIUM: 136 meq/L — AB (ref 137–147)

## 2014-02-18 MED ORDER — FUROSEMIDE 80 MG PO TABS
160.0000 mg | ORAL_TABLET | Freq: Two times a day (BID) | ORAL | Status: DC
  Administered 2014-02-18 – 2014-02-19 (×2): 160 mg via ORAL
  Filled 2014-02-18 (×4): qty 2

## 2014-02-18 MED ORDER — SODIUM CHLORIDE 0.9 % IV SOLN
125.0000 mg | Freq: Every day | INTRAVENOUS | Status: AC
  Administered 2014-02-18 – 2014-02-19 (×2): 125 mg via INTRAVENOUS
  Filled 2014-02-18 (×3): qty 10

## 2014-02-18 NOTE — Clinical Social Work Note (Signed)
SNF search is underway- CSW updated patient today of possible offers but nothing confirmed- SNF search expanded to [place] and [place] counties. Patient agreeable- will update tomorrow on hopeful SNF Medicaid options.  Eduard Clos, MSW, [place]

## 2014-02-18 NOTE — Progress Notes (Signed)
Subjective:    Good UOP on current diuretics (lasix is still IV) Less SOB Weight went up but does not correlate with I/O records  Objective Vital signs in last 24 hours: Filed Vitals:   02/18/14 0227 02/18/14 0404 02/18/14 0846 02/18/14 1055  BP:  145/78  142/83  Pulse:  81  85  Temp:  97.6 F (36.4 C)  98.6 F (37 C)  TempSrc:  Axillary  Axillary  Resp:  20  18  Height:      Weight: 120.112 kg (264 lb 12.8 oz)     SpO2:  100% 94% 97%   Weight change: 0.59 kg (1 lb 4.8 oz)  Intake/Output Summary (Last 24 hours) at 02/18/14 1203 Last data filed at 02/18/14 0955  Gross per 24 hour  Intake    720 ml  Output   3800 ml  Net  -3080 ml   Weight trending 02/18/14 0227 120.112 kg (264 lb 12.8 oz) 02/17/14 0548 119.523 kg (263 lb 8 oz)  02/16/14 0530 123.378 kg (272 lb)  02/15/14 0518 122.38 kg (269 lb 12.8 oz)  02/14/14 0432 126 kg (277 lb 12.5 oz) 02/13/14 0456 126.1 kg (278 lb)   Physical Exam: General: Morbidly obese WF.  Heart: tachy irreg S1S2 no S3 Lungs: clear ant. Post diminished breath sounds Abdomen: Large pannus, obese, soft, non tender Extremities: pitting edema to knees bilaterally some better  Labs: Basic Metabolic Panel:  Recent Labs Lab 02/16/14 0454 02/17/14 0535 02/18/14 0300  NA 136* 139 136*  K 4.7 4.4 4.5  CL 97 99 94*  CO2 28 30 30   GLUCOSE 148* 79 97  BUN 55* 55* 56*  CREATININE 2.45* 2.30* 2.23*  CALCIUM 9.1 9.4 9.4  PHOS 4.0 3.8 3.3    Recent Labs Lab 02/13/14 0030  02/16/14 0454 02/17/14 0535 02/18/14 0300  AST 20  --   --   --   --   ALT 16  --   --   --   --   ALKPHOS 73  --   --   --   --   BILITOT 0.4  --   --   --   --   PROT 6.8  --   --   --   --   ALBUMIN 2.8*  < > 3.0* 2.8* 2.8*  < > = values in this interval not displayed.  Recent Labs Lab 02/12/14 1715 02/13/14 0300 02/14/14 0438 02/17/14 0535 02/18/14 0300  WBC 8.1 7.9 7.1 6.9 7.3  HGB 11.1* 9.9* 10.5* 10.8* 10.9*  HCT 34.9* 31.5* 33.7* 35.0* 35.1*   MCV 87.9 87.3 89.4 90.4 89.5  PLT 185 144* 152 109* 119*    Recent Labs Lab 02/13/14 0030 02/13/14 0300 02/13/14 1111  TROPONINI <0.30 <0.30 <0.30   CBG:  Recent Labs Lab 02/17/14 1103 02/17/14 1159 02/17/14 1714 02/17/14 2158 02/18/14 0741  GLUCAP 144* 153* 100* 139* 90   Scheduled Medications: . aspirin EC  81 mg Oral Daily  . atorvastatin  40 mg Oral QPM  . calcitRIOL  0.25 mcg Oral Daily  . calcium carbonate  1 tablet Oral TID WC  . vitamin B-12  500 mcg Oral Daily  . darbepoetin (ARANESP) injection - NON-DIALYSIS  60 mcg Subcutaneous Q Sat-1800  . docusate sodium  100 mg Oral BID  . ferric gluconate (FERRLECIT/NULECIT) IV  125 mg Intravenous Daily  . ferrous sulfate  325 mg Oral Q breakfast  . furosemide  80 mg Intravenous Q6H  .  gabapentin  100 mg Oral TID  . insulin aspart  0-15 Units Subcutaneous TID WC  . insulin aspart  0-5 Units Subcutaneous QHS  . insulin aspart  5 Units Subcutaneous TID WC  . insulin glargine  15 Units Subcutaneous QHS  . metoprolol tartrate  25 mg Oral BID  . mometasone-formoterol  2 puff Inhalation BID  . nystatin   Topical BID  . omega-3 acid ethyl esters  1 g Oral TID  . polyethylene glycol  17 g Oral Daily  . rOPINIRole  0.5 mg Oral TID  . Warfarin - Pharmacist Dosing Inpatient   Does not apply q1800     Background:  73 year old WF with multiple medical issues including DM, OSA, R CHF, RLS, HTN, known CKD (creatinine in the 2's for the last year) presented with volume overload and Afib with rapid rate   Assessment/Recommendations  AKI on CKD - longstanding CKD present for at least the last year.  Suspect cardiorenal syndrome/DM for etiology of CKD- EF is OK but has elevated right sided pressures/obesity/OSA type picture. There probably will not be much reversibility and it will complicate management of other issues. Also, with her debilitated state- needing to go into NH. Dialysis would likely worsen her quality of life and that  she would not be considered good candidate for chronic HD. Fortunately is diuresing and losing weight on current diuretic regimen but still with lots of fluid to go.  Renal function surprisingly stable so far.  Creatinine today down to 2.23 (peaked at 2.45) Volume still up, but may be manageable now with a regimen of po lasix  - I would recommend 160 po BID of lasix as starting point for po dosing (on 80 IV q6 right now)  Anemia - Likely related to CKD-  iron stores low- repleted (had 3 doses ferrlecit and now on po fe; finish out course of 5) and is on aranesp 60 QWk. Hb stable Bones- phos WNL-  PTH 278 - have added daily calcitriol 0.25 mcg  ID- was on abx- stopped- gave one dose diflucan for yeast Atrial fib - coumadin/BB Dispo- difficult- she cannot stay at home by herself- will need to go to NH most likely. Let's see how she does on po diuretic dosing.    Jamal Maes, MD [facility] [phone] Pager 02/18/2014, 12:03 PM

## 2014-02-18 NOTE — Progress Notes (Addendum)
[facility]  Progress Note  [name]:[id number] DOB: May 06, 1941 DOA: 02/12/2014 PCP: PROVIDER NOT IN SYSTEM   Brief narrative: 73 year old female patient with past medical history of atrial fibrillation, reported CHF, diabetes mellitus and chronic kidney disease stage IV to 5 as well as anemia. She was referred to the emergency department by her primary care physician after complaints of chest pain associated with shortness of breath since waking up on the morning of presentation. She also noticed that her blood sugars were higher than usual. Patient reported progressive weight gain over the past several months and this has been associated with worsening lower extremity edema. She denied fevers, chills or productive cough.  In the ER the patient was found to be in atrial fibrillation with RVR she also was hyperglycemic with a mildly elevated anion gap and was started on IV insulin for possible DKA. She has been given Lasix 40 mg IV for CHF/volume overload and started on esmolol infusion for the age of fibrillation with RVR. Chest x-ray demonstrated right-sided opacity and a CT of the chest was pending based on radiologist recommendation.  Since admission the CT the chest has been completed as consistent with a large right pleural effusion. Patient's heart rate has decreased so Cardizem drip has been discontinued in favor of metoprolol. Patient had no leukocytosis or fever presentation so empiric antibiotics have been discontinued since it is doubtful she has healthcare acquired pneumonia. After review of inpatient weights from June to current admission it was noted she has experienced at least a 20+ pound weight gain since June of this year. Her echocardiogram shows normal systolic function in the setting of mild to moderate pulmonary hypertension with moderate tricuspid regurgitation and increased right ventricular systolic pressure. It is suspected that her poor renal function has  contributed to chronic volume overload. Nephrology has been consulted. Her urinalysis was abnormal and the urine culture is pending  HPI/Subjective: Feeling better.  Several BM yesterday (solid stool).  Reports breathing is at baseline.  Lower extremity edema improving.  Assessment/Plan:  Atrial fibrillation with RVR/chronic a fib HR better. Blood pressure not as low.  metoprolol 25 bid and titrate for HR and as blood pressure tolerates.  Coumadin per pharmacy.   Acute on chronic respiratory failure with hypoxia/right heart failure/ Pulmonary HTN Was massively fluid overloaded now improving. [name] nephrology.  Per I/Os down 9L since admission.    Acute on Chronic Diastolic CHF -mostly right sided -diuretics as above  Chronic kidney disease (CKD), stage V/Hyperkalemia Appreciate nephrology assistance. Not a good candidate for long term HD.  Nephrology guiding diuresis.  Currently receiving 80 Mg Lasix IV q 6.  Will defer to Dr. Lorrene Reid. Creatinine down trending.  DM type 2, uncontrolled, with neuropathy CBGs better. Lantus and novolog.  Pleural effusion chronic Breathing comfortably on O2 (uses 2-3L at home)  Morbid obesity  Restless leg Probably more reflective of underlying diabetic neuropathy as well as pain secondary to significant lower extremity edema low-dose Requip added to see if helpful  Essential (primary) hypertension Controlled.   Metoprolol with high dose lasix currently.  Abnormal U/A Urine culture - Yeast 50,000 colonies.  SNF when diuresed fully  DVT prophylaxis: coumadin Code Status: Full Family Communication: dtr and son in law at bedside. Disposition Plan/Expected LOS: SNF when appropriate.   Consultants: Nephrology  Procedures: None  Cultures: 9/11 urine culture pending- yeast so far  Antibiotics: Cefepime 9/10 >>> 9/11 Vancomycin 9/10 >>> 9/11  Objective: Blood pressure 142/83, pulse 85,  temperature 98.6 F (37 C),  temperature source Axillary, resp. rate 18, height 5\' 3"  (1.6 m), weight 120.112 kg (264 lb 12.8 oz), SpO2 97.00%. I/O last 3 completed shifts: In: 740 [P.O.:700; I.V.:40] Out: 5625 [Urine:5625] Total I/O In: 360 [P.O.:360] Out: 1600 [Urine:1600]     Exam: Gen: alert, chronically ill appearing, obese.  Sitting in chair.  O2 at 2.5 liters Chest: CTA without WRR.  Decreased breath sounds. Cardiac: irreg irreg. Without MGR Abdomen: obese, s, nt, nd, +BS Extremities: Lower extremities with 1+ edema bilaterally, light pink in color (per patient and dtr look much better)  Scheduled Meds:  Scheduled Meds: . aspirin EC  81 mg Oral Daily  . atorvastatin  40 mg Oral QPM  . calcitRIOL  0.25 mcg Oral Daily  . calcium carbonate  1 tablet Oral TID WC  . vitamin B-12  500 mcg Oral Daily  . darbepoetin (ARANESP) injection - NON-DIALYSIS  60 mcg Subcutaneous Q Sat-1800  . docusate sodium  100 mg Oral BID  . ferric gluconate (FERRLECIT/NULECIT) IV  125 mg Intravenous Daily  . ferrous sulfate  325 mg Oral Q breakfast  . furosemide  80 mg Intravenous Q6H  . gabapentin  100 mg Oral TID  . insulin aspart  0-15 Units Subcutaneous TID WC  . insulin aspart  0-5 Units Subcutaneous QHS  . insulin aspart  5 Units Subcutaneous TID WC  . insulin glargine  15 Units Subcutaneous QHS  . metoprolol tartrate  25 mg Oral BID  . mometasone-formoterol  2 puff Inhalation BID  . nystatin   Topical BID  . omega-3 acid ethyl esters  1 g Oral TID  . polyethylene glycol  17 g Oral Daily  . rOPINIRole  0.5 mg Oral TID  . Warfarin - Pharmacist Dosing Inpatient   Does not apply q1800   Continuous Infusions:    Data Reviewed: Basic Metabolic Panel:  Recent Labs Lab 02/12/14 1726  02/14/14 0438 02/15/14 0928 02/16/14 0454 02/17/14 0535 02/18/14 0300  NA  --   < > 134* 134* 136* 139 136*  K  --   < > 4.9 4.9 4.7 4.4 4.5  CL  --   < > 100 97 97 99 94*  CO2  --   < > 21 26 28 30 30   GLUCOSE  --   < > 183*  165* 148* 79 97  BUN  --   < > 50* 51* 55* 55* 56*  CREATININE  --   < > 2.26* 2.44* 2.45* 2.30* 2.23*  CALCIUM  --   < > 8.7 8.8 9.1 9.4 9.4  MG 1.9  --   --   --   --   --   --   PHOS  --   --  3.7 3.9 4.0 3.8 3.3  < > = values in this interval not displayed. Liver Function Tests:  Recent Labs Lab 02/13/14 0030 02/14/14 0438 02/15/14 0928 02/16/14 0454 02/17/14 0535 02/18/14 0300  AST 20  --   --   --   --   --   ALT 16  --   --   --   --   --   ALKPHOS 73  --   --   --   --   --   BILITOT 0.4  --   --   --   --   --   PROT 6.8  --   --   --   --   --  ALBUMIN 2.8* 3.0* 3.0* 3.0* 2.8* 2.8*   CBC:  Recent Labs Lab 02/12/14 1715 02/13/14 0300 02/14/14 0438 02/17/14 0535 02/18/14 0300  WBC 8.1 7.9 7.1 6.9 7.3  HGB 11.1* 9.9* 10.5* 10.8* 10.9*  HCT 34.9* 31.5* 33.7* 35.0* 35.1*  MCV 87.9 87.3 89.4 90.4 89.5  PLT 185 144* 152 109* 119*   Cardiac Enzymes:  Recent Labs Lab 02/13/14 0030 02/13/14 0300 02/13/14 1111  TROPONINI <0.30 <0.30 <0.30   BNP (last 3 results)  Recent Labs  10/02/13 0905 10/30/13 2010 02/12/14 1518  PROBNP 6285.0* 4361.0* 5454.0*   CBG:  Recent Labs Lab 02/17/14 1103 02/17/14 1159 02/17/14 1714 02/17/14 2158 02/18/14 0741  GLUCAP 144* 153* 100* 139* 90    Recent Results (from the past 240 hour(s))  MRSA PCR SCREENING     Status: None   Collection Time    02/13/14  1:20 AM      Result Value Ref Range Status   MRSA by PCR NEGATIVE  NEGATIVE Final   Comment:            The GeneXpert MRSA Assay (FDA     approved for NASAL specimens     only), is one component of a     comprehensive MRSA colonization     surveillance program. It is not     intended to diagnose MRSA     infection nor to guide or     monitor treatment for     MRSA infections.  URINE CULTURE     Status: None   Collection Time    02/13/14  5:40 PM      Result Value Ref Range Status   Specimen Description URINE, CLEAN CATCH   Final   Special Requests  NONE   Final   Culture  Setup Time     Final   Value: 02/14/2014 02:13     Performed at [facility] Count     Final   Value: 50,000 COLONIES/ML     Performed at [facility]   Culture     Final   Value: YEAST     Performed at [facility]   Report Status 02/15/2014 FINAL   Final     Studies:   Time spent : 35 min  Imogene Burn, [place] [facility] Pager: [phone]  If 7PM-7AM, please contact night-coverage [email or web address] Password [email or web address] 02/18/2014, 12:05 PM   LOS: 6 days   Attending Patient was seen, examined,treatment plan was discussed with the Physician extender. I have directly reviewed the clinical findings, lab, imaging studies and management of this patient in detail. I have made the necessary changes to the above noted documentation, and agree with the documentation, as recorded by the Physician extender.  Nena Alexander MD [facility].

## 2014-02-18 NOTE — Progress Notes (Signed)
Physical Therapy Treatment Patient Details Name: [name] MRN: [id number] DOB: 01/26/1941 Today's Date: 02/18/2014    History of Present Illness Patient is a 73 yo female presented to the hospital on 9/10 with complaints of SOB and CP.  In the ER was found to be in Afib with a rapid rate- imaging showed a right sided pleural effusion with consolidation    PT Comments    Patient progressing slowly with mobility. Continues to fatigue easily and demonstrates difficulty maintaining Sa02 and HR in normal ranges with minimal activity. Requires longer rest breaks for recovery. Instructed pt in pursed lip breathing which improved 02 saturation. Very cooperative and motivated. Continues to exhibit poor cardiorespiratory status and would benefit from continued therapy while in hospital. Will follow.   Follow Up Recommendations  SNF;Supervision/Assistance - 24 hour     Equipment Recommendations  Other (comment) (defer to SNF.)    Recommendations for Other Services       Precautions / Restrictions Precautions Precautions: Fall Precaution Comments: monitor 02 Restrictions Weight Bearing Restrictions: No    Mobility  Bed Mobility               General bed mobility comments: Received sitting in chair upon PT arrival.   Transfers Overall transfer level: Needs assistance Equipment used: Rolling walker (2 wheeled) Transfers: Sit to/from Stand Sit to Stand: Min guard         General transfer comment: Use of body momentum to stand. VC's for hand placement. Able to stand from recliner and low bed without physical assist.  Ambulation/Gait Ambulation/Gait assistance: Min guard Ambulation Distance (Feet): 16 Feet (x2 bouts with seated rest break.) Assistive device: Rolling walker (2 wheeled) Gait Pattern/deviations: Step-through pattern;Decreased stride length;Shuffle;Trunk flexed Gait velocity: decreased   General Gait Details: VC's for RW management. Ambulated on 3L 02 Wanda.  Dyspnea present. HR increased to 135 bpm on first bout and 121 bpm second bout. Sa02 dropped to 87%, however difficult to obtain accurate reading due to poor circulation. VC for pursed lip breating.   Stairs            Wheelchair Mobility    Modified Rankin (Stroke Patients Only)       Balance Overall balance assessment: Needs assistance   Sitting balance-Leahy Scale: Fair     Standing balance support: During functional activity   Standing balance comment: Requires UE support on RW for safety.                    Cognition Arousal/Alertness: Awake/alert Behavior During Therapy: WFL for tasks assessed/performed Overall Cognitive Status: Within Functional Limits for tasks assessed                      Exercises General Exercises - Lower Extremity Ankle Circles/Pumps: Both;10 reps;Seated Long Arc Quad: Both;10 reps;Seated (x2 sets) Straight Leg Raises: Both;10 reps;Seated Hip Flexion/Marching: Both;10 reps;Seated    General Comments        Pertinent Vitals/Pain Pain Assessment: No/denies pain    Home Living                      Prior Function            PT Goals (current goals can now be found in the care plan section) Progress towards PT goals: Progressing toward goals    Frequency       PT Plan Current plan remains appropriate    Co-evaluation  End of Session Equipment Utilized During Treatment: Gait belt;Oxygen Activity Tolerance: Patient limited by fatigue;Other (comment) (Limited due to elevated HR) Patient left: in chair;with call bell/phone within reach;with chair alarm set;with nursing/sitter in room     Time: 1124-1150 PT Time Calculation (min): 26 min  Charges:  $Gait Training: 8-22 mins $Therapeutic Activity: 8-22 mins                    G CodesCandy Weeks A 2014/03/20, 12:05 PM [name], [name], DPT [phone]

## 2014-02-18 NOTE — Progress Notes (Signed)
ANTICOAGULATION CONSULT NOTE - Follow Up Consult  Pharmacy Consult for Couamdin Indication: atrial fibrillation  Allergies  Allergen Reactions  . Codeine Other (See Comments)    Childhood allergy    Patient Measurements: Height: 5\' 3"  (160 cm) Weight: 264 lb 12.8 oz (120.112 kg) IBW/kg (Calculated) : 52.4  Vital Signs: Temp: 98.6 F (37 C) (09/16 1055) Temp src: Axillary (09/16 1055) BP: 142/83 mmHg (09/16 1055) Pulse Rate: 85 (09/16 1055)  Labs:  Recent Labs  02/16/14 0454 02/17/14 0535 02/18/14 0300  HGB  --  10.8* 10.9*  HCT  --  35.0* 35.1*  PLT  --  109* 119*  LABPROT 29.8* 34.2* 35.3*  INR 2.84* 3.38* 3.52*  CREATININE 2.45* 2.30* 2.23*    Estimated Creatinine Clearance: 28.6 ml/min (by C-G formula based on Cr of 2.23).  Assessment:   Home Coumadin regimen was 7.5 mg daily.    INR was subtherapeutic (1.67) on admit 9/10 pm and extra 5 mg dose given that night. Coumadin 10 mg given on 9/11  and 9/12.  INR back into target range (2.40) on 9/13 and usual dose of 7.5 mg given.  Coumadin dose decreased to 5 mg on 9/14 (INR 2.84).  Coumadin held on 9/15 when INR up to 3.38; INR up to 3.52 today.  No bleeding noted.    Platelet count 185 on admit, trended down to 109K, 119K today.   IV iron supplementation completed on 9/15. On oral iron daily. Weekly sq Aranesp given on 9/12, due on 02/21/14.  Goal of Therapy:  INR 2-3 Monitor platelets by anticoagulation protocol: Yes   Plan:   Hold Coumadin again today.  Continue daily PT/INR.  Intermittent CBC.   Arty Baumgartner, [place] Pager: [phone] 02/18/2014,11:27 AM

## 2014-02-19 LAB — RENAL FUNCTION PANEL
ANION GAP: 10 (ref 5–15)
Albumin: 2.8 g/dL — ABNORMAL LOW (ref 3.5–5.2)
BUN: 53 mg/dL — ABNORMAL HIGH (ref 6–23)
CHLORIDE: 93 meq/L — AB (ref 96–112)
CO2: 33 mEq/L — ABNORMAL HIGH (ref 19–32)
Calcium: 9.1 mg/dL (ref 8.4–10.5)
Creatinine, Ser: 2.08 mg/dL — ABNORMAL HIGH (ref 0.50–1.10)
GFR calc non Af Amer: 23 mL/min — ABNORMAL LOW (ref >90)
GFR, EST AFRICAN AMERICAN: 26 mL/min — AB (ref >90)
Glucose, Bld: 80 mg/dL (ref 70–99)
PHOSPHORUS: 3.6 mg/dL (ref 2.3–4.6)
POTASSIUM: 4.4 meq/L (ref 3.7–5.3)
Sodium: 136 mEq/L — ABNORMAL LOW (ref 137–147)

## 2014-02-19 LAB — GLUCOSE, CAPILLARY
GLUCOSE-CAPILLARY: 155 mg/dL — AB (ref 70–99)
Glucose-Capillary: 81 mg/dL (ref 70–99)
Glucose-Capillary: 175 mg/dL — ABNORMAL HIGH (ref 70–99)
Glucose-Capillary: 161 mg/dL — ABNORMAL HIGH (ref 70–99)

## 2014-02-19 LAB — PROTIME-INR
INR: 2.53 — ABNORMAL HIGH (ref 0.00–1.49)
PROTHROMBIN TIME: 27.3 s — AB (ref 11.6–15.2)

## 2014-02-19 MED ORDER — INSULIN ASPART 100 UNIT/ML ~~LOC~~ SOLN: 0.0000 [IU] | Freq: Three times a day (TID) | SUBCUTANEOUS | Status: DC

## 2014-02-19 MED ORDER — TRAMADOL HCL 50 MG PO TABS: 50.0000 mg | ORAL_TABLET | Freq: Four times a day (QID) | ORAL | Status: DC | PRN

## 2014-02-19 MED ORDER — INSULIN GLARGINE 100 UNIT/ML ~~LOC~~ SOLN: 15.0000 [IU] | Freq: Every day | SUBCUTANEOUS | Status: AC

## 2014-02-19 MED ORDER — INSULIN ASPART 100 UNIT/ML ~~LOC~~ SOLN: 5.0000 [IU] | Freq: Three times a day (TID) | SUBCUTANEOUS | Status: DC

## 2014-02-19 MED ORDER — CYANOCOBALAMIN 500 MCG PO TABS: 500.0000 ug | ORAL_TABLET | Freq: Every day | ORAL | Status: AC

## 2014-02-19 MED ORDER — NYSTATIN 100000 UNIT/GM EX POWD: 1.0000 g | Freq: Two times a day (BID) | CUTANEOUS | Status: AC

## 2014-02-19 MED ORDER — POLYETHYLENE GLYCOL 3350 17 G PO PACK: 17.0000 g | PACK | Freq: Every day | ORAL | Status: AC

## 2014-02-19 MED ORDER — DARBEPOETIN ALFA-POLYSORBATE 60 MCG/0.3ML IJ SOLN: 60.0000 ug | INTRAMUSCULAR | Status: AC

## 2014-02-19 MED ORDER — ROPINIROLE HCL 0.5 MG PO TABS: 0.5000 mg | ORAL_TABLET | Freq: Three times a day (TID) | ORAL | Status: AC

## 2014-02-19 MED ORDER — WARFARIN SODIUM 5 MG PO TABS
5.0000 mg | ORAL_TABLET | Freq: Once | ORAL | Status: AC
  Administered 2014-02-19: 5 mg via ORAL
  Filled 2014-02-19: qty 1

## 2014-02-19 MED ORDER — HYDROCODONE-ACETAMINOPHEN 5-325 MG PO TABS: 1.0000 | ORAL_TABLET | Freq: Four times a day (QID) | ORAL | Status: AC | PRN

## 2014-02-19 MED ORDER — FUROSEMIDE 40 MG PO TABS: 120.0000 mg | ORAL_TABLET | Freq: Two times a day (BID) | ORAL | Status: AC

## 2014-02-19 MED ORDER — FUROSEMIDE 40 MG PO TABS
120.0000 mg | ORAL_TABLET | Freq: Two times a day (BID) | ORAL | Status: DC
  Administered 2014-02-19 – 2014-02-20 (×2): 120 mg via ORAL
  Filled 2014-02-19 (×4): qty 1

## 2014-02-19 MED ORDER — INSULIN ASPART 100 UNIT/ML ~~LOC~~ SOLN: 0.0000 [IU] | Freq: Every day | SUBCUTANEOUS | Status: DC

## 2014-02-19 NOTE — Progress Notes (Signed)
[facility] called that pt would not arrive tonight. Spoke with Lelan Pons.

## 2014-02-19 NOTE — Clinical Social Work Note (Signed)
Only SNF bed offer is at [facility] and [facility]- the family and patient are agreeable to this plan and we will plan transfer via EMS. They understand that [facility] which is her preference will attempt to take her as soon as they have a bed available.   Eduard Clos, MSW, [place]

## 2014-02-19 NOTE — Discharge Summary (Signed)
Physician Discharge Summary  TAL NEER UXL:[id number] DOB: 1940-06-20 DOA: 02/12/2014  PCP: PROVIDER NOT IN SYSTEM  Admit date: 02/12/2014 Discharge date: 02/19/2014  Time spent: 45 minutes  Recommendations for Outpatient Follow-up:  1. Renal Panel on Monday 9/21 and again on Thursday 9/24 to evaluate renal function and electrolytes. 2. INR check in 1 week. 3. Weigh daily  4. Nephrology follow up to be arranged with [facility] (Per Dr. Sharyne Richters) 5. Discharging to SNF for PT / OT strengthening and medication management.   Discharge Diagnoses:  Principal Problem:   Atrial fibrillation with RVR Active Problems:   Chronic kidney disease (CKD), stage V   Right heart failure   Pulmonary HTN   Morbid obesity   DM type 2, uncontrolled, with neuropathy   Restless leg   Acute respiratory failure with hypoxia   Essential (primary) hypertension   DKA (diabetic ketoacidoses)   Hyperkalemia   Discharge Condition: stable   Diet recommendation: Heart healthy  History of present illness:  [name] is a 73 y.o. female with history of atrial fibrillation, CHF, diabetes mellitus, chronic kidney disease stage IV, anemia.  She was referred to the ER by her PCP after complaining of chest pain and shortness of breath since waking up in the morning.  Patient also was noticed to have increased blood sugar. Patient states she has gained weight and also has increased lower extremity edema.  In the ER patient was found to be in A. fib with RVR.  CBG was was found to be elevated with anion gap and was started on IV insulin for possible DKA. She received Lasix 40 mg IV for CHF and was started on Esmolol infusion for A. fib with RVR. Chest x-ray shows right-sided opacity and CT chest was recommended.    Hospital Course:   Atrial fibrillation with RVR/chronic a fib  Patient initially required a cardizem drip.  HR improved and stabilized. The drip was discontinued and she was started on  metoprolol 25 mg bid. She will be discharged on her previous home dose of carvedilol 25 mg. Coumadin was managed by pharmacy.  INR is therapeutic.  Acute on chronic respiratory failure with hypoxia/diastolic heart failure/ Pulmonary HTN  Was massively fluid overloaded.   Nephrology was consulted.   Patient received IV lasix q 6 hours and diuresed well. Per I/Os down 9L since admission. Nephrology changed her lasix dosing from IV to PO on 9/16.   She will be discharged on 120 mg PO bid.  She will need a renal panel in approximately 4 days and nephrology follow up.  Chronic kidney disease (CKD), stage V/Hyperkalemia  Baseline creatinine 2-2.3.  Not a good candidate for long term HD.  Creatinine trended down over the course of the hospitalization despite lasix therapy. Creatinine at d/c  2.08 Nephrology followed the patient throughout her hospitalization.  DM type 2, uncontrolled, with neuropathy  CBGs better.  Lantus and novolog.  Will discharge on Lantus and novolog with meal coverage and SSI to [place].  Pleural effusion chronic  Breathing comfortably on O2 (uses 2-3L at home)   Morbid obesity   Restless leg  Probably more reflective of underlying diabetic neuropathy as well as pain secondary to significant lower extremity edema  low-dose Requip added to see if helpful   Essential (primary) hypertension  Controlled.  Carvedilol with high dose lasix.  Abnormal U/A  Urine culture - Yeast 50,000 colonies.  Received 1 dose of diflucan.   Consultations:  nephrology  Discharge Exam: Filed Vitals:   02/19/14 0205 02/19/14 0500 02/19/14 0724 02/19/14 1418  BP: 143/81 133/61  166/72  Pulse: 71 94  98  Temp: 97.5 F (36.4 C) 97.6 F (36.4 C)  98.5 F (36.9 C)  TempSrc: Oral Oral  Oral  Resp: 18 18  20   Height:      Weight:      SpO2: 98% 96% 99% 95%    Filed Weights   02/17/14 0548 02/18/14 0227 02/19/14 0154  Weight: 119.523 kg (263 lb 8 oz)  120.112 kg (264 lb 12.8 oz) 119.432 kg (263 lb 4.8 oz)   Gen: alert, chronically ill appearing, obese. Sitting in chair. O2 at 2.5 liters.  Pleasant. Chest: CTA without WRR. Decreased breath sounds.  Cardiac: irreg irreg. Without MGR  Abdomen: obese, s, nt, nd, +BS, no organomegaly. Extremities: Lower extremities with 1+ edema bilaterally, light pink in color    Discharge Instructions   Discharge Instructions   Diet - low sodium heart healthy    Complete by:  As directed      Diet - low sodium heart healthy    Complete by:  As directed      Increase activity slowly    Complete by:  As directed      Increase activity slowly    Complete by:  As directed           Current Discharge Medication List    START taking these medications   Details  !! cyanocobalamin 500 MCG tablet Take 1 tablet (500 mcg total) by mouth daily.    darbepoetin (ARANESP) 60 MCG/0.3ML SOLN injection Inject 0.3 mLs (60 mcg total) into the skin every Saturday at 6 PM. Qty: 4.2 mL, Refills: 0    nystatin (MYCOSTATIN/NYSTOP) 100000 UNIT/GM POWD Apply 1 g topically 2 (two) times daily. Refills: 0    polyethylene glycol (MIRALAX / GLYCOLAX) packet Take 17 g by mouth daily. Qty: 14 each, Refills: 0    rOPINIRole (REQUIP) 0.5 MG tablet Take 1 tablet (0.5 mg total) by mouth 3 (three) times daily.     !! - Potential duplicate medications found. Please discuss with provider.    CONTINUE these medications which have CHANGED   Details  furosemide (LASIX) 40 MG tablet Take 3 tablets (120 mg total) by mouth 2 (two) times daily. Qty: 30 tablet, Refills: 0    HYDROcodone-acetaminophen (NORCO/VICODIN) 5-325 MG per tablet Take 1 tablet by mouth every 6 (six) hours as needed for moderate pain. Qty: 30 tablet, Refills: 0    !! insulin aspart (NOVOLOG) 100 UNIT/ML injection Inject 5 Units into the skin 3 (three) times daily with meals. Qty: 10 mL, Refills: 11    !! insulin aspart (NOVOLOG) 100 UNIT/ML injection  Inject 0-15 Units into the skin 3 (three) times daily with meals. Qty: 10 mL, Refills: 11    !! insulin aspart (NOVOLOG) 100 UNIT/ML injection Inject 0-5 Units into the skin at bedtime. Qty: 10 mL, Refills: 11    insulin glargine (LANTUS) 100 UNIT/ML injection Inject 0.15 mLs (15 Units total) into the skin at bedtime. Qty: 10 mL, Refills: 11    traMADol (ULTRAM) 50 MG tablet Take 1 tablet (50 mg total) by mouth every 6 (six) hours as needed for moderate pain. Qty: 30 tablet, Refills: 0     !! - Potential duplicate medications found. Please discuss with provider.    CONTINUE these medications which have NOT CHANGED   Details  albuterol (PROVENTIL HFA;VENTOLIN HFA)  108 (90 BASE) MCG/ACT inhaler Inhale 2 puffs into the lungs every 6 (six) hours as needed for wheezing or shortness of breath.    albuterol (PROVENTIL) (2.5 MG/3ML) 0.083% nebulizer solution Take 2.5 mg by nebulization 4 (four) times daily.     aluminum & magnesium hydroxide-simethicone (MYLANTA) 500-450-40 MG/5ML suspension Take 15 mLs by mouth every 2 (two) hours as needed for indigestion.    aspirin EC 81 MG tablet Take 81 mg by mouth daily.    atorvastatin (LIPITOR) 40 MG tablet Take 40 mg by mouth daily.    Calcium 500 MG tablet Take 500 mg by mouth 3 (three) times daily.    calcium-vitamin D (OSCAL WITH D) 500-200 MG-UNIT per tablet Take 1 tablet by mouth 3 (three) times daily.    carvedilol (COREG) 25 MG tablet Take 1 tablet (25 mg total) by mouth 2 (two) times daily with a meal. Qty: 60 tablet, Refills: 6    Cholecalciferol (VITAMIN D-3) 1000 UNITS CAPS Take 1 capsule by mouth daily.    diphenhydrAMINE (BENADRYL) 25 mg capsule Take 25 mg by mouth every 4 (four) hours as needed for itching.    docusate sodium (COLACE) 100 MG capsule Take 100 mg by mouth every other day.    ferrous sulfate 325 (65 FE) MG tablet Take 325 mg by mouth daily with breakfast.     Fluticasone-Salmeterol (ADVAIR) 250-50 MCG/DOSE AEPB  Inhale 1 puff into the lungs 2 (two) times daily.    gabapentin (NEURONTIN) 100 MG capsule Take 100 mg by mouth 3 (three) times daily.    nitroGLYCERIN (NITROSTAT) 0.4 MG SL tablet Place 1 mg under the tongue every 5 (five) minutes x 3 doses as needed for chest pain. For chest pain    Omega-3 Fatty Acids (FISH OIL) 1000 MG CAPS Take 1 capsule by mouth 3 (three) times daily.    !! vitamin B-12 (CYANOCOBALAMIN) 500 MCG tablet Take 500 mcg by mouth daily.    warfarin (COUMADIN) 5 MG tablet Take 7.5 mg by mouth daily.     !! - Potential duplicate medications found. Please discuss with provider.     Allergies  Allergen Reactions  . Codeine Other (See Comments)    Childhood allergy   Follow-up Information   Follow up with PROVIDER NOT IN SYSTEM.       The results of significant diagnostics from this hospitalization (including imaging, microbiology, ancillary and laboratory) are listed below for reference.    Significant Diagnostic Studies: Dg Chest 2 View  02/12/2014   CLINICAL DATA:  Shortness of breath.  Chest pain.  EXAM: CHEST  2 VIEW  COMPARISON:  One-view chest 02/04/2014.  FINDINGS: The heart is enlarged. A persistent scratch the persistent right lower lobe airspace disease and effusion is present. Lung volumes are low. Previously seen edema and pulmonary vascular congestion has resolved. Right hemidiaphragm may be elevated. There are some air bronchograms into the right lower lobe as well.  IMPRESSION: 1. Persistent right lower lobe opacification. Recommend CT the chest with contrast for further evaluation of pneumonia or potential underlying mass. 2. Cardiomegaly without failure.   Electronically Signed   By: Lawrence Santiago M.D.   On: 02/12/2014 16:44   Ct Chest Wo Contrast  02/12/2014   CLINICAL DATA:  Abnormal chest x-ray  EXAM: CT CHEST WITHOUT CONTRAST  TECHNIQUE: Multidetector CT imaging of the chest was performed following the standard protocol without IV contrast.   COMPARISON:  Chest x-ray from earlier in the same day  FINDINGS:  The left lung is well aerated without focal infiltrate or sizable effusion. A large pleural effusion is noted on the right. Associated basilar infiltrate is noted. No definitive mass lesion is seen although it would be difficult to exclude a mass within the consolidated right lower lobe. No definitive hilar or mediastinal adenopathy is seen. Aortic calcifications are noted. Heavy coronary calcifications are seen. Visualized upper abdomen shows a vague hypodensity within the right lobe of the liver. Ultrasound may be helpful for further evaluation. The lack of IV contrast limits this exam. Small amount of ascites is seen. No acute bony abnormality is seen.  IMPRESSION: Large right-sided pleural effusion with right lower lobe consolidation. No definitive mass lesion is noted although a small lesion could not be totally excluded on the basis of this exam.  Vague hypodensity within the in the right lobe of the liver laterally. Ultrasound may be helpful for further evaluation.   Electronically Signed   By: Inez Catalina M.D.   On: 02/12/2014 21:33   Dg Chest Port 1 View  02/15/2014   CLINICAL DATA:  Confirm line placement  EXAM: PORTABLE CHEST - 1 VIEW  COMPARISON:  02/12/2014  FINDINGS: Right arm PICC line tip is identified with tip in the cavoatrial junction. The heart size is moderately enlarged. There is a large right pleural effusion. Atelectasis is noted within the right midlung and left base.  IMPRESSION: 1. Tip of right arm PICC line is in the cavoatrial junction. 2. No change in right pleural effusion and atelectasis   Electronically Signed   By: Kerby Moors M.D.   On: 02/15/2014 13:47    Microbiology: Recent Results (from the past 240 hour(s))  MRSA PCR SCREENING     Status: None   Collection Time    02/13/14  1:20 AM      Result Value Ref Range Status   MRSA by PCR NEGATIVE  NEGATIVE Final   Comment:            The GeneXpert MRSA  Assay (FDA     approved for NASAL specimens     only), is one component of a     comprehensive MRSA colonization     surveillance program. It is not     intended to diagnose MRSA     infection nor to guide or     monitor treatment for     MRSA infections.  URINE CULTURE     Status: None   Collection Time    02/13/14  5:40 PM      Result Value Ref Range Status   Specimen Description URINE, CLEAN CATCH   Final   Special Requests NONE   Final   Culture  Setup Time     Final   Value: 02/14/2014 02:13     Performed at [facility]     Final   Value: 50,000 COLONIES/ML     Performed at [facility]   Culture     Final   Value: YEAST     Performed at [facility]   Report Status 02/15/2014 FINAL   Final     Labs: Basic Metabolic Panel:  Recent Labs Lab 02/12/14 1726  02/15/14 0928 02/16/14 0454 02/17/14 0535 02/18/14 0300 02/19/14 0521  NA  --   < > 134* 136* 139 136* 136*  K  --   < > 4.9 4.7 4.4 4.5 4.4  CL  --   < > 97 97  99 94* 93*  CO2  --   < > 26 28 30 30  33*  GLUCOSE  --   < > 165* 148* 79 97 80  BUN  --   < > 51* 55* 55* 56* 53*  CREATININE  --   < > 2.44* 2.45* 2.30* 2.23* 2.08*  CALCIUM  --   < > 8.8 9.1 9.4 9.4 9.1  MG 1.9  --   --   --   --   --   --   PHOS  --   < > 3.9 4.0 3.8 3.3 3.6  < > = values in this interval not displayed. Liver Function Tests:  Recent Labs Lab 02/13/14 0030  02/15/14 0928 02/16/14 0454 02/17/14 0535 02/18/14 0300 02/19/14 0521  AST 20  --   --   --   --   --   --   ALT 16  --   --   --   --   --   --   ALKPHOS 73  --   --   --   --   --   --   BILITOT 0.4  --   --   --   --   --   --   PROT 6.8  --   --   --   --   --   --   ALBUMIN 2.8*  < > 3.0* 3.0* 2.8* 2.8* 2.8*  < > = values in this interval not displayed.  CBC:  Recent Labs Lab 02/12/14 1715 02/13/14 0300 02/14/14 0438 02/17/14 0535 02/18/14 0300  WBC 8.1 7.9 7.1 6.9 7.3  HGB 11.1* 9.9* 10.5* 10.8* 10.9*  HCT  34.9* 31.5* 33.7* 35.0* 35.1*  MCV 87.9 87.3 89.4 90.4 89.5  PLT 185 144* 152 109* 119*   Cardiac Enzymes:  Recent Labs Lab 02/13/14 0030 02/13/14 0300 02/13/14 1111  TROPONINI <0.30 <0.30 <0.30   BNP: BNP (last 3 results)  Recent Labs  10/02/13 0905 10/30/13 2010 02/12/14 1518  PROBNP 6285.0* 4361.0* 5454.0*   CBG:  Recent Labs Lab 02/18/14 1152 02/18/14 1654 02/18/14 2158 02/19/14 0743 02/19/14 1155  GLUCAP 145* 96 83 81 175*       Signed:  Karen Kitchens [phone]  [facility] 02/19/2014, 3:19 PM    Attending  Patient was seen, examined,treatment plan was discussed with the Physician extender. I have directly reviewed the clinical findings, lab, imaging studies and management of this patient in detail. I have made the necessary changes to the above noted documentation, and agree with the documentation, as recorded by the Physician extender.  Phillips Climes MD  [facility].

## 2014-02-19 NOTE — Progress Notes (Signed)
DC by ambulace at this time

## 2014-02-19 NOTE — Progress Notes (Signed)
FL2 printed and placed on pt's chart for MD signature.  Unit CSW will be informed of pt's d/c in the am and need to facilitate d/c to NH.

## 2014-02-19 NOTE — Progress Notes (Signed)
[facility] Rounding Note   Subjective: Pt seen and examined in AM. No acute events overnight. Pt reports her breathing and lower extremity swelling are much better. She is making good urine output and still has a foley catheter. She was transitioned to PO lasix yesterday.   Objective:    Vital signs in last 24 hours: Filed Vitals:   02/19/14 0154 02/19/14 0205 02/19/14 0500 02/19/14 0724  BP:  143/81 133/61   Pulse:  71 94   Temp:  97.5 F (36.4 C) 97.6 F (36.4 C)   TempSrc:  Oral Oral   Resp:  18 18   Height:      Weight: 263 lb 4.8 oz (119.432 kg)     SpO2:  98% 96% 99%   Weight change: -1 lb 8 oz (-0.68 kg)  Intake/Output Summary (Last 24 hours) at 02/19/14 0814 Last data filed at 02/19/14 0700  Gross per 24 hour  Intake    370 ml  Output   5100 ml  Net  -4730 ml    Physical Exam:  Blood pressure 133/61, pulse 94, temperature 97.6 F (36.4 C), temperature source Oral, resp. rate 18, height 5\' 3"  (1.6 m), weight 263 lb 4.8 oz (119.432 kg), SpO2 99.00%. General: Resting comfortably in bed, in no acute distress Cardiac: Irregularly irregular rhythm with normal rate Pulmonary: Decreased breath sounds. Clear to auscultation bilaterally with no wheezing, ronchi, or rales Abdomen: Soft, non-tender, non-distended with normal BS Extremities: +2 bilateral pitting LE edema with mild erythema     Recent Labs Lab 02/13/14 1138 02/14/14 0438 02/15/14 0928 02/16/14 0454 02/17/14 0535 02/18/14 0300 02/19/14 0521  NA 135* 134* 134* 136* 139 136* 136*  K 5.4* 4.9 4.9 4.7 4.4 4.5 4.4  CL 99 100 97 97 99 94* 93*  CO2 23 21 26 28 30 30  33*  GLUCOSE 186* 183* 165* 148* 79 97 80  BUN 49* 50* 51* 55* 55* 56* 53*  CREATININE 2.12* 2.26* 2.44* 2.45* 2.30* 2.23* 2.08*  CALCIUM 8.7 8.7 8.8 9.1 9.4 9.4 9.1  PHOS  --  3.7 3.9 4.0 3.8 3.3 3.6     Recent Labs Lab 02/13/14 0030  02/17/14 0535 02/18/14 0300 02/19/14 0521  AST 20  --   --   --   --   ALT 16  --    --   --   --   ALKPHOS 73  --   --   --   --   BILITOT 0.4  --   --   --   --   PROT 6.8  --   --   --   --   ALBUMIN 2.8*  < > 2.8* 2.8* 2.8*  < > = values in this interval not displayed. No results found for this basename: LIPASE, AMYLASE,  in the last 168 hours No results found for this basename: AMMONIA,  in the last 168 hours   Recent Labs Lab 02/13/14 0300 02/14/14 0438 02/17/14 0535 02/18/14 0300  WBC 7.9 7.1 6.9 7.3  HGB 9.9* 10.5* 10.8* 10.9*  HCT 31.5* 33.7* 35.0* 35.1*  MCV 87.3 89.4 90.4 89.5  PLT 144* 152 109* 119*      ) Recent Labs Lab 02/13/14 0030 02/13/14 0300 02/13/14 1111  TROPONINI <0.30 <0.30 <0.30     Recent Labs Lab 02/18/14 0741 02/18/14 1152 02/18/14 1654 02/18/14 2158 02/19/14 0743  GLUCAP 90 145* 96 83 81      Recent Labs Lab 02/14/14 0438  IRON 50  TIBC 292  FERRITIN 99    Studies/Results: No results found. Medications   . aspirin EC  81 mg Oral Daily  . atorvastatin  40 mg Oral QPM  . calcitRIOL  0.25 mcg Oral Daily  . calcium carbonate  1 tablet Oral TID WC  . vitamin B-12  500 mcg Oral Daily  . darbepoetin (ARANESP) injection - NON-DIALYSIS  60 mcg Subcutaneous Q Sat-1800  . docusate sodium  100 mg Oral BID  . ferric gluconate (FERRLECIT/NULECIT) IV  125 mg Intravenous Daily  . ferrous sulfate  325 mg Oral Q breakfast  . furosemide  160 mg Oral BID  . gabapentin  100 mg Oral TID  . insulin aspart  0-15 Units Subcutaneous TID WC  . insulin aspart  0-5 Units Subcutaneous QHS  . insulin aspart  5 Units Subcutaneous TID WC  . insulin glargine  15 Units Subcutaneous QHS  . metoprolol tartrate  25 mg Oral BID  . mometasone-formoterol  2 puff Inhalation BID  . nystatin   Topical BID  . omega-3 acid ethyl esters  1 g Oral TID  . polyethylene glycol  17 g Oral Daily  . rOPINIRole  0.5 mg Oral TID  . Warfarin - Pharmacist Dosing Inpatient   Does not apply q1800     ASSESSMENT/RECOMMENDATIONS  Assessment: [name] is 73 yo pleasant woman with pmh of HTN, HL, T2DM, CAD, COPD on home 2L O2, and CKD Stage IV who presented on 9/10 with dyspnea due to  volume overload found to have afib with RVR who we were consulted for in regards to AKI on CKD Stage IV with Cr peak of 2.45 with baseline of of 2.3.    Plan:  AKI on CKD Stage IV - Cr 2.08 today, down from 2.23 yesterday (peak of 2.45), now at baseline of 2-2.3. Etiology thought to be due to cardiorenal syndrome in setting of cardiac demand form pulmonary HTN and new-onset Afib with RVR. Continue PO furosemide 160 mg BID (current dose for discharge) with close monitoring for metabolic alkalosis (HCO3 33 today - in absence of ABG can't say how much of this could be related to hypercarbia from her underlyuing lung ds). Remove foley catheter. Continue daily weight, strict I/O's, renal function panel, and avoidance of nephrotoxins. Pt is unfortunately not a good future HD candidate.  Hypertension - Currently normotensive. Continue PO furosemide 160 mg BID and metoprolol tartrate 25 mg BID Secondary Hyperparathyroidism - Last PTH level 278, continue daily calcitriol 0.25 mcg daily  Anemia of Chronic Disease - Hg 10.9 near baseline on [facility] therapy. Completed IV ferric gluconate therapy. Continue PO ferrous sulfate 325 mg daily and weekly aranesp injections.  New onset Atrial Fibrillation - Currently with normal rate and irregular rhythm. Management per primary team. Continue coumadin per pharmacy for [facility] therapy, INR therapeutic. (2.53). Continue metoprolol tartrate 25 mg BID for rate control.  Juluis Mire, PGY-II IMTS Pager [phone]  I have seen and examined this patient and agree with plan as outlined in the above note.  Renal function has returned to baseline and diuretic responsiveness improving (5 liters out yesterday despite a scaled down diuretic regimen). Unclear what best outpt dose will be, but would like to reduce a little to 120 BID and within a week  after discharge to SNF she should have a renal panel done to evaluate kidney function.  She should be weighed at least twice a week at the SNF.  I will arrange  for future nephrology followup with me.  Jennelle Pinkstaff B,MD 02/19/2014 12:26 PM

## 2014-02-19 NOTE — Progress Notes (Signed)
Report called to [place] and spoke with Caryl Pina. Report given, no questions ask.

## 2014-02-19 NOTE — Progress Notes (Signed)
ANTICOAGULATION CONSULT NOTE - Follow Up Consult  Pharmacy Consult for Couamdin Indication: atrial fibrillation  Allergies  Allergen Reactions  . Codeine Other (See Comments)    Childhood allergy    Patient Measurements: Height: 5\' 3"  (160 cm) Weight: 263 lb 4.8 oz (119.432 kg) IBW/kg (Calculated) : 52.4  Vital Signs: Temp: 97.6 F (36.4 C) (09/17 0500) Temp src: Oral (09/17 0500) BP: 133/61 mmHg (09/17 0500) Pulse Rate: 94 (09/17 0500)  Labs:  Recent Labs  02/17/14 0535 02/18/14 0300 02/19/14 0521  HGB 10.8* 10.9*  --   HCT 35.0* 35.1*  --   PLT 109* 119*  --   LABPROT 34.2* 35.3* 27.3*  INR 3.38* 3.52* 2.53*  CREATININE 2.30* 2.23* 2.08*    Estimated Creatinine Clearance: 30.6 ml/min (by C-G formula based on Cr of 2.08).  Assessment: 29 YOF with history of AFib on warfarin PTA- home dose was 7.5mg  daily. Her admission INR on 9/10 was low and she received an extra dose that evening. INR made a drastic increase and has been held the past 2 evenings. INR this morning back in therapeutic range at 2.53. Her Hgb is 10.9, plts 119. No bleeding noted. Currently receiving iron and Aranesp for anemia.  Goal of Therapy:  INR 2-3 Monitor platelets by anticoagulation protocol: Yes   Plan:  1. Warfarin 5mg  po x1 tonight 2. Daily PT/INR 3. Will follow for d/c planning and will give recommendations for warfarin dosing 4. Follow for s/s bleeding.  Luane Rochon D. Nataleigh Griffin, PharmD, BCPS Clinical Pharmacist Pager: [phone] 02/19/2014 10:54 AM

## 2014-02-20 LAB — RENAL FUNCTION PANEL
Albumin: 3.1 g/dL — ABNORMAL LOW (ref 3.5–5.2)
Anion gap: 13 (ref 5–15)
BUN: 51 mg/dL — ABNORMAL HIGH (ref 6–23)
CALCIUM: 9.4 mg/dL (ref 8.4–10.5)
CO2: 33 meq/L — AB (ref 19–32)
Chloride: 90 mEq/L — ABNORMAL LOW (ref 96–112)
Creatinine, Ser: 1.92 mg/dL — ABNORMAL HIGH (ref 0.50–1.10)
GFR calc Af Amer: 29 mL/min — ABNORMAL LOW (ref >90)
GFR calc non Af Amer: 25 mL/min — ABNORMAL LOW (ref >90)
GLUCOSE: 126 mg/dL — AB (ref 70–99)
PHOSPHORUS: 3.3 mg/dL (ref 2.3–4.6)
Potassium: 4.4 mEq/L (ref 3.7–5.3)
Sodium: 136 mEq/L — ABNORMAL LOW (ref 137–147)

## 2014-02-20 LAB — PROTIME-INR
INR: 2 — AB (ref 0.00–1.49)
Prothrombin Time: 22.7 seconds — ABNORMAL HIGH (ref 11.6–15.2)

## 2014-02-20 LAB — GLUCOSE, CAPILLARY: GLUCOSE-CAPILLARY: 124 mg/dL — AB (ref 70–99)

## 2014-02-20 MED ORDER — METOPROLOL TARTRATE 50 MG PO TABS
50.0000 mg | ORAL_TABLET | Freq: Two times a day (BID) | ORAL | Status: DC
  Administered 2014-02-20: 50 mg via ORAL
  Filled 2014-02-20 (×2): qty 1

## 2014-02-20 NOTE — Progress Notes (Signed)
CSW (Clinical Education officer, museum) prepared pt dc packet and placed with shadow chart. CSW arranged non-emergent ambulance transport for 11:30am as requested by pt nurse. Pt, pt nurse, and facility informed. Pt declined for CSW to call family and stated she would do this on her own. CSW signing off.  [place], [place]

## 2014-02-20 NOTE — Progress Notes (Signed)
PT DC AT THIS TIME BY AMBULANCE. [facility] NOTIFIED.

## 2014-02-20 NOTE — Progress Notes (Signed)
Physical Therapy Treatment Patient Details Name: [name] MRN: [id number] DOB: 05-28-1941 Today's Date: 02/20/2014    History of Present Illness Patient is a 73 yo female presented to the hospital on 9/10 with complaints of SOB and CP.  In the ER was found to be in Afib with a rapid rate- imaging showed a right sided pleural effusion with consolidation    PT Comments    Pt very motivated to participate in therapy. Pt (A) with peri-care at [facility]; pt unable to perform independently due to balance deficits. Pt with limited activity tolerance but able to progress mobility with standing rest breaks. Cont to recommend SNF for post acute rehab.   Follow Up Recommendations  SNF;Supervision/Assistance - 24 hour     Equipment Recommendations  None recommended by PT    Recommendations for Other Services OT consult     Precautions / Restrictions Precautions Precautions: Fall Precaution Comments: monitor 02 Restrictions Weight Bearing Restrictions: No    Mobility  Bed Mobility               General bed mobility comments: not addressed; pt up on BSC then returned tor recliner   Transfers Overall transfer level: Needs assistance Equipment used: Rolling walker (2 wheeled) Transfers: Sit to/from Stand Sit to Stand: Min guard         General transfer comment: min guard to steady; cues for hand placement and sequencing pt with incr fwd flex trunk with transfers  Ambulation/Gait Ambulation/Gait assistance: Min guard Ambulation Distance (Feet): 20 Feet Assistive device: Rolling walker (2 wheeled) Gait Pattern/deviations: Step-through pattern;Decreased stride length;Trunk flexed;Wide base of support Gait velocity: very decreased Gait velocity interpretation: Below normal speed for age/gender General Gait Details: pt fatigued quickly; required 3 standing rest breaks due to fatigue; min guard to steady and cues for safety; pt removing hands from RW frequently    Stairs             Wheelchair Mobility    Modified Rankin (Stroke Patients Only)       Balance Overall balance assessment: Needs assistance Sitting-balance support: Feet supported;No upper extremity supported Sitting balance-Leahy Scale: Good     Standing balance support: During functional activity;Bilateral upper extremity supported Standing balance-Leahy Scale: Poor Standing balance comment: fwd flex posture and relying on RW                     Cognition Arousal/Alertness: Awake/alert Behavior During Therapy: WFL for tasks assessed/performed Overall Cognitive Status: Within Functional Limits for tasks assessed                      Exercises General Exercises - Lower Extremity Ankle Circles/Pumps: Both;10 reps;Seated Long Arc Quad: Both;10 reps;Seated Hip ABduction/ADduction: AROM;Strengthening;Both;5 reps;Seated Straight Leg Raises: Both;10 reps;Seated Hip Flexion/Marching: Both;10 reps;Seated    General Comments        Pertinent Vitals/Pain Pain Assessment: 0-10 Pain Score: 7  Pain Location: Lt hip Pain Descriptors / Indicators: Throbbing Pain Intervention(s): Ice applied;Repositioned;Monitored during session    Home Living                      Prior Function            PT Goals (current goals can now be found in the care plan section) Acute Rehab PT Goals Patient Stated Goal: to do what i can to get better PT Goal Formulation: With patient Time For Goal Achievement: 02/28/14 Potential to Achieve Goals:  Good Progress towards PT goals: Progressing toward goals    Frequency  Min 3X/week    PT Plan Current plan remains appropriate    Co-evaluation             End of Session Equipment Utilized During Treatment: Gait belt;Oxygen Activity Tolerance: Patient tolerated treatment well Patient left: in chair;with call bell/phone within reach;with chair alarm set     Time: 1031-1057 PT Time Calculation (min): 26 min  Charges:   $Gait Training: 8-22 mins $Therapeutic Exercise: 8-22 mins                    G Codes:      Elie Confer [place], [place] 02/20/2014, 12:46 PM

## 2014-02-20 NOTE — Progress Notes (Signed)
[facility] Rounding Note   Subjective: Pt seen and examined in AM. No acute events overnight. She continues to make good urine output on oral lasix and no longer has foley catheter in place. Her breathing and lower extremity swelling continue to improve. She is to scheduled to go to a SNF this morning.     Objective:    Vital signs in last 24 hours: Filed Vitals:   02/19/14 2103 02/19/14 2224 02/20/14 0008 02/20/14 0438  BP:  145/64 149/97 130/57  Pulse:  85 123 122  Temp:   97.8 F (36.6 C) 97.5 F (36.4 C)  TempSrc:   Oral Oral  Resp:  18 18 18   Height:      Weight:    255 lb 12.8 oz (116.03 kg)  SpO2: 95% 100% 100% 100%   Weight change: -7 lb 8 oz (-3.402 kg) Weight 263 lb yesterday to 255 lb today Weight on 9/10 admission 263 lb   Intake/Output Summary (Last 24 hours) at 02/20/14 0745 Last data filed at 02/20/14 0651  Gross per 24 hour  Intake    800 ml  Output   2900 ml  Net  -2100 ml    Physical Exam:  Blood pressure 130/57, pulse 122, temperature 97.5 F (36.4 C), temperature source Oral, resp. rate 18, height 5\' 3"  (1.6 m), weight 255 lb 12.8 oz (116.03 kg), SpO2 100.00%. General: Resting comfortably in bed, in no acute distress Cardiac: Irregularly irregular rhythm, tachycardic  Pulmonary: Decreased breath sounds. Clear to auscultation bilaterally with no wheezing, ronchi, or rales Abdomen: Soft, non-tender, non-distended with normal BS Extremities: +1 bilateral pitting LE edema up to knees with mild erythema     Recent Labs Lab 02/14/14 0438 02/15/14 0928 02/16/14 0454 02/17/14 0535 02/18/14 0300 02/19/14 0521 02/20/14 0650  NA 134* 134* 136* 139 136* 136* 136*  K 4.9 4.9 4.7 4.4 4.5 4.4 4.4  CL 100 97 97 99 94* 93* 90*  CO2 21 26 28 30 30  33* 33*  GLUCOSE 183* 165* 148* 79 97 80 126*  BUN 50* 51* 55* 55* 56* 53* 51*  CREATININE 2.26* 2.44* 2.45* 2.30* 2.23* 2.08* 1.92*  CALCIUM 8.7 8.8 9.1 9.4 9.4 9.1 9.4  PHOS 3.7 3.9 4.0 3.8  3.3 3.6 3.3     Recent Labs Lab 02/17/14 0535 02/18/14 0300 02/19/14 0521  ALBUMIN 2.8* 2.8* 2.8*    Recent Labs Lab 02/14/14 0438 02/17/14 0535 02/18/14 0300  WBC 7.1 6.9 7.3  HGB 10.5* 10.8* 10.9*  HCT 33.7* 35.0* 35.1*  MCV 89.4 90.4 89.5  PLT 152 109* 119*      )  Recent Labs Lab 02/13/14 1111  TROPONINI <0.30     Recent Labs Lab 02/18/14 2158 02/19/14 0743 02/19/14 1155 02/19/14 1714 02/19/14 2214  GLUCAP 83 81 175* 161* 155*       Recent Labs Lab 02/14/14 0438  IRON 50  TIBC 292  FERRITIN 99    Studies/Results: No results found. Medications   . aspirin EC  81 mg Oral Daily  . atorvastatin  40 mg Oral QPM  . calcitRIOL  0.25 mcg Oral Daily  . calcium carbonate  1 tablet Oral TID WC  . vitamin B-12  500 mcg Oral Daily  . darbepoetin (ARANESP) injection - NON-DIALYSIS  60 mcg Subcutaneous Q Sat-1800  . docusate sodium  100 mg Oral BID  . ferrous sulfate  325 mg Oral Q breakfast  . furosemide  120 mg Oral BID  .  gabapentin  100 mg Oral TID  . insulin aspart  0-15 Units Subcutaneous TID WC  . insulin aspart  0-5 Units Subcutaneous QHS  . insulin aspart  5 Units Subcutaneous TID WC  . insulin glargine  15 Units Subcutaneous QHS  . metoprolol tartrate  25 mg Oral BID  . mometasone-formoterol  2 puff Inhalation BID  . nystatin   Topical BID  . omega-3 acid ethyl esters  1 g Oral TID  . polyethylene glycol  17 g Oral Daily  . rOPINIRole  0.5 mg Oral TID  . Warfarin - Pharmacist Dosing Inpatient   Does not apply q1800     ASSESSMENT/RECOMMENDATIONS  Assessment: Ms. Marcinek is 73 yo pleasant woman with pmh of HTN, HL, T2DM, CAD, COPD on home 2L O2, and CKD Stage IV who presented on 9/10 with dyspnea due to  volume overload found to have afib with RVR who we were consulted for in regards to AKI on CKD Stage IV with Cr peak of 2.45 with baseline of of 2.3.    Plan:  Nonoliguric AKI on CKD Stage IV - Cr down to 1.92 back to baseline of  2-2.3. Etiology thought to be due to cardiorenal syndrome in setting of cardiac demand form pulmonary HTN and new-onset Afib with RVR. Continue PO furosemide 120 mg BID on discharge with at least biweekly weight monitoring and renal function panel within 1 week at SNF. Continue to avoid nephrotoxins. Pt is unfortunately not a good future HD candidate. She is to follow-up with Dr. Lorrene Reid on October 5 at 9:30 AM (details in discharge summary). I spoke with sister over the phone today and she is aware of our plans. Dr. Lorrene Reid spoke with her sister over the phone as well.  Hypertension - Currently normotensive. Continue PO furosemide 120 mg BID and metoprolol tartrate 25 mg BID Secondary Hyperparathyroidism - Last PTH level 278, continue daily calcitriol 0.25 mcg daily  Anemia of Chronic Disease - Hg 10.9 near baseline on [facility] therapy. Completed IV ferric gluconate therapy. Continue PO ferrous sulfate 325 mg daily and weekly aranesp injections.  New onset Atrial Fibrillation - Currently tachycardic with irregular rhythm. Management per primary team. Continue coumadin per pharmacy for [facility] therapy, INR therapeutic. (2). Continue metoprolol tartrate 25 mg BID for rate control.    Fabio Neighbors Pager [phone] 02/20/2014 7:45 AM  I have seen and examined this patient and agree with plan as outlined in the above note.  I willsee her forrenalfollowupon Oct 5at9:30AM. We will contact [facility] to confirm appt and transportation. Kenon Delashmit B,MD 02/20/2014 9:34 AM

## 2014-02-20 NOTE — Progress Notes (Signed)
Patient wasn't discharged on 9/17 due to some medicine documentation at time of discharge, so this has been taking care of this morning, and the patient is being discharged this a.m.Marland Kitchen Patient was seen and examined, no significant events overnight.

## 2014-03-10 ENCOUNTER — Encounter: Payer: Self-pay | Admitting: Cardiology

## 2014-03-10 ENCOUNTER — Encounter: Payer: Medicare Other | Admitting: Cardiology

## 2014-03-10 NOTE — Progress Notes (Signed)
    ERROR  Arnoldo Lenis, M.D., F.A.C.C.

## 2014-04-20 ENCOUNTER — Encounter: Payer: Self-pay | Admitting: Neurology

## 2014-05-13 ENCOUNTER — Encounter (HOSPITAL_COMMUNITY): Payer: Self-pay | Admitting: Cardiovascular Disease

## 2014-06-11 ENCOUNTER — Ambulatory Visit: Payer: Medicare Other | Admitting: Nurse Practitioner

## 2014-07-10 ENCOUNTER — Encounter: Payer: Self-pay | Admitting: *Deleted

## 2014-07-10 ENCOUNTER — Telehealth: Payer: Self-pay | Admitting: *Deleted

## 2014-07-10 NOTE — Telephone Encounter (Signed)
Called patient at [phone] number was disconnected, called son gave me [phone] number also disconnected. Letter has been mailed to patient.

## 2014-07-23 ENCOUNTER — Ambulatory Visit: Payer: Medicare Other | Admitting: Neurology

## 2014-09-21 ENCOUNTER — Encounter: Payer: Self-pay | Admitting: *Deleted

## 2014-10-27 ENCOUNTER — Ambulatory Visit: Payer: Self-pay | Admitting: Neurology

## 2014-10-27 ENCOUNTER — Ambulatory Visit (INDEPENDENT_AMBULATORY_CARE_PROVIDER_SITE_OTHER): Payer: Medicare Other | Admitting: Nurse Practitioner

## 2014-10-27 ENCOUNTER — Encounter: Payer: Self-pay | Admitting: Nurse Practitioner

## 2014-10-27 VITALS — BP 144/78 | HR 80 | Ht 63.0 in | Wt 227.0 lb

## 2014-10-27 DIAGNOSIS — I63532 Cerebral infarction due to unspecified occlusion or stenosis of left posterior cerebral artery: Secondary | ICD-10-CM | POA: Diagnosis not present

## 2014-10-27 DIAGNOSIS — E785 Hyperlipidemia, unspecified: Secondary | ICD-10-CM

## 2014-10-27 DIAGNOSIS — E1165 Type 2 diabetes mellitus with hyperglycemia: Secondary | ICD-10-CM | POA: Diagnosis not present

## 2014-10-27 DIAGNOSIS — E1140 Type 2 diabetes mellitus with diabetic neuropathy, unspecified: Secondary | ICD-10-CM | POA: Diagnosis not present

## 2014-10-27 DIAGNOSIS — IMO0002 Reserved for concepts with insufficient information to code with codable children: Secondary | ICD-10-CM

## 2014-10-27 NOTE — Progress Notes (Signed)
I have read the note, and I agree with the clinical assessment and plan.  Keelon Zurn KEITH   

## 2014-10-27 NOTE — Patient Instructions (Signed)
Per skilled nursing sheet 

## 2014-10-27 NOTE — Progress Notes (Signed)
[name]  PATIENT: [name] DOB: 04/29/1941   REASON FOR VISIT: Follow-up for history of left occipital posterior cerebral artery stroke  HISTORY FROM: Patient and caregiver    HISTORY OF PRESENT ILLNESS:73 year obese Caucasian lady who noticed difficulty with peripheral vision on the right in mid-March as well as some slurred speech but did not seek medical help. A week later she was admitted with an episode of unresponsiveness at [facility] and diagnosed with hypoglycemia. 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 Carotid ultrasound showed 40-59% proximal right ICA stenosis. She was found to be in atrial fibrillation with rapid ventricular rate. She was started on Coumadin. She states she's done well she is living at home and getting home physical and occupational therapy. She still has some mild peripheral vision on the right. She is able to ambulate with a walker. She has not noticed any memory difficulties or speech problems. She denies any other prior history of strokes or TIAs. She does have significant vascular risk factors in the form of diabetes, heart disease, obesity.   UPDATE 12/11/13 (LL): Since last visit, she was readmitted to the hospital 10/30/13 for Acute on chronic diastolic CHF. She was diuresed with lasix and transitioned to torsemide. She was discharged to [name] [place] on 11/05/13 and was discharged home today. She lives with her son. She reports that she has home health therpaies that are scheduled to come to her house for PT and OT. Her diabetes is not well controlled, most recent hgb a1c was 9.9. She was discharged from the hospital on Lantus and SS Novolog, Blood pressure is well controlled, it is 121/73 in the office today. TCDs done after last visit showed diffuse atherosclrosis, most of the anterior  circulation were not isonated due to hyperostosis and limited temporal windows. 2D Echo EF was 60-65%; the right ventricular systolic pressure was increased consistent with mild pulmonary hypertension UPDATE 10/27/14 [name], 74 year old female returns for follow-up. She has a history of left occipital posterior cerebral artery infarct in March 2015. Likely due to atrial fibrillation she was started on Coumadin at that time. She is now living in a nursing facility since October of this past year. She is able to ambulate with a walker, she denies any further stroke or TIA symptoms. She denies any speech or memory difficulties. She has significant risk factors of diabetes, obesity, hypertension, heart disease. Her diabetes is not in good control with blood sugars in the 152-294 range. She is on a sliding scale. She is due for repeat carotid Doppler, labs showed 40-59% proximal right ICA stenosis. She returns for reevaluation  REVIEW OF SYSTEMS: Full 14 system review of systems performed and notable only for those listed, all others are neg:  Constitutional: neg  Cardiovascular: neg Ear/Nose/Throat: neg  Skin: neg Eyes: neg Respiratory: neg Gastroitestinal: neg  Hematology/Lymphatic: neg  Endocrine: neg Musculoskeletal: Gait abnormality Allergy/Immunology: neg Neurological: neg Psychiatric: neg Sleep : Restless legs   ALLERGIES: Allergies  Allergen Reactions  . Codeine Other (See Comments)    Childhood allergy    HOME MEDICATIONS: Outpatient Prescriptions Prior to Visit  Medication Sig Dispense Refill  . albuterol (PROVENTIL HFA;VENTOLIN HFA) 108 (90 BASE) MCG/ACT inhaler Inhale 2 puffs into the lungs every 6 (six) hours as needed for wheezing or shortness of breath.    Marland Kitchen albuterol (PROVENTIL) (2.5 MG/3ML)  0.083% nebulizer solution Take 2.5 mg by nebulization 4 (four) times daily.     Marland Kitchen aluminum & magnesium hydroxide-simethicone (MYLANTA) 500-450-40 MG/5ML suspension Take 15 mLs by  mouth every 2 (two) hours as needed for indigestion.    Marland Kitchen atorvastatin (LIPITOR) 40 MG tablet Take 40 mg by mouth daily.    . calcium-vitamin D (OSCAL WITH D) 500-200 MG-UNIT per tablet Take 1 tablet by mouth 3 (three) times daily.    . carvedilol (COREG) 25 MG tablet Take 1 tablet (25 mg total) by mouth 2 (two) times daily with a meal. 60 tablet 6  . Cholecalciferol (VITAMIN D-3) 1000 UNITS CAPS Take 1 capsule by mouth daily.    . cyanocobalamin 500 MCG tablet Take 1 tablet (500 mcg total) by mouth daily.    . diphenhydrAMINE (BENADRYL) 25 mg capsule Take 25 mg by mouth every 4 (four) hours as needed for itching.    . docusate sodium (COLACE) 100 MG capsule Take 100 mg by mouth every other day.    . ferrous sulfate 325 (65 FE) MG tablet Take 325 mg by mouth daily with breakfast.     . Fluticasone-Salmeterol (ADVAIR) 250-50 MCG/DOSE AEPB Inhale 1 puff into the lungs 2 (two) times daily.    . furosemide (LASIX) 40 MG tablet Take 3 tablets (120 mg total) by mouth 2 (two) times daily. 30 tablet 0  . gabapentin (NEURONTIN) 100 MG capsule Take 100 mg by mouth. 100mg  BID and 200mg  PM    . HYDROcodone-acetaminophen (NORCO/VICODIN) 5-325 MG per tablet Take 1 tablet by mouth every 6 (six) hours as needed for moderate pain. 30 tablet 0  . insulin aspart (NOVOLOG) 100 UNIT/ML injection Inject 5 Units into the skin 3 (three) times daily with meals. 10 mL 11  . insulin aspart (NOVOLOG) 100 UNIT/ML injection Inject 0-15 Units into the skin 3 (three) times daily with meals. 10 mL 11  . insulin aspart (NOVOLOG) 100 UNIT/ML injection Inject 0-5 Units into the skin at bedtime. 10 mL 11  . insulin glargine (LANTUS) 100 UNIT/ML injection Inject 0.15 mLs (15 Units total) into the skin at bedtime. 10 mL 11  . nitroGLYCERIN (NITROSTAT) 0.4 MG SL tablet Place 1 mg under the tongue every 5 (five) minutes x 3 doses as needed for chest pain. For chest pain    . Omega-3 Fatty Acids (FISH OIL) 1000 MG CAPS Take 1 capsule by  mouth 3 (three) times daily.    . polyethylene glycol (MIRALAX / GLYCOLAX) packet Take 17 g by mouth daily. 14 each 0  . rOPINIRole (REQUIP) 0.5 MG tablet Take 1 tablet (0.5 mg total) by mouth 3 (three) times daily.    . traMADol (ULTRAM) 50 MG tablet Take 1 tablet (50 mg total) by mouth every 6 (six) hours as needed for moderate pain. 30 tablet 0  . Calcium 500 MG tablet Take 500 mg by mouth 3 (three) times daily.    . vitamin B-12 (CYANOCOBALAMIN) 500 MCG tablet Take 500 mcg by mouth daily.    Marland Kitchen aspirin EC 81 MG tablet Take 81 mg by mouth daily.    . darbepoetin (ARANESP) 60 MCG/0.3ML SOLN injection Inject 0.3 mLs (60 mcg total) into the skin every Saturday at 6 PM. (Patient not taking: Reported on 10/27/2014) 4.2 mL 0  . nystatin (MYCOSTATIN/NYSTOP) 100000 UNIT/GM POWD Apply 1 g topically 2 (two) times daily. (Patient not taking: Reported on 10/27/2014)  0  . warfarin (COUMADIN) 5 MG tablet Take 7.5 mg by mouth  daily.     No facility-administered medications prior to visit.    PAST MEDICAL HISTORY: Past Medical History  Diagnosis Date  . Diabetes mellitus   . Hypertension   . Hyperlipemia   . Fibromyalgia   . Obesity   . Sleep apnea     No CPAP  . Anxiety   . Hiatal hernia   . NSTEMI (non-ST elevated myocardial infarction) 04/27/2011  . Anemia 04/27/2011  . Restless leg syndrome   . CAD (coronary artery disease)     small dominant right coronary artery with diffuse 95% stenosis. The LAD had 70% stenosis in the mid vessel beyond the diagonal branch but was a small vessel. Circumflex had proximal 30% stenosis.   Marland Kitchen External hemorrhoids   . Arrhythmia 10/11/2011  . Community acquired pneumonia 06/24/2012  . Renal insufficiency 05/06/2011  . Shingles   . CHF exacerbation 10/02/2013    PAST SURGICAL HISTORY: Past Surgical History  Procedure Laterality Date  . Appendectomy    . Fracture surgery      left humerous  . Ganglion cyst removal    . Left heart catheterization with  coronary angiogram N/A 05/01/2011    Procedure: LEFT HEART CATHETERIZATION WITH CORONARY ANGIOGRAM;  Surgeon: Burnell Blanks, MD;  Location: [facility] CATH LAB;  Service: Cardiovascular;  Laterality: N/A;    FAMILY HISTORY: Family History  Problem Relation Age of Onset  . Stomach cancer Mother   . Heart disease Maternal Grandmother   . Heart disease Maternal Grandfather   . Dementia Father     SOCIAL HISTORY: History   Social History  . Marital Status: Widowed    Spouse Name: N/A  . Number of Children: 4  . Years of Education: 12th   Occupational History  . Retired    Social History Main Topics  . Smoking status: Never Smoker   . Smokeless tobacco: Never Used  . Alcohol Use: No  . Drug Use: No  . Sexual Activity: No   Other Topics Concern  . Not on file   Social History Narrative   Patient lives at [facility] in [place], [place].   She is widowed with 4 children   Patient is right handed   Patient has high school education   Patient drinks 2 cups daily     PHYSICAL EXAM  Filed Vitals:   10/27/14 0900  BP: 144/78  Pulse: 80  Height: 5\' 3"  (1.6 m)  Weight: 227 lb (102.967 kg)   Body mass index is 40.22 kg/(m^2).  Generalized: Well developed, obese female in no acute distress  Head: normocephalic and atraumatic,. Oropharynx benign  Neck: Supple, no carotid bruits  Cardiac: Regular rate rhythm, no murmur  Musculoskeletal: No deformity   Neurological examination   Mentation: Alert oriented to time, place, history taking. Attention span and concentration appropriate. Recent and remote memory intact.  Follows all commands speech and language fluent.   Cranial nerve II-XII: Fundoscopic exam not performed .Pupils were equal round reactive to light extraocular movements were full, visual field were full on confrontational test. Facial sensation and strength were normal. hearing was intact to finger rubbing bilaterally. Uvula tongue midline. head turning and shoulder  shrug were normal and symmetric.Tongue protrusion into cheek strength was normal. Motor: normal bulk and tone, full strength in the BUE, BLE, fine finger movements normal, no pronator drift. No focal weakness Sensory: Intact to touch and pinprick, diminished vibratory and position sense over toes bilaterally   Coordination: finger-nose-finger, heel-to-shin bilaterally, no dysmetria  Reflexes: 1+ upper lower and symmetric , plantar responses were flexor bilaterally. Gait and Station: Not ambulated in wheelchair   DIAGNOSTIC DATA (LABS, IMAGING, TESTING) - ASSESSMENT AND PLAN  74 y.o. year old female  has a past medical history of Diabetes mellitus; Hypertension; Hyperlipemia;  Obesity; Sleep apnea; Anxiety;  NSTEMI (non-ST elevated myocardial infarction) (04/27/2011); Anemia (04/27/2011); Restless leg syndrome; CAD (coronary artery disease); Renal insufficiency (05/06/2011); Shingles; and CHF exacerbation (10/02/2013). History of left occipital infarct 08/22/2013 likely from atrial fibrillation. Multiple risk factors of insulin-dependent diabetes, heart disease, hypertension, hyperlipidemia, obesity. The patient is a current patient of Dr. Leonie Man  who is out of the office today . This note is sent to the work in doctor.     Continue Coumadin for secondary stroke prevention and atrial fibrillation Strict control of diabetes with hemoglobin A1c goal below 6.5 LDL goal below 70 Hypertension goal below 130/90 today's reading 144/78 Chair exercises and nutritious diet for overall health and well-being Repeat carotid Doppler study, caregiver look to see if it can be done at [place] in 6-8 months [facility], [name], [name], [name] [address] [place],  [name] [phone]

## 2014-11-10 ENCOUNTER — Ambulatory Visit (INDEPENDENT_AMBULATORY_CARE_PROVIDER_SITE_OTHER): Payer: Medicare Other

## 2014-11-10 DIAGNOSIS — I63532 Cerebral infarction due to unspecified occlusion or stenosis of left posterior cerebral artery: Secondary | ICD-10-CM | POA: Diagnosis not present

## 2014-11-14 IMAGING — CT CT CHEST W/O CM
2 of 3 series · 15 of 36 positions shown, 18 images · non-contrast
Comparison: Chest x-ray from earlier in the same day

CLINICAL DATA: Abnormal chest x-ray

EXAM:
CT CHEST WITHOUT CONTRAST
TECHNIQUE: Multidetector CT imaging of the chest was performed following the
standard protocol without IV contrast..

[Series 2: thorax 5.0 i31f 1 · axial · 0.71mm/px · z∈[+1204,+1408]mm · 12 of 49 slices shown, 15 images]
[im 4/49  mediastinal]
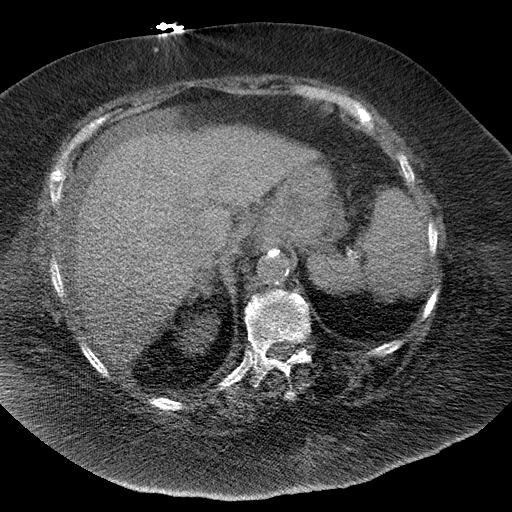
[im 4/49  lung]
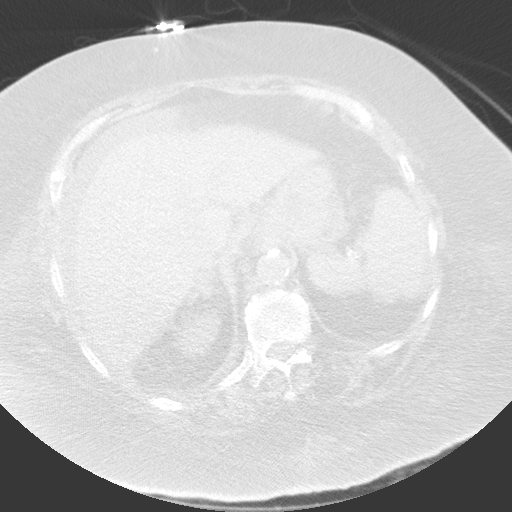
[im 8/49  lung]
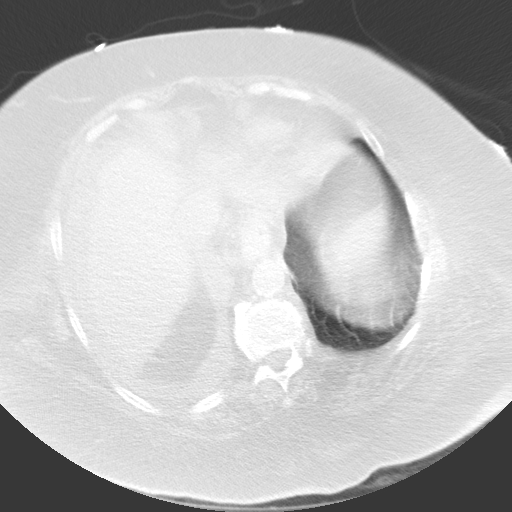
[im 11/49  lung]
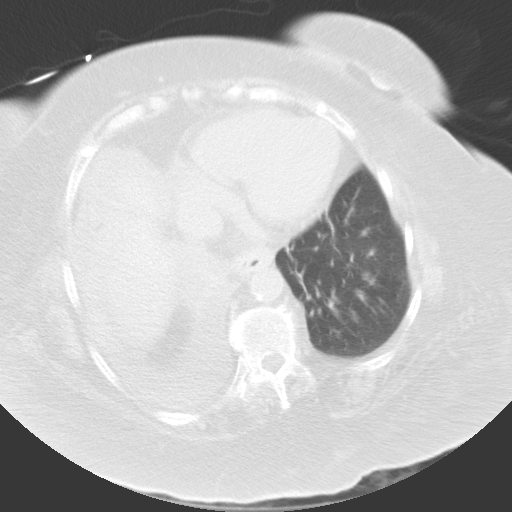
[im 15/49  lung]
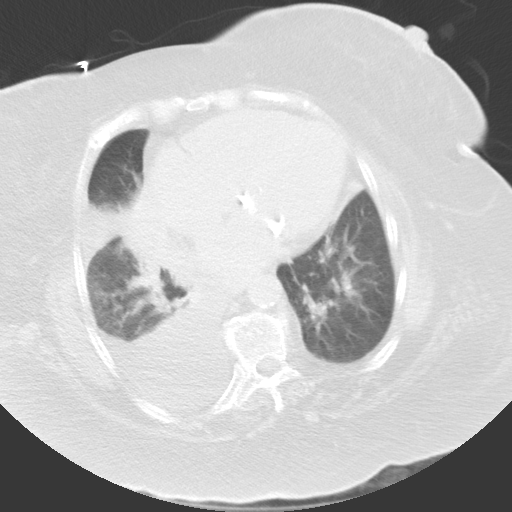
[im 18/49  mediastinal]
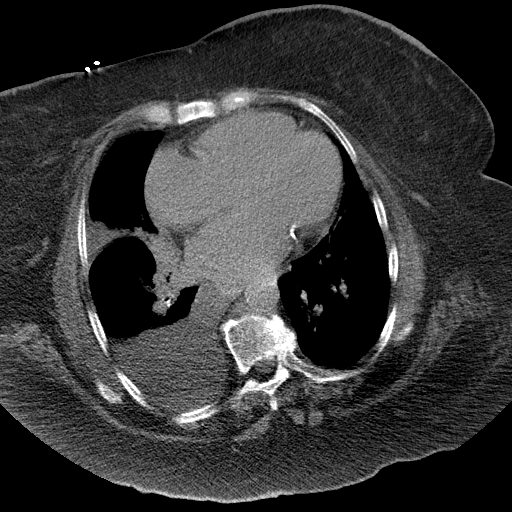
[im 18/49  lung]
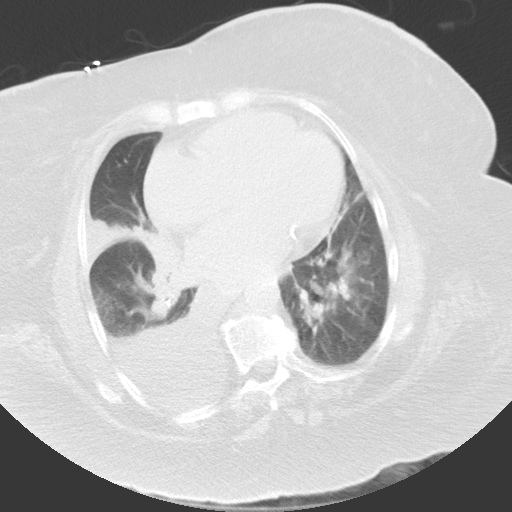
[im 22/49  lung]
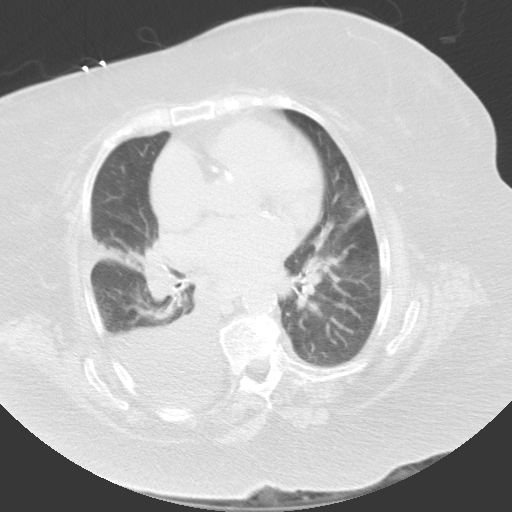
[im 27/49  lung]
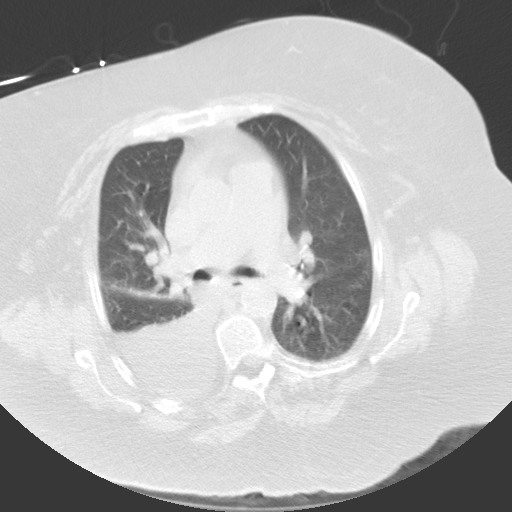
[im 31/49  lung]
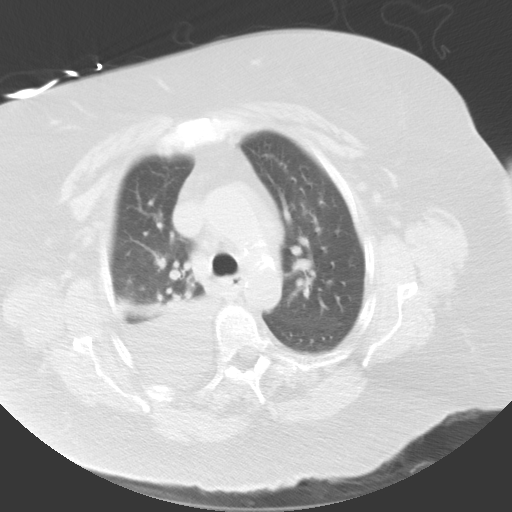
[im 34/49  mediastinal]
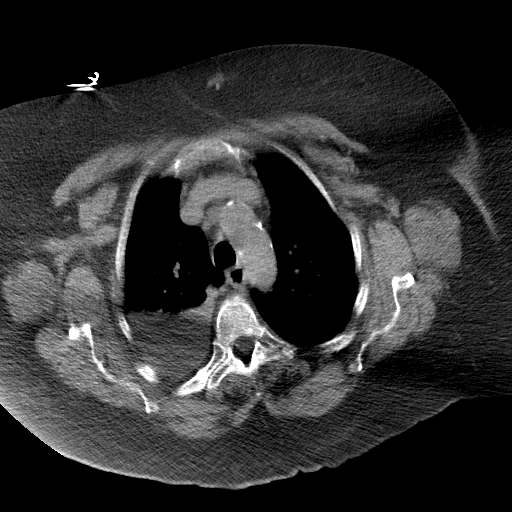
[im 34/49  lung]
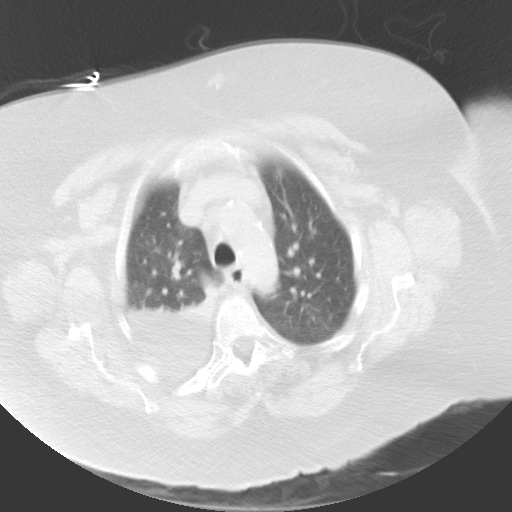
[im 38/49  lung]
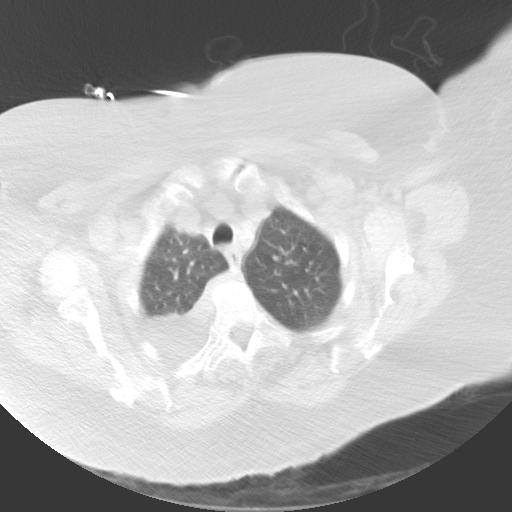
[im 41/49  lung]
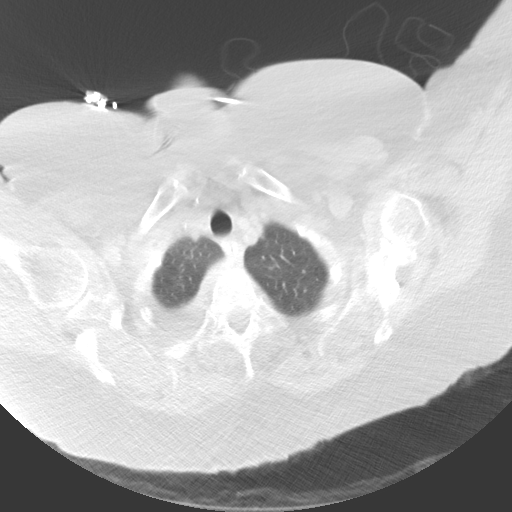
[im 45/49  lung]
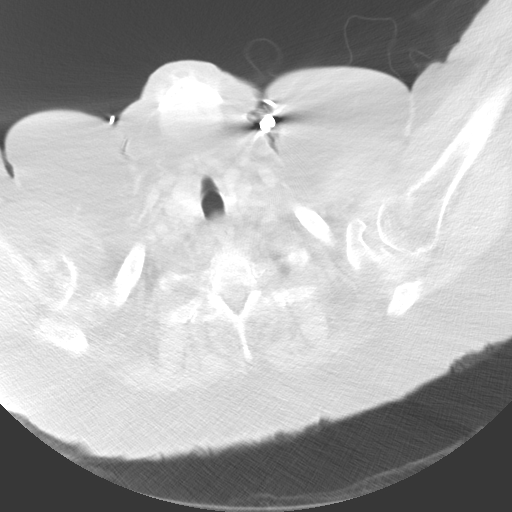

[Series 5: coronal · coronal · 0.50mm/px · 3 of 101 slices shown]
[im 21/101  lung]
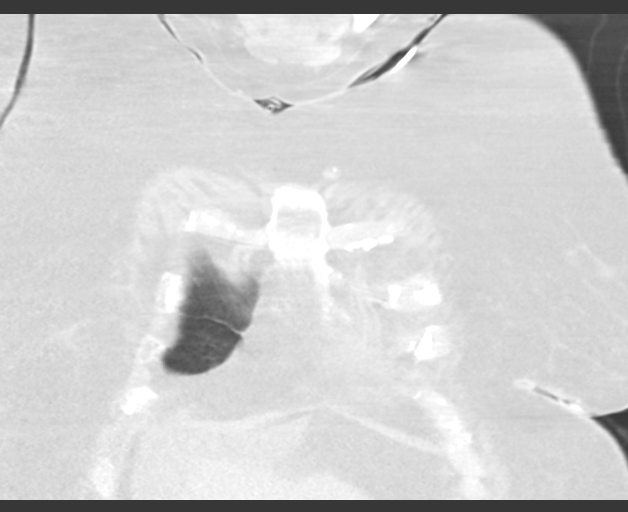
[im 41/101  lung]
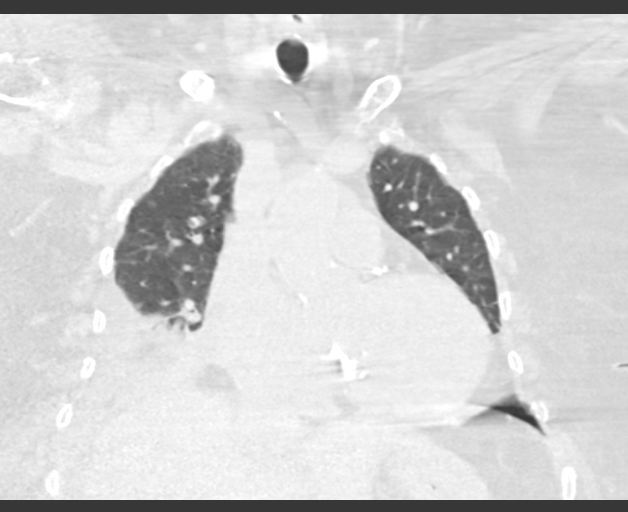
[im 61/101  lung]
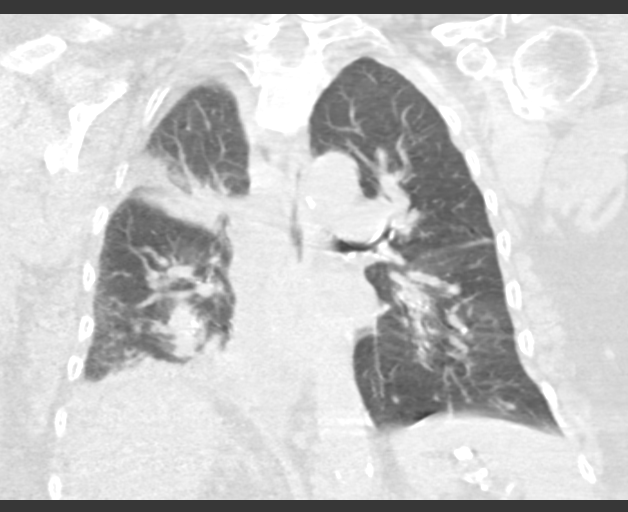

[15 of 36 positions shown; findings below may reference images not displayed]

FINDINGS: The left lung is well aerated without focal infiltrate or sizable
effusion. A large pleural effusion is noted on the right. Associated
basilar infiltrate is noted. No definitive mass lesion is seen
although it would be difficult to exclude a mass within the
consolidated right lower lobe. No definitive hilar or mediastinal
adenopathy is seen. Aortic calcifications are noted. Heavy coronary
calcifications are seen. Visualized upper abdomen shows a vague
hypodensity within the right lobe of the liver. Ultrasound may be
helpful for further evaluation. The lack of IV contrast limits this
exam. Small amount of ascites is seen. No acute bony abnormality is
seen.
IMPRESSION: Large right-sided pleural effusion with right lower lobe
consolidation. No definitive mass lesion is noted although a small
lesion could not be totally excluded on the basis of this exam.

Vague hypodensity within the in the right lobe of the liver
laterally. Ultrasound may be helpful for further evaluation.

## 2015-04-06 DEATH — deceased

## 2015-05-03 ENCOUNTER — Ambulatory Visit: Payer: Medicare Other | Admitting: Nurse Practitioner
